# Patient Record
Sex: Male | Born: 1935
Health system: Southern US, Community
[De-identification: ages and names within clinical notes are randomized; demographics above are authoritative.]

## PROBLEM LIST (undated history)

## (undated) DIAGNOSIS — E079 Disorder of thyroid, unspecified: Secondary | ICD-10-CM

## (undated) DIAGNOSIS — N183 Chronic kidney disease, stage 3 (moderate): Secondary | ICD-10-CM

## (undated) DIAGNOSIS — E871 Hypo-osmolality and hyponatremia: Secondary | ICD-10-CM

## (undated) DIAGNOSIS — G20C Parkinsonism, unspecified: Secondary | ICD-10-CM

## (undated) DIAGNOSIS — I639 Cerebral infarction, unspecified: Secondary | ICD-10-CM

## (undated) DIAGNOSIS — M5430 Sciatica, unspecified side: Secondary | ICD-10-CM

## (undated) DIAGNOSIS — K219 Gastro-esophageal reflux disease without esophagitis: Secondary | ICD-10-CM

## (undated) DIAGNOSIS — F0391 Unspecified dementia with behavioral disturbance: Secondary | ICD-10-CM

## (undated) DIAGNOSIS — N189 Chronic kidney disease, unspecified: Secondary | ICD-10-CM

## (undated) DIAGNOSIS — L570 Actinic keratosis: Secondary | ICD-10-CM

## (undated) DIAGNOSIS — G20 Parkinson's disease: Secondary | ICD-10-CM

## (undated) DIAGNOSIS — R609 Edema, unspecified: Secondary | ICD-10-CM

## (undated) DIAGNOSIS — Z8719 Personal history of other diseases of the digestive system: Secondary | ICD-10-CM

## (undated) DIAGNOSIS — F329 Major depressive disorder, single episode, unspecified: Secondary | ICD-10-CM

## (undated) DIAGNOSIS — R269 Unspecified abnormalities of gait and mobility: Secondary | ICD-10-CM

## (undated) DIAGNOSIS — D649 Anemia, unspecified: Secondary | ICD-10-CM

## (undated) DIAGNOSIS — K5790 Diverticulosis of intestine, part unspecified, without perforation or abscess without bleeding: Secondary | ICD-10-CM

## (undated) DIAGNOSIS — E559 Vitamin D deficiency, unspecified: Secondary | ICD-10-CM

## (undated) DIAGNOSIS — F419 Anxiety disorder, unspecified: Secondary | ICD-10-CM

## (undated) DIAGNOSIS — M549 Dorsalgia, unspecified: Secondary | ICD-10-CM

## (undated) DIAGNOSIS — I10 Essential (primary) hypertension: Secondary | ICD-10-CM

## (undated) DIAGNOSIS — R202 Paresthesia of skin: Secondary | ICD-10-CM

## (undated) DIAGNOSIS — Q446 Cystic disease of liver: Secondary | ICD-10-CM

## (undated) DIAGNOSIS — I493 Ventricular premature depolarization: Secondary | ICD-10-CM

## (undated) HISTORY — DX: Parkinson's disease: G20

## (undated) HISTORY — DX: Parkinsonism, unspecified: G20.C

## (undated) HISTORY — PX: PROSTATE SURGERY: SHX751

## (undated) HISTORY — DX: Essential (primary) hypertension: I10

## (undated) HISTORY — DX: Ventricular premature depolarization: I49.3

## (undated) HISTORY — DX: Edema, unspecified: R60.9

## (undated) HISTORY — DX: Unspecified dementia with behavioral disturbance: F03.91

## (undated) HISTORY — DX: Cystic disease of liver: Q44.6

## (undated) HISTORY — DX: Anxiety disorder, unspecified: F41.9

## (undated) HISTORY — DX: Unspecified abnormalities of gait and mobility: R26.9

## (undated) HISTORY — PX: APPENDECTOMY: SHX54

## (undated) HISTORY — DX: Major depressive disorder, single episode, unspecified: F32.9

## (undated) HISTORY — DX: Actinic keratosis: L57.0

## (undated) HISTORY — DX: Hypo-osmolality and hyponatremia: E87.1

## (undated) HISTORY — DX: Diverticulosis of intestine, part unspecified, without perforation or abscess without bleeding: K57.90

## (undated) HISTORY — DX: Cerebral infarction, unspecified: I63.9

## (undated) HISTORY — DX: Anemia, unspecified: D64.9

## (undated) HISTORY — DX: Sciatica, unspecified side: M54.30

## (undated) HISTORY — DX: Disorder of thyroid, unspecified: E07.9

## (undated) HISTORY — DX: Gastro-esophageal reflux disease without esophagitis: K21.9

## (undated) HISTORY — PX: HERNIA REPAIR: SHX51

## (undated) HISTORY — DX: Chronic kidney disease, stage 3 (moderate): N18.3

## (undated) HISTORY — DX: Paresthesia of skin: R20.2

## (undated) HISTORY — DX: Vitamin D deficiency, unspecified: E55.9

## (undated) HISTORY — DX: Dorsalgia, unspecified: M54.9

---

## 2002-12-03 ENCOUNTER — Emergency Department (HOSPITAL_COMMUNITY): Admission: EM | Admit: 2002-12-03 | Discharge: 2002-12-03 | Payer: Self-pay | Admitting: Emergency Medicine

## 2002-12-03 ENCOUNTER — Encounter: Payer: Self-pay | Admitting: Emergency Medicine

## 2006-03-01 DIAGNOSIS — Z8719 Personal history of other diseases of the digestive system: Secondary | ICD-10-CM

## 2006-03-01 HISTORY — DX: Personal history of other diseases of the digestive system: Z87.19

## 2007-09-07 ENCOUNTER — Encounter: Admission: RE | Admit: 2007-09-07 | Discharge: 2007-09-07 | Payer: Self-pay | Admitting: General Surgery

## 2007-09-08 ENCOUNTER — Encounter: Admission: RE | Admit: 2007-09-08 | Discharge: 2007-09-08 | Payer: Self-pay | Admitting: General Surgery

## 2007-09-11 ENCOUNTER — Ambulatory Visit (HOSPITAL_BASED_OUTPATIENT_CLINIC_OR_DEPARTMENT_OTHER): Admission: RE | Admit: 2007-09-11 | Discharge: 2007-09-11 | Payer: Self-pay | Admitting: General Surgery

## 2008-03-01 DIAGNOSIS — I639 Cerebral infarction, unspecified: Secondary | ICD-10-CM

## 2008-03-01 HISTORY — DX: Cerebral infarction, unspecified: I63.9

## 2008-03-27 ENCOUNTER — Encounter: Admission: RE | Admit: 2008-03-27 | Discharge: 2008-03-27 | Payer: Self-pay | Admitting: Internal Medicine

## 2008-07-09 ENCOUNTER — Encounter: Admission: RE | Admit: 2008-07-09 | Discharge: 2008-07-09 | Payer: Self-pay | Admitting: Internal Medicine

## 2008-08-16 ENCOUNTER — Encounter: Admission: RE | Admit: 2008-08-16 | Discharge: 2008-08-16 | Payer: Self-pay | Admitting: Internal Medicine

## 2009-02-21 ENCOUNTER — Emergency Department (HOSPITAL_COMMUNITY): Admission: EM | Admit: 2009-02-21 | Discharge: 2009-02-21 | Payer: Self-pay | Admitting: Emergency Medicine

## 2010-03-23 ENCOUNTER — Encounter: Payer: Self-pay | Admitting: Internal Medicine

## 2010-07-14 NOTE — Op Note (Signed)
NAME:  STILLMAN, BUENGER NO.:  [id number]   MEDICAL RECORD NO.:  [id number]          PATIENT TYPE:  AMB   LOCATION:  DSC                          FACILITY:  [facility]   PHYSICIAN:  Angelia Mould. Derrell Lolling, M.D.DATE OF BIRTH:  06-11-1924   DATE OF PROCEDURE:  09/11/2007  DATE OF DISCHARGE:                               OPERATIVE REPORT   PREOPERATIVE DIAGNOSIS:  Left inguinal hernia.   POSTOPERATIVE DIAGNOSIS:  Left inguinal hernia.   OPERATION PERFORMED:  Repair of left inguinal hernia with mesh  Armanda Heritage repair).   SURGEON:  Dr. Claud Kelp.   OPERATIVE INDICATIONS:  This is an 77 year old white male who is  independent and lives at friend's home at [place].  He has a 35-month  history of a left groin pain and bulge.  This has been progressive.  On  exam, he has a moderate-size left inguinal hernia that is completely  reducible.  There is no evidence of hernia on the right.  There is a  scar in the right groin from previous hernia repair.  He was brought to  the operating room electively.   OPERATIVE TECHNIQUE:  Following induction of general LMA anesthesia the  patient's left groin was prepped and draped in sterile fashion.  The  patient was identified as to correct patient, correct procedure, and  correct site.  Intravenous antibiotics were given.  Marcaine 0.5% with  epinephrine local was used as a local infiltration anesthetic.   A slightly oblique transverse incision was made in the left inguinal  area overlying the left inguinal canal.  Dissection was carried down  through subcutaneous tissue exposing the aponeurosis of the external  oblique.  The external oblique was incised in the direction of its  fibers, opening up the external inguinal ring.  The external oblique was  dissected away from the underlying structures and self-retaining  retractors were placed.  A single sensory nerve was found to be  associated with the cord structures.  This nerve was  traced back to its  emergence from the muscle lateral to the internal ring, clamped,  divided, and ligated with 2-0 silk ties.  The redundant nerve medially  was excised.  The cord structures were mobilized and encircled with a  Penrose drain.  Cremasteric muscle fibers were skeletonized  circumferentially.   I found that he had a large indirect hernia.  There was a little bit of  omentum in the sac but it easily reduced.  He also had somewhat of a  bulge in the medial to the inferior epigastric vessels suggesting that  the also had a small component of a direct hernia.   The indirect sac was dissected back to the level of the internal  inguinal ring.  The sac was then twisted and it suture ligated at the  level of the internal ring with a suture ligature of 2-0 Vicryl.  The  redundant sac was excised.   The floor of the inguinal canal was repaired, reinforced with an [place]  graft of Ultrapro mesh.  A 3 inches x 6 inches piece of Ultrapro  mesh  was used, trimmed at the corners, and sutured in place with running  sutures and interrupted sutures of 2-0 Prolene.  The mesh was sutured so  as to generously overlap the fascia at the pubic tubercle, along the  inguinal ligament inferiorly.  Medially and superomedially about 6  interrupted sutures of 2-0 Prolene were placed to secure the mesh.  Superolaterally, a running suture of 2-0 Prolene was used.  The mesh was  incised laterally so as to wrap around the cord structures of the  internal ring.  Laterally, the tails of the mesh were overlapped and the  suture line was completed.  One of the mattress suture of 2-0 Prolene  was placed just lateral to the cord structures to tighten up the repair  around the internal ring.  This provided a very secure repair both  medial and lateral to the cord structures, but allowed a fingertip  opening for the cord structures to emerge.  The wound was irrigated with  saline.  The external oblique was closed  with running suture of 2-0  Vicryl placing the cord structures deep to the external oblique.  Scarpa  fascia was closed with 3-0 Vicryl suture and the skin was closed with  running subcuticular suture of 4-0 Monocryl and Dermabond.  Clean  bandages were placed and the patient was taken recovery room in stable  condition.  Estimated blood loss was about 10 mL.  Complications none.  Sponge, needle, and instrument counts were correct.      Angelia Mould. Derrell Lolling, M.D.  Electronically Signed     HMI/MEDQ  D:  09/11/2007  T:  09/12/2007  Job:  [id number]   cc:   Lenon Curt Chilton Si, M.D.

## 2010-11-10 ENCOUNTER — Encounter (INDEPENDENT_AMBULATORY_CARE_PROVIDER_SITE_OTHER): Payer: Self-pay | Admitting: General Surgery

## 2010-11-12 ENCOUNTER — Ambulatory Visit (INDEPENDENT_AMBULATORY_CARE_PROVIDER_SITE_OTHER): Payer: Medicare Other | Admitting: General Surgery

## 2010-11-12 ENCOUNTER — Encounter (INDEPENDENT_AMBULATORY_CARE_PROVIDER_SITE_OTHER): Payer: Self-pay | Admitting: General Surgery

## 2010-11-12 VITALS — BP 132/72 | HR 88 | Temp 97.7°F | Ht 63.0 in | Wt 126.4 lb

## 2010-11-12 DIAGNOSIS — R1031 Right lower quadrant pain: Secondary | ICD-10-CM

## 2010-11-12 NOTE — Progress Notes (Signed)
Chief Complaint  Patient presents with  . Other    new pt -eval of right inguinal hernia    HPI Vincent Potts is a 77 y.o. male HPI The patient is referred by Dr. Chilton Si for evaluation of right-sided groin pain. He has a history of bilateral inguinal hernia repair as well as an open appendectomy. He had a right-sided hernia repair many years ago and is unsure if any mesh was included in the repair. The last 2-3 weeks he has noted some occasional discomfort in the right groin which he describes as "sharp, and burning". He states it is worse when going to the bathroom.He has not noticed any bulge in the region and states that his bowels are okay. He did have a colonoscopy 4-5 years ago which he states was normal. Past Medical History  Diagnosis Date  . Thyroid disease   . Anemia   . Vitamin D deficiency disease   . Hyponatremia   . Anxiety   . Parkinsonism   . Hearing loss   . Hypertension   . PVC (premature ventricular contraction)   . CVA (cerebral infarction)   . GERD (gastroesophageal reflux disease)   . Diverticulosis   . Sciatica   . Back pain   . Gait disorder   . Cataract   . Edema   . Keratosis   . Sciatica   . Cystic disease of liver   . Paresthesia   . Inguinal hernia   . Hearing loss     Past Surgical History  Procedure Date  . Prostate surgery     cancer  . Hernia repair   . Appendectomy     History reviewed. No pertinent family history.  Social History History  Substance Use Topics  . Smoking status: Never Smoker   . Smokeless tobacco: Not on file  . Alcohol Use: No    Allergies  Allergen Reactions  . Rivastigmine Nausea Only  . Zyprexa     Current Outpatient Prescriptions  Medication Sig Dispense Refill  . amLODipine (NORVASC) 5 MG tablet Take 5 mg by mouth daily.        Marland Kitchen aspirin 325 MG tablet Take 325 mg by mouth daily.        . cholecalciferol (VITAMIN D) 1000 UNITS tablet Take 1,000 Units by mouth daily.        . cycloSPORINE  (RESTASIS) 0.05 % ophthalmic emulsion 1 drop 2 (two) times daily.        . ferrous sulfate 325 (65 FE) MG tablet Take 325 mg by mouth daily with breakfast.        . lisinopril (PRINIVIL,ZESTRIL) 10 MG tablet Take 10 mg by mouth daily.        Marland Kitchen omeprazole (PRILOSEC) 20 MG capsule Take 20 mg by mouth daily.        Marland Kitchen senna (SENOKOT) 8.6 MG tablet Take 1 tablet by mouth daily.        . Tamsulosin HCl (FLOMAX) 0.4 MG CAPS Take 0.4 mg by mouth.        . temazepam (RESTORIL) 30 MG capsule Take 30 mg by mouth at bedtime as needed.          Review of Systems Review of Systems  HENT: Positive for hearing loss.   All other systems reviewed and are negative.    Blood pressure 132/72, pulse 88, temperature 97.7 F (36.5 C), height 5\' 3"  (1.6 m), weight 126 lb 6 oz (57.323 kg).  Physical Exam Physical Exam  Constitutional: He is oriented to person, place, and time. He appears well-developed and well-nourished. No distress.  HENT:  Head: Normocephalic and atraumatic.  Eyes: Conjunctivae and EOM are normal. Pupils are equal, round, and reactive to light. Right eye exhibits no discharge. Left eye exhibits no discharge. No scleral icterus.  Neck: Normal range of motion. Neck supple. No tracheal deviation present.  Cardiovascular: Normal rate, regular rhythm and normal heart sounds.   Pulmonary/Chest: Effort normal and breath sounds normal. No stridor. No respiratory distress. He has no wheezes.  Abdominal: Soft. Bowel sounds are normal. He exhibits no distension and no mass. There is no tenderness. There is no rebound and no guarding.       bilat inguinal hernia incisions, lower midline incision without evidence of hernia   Genitourinary: Penis normal.       He has well-healed surgical scars in both groins but I did not feel any evidence of hernia with Valsalva bilaterally. He does have some tenderness in the epididymis region and his cord but again no evidence of hernia or testicular masses.    Musculoskeletal: Normal range of motion.  Neurological: He is alert and oriented to person, place, and time.  Skin: Skin is warm and dry. No rash noted. He is not diaphoretic. No erythema. No pallor.  Psychiatric: He has a normal mood and affect. His behavior is normal. Judgment and thought content normal.    Data Reviewed   Assessment    Right groin pain  I do not appreciate on exam any evidence of recurrent hernia bilaterally. He does have pain in the area which could be consistent with a recurrent inguinal hernia, however, I cannot feel any bulge or defect with Valsalva. He does have some tenderness in the epididymal region and this could be testicular or epididymitis as well. I have recommended ultrasound of the groin to evaluate for possible hernia but I would be reluctant to offer any surgical treatment without more definitive evidence of a hernia.    Plan    He will complete this ultrasound of the right groin to evaluate for possible hernia and he will follow up after this. If he has a hernia on ultrasound, and then we will discuss possible surgical intervention but given his age and functional status, I would be reluctant to offer any surgery in this there is better evidence indicating a hernia.       Lodema Pilot DAVID 11/12/2010, 12:06 PM

## 2010-11-16 ENCOUNTER — Other Ambulatory Visit (INDEPENDENT_AMBULATORY_CARE_PROVIDER_SITE_OTHER): Payer: Self-pay | Admitting: General Surgery

## 2010-11-16 ENCOUNTER — Ambulatory Visit
Admission: RE | Admit: 2010-11-16 | Discharge: 2010-11-16 | Disposition: A | Discharge status: Home / Self Care | Payer: Medicare Other | Source: Ambulatory Visit | Attending: General Surgery | Admitting: General Surgery

## 2010-11-16 DIAGNOSIS — R1031 Right lower quadrant pain: Secondary | ICD-10-CM

## 2010-11-19 ENCOUNTER — Ambulatory Visit (INDEPENDENT_AMBULATORY_CARE_PROVIDER_SITE_OTHER): Payer: Medicare Other | Admitting: General Surgery

## 2010-11-19 ENCOUNTER — Encounter (INDEPENDENT_AMBULATORY_CARE_PROVIDER_SITE_OTHER): Payer: Self-pay | Admitting: General Surgery

## 2010-11-19 VITALS — BP 144/84 | HR 88 | Temp 98.6°F | Resp 16 | Ht 62.5 in | Wt 129.4 lb

## 2010-11-19 DIAGNOSIS — R1031 Right lower quadrant pain: Secondary | ICD-10-CM

## 2010-11-19 NOTE — Progress Notes (Signed)
Subjective:     Patient ID: Vincent Potts, male   DOB: 15-Jun-1924, 77 y.o.   MRN: [id number]  HPI Patient follows up with ultrasound of the right groin for evaluation of possible right inguinal hernia. He states that he still has occasional discomfort in the groin but this is relieved with Tylenol twice a day. Ultrasound was negative for any abdominal defects.  Review of Systems     Objective:   Physical Exam No acute distress and nontoxic-appearing  His abdomen is soft and nontender on exam. I reexamined him today and I again did not feel any evidence of hernia in the groin or at the bottom of his lower midline incision. There is no bulge with Valsalva. It is any apparent tenderness on exam today.at    Assessment:     Right groin pain  I do not see any evidence of hernia.  I would not recommend any surgical intervention at this time. This is not limiting his lifestyle and without any objective evidence confirming the hernia I would not recommend surgical intervention.    Plan:     If he notices a bulge in the area, I recommended that he return for evaluation so we can confirm the presence of a hernia. If he returns with physical exam consistent with a hernia then we will be happy to reconsider surgical intervention.

## 2010-11-26 LAB — DIFFERENTIAL
Basophils Absolute: 0
Basophils Relative: 0
Eosinophils Relative: 2
Lymphocytes Relative: 25
Monocytes Absolute: 0.6
Neutro Abs: 7.4

## 2010-11-26 LAB — CBC
HCT: 37.7 — ABNORMAL LOW
MCV: 91.5
RBC: 4.12 — ABNORMAL LOW
WBC: 10.9 — ABNORMAL HIGH

## 2010-11-26 LAB — COMPREHENSIVE METABOLIC PANEL
AST: 23
Albumin: 3.8
Alkaline Phosphatase: 63
BUN: 18
CO2: 28
Chloride: 94 — ABNORMAL LOW
Creatinine, Ser: 1.23
GFR calc non Af Amer: 56 — ABNORMAL LOW
Potassium: 4.3
Total Bilirubin: 0.8

## 2010-11-26 LAB — POCT HEMOGLOBIN-HEMACUE: Hemoglobin: 13.3

## 2010-11-26 LAB — URINALYSIS, ROUTINE W REFLEX MICROSCOPIC
Glucose, UA: NEGATIVE
Hgb urine dipstick: NEGATIVE
Specific Gravity, Urine: 1.015
pH: 7

## 2011-03-02 DIAGNOSIS — N189 Chronic kidney disease, unspecified: Secondary | ICD-10-CM

## 2011-03-02 HISTORY — DX: Chronic kidney disease, unspecified: N18.9

## 2011-03-29 DIAGNOSIS — D649 Anemia, unspecified: Secondary | ICD-10-CM | POA: Diagnosis not present

## 2011-03-29 DIAGNOSIS — I10 Essential (primary) hypertension: Secondary | ICD-10-CM | POA: Diagnosis not present

## 2011-04-06 DIAGNOSIS — K4090 Unilateral inguinal hernia, without obstruction or gangrene, not specified as recurrent: Secondary | ICD-10-CM | POA: Diagnosis not present

## 2011-04-06 DIAGNOSIS — N401 Enlarged prostate with lower urinary tract symptoms: Secondary | ICD-10-CM | POA: Diagnosis not present

## 2011-04-06 DIAGNOSIS — R21 Rash and other nonspecific skin eruption: Secondary | ICD-10-CM | POA: Diagnosis not present

## 2011-04-06 DIAGNOSIS — R351 Nocturia: Secondary | ICD-10-CM | POA: Diagnosis not present

## 2011-04-08 DIAGNOSIS — R351 Nocturia: Secondary | ICD-10-CM | POA: Diagnosis not present

## 2011-04-08 DIAGNOSIS — N401 Enlarged prostate with lower urinary tract symptoms: Secondary | ICD-10-CM | POA: Diagnosis not present

## 2011-04-08 DIAGNOSIS — R350 Frequency of micturition: Secondary | ICD-10-CM | POA: Diagnosis not present

## 2011-04-18 ENCOUNTER — Encounter (HOSPITAL_COMMUNITY): Payer: Self-pay

## 2011-04-18 ENCOUNTER — Other Ambulatory Visit: Payer: Self-pay

## 2011-04-18 ENCOUNTER — Emergency Department (HOSPITAL_COMMUNITY): Payer: Medicare Other

## 2011-04-18 ENCOUNTER — Inpatient Hospital Stay (HOSPITAL_COMMUNITY)
Admission: EM | Admit: 2011-04-18 | Discharge: 2011-04-23 | DRG: 557 | Disposition: A | Discharge status: Skilled Nursing Facility | Payer: Medicare Other | Attending: Internal Medicine | Admitting: Internal Medicine

## 2011-04-18 DIAGNOSIS — M6282 Rhabdomyolysis: Principal | ICD-10-CM | POA: Diagnosis present

## 2011-04-18 DIAGNOSIS — R748 Abnormal levels of other serum enzymes: Secondary | ICD-10-CM | POA: Diagnosis present

## 2011-04-18 DIAGNOSIS — E46 Unspecified protein-calorie malnutrition: Secondary | ICD-10-CM | POA: Diagnosis present

## 2011-04-18 DIAGNOSIS — W010XXA Fall on same level from slipping, tripping and stumbling without subsequent striking against object, initial encounter: Secondary | ICD-10-CM | POA: Diagnosis present

## 2011-04-18 DIAGNOSIS — N179 Acute kidney failure, unspecified: Secondary | ICD-10-CM | POA: Diagnosis present

## 2011-04-18 DIAGNOSIS — D649 Anemia, unspecified: Secondary | ICD-10-CM

## 2011-04-18 DIAGNOSIS — G20A1 Parkinson's disease without dyskinesia, without mention of fluctuations: Secondary | ICD-10-CM | POA: Diagnosis present

## 2011-04-18 DIAGNOSIS — H9190 Unspecified hearing loss, unspecified ear: Secondary | ICD-10-CM | POA: Diagnosis present

## 2011-04-18 DIAGNOSIS — R4182 Altered mental status, unspecified: Secondary | ICD-10-CM | POA: Diagnosis not present

## 2011-04-18 DIAGNOSIS — M549 Dorsalgia, unspecified: Secondary | ICD-10-CM

## 2011-04-18 DIAGNOSIS — I6350 Cerebral infarction due to unspecified occlusion or stenosis of unspecified cerebral artery: Secondary | ICD-10-CM | POA: Diagnosis not present

## 2011-04-18 DIAGNOSIS — Z7982 Long term (current) use of aspirin: Secondary | ICD-10-CM

## 2011-04-18 DIAGNOSIS — E876 Hypokalemia: Secondary | ICD-10-CM | POA: Diagnosis present

## 2011-04-18 DIAGNOSIS — Z8719 Personal history of other diseases of the digestive system: Secondary | ICD-10-CM

## 2011-04-18 DIAGNOSIS — Z888 Allergy status to other drugs, medicaments and biological substances status: Secondary | ICD-10-CM

## 2011-04-18 DIAGNOSIS — K219 Gastro-esophageal reflux disease without esophagitis: Secondary | ICD-10-CM

## 2011-04-18 DIAGNOSIS — G20 Parkinson's disease: Secondary | ICD-10-CM | POA: Diagnosis present

## 2011-04-18 DIAGNOSIS — Z8673 Personal history of transient ischemic attack (TIA), and cerebral infarction without residual deficits: Secondary | ICD-10-CM

## 2011-04-18 DIAGNOSIS — I10 Essential (primary) hypertension: Secondary | ICD-10-CM | POA: Diagnosis present

## 2011-04-18 DIAGNOSIS — Z79899 Other long term (current) drug therapy: Secondary | ICD-10-CM

## 2011-04-18 DIAGNOSIS — R918 Other nonspecific abnormal finding of lung field: Secondary | ICD-10-CM | POA: Diagnosis not present

## 2011-04-18 DIAGNOSIS — T148XXA Other injury of unspecified body region, initial encounter: Secondary | ICD-10-CM | POA: Diagnosis not present

## 2011-04-18 DIAGNOSIS — R945 Abnormal results of liver function studies: Secondary | ICD-10-CM | POA: Diagnosis present

## 2011-04-18 DIAGNOSIS — D72829 Elevated white blood cell count, unspecified: Secondary | ICD-10-CM | POA: Diagnosis present

## 2011-04-18 DIAGNOSIS — W19XXXA Unspecified fall, initial encounter: Secondary | ICD-10-CM | POA: Diagnosis present

## 2011-04-18 DIAGNOSIS — I639 Cerebral infarction, unspecified: Secondary | ICD-10-CM | POA: Diagnosis present

## 2011-04-18 DIAGNOSIS — D638 Anemia in other chronic diseases classified elsewhere: Secondary | ICD-10-CM | POA: Diagnosis present

## 2011-04-18 DIAGNOSIS — J189 Pneumonia, unspecified organism: Secondary | ICD-10-CM | POA: Diagnosis present

## 2011-04-18 DIAGNOSIS — S298XXA Other specified injuries of thorax, initial encounter: Secondary | ICD-10-CM | POA: Diagnosis not present

## 2011-04-18 DIAGNOSIS — K5790 Diverticulosis of intestine, part unspecified, without perforation or abscess without bleeding: Secondary | ICD-10-CM

## 2011-04-18 DIAGNOSIS — R269 Unspecified abnormalities of gait and mobility: Secondary | ICD-10-CM | POA: Diagnosis present

## 2011-04-18 LAB — PROTIME-INR
INR: 1.1 (ref 0.00–1.49)
Prothrombin Time: 14.4 seconds (ref 11.6–15.2)

## 2011-04-18 LAB — URINE MICROSCOPIC-ADD ON

## 2011-04-18 LAB — CK TOTAL AND CKMB (NOT AT ARMC)
CK, MB: 121.5 ng/mL (ref 0.3–4.0)
Relative Index: 1.1 (ref 0.0–2.5)
Total CK: 11187 U/L — ABNORMAL HIGH (ref 7–232)

## 2011-04-18 LAB — URINALYSIS, ROUTINE W REFLEX MICROSCOPIC
Bilirubin Urine: NEGATIVE
Glucose, UA: NEGATIVE mg/dL
Specific Gravity, Urine: 1.015 (ref 1.005–1.030)
Urobilinogen, UA: 0.2 mg/dL (ref 0.0–1.0)

## 2011-04-18 LAB — COMPREHENSIVE METABOLIC PANEL
ALT: 73 U/L — ABNORMAL HIGH (ref 0–53)
AST: 226 U/L — ABNORMAL HIGH (ref 0–37)
Albumin: 3.2 g/dL — ABNORMAL LOW (ref 3.5–5.2)
Alkaline Phosphatase: 67 U/L (ref 39–117)
Calcium: 9.6 mg/dL (ref 8.4–10.5)
GFR calc Af Amer: 17 mL/min — ABNORMAL LOW (ref >90)
Potassium: 3.4 mEq/L — ABNORMAL LOW (ref 3.5–5.1)
Sodium: 143 mEq/L (ref 135–145)
Total Protein: 6.3 g/dL (ref 6.0–8.3)

## 2011-04-18 LAB — CBC
Hemoglobin: 9.2 g/dL — ABNORMAL LOW (ref 13.0–17.0)
MCH: 30.5 pg (ref 26.0–34.0)
MCHC: 35.8 g/dL (ref 30.0–36.0)
Platelets: 267 10*3/uL (ref 150–400)

## 2011-04-18 LAB — DIFFERENTIAL
Basophils Absolute: 0 10*3/uL (ref 0.0–0.1)
Basophils Relative: 0 % (ref 0–1)
Eosinophils Absolute: 0 10*3/uL (ref 0.0–0.7)
Lymphocytes Relative: 6 % — ABNORMAL LOW (ref 12–46)
Monocytes Absolute: 0.5 10*3/uL (ref 0.1–1.0)
Neutrophils Relative %: 90 % — ABNORMAL HIGH (ref 43–77)

## 2011-04-18 LAB — PROCALCITONIN: Procalcitonin: 0.79 ng/mL

## 2011-04-18 MED ORDER — PIPERACILLIN-TAZOBACTAM 3.375 G IVPB
3.3750 g | Freq: Once | INTRAVENOUS | Status: AC
  Administered 2011-04-18: 3.375 g via INTRAVENOUS
  Filled 2011-04-18: qty 50

## 2011-04-18 MED ORDER — SODIUM CHLORIDE 0.9 % IV SOLN: INTRAVENOUS | Status: AC

## 2011-04-18 MED ORDER — HYDROMORPHONE HCL PF 1 MG/ML IJ SOLN: 0.5000 mg | INTRAMUSCULAR | Status: DC | PRN

## 2011-04-18 MED ORDER — ALUM & MAG HYDROXIDE-SIMETH 200-200-20 MG/5ML PO SUSP
30.0000 mL | Freq: Four times a day (QID) | ORAL | Status: DC | PRN
  Filled 2011-04-18: qty 30

## 2011-04-18 MED ORDER — SODIUM CHLORIDE 0.9 % IV SOLN: INTRAVENOUS | Status: DC

## 2011-04-18 MED ORDER — VANCOMYCIN HCL IN DEXTROSE 1-5 GM/200ML-% IV SOLN
1000.0000 mg | Freq: Once | INTRAVENOUS | Status: AC
  Administered 2011-04-19: 1000 mg via INTRAVENOUS
  Filled 2011-04-18: qty 200

## 2011-04-18 MED ORDER — ACETAMINOPHEN 650 MG RE SUPP: 650.0000 mg | Freq: Four times a day (QID) | RECTAL | Status: DC | PRN

## 2011-04-18 MED ORDER — ACETAMINOPHEN 325 MG PO TABS
650.0000 mg | ORAL_TABLET | Freq: Four times a day (QID) | ORAL | Status: DC | PRN
Start: 2011-04-18 — End: 2011-04-23

## 2011-04-18 MED ORDER — ONDANSETRON HCL 4 MG PO TABS: 4.0000 mg | ORAL_TABLET | Freq: Four times a day (QID) | ORAL | Status: DC | PRN

## 2011-04-18 MED ORDER — SODIUM CHLORIDE 0.9 % IV BOLUS (SEPSIS)
1000.0000 mL | Freq: Once | INTRAVENOUS | Status: AC
  Administered 2011-04-18: 1000 mL via INTRAVENOUS

## 2011-04-18 MED ORDER — ONDANSETRON HCL 4 MG/2ML IJ SOLN: 4.0000 mg | Freq: Four times a day (QID) | INTRAMUSCULAR | Status: DC | PRN

## 2011-04-18 MED ORDER — SODIUM CHLORIDE 0.9 % IJ SOLN
3.0000 mL | Freq: Two times a day (BID) | INTRAMUSCULAR | Status: DC
  Administered 2011-04-19 – 2011-04-23 (×3): 3 mL via INTRAVENOUS

## 2011-04-18 MED ORDER — ENOXAPARIN SODIUM 40 MG/0.4ML ~~LOC~~ SOLN
40.0000 mg | SUBCUTANEOUS | Status: DC
  Filled 2011-04-18: qty 0.4

## 2011-04-18 MED ORDER — OXYCODONE HCL 5 MG PO TABS
5.0000 mg | ORAL_TABLET | ORAL | Status: DC | PRN
  Administered 2011-04-20: 5 mg via ORAL
  Filled 2011-04-18: qty 1

## 2011-04-18 MED ORDER — ZOLPIDEM TARTRATE 5 MG PO TABS
5.0000 mg | ORAL_TABLET | Freq: Every evening | ORAL | Status: DC | PRN
  Administered 2011-04-23: 5 mg via ORAL
  Filled 2011-04-18 (×2): qty 1

## 2011-04-18 MED ORDER — ALBUTEROL SULFATE (5 MG/ML) 0.5% IN NEBU
2.5000 mg | INHALATION_SOLUTION | Freq: Four times a day (QID) | RESPIRATORY_TRACT | Status: DC
  Administered 2011-04-19: 2.5 mg via RESPIRATORY_TRACT
  Filled 2011-04-18: qty 0.5

## 2011-04-18 NOTE — ED Notes (Signed)
Received bedside report from [place], [place].  Patient currently resting quietly in bed; no respiratory or acute distress noted.  Patient responsive to verbal stimuli; lab tech at bedside to collect blood cultures -- will wait till lab tech is finished before administering antibiotics.  Will continue to monitor.

## 2011-04-18 NOTE — ED Notes (Signed)
Pt lives at [facility] - independent living; had not been seen since yesterday and was found on the floor today. Pt is alert

## 2011-04-18 NOTE — ED Notes (Signed)
Lab at bedside to collect cardiac panel labs.

## 2011-04-18 NOTE — ED Notes (Signed)
Pt extremely emaciated

## 2011-04-18 NOTE — ED Notes (Signed)
Notifed Rancour, MD re: critical levels CKMB (121.5) & troponin (0.51)

## 2011-04-19 DIAGNOSIS — M6282 Rhabdomyolysis: Principal | ICD-10-CM | POA: Diagnosis present

## 2011-04-19 DIAGNOSIS — J189 Pneumonia, unspecified organism: Secondary | ICD-10-CM | POA: Diagnosis present

## 2011-04-19 DIAGNOSIS — E46 Unspecified protein-calorie malnutrition: Secondary | ICD-10-CM | POA: Diagnosis present

## 2011-04-19 DIAGNOSIS — E876 Hypokalemia: Secondary | ICD-10-CM | POA: Diagnosis present

## 2011-04-19 DIAGNOSIS — Z888 Allergy status to other drugs, medicaments and biological substances status: Secondary | ICD-10-CM | POA: Diagnosis not present

## 2011-04-19 DIAGNOSIS — Z79899 Other long term (current) drug therapy: Secondary | ICD-10-CM | POA: Diagnosis not present

## 2011-04-19 DIAGNOSIS — R269 Unspecified abnormalities of gait and mobility: Secondary | ICD-10-CM | POA: Insufficient documentation

## 2011-04-19 DIAGNOSIS — Z7982 Long term (current) use of aspirin: Secondary | ICD-10-CM | POA: Diagnosis not present

## 2011-04-19 DIAGNOSIS — H9190 Unspecified hearing loss, unspecified ear: Secondary | ICD-10-CM | POA: Insufficient documentation

## 2011-04-19 DIAGNOSIS — K2100 Gastro-esophageal reflux disease with esophagitis, without bleeding: Secondary | ICD-10-CM | POA: Diagnosis not present

## 2011-04-19 DIAGNOSIS — R4182 Altered mental status, unspecified: Secondary | ICD-10-CM | POA: Diagnosis not present

## 2011-04-19 DIAGNOSIS — I679 Cerebrovascular disease, unspecified: Secondary | ICD-10-CM | POA: Diagnosis not present

## 2011-04-19 DIAGNOSIS — I10 Essential (primary) hypertension: Secondary | ICD-10-CM | POA: Insufficient documentation

## 2011-04-19 DIAGNOSIS — D696 Thrombocytopenia, unspecified: Secondary | ICD-10-CM | POA: Diagnosis not present

## 2011-04-19 DIAGNOSIS — M549 Dorsalgia, unspecified: Secondary | ICD-10-CM | POA: Insufficient documentation

## 2011-04-19 DIAGNOSIS — G20 Parkinson's disease: Secondary | ICD-10-CM | POA: Diagnosis present

## 2011-04-19 DIAGNOSIS — R471 Dysarthria and anarthria: Secondary | ICD-10-CM | POA: Diagnosis not present

## 2011-04-19 DIAGNOSIS — M545 Low back pain, unspecified: Secondary | ICD-10-CM | POA: Diagnosis not present

## 2011-04-19 DIAGNOSIS — K219 Gastro-esophageal reflux disease without esophagitis: Secondary | ICD-10-CM | POA: Insufficient documentation

## 2011-04-19 DIAGNOSIS — Z8719 Personal history of other diseases of the digestive system: Secondary | ICD-10-CM | POA: Diagnosis not present

## 2011-04-19 DIAGNOSIS — Z8673 Personal history of transient ischemic attack (TIA), and cerebral infarction without residual deficits: Secondary | ICD-10-CM | POA: Diagnosis not present

## 2011-04-19 DIAGNOSIS — N179 Acute kidney failure, unspecified: Secondary | ICD-10-CM | POA: Diagnosis present

## 2011-04-19 DIAGNOSIS — D72829 Elevated white blood cell count, unspecified: Secondary | ICD-10-CM | POA: Diagnosis present

## 2011-04-19 DIAGNOSIS — T795XXA Traumatic anuria, initial encounter: Secondary | ICD-10-CM | POA: Diagnosis not present

## 2011-04-19 DIAGNOSIS — D638 Anemia in other chronic diseases classified elsewhere: Secondary | ICD-10-CM | POA: Diagnosis present

## 2011-04-19 DIAGNOSIS — W010XXA Fall on same level from slipping, tripping and stumbling without subsequent striking against object, initial encounter: Secondary | ICD-10-CM | POA: Diagnosis not present

## 2011-04-19 DIAGNOSIS — I639 Cerebral infarction, unspecified: Secondary | ICD-10-CM | POA: Diagnosis present

## 2011-04-19 DIAGNOSIS — K5790 Diverticulosis of intestine, part unspecified, without perforation or abscess without bleeding: Secondary | ICD-10-CM | POA: Insufficient documentation

## 2011-04-19 DIAGNOSIS — R748 Abnormal levels of other serum enzymes: Secondary | ICD-10-CM | POA: Diagnosis present

## 2011-04-19 DIAGNOSIS — M6281 Muscle weakness (generalized): Secondary | ICD-10-CM | POA: Diagnosis not present

## 2011-04-19 DIAGNOSIS — D649 Anemia, unspecified: Secondary | ICD-10-CM | POA: Diagnosis present

## 2011-04-19 DIAGNOSIS — W19XXXA Unspecified fall, initial encounter: Secondary | ICD-10-CM | POA: Diagnosis present

## 2011-04-19 DIAGNOSIS — R945 Abnormal results of liver function studies: Secondary | ICD-10-CM | POA: Diagnosis present

## 2011-04-19 LAB — CARDIAC PANEL(CRET KIN+CKTOT+MB+TROPI)
CK, MB: 118.6 ng/mL (ref 0.3–4.0)
CK, MB: 94.7 ng/mL (ref 0.3–4.0)
CK, MB: 62.2 ng/mL (ref 0.3–4.0)
CK, MB: 47.7 ng/mL (ref 0.3–4.0)
Relative Index: 1.1 (ref 0.0–2.5)
Total CK: 10076 U/L — ABNORMAL HIGH (ref 7–232)
Troponin I: 0.33 ng/mL (ref <0.30)
Troponin I: 0.36 ng/mL (ref <0.30)
Troponin I: 0.37 ng/mL (ref <0.30)
Troponin I: 0.41 ng/mL (ref <0.30)

## 2011-04-19 LAB — CBC
HCT: 25.5 % — ABNORMAL LOW (ref 39.0–52.0)
MCHC: 34.5 g/dL (ref 30.0–36.0)
MCV: 85.6 fL (ref 78.0–100.0)
RDW: 15.7 % — ABNORMAL HIGH (ref 11.5–15.5)

## 2011-04-19 LAB — BASIC METABOLIC PANEL
BUN: 68 mg/dL — ABNORMAL HIGH (ref 6–23)
CO2: 16 mEq/L — ABNORMAL LOW (ref 19–32)
Chloride: 115 mEq/L — ABNORMAL HIGH (ref 96–112)
Creatinine, Ser: 3.13 mg/dL — ABNORMAL HIGH (ref 0.50–1.35)

## 2011-04-19 LAB — FOLATE: Folate: 17.2 ng/mL

## 2011-04-19 LAB — FERRITIN: Ferritin: 173 ng/mL (ref 22–322)

## 2011-04-19 MED ORDER — ALUM & MAG HYDROXIDE-SIMETH 200-200-20 MG/5ML PO SUSP: 30.0000 mL | Freq: Four times a day (QID) | ORAL | Status: DC | PRN

## 2011-04-19 MED ORDER — TAMSULOSIN HCL 0.4 MG PO CAPS
0.4000 mg | ORAL_CAPSULE | Freq: Every morning | ORAL | Status: DC
  Administered 2011-04-19 – 2011-04-23 (×5): 0.4 mg via ORAL
  Filled 2011-04-19 (×5): qty 1

## 2011-04-19 MED ORDER — ENSURE CLINICAL ST REVIGOR PO LIQD
237.0000 mL | Freq: Two times a day (BID) | ORAL | Status: DC
  Administered 2011-04-19 – 2011-04-21 (×4): via ORAL
  Administered 2011-04-21 – 2011-04-23 (×4): 237 mL via ORAL

## 2011-04-19 MED ORDER — HYDROMORPHONE HCL PF 1 MG/ML IJ SOLN: 0.5000 mg | INTRAMUSCULAR | Status: DC | PRN

## 2011-04-19 MED ORDER — PIPERACILLIN-TAZOBACTAM IN DEX 2-0.25 GM/50ML IV SOLN
2.2500 g | Freq: Four times a day (QID) | INTRAVENOUS | Status: DC
  Administered 2011-04-19 – 2011-04-20 (×6): 2.25 g via INTRAVENOUS
  Filled 2011-04-19 (×9): qty 50

## 2011-04-19 MED ORDER — SENNA 8.6 MG PO TABS
1.0000 | ORAL_TABLET | Freq: Two times a day (BID) | ORAL | Status: DC
  Administered 2011-04-19 – 2011-04-23 (×8): 8.6 mg via ORAL
  Filled 2011-04-19 (×12): qty 1

## 2011-04-19 MED ORDER — ACETAMINOPHEN 325 MG PO TABS: 650.0000 mg | ORAL_TABLET | Freq: Four times a day (QID) | ORAL | Status: DC | PRN

## 2011-04-19 MED ORDER — SODIUM CHLORIDE 0.9 % IV SOLN
750.0000 mg | INTRAVENOUS | Status: DC
  Filled 2011-04-19: qty 750

## 2011-04-19 MED ORDER — AMLODIPINE BESYLATE 5 MG PO TABS
5.0000 mg | ORAL_TABLET | Freq: Every morning | ORAL | Status: DC
  Administered 2011-04-19 – 2011-04-23 (×5): 5 mg via ORAL
  Filled 2011-04-19 (×5): qty 1

## 2011-04-19 MED ORDER — ALBUTEROL SULFATE (5 MG/ML) 0.5% IN NEBU
2.5000 mg | INHALATION_SOLUTION | Freq: Four times a day (QID) | RESPIRATORY_TRACT | Status: DC
  Administered 2011-04-19 – 2011-04-21 (×8): 2.5 mg via RESPIRATORY_TRACT
  Filled 2011-04-19 (×9): qty 0.5

## 2011-04-19 MED ORDER — OXYCODONE HCL 5 MG PO TABS: 5.0000 mg | ORAL_TABLET | ORAL | Status: DC | PRN

## 2011-04-19 MED ORDER — ACETAMINOPHEN 650 MG RE SUPP: 650.0000 mg | Freq: Four times a day (QID) | RECTAL | Status: DC | PRN

## 2011-04-19 MED ORDER — FERROUS SULFATE 325 (65 FE) MG PO TABS
325.0000 mg | ORAL_TABLET | Freq: Every day | ORAL | Status: DC
  Administered 2011-04-19 – 2011-04-23 (×5): 325 mg via ORAL
  Filled 2011-04-19 (×7): qty 1

## 2011-04-19 MED ORDER — BIOTENE DRY MOUTH MT LIQD
15.0000 mL | Freq: Two times a day (BID) | OROMUCOSAL | Status: DC
  Administered 2011-04-19 – 2011-04-23 (×9): 15 mL via OROMUCOSAL

## 2011-04-19 MED ORDER — SODIUM CHLORIDE 0.9 % IJ SOLN: 3.0000 mL | Freq: Two times a day (BID) | INTRAMUSCULAR | Status: DC

## 2011-04-19 MED ORDER — ALBUTEROL SULFATE (5 MG/ML) 0.5% IN NEBU: 2.5000 mg | INHALATION_SOLUTION | RESPIRATORY_TRACT | Status: DC | PRN

## 2011-04-19 MED ORDER — CYCLOSPORINE 0.05 % OP EMUL
1.0000 [drp] | Freq: Two times a day (BID) | OPHTHALMIC | Status: DC
  Administered 2011-04-19 – 2011-04-23 (×8): 1 [drp] via OPHTHALMIC
  Filled 2011-04-19 (×10): qty 1

## 2011-04-19 MED ORDER — ENOXAPARIN SODIUM 30 MG/0.3ML ~~LOC~~ SOLN
30.0000 mg | SUBCUTANEOUS | Status: DC
  Administered 2011-04-19 – 2011-04-20 (×2): 30 mg via SUBCUTANEOUS
  Filled 2011-04-19 (×2): qty 0.3

## 2011-04-19 MED ORDER — ONDANSETRON HCL 4 MG PO TABS: 4.0000 mg | ORAL_TABLET | Freq: Four times a day (QID) | ORAL | Status: DC | PRN

## 2011-04-19 MED ORDER — ENOXAPARIN SODIUM 40 MG/0.4ML ~~LOC~~ SOLN: 40.0000 mg | SUBCUTANEOUS | Status: DC

## 2011-04-19 MED ORDER — SODIUM CHLORIDE 0.9 % IV SOLN
INTRAVENOUS | Status: DC
  Administered 2011-04-19 – 2011-04-23 (×8): via INTRAVENOUS

## 2011-04-19 MED ORDER — VITAMIN D3 25 MCG (1000 UNIT) PO TABS
1000.0000 [IU] | ORAL_TABLET | Freq: Every morning | ORAL | Status: DC
  Administered 2011-04-19 – 2011-04-23 (×5): 1000 [IU] via ORAL
  Filled 2011-04-19 (×5): qty 1

## 2011-04-19 MED ORDER — PANTOPRAZOLE SODIUM 40 MG PO TBEC
40.0000 mg | DELAYED_RELEASE_TABLET | Freq: Every day | ORAL | Status: DC
  Administered 2011-04-19 – 2011-04-23 (×5): 40 mg via ORAL
  Filled 2011-04-19 (×5): qty 1

## 2011-04-19 MED ORDER — ONDANSETRON HCL 4 MG/2ML IJ SOLN: 4.0000 mg | Freq: Four times a day (QID) | INTRAMUSCULAR | Status: DC | PRN

## 2011-04-19 MED ORDER — ZOLPIDEM TARTRATE 5 MG PO TABS: 5.0000 mg | ORAL_TABLET | Freq: Every evening | ORAL | Status: DC | PRN

## 2011-04-19 NOTE — ED Notes (Signed)
Calling report now. 

## 2011-04-19 NOTE — ED Provider Notes (Signed)
History     CSN: [id number]  Arrival date & time 04/18/11  2117   First MD Initiated Contact with Patient 04/18/11 2123      Chief Complaint  Patient presents with  . Fall    (Consider location/radiation/quality/duration/timing/severity/associated sxs/prior treatment) HPI Comments: 78 year old male with history of hypertension, dementia, and stroke found down at his ALF for an unknown time. Was last seen by family roughly 24 hours ago. Patient states he lost his balance while trying to get into bed tonight. He denies any headache, neck pain, chest pain, shortness of breath, abdominal pain, nausea, vomiting, diarrhea, fever or any recent infectious symptoms.  Patient is a 78 y.o. male presenting with fall. The history is provided by the EMS personnel.  Fall Incident onset: Unknown, family last saw him 24 hours ago. Incident: From standing. He fell from a height of 1 to 2 ft. He landed on a hard floor. There was no blood loss. Point of impact: Unknown, patient denies pain. Pain location: Patient denies pain. The patient is experiencing no pain. He was not ambulatory at the scene. Pertinent negatives include no fever, no abdominal pain, no nausea, no vomiting and no headaches. Loss of consciousness: Unknown. Exacerbated by: Nothing.    Past Medical History  Diagnosis Date  . Thyroid disease   . Anemia   . Vitamin D deficiency disease   . Hyponatremia   . Anxiety   . Parkinsonism   . Hearing loss   . Hypertension   . PVC (premature ventricular contraction)   . CVA (cerebral infarction)   . GERD (gastroesophageal reflux disease)   . Diverticulosis   . Sciatica   . Back pain   . Gait disorder   . Cataract   . Edema   . Keratosis   . Sciatica   . Cystic disease of liver   . Paresthesia   . Inguinal hernia   . Hearing loss     Past Surgical History  Procedure Date  . Prostate surgery     cancer  . Hernia repair   . Appendectomy     History reviewed. No pertinent  family history.  History  Substance Use Topics  . Smoking status: Never Smoker   . Smokeless tobacco: Not on file  . Alcohol Use: No      Review of Systems  Constitutional: Negative for fever and chills.  HENT: Negative for congestion, sore throat, rhinorrhea and neck pain.   Eyes: Negative for pain, redness and visual disturbance.  Respiratory: Negative for chest tightness and shortness of breath.   Cardiovascular: Negative for chest pain and leg swelling.  Gastrointestinal: Negative for nausea, vomiting, abdominal pain, diarrhea and constipation.  Genitourinary: Negative for dysuria and difficulty urinating.  Musculoskeletal: Negative for back pain and arthralgias.  Skin: Negative for rash and wound.  Neurological: Negative for dizziness, weakness and headaches. Loss of consciousness: Unknown.  Psychiatric/Behavioral: Negative for confusion and dysphoric mood.  All other systems reviewed and are negative.    Allergies  Rivastigmine and Zyprexa  Home Medications   Current Outpatient Rx  Name Route Sig Dispense Refill  . AMLODIPINE BESYLATE 5 MG PO TABS Oral Take 5 mg by mouth every morning.     . ASPIRIN 325 MG PO TABS Oral Take 325 mg by mouth every morning.     Marland Kitchen VITAMIN D 1000 UNITS PO TABS Oral Take 1,000 Units by mouth every morning.     . CYCLOSPORINE 0.05 % OP EMUL Both Eyes Place  1 drop into both eyes 2 (two) times daily.     Marland Kitchen FERROUS SULFATE 325 (65 FE) MG PO TABS Oral Take 325 mg by mouth daily with breakfast.      . LISINOPRIL 20 MG PO TABS Oral Take 20 mg by mouth every morning.    Marland Kitchen OMEPRAZOLE 20 MG PO CPDR Oral Take 20 mg by mouth every morning.     . SENNOSIDES 8.6 MG PO TABS Oral Take 1 tablet by mouth 2 (two) times daily.     Marland Kitchen TAMSULOSIN HCL 0.4 MG PO CAPS Oral Take 0.4 mg by mouth every morning.     Marland Kitchen TEMAZEPAM 30 MG PO CAPS Oral Take 30 mg by mouth at bedtime as needed. For insomnia      BP 133/76  Pulse 76  Temp(Src) 97 F (36.1 C) (Rectal)   Resp 14  SpO2 100%  Physical Exam  Constitutional: He appears well-developed. No distress.       Elderly, thin, malnourished  HENT:  Head: Normocephalic and atraumatic.  Right Ear: External ear normal.  Left Ear: External ear normal.  Mouth/Throat: Oropharynx is clear and moist.  Eyes: Pupils are equal, round, and reactive to light.  Neck: Normal range of motion. Neck supple.       No tenderness to midline C-spine.  Cardiovascular: Normal rate, regular rhythm, normal heart sounds and intact distal pulses.  Exam reveals no gallop and no friction rub.   No murmur heard. Pulmonary/Chest: Effort normal and breath sounds normal. No respiratory distress. He has no wheezes. He has no rales.       No external sign of trauma.  Abdominal: Soft. There is no tenderness. There is no rebound and no guarding.  Musculoskeletal: Normal range of motion. He exhibits no edema and no tenderness.  Lymphadenopathy:    He has no cervical adenopathy.  Neurological: He is alert.       Oriented to person and time.  Skin: Skin is warm and dry. No rash noted. No erythema.  Psychiatric: He has a normal mood and affect. His behavior is normal.    ED Course  Procedures (including critical care time)  Results for orders placed during the hospital encounter of 04/18/11  CBC      Component Value Range   WBC 12.8 (*) 4.0 - 10.5 (K/uL)   RBC 3.02 (*) 4.22 - 5.81 (MIL/uL)   Hemoglobin 9.2 (*) 13.0 - 17.0 (g/dL)   HCT 16.1 (*) 09.6 - 52.0 (%)   MCV 85.1  78.0 - 100.0 (fL)   MCH 30.5  26.0 - 34.0 (pg)   MCHC 35.8  30.0 - 36.0 (g/dL)   RDW 04.5  40.9 - 81.1 (%)   Platelets 267  150 - 400 (K/uL)  DIFFERENTIAL      Component Value Range   Neutrophils Relative 90 (*) 43 - 77 (%)   Lymphocytes Relative 6 (*) 12 - 46 (%)   Monocytes Relative 4  3 - 12 (%)   Eosinophils Relative 0  0 - 5 (%)   Basophils Relative 0  0 - 1 (%)   Neutro Abs 11.5 (*) 1.7 - 7.7 (K/uL)   Lymphs Abs 0.8  0.7 - 4.0 (K/uL)   Monocytes  Absolute 0.5  0.1 - 1.0 (K/uL)   Eosinophils Absolute 0.0  0.0 - 0.7 (K/uL)   Basophils Absolute 0.0  0.0 - 0.1 (K/uL)   RBC Morphology ACANTHOCYTES    COMPREHENSIVE METABOLIC PANEL  Component Value Range   Sodium 143  135 - 145 (mEq/L)   Potassium 3.4 (*) 3.5 - 5.1 (mEq/L)   Chloride 111  96 - 112 (mEq/L)   CO2 17 (*) 19 - 32 (mEq/L)   Glucose, Bld 100 (*) 70 - 99 (mg/dL)   BUN 73 (*) 6 - 23 (mg/dL)   Creatinine, Ser 1.61 (*) 0.50 - 1.35 (mg/dL)   Calcium 9.6  8.4 - 09.6 (mg/dL)   Total Protein 6.3  6.0 - 8.3 (g/dL)   Albumin 3.2 (*) 3.5 - 5.2 (g/dL)   AST 045 (*) 0 - 37 (U/L)   ALT 73 (*) 0 - 53 (U/L)   Alkaline Phosphatase 67  39 - 117 (U/L)   Total Bilirubin 0.3  0.3 - 1.2 (mg/dL)   GFR calc non Af Amer 15 (*) >90 (mL/min)   GFR calc Af Amer 17 (*) >90 (mL/min)  TROPONIN I      Component Value Range   Troponin I 0.51 (*) <0.30 (ng/mL)  LACTIC ACID, PLASMA      Component Value Range   Lactic Acid, Venous 0.7  0.5 - 2.2 (mmol/L)  PROTIME-INR      Component Value Range   Prothrombin Time 14.4  11.6 - 15.2 (seconds)   INR 1.10  0.00 - 1.49   URINALYSIS, ROUTINE W REFLEX MICROSCOPIC      Component Value Range   Color, Urine YELLOW  YELLOW    APPearance CLOUDY (*) CLEAR    Specific Gravity, Urine 1.015  1.005 - 1.030    pH 5.5  5.0 - 8.0    Glucose, UA NEGATIVE  NEGATIVE (mg/dL)   Hgb urine dipstick LARGE (*) NEGATIVE    Bilirubin Urine NEGATIVE  NEGATIVE    Ketones, ur 15 (*) NEGATIVE (mg/dL)   Protein, ur 30 (*) NEGATIVE (mg/dL)   Urobilinogen, UA 0.2  0.0 - 1.0 (mg/dL)   Nitrite NEGATIVE  NEGATIVE    Leukocytes, UA NEGATIVE  NEGATIVE   CK TOTAL AND CKMB      Component Value Range   Total CK 11187 (*) 7 - 232 (U/L)   CK, MB 121.5 (*) 0.3 - 4.0 (ng/mL)   Relative Index 1.1  0.0 - 2.5   URINE MICROSCOPIC-ADD ON      Component Value Range   WBC, UA 0-2  <3 (WBC/hpf)   RBC / HPF 7-10  <3 (RBC/hpf)   Bacteria, UA RARE  RARE    Casts HYALINE CASTS (*) NEGATIVE      Urine-Other AMORPHOUS URATES/PHOSPHATES    PROCALCITONIN      Component Value Range   Procalcitonin 0.79      Ct Head Wo Contrast  04/18/2011  *RADIOLOGY REPORT*  Clinical Data:  Larey Seat.  CT HEAD WITHOUT CONTRAST CT CERVICAL SPINE WITHOUT CONTRAST  Technique:  Multidetector CT imaging of the head and cervical spine was performed following the standard protocol without intravenous contrast.  Multiplanar CT image reconstructions of the cervical spine were also generated.  Comparison:  Head CT dated 08/16/2008 and brain MR dated 03/27/2008.  CT HEAD  Findings: Stable enlarged ventricles and subarachnoid spaces.  No skull fracture, intracranial hemorrhage or paranasal sinus air- fluid levels.  The left vertebral artery remains mildly diffusely enlarged and calcified.  Left internal carotid artery calcification.  IMPRESSION:  1.  No skull fracture or intracranial hemorrhage. 2.  Stable atrophy.  CT CERVICAL SPINE  Findings: Levoconvex scoliosis.  Multilevel degenerative changes. No prevertebral soft tissue swelling, fractures or  subluxations. Mild bilateral carotid artery atheromatous calcification.  Mild biapical pleural and parenchymal scarring.  IMPRESSION:  1.  No fracture or subluxation. 2.  Multilevel degenerative changes. 3.  Mild bilateral carotid artery atheromatous calcification.  Original Report Authenticated By: Darrol Angel, M.D.   Ct Cervical Spine Wo Contrast  04/18/2011  *RADIOLOGY REPORT*  Clinical Data:  Larey Seat.  CT HEAD WITHOUT CONTRAST CT CERVICAL SPINE WITHOUT CONTRAST  Technique:  Multidetector CT imaging of the head and cervical spine was performed following the standard protocol without intravenous contrast.  Multiplanar CT image reconstructions of the cervical spine were also generated.  Comparison:  Head CT dated 08/16/2008 and brain MR dated 03/27/2008.  CT HEAD  Findings: Stable enlarged ventricles and subarachnoid spaces.  No skull fracture, intracranial hemorrhage or paranasal  sinus air- fluid levels.  The left vertebral artery remains mildly diffusely enlarged and calcified.  Left internal carotid artery calcification.  IMPRESSION:  1.  No skull fracture or intracranial hemorrhage. 2.  Stable atrophy.  CT CERVICAL SPINE  Findings: Levoconvex scoliosis.  Multilevel degenerative changes. No prevertebral soft tissue swelling, fractures or subluxations. Mild bilateral carotid artery atheromatous calcification.  Mild biapical pleural and parenchymal scarring.  IMPRESSION:  1.  No fracture or subluxation. 2.  Multilevel degenerative changes. 3.  Mild bilateral carotid artery atheromatous calcification.  Original Report Authenticated By: Darrol Angel, M.D.   Dg Chest Portable 1 View  04/18/2011  *RADIOLOGY REPORT*  Clinical Data: Status post fall; concern for chest injury.  PORTABLE CHEST - 1 VIEW  Comparison: Chest radiograph performed 07/09/2008  Findings: The lungs are well-aerated.  Mild left perihilar opacity may reflect atelectasis or possibly pneumonia.  There is no evidence of pleural effusion or pneumothorax.  The cardiomediastinal silhouette is within normal limits.  No acute osseous abnormalities are seen.  Healed right-sided rib fractures are seen.  IMPRESSION: No acute displaced rib fractures seen; mild left perihilar airspace opacity may reflect atelectasis or possibly pneumonia.  Original Report Authenticated By: Tonia Ghent, M.D.    Date: 04/19/2011  Rate: 83  Rhythm: paced rhythm  QRS Axis: normal  Intervals: normal  ST/T Wave abnormalities: normal  Conduction Disutrbances:borderline AV delay  Narrative Interpretation:   Old EKG Reviewed: NSR, normal axis, normal intervals, normal ST/T waves    1. Altered mental status   2. Fall   3. Acute renal failure   4. Anemia   5. Abnormal LFTs   6. Elevated CK       MDM  3375: 78 year-old male with history of dementia, CVA, hypertension brought in via EMS from his ALF after being found down by his family.  They last saw him in 24 hours ago. The patient states that he lost his balance when getting into bed tonight, however he is and not a reliable historian. On initial evaluation his vitals are stable. He is oriented to self place and time. He has no signs of external trauma. Unsure of exact cause. Query mechanical fall versus syncope. CT head and CT cervical spine are unremarkable. Chest x-ray shows possible area of infection. Total CK is elevated. Patient also found to be in acute renal failure with creatinine of 3.4, also with elevated LFTs. Troponin mildly elevated at 0.5, however doubt this is do to ACS. Given that he has been down for unknown time, along with a possible pneumonia end-stage renal failure and elevated LFTs we'll treat for associated pneumonia with extensive. Internal medicine has been consulted for admission. Patient is  being hydrated with IV fluids.        Sheran Luz, MD 04/19/11 [id number]

## 2011-04-19 NOTE — ED Provider Notes (Signed)
I saw and evaluated the patient, reviewed the resident's note and I agree with the findings and plan.  Found down at independent living facility.  Last seen >24h prior.  Patent states fell tonight.  No obvious injuries.  ABCs intact. Very dry MM.  Distended bladder.  Fall v syncope.  ARF, rhabdomyolysis, HCAP  CRITICAL CARE Performed by: Glynn Octave   Total critical care time: 30  Critical care time was exclusive of separately billable procedures and treating other patients.  Critical care was necessary to treat or prevent imminent or life-threatening deterioration.  Critical care was time spent personally by me on the following activities: development of treatment plan with patient and/or surrogate as well as nursing, discussions with consultants, evaluation of patient's response to treatment, examination of patient, obtaining history from patient or surrogate, ordering and performing treatments and interventions, ordering and review of laboratory studies, ordering and review of radiographic studies, pulse oximetry and re-evaluation of patient's condition.   Glynn Octave, MD 04/19/11 1115

## 2011-04-19 NOTE — H&P (Addendum)
DATE OF ADMISSION:  04/19/2011  PCP:    Kimber Relic, MD, MD   Chief Complaint:    HPI: Vincent Potts is an 78 y.o. male resident of an area Assisted Living Facility who has a history of Parkinson's Disease and Gait disorder who was found on the floor after a prolonged period of time.  He was last seen at his baseline the day before.  The patient reports tripping and falling and not being able to get up.  He denied having chest pain or dizziness associated with the fall.  He does not know how long he was on the floor.  In the ED he was found to have a CK level of 11K, as well as an elevated bun and creatinine.  And the chest X-Ray that was done revealed pneumonia.    Past Medical History  Diagnosis Date  . Thyroid disease   . Anemia   . Vitamin D deficiency disease   . Hyponatremia   . Anxiety   . Parkinsonism   . Hearing loss   . Hypertension   . PVC (premature ventricular contraction)   . CVA (cerebral infarction)   . GERD (gastroesophageal reflux disease)   . Diverticulosis   . Sciatica   . Back pain   . Gait disorder   . Cataract   . Edema   . Keratosis   . Sciatica   . Cystic disease of liver   . Paresthesia   . Inguinal hernia   . Hearing loss     Past Surgical History  Procedure Date  . Prostate surgery     cancer  . Hernia repair   . Appendectomy     Medications:  HOME MEDS: Prior to Admission medications   Medication Sig Start Date End Date Taking? Authorizing Provider  amLODipine (NORVASC) 5 MG tablet Take 5 mg by mouth every morning.    Yes Historical Provider, MD  aspirin 325 MG tablet Take 325 mg by mouth every morning.    Yes Historical Provider, MD  cholecalciferol (VITAMIN D) 1000 UNITS tablet Take 1,000 Units by mouth every morning.    Yes Historical Provider, MD  cycloSPORINE (RESTASIS) 0.05 % ophthalmic emulsion Place 1 drop into both eyes 2 (two) times daily.    Yes Historical Provider, MD  ferrous sulfate 325 (65 FE) MG tablet Take 325  mg by mouth daily with breakfast.     Yes Historical Provider, MD  lisinopril (PRINIVIL,ZESTRIL) 20 MG tablet Take 20 mg by mouth every morning.   Yes Historical Provider, MD  omeprazole (PRILOSEC) 20 MG capsule Take 20 mg by mouth every morning.    Yes Historical Provider, MD  senna (SENOKOT) 8.6 MG tablet Take 1 tablet by mouth 2 (two) times daily.    Yes Historical Provider, MD  Tamsulosin HCl (FLOMAX) 0.4 MG CAPS Take 0.4 mg by mouth every morning.    Yes Historical Provider, MD  temazepam (RESTORIL) 30 MG capsule Take 30 mg by mouth at bedtime as needed. For insomnia   Yes Historical Provider, MD    Allergies:  Allergies  Allergen Reactions  . Rivastigmine Nausea Only  . Zyprexa Other (See Comments)    unknown    Social History:   reports that he has never smoked. He does not have any smokeless tobacco history on file. He reports that he does not drink alcohol or use illicit drugs.  Family History: History reviewed. No pertinent family history.  Review of Systems:  The patient  denies anorexia, fever, weight loss, vision loss, hoarseness, chest pain, syncope, dyspnea on exertion, peripheral edema, hemoptysis, abdominal pain, melena, hematochezia, severe indigestion/heartburn, hematuria, incontinence, genital sores, suspicious skin lesions, transient blindness, depression, unusual weight change, abnormal bleeding, enlarged lymph nodes, angioedema, and breast masses.   Physical Exam:  GEN:  Pleasant examined  and in no acute distress; cooperative with exam Filed Vitals:   04/19/11 0045 04/19/11 0100 04/19/11 0140 04/19/11 0223  BP: 117/67 119/66 117/70   Pulse: 81 80 74   Temp:   97.5 F (36.4 C)   TempSrc:   Oral   Resp: 14 13 15    Height:   5\' 8"  (1.727 m)   Weight:   52.5 kg (115 lb 11.9 oz)   SpO2: 100% 100% 97% 97%   Blood pressure 117/70, pulse 74, temperature 97.5 F (36.4 C), temperature source Oral, resp. rate 15, height 5\' 8"  (1.727 m), weight 52.5 kg (115 lb 11.9  oz), SpO2 97.00%. PSYCH: He is alert and oriented x4; does not appear anxious does not appear depressed; affect is normal HEENT: Normocephalic and Atraumatic, Mucous membranes pink; PERRLA; EOM intact; Fundi:  Benign;  No scleral icterus, Nares: Patent, Oropharynx: Clear, Poor Dentition, Neck:  FROM, no cervical lymphadenopathy nor thyromegaly or carotid bruit; no JVD; Breasts:: Not examined CHEST WALL: No tenderness CHEST: Normal respiration, clear to auscultation bilaterally HEART: Regular rate and rhythm; no murmurs rubs or gallops BACK: No kyphosis or scoliosis; no CVA tenderness ABDOMEN: Positive Bowel Sounds, Scaphoid, Soft Non-tender; no masses, no organomegaly. Rectal Exam: Not done EXTREMITIES: No cyanosis, clubbing or edema; no ulcerations. Genitalia: not examined PULSES: 2+ and symmetric SKIN: Normal hydration no rash or ulceration CNS: parkinson's decreased Hearing   Labs & Imaging Results for orders placed during the hospital encounter of 04/18/11 (from the past 48 hour(s))  URINALYSIS, ROUTINE W REFLEX MICROSCOPIC     Status: Abnormal   Collection Time   04/18/11  9:30 PM      Component Value Range Comment   Color, Urine YELLOW  YELLOW     APPearance CLOUDY (*) CLEAR     Specific Gravity, Urine 1.015  1.005 - 1.030     pH 5.5  5.0 - 8.0     Glucose, UA NEGATIVE  NEGATIVE (mg/dL)    Hgb urine dipstick LARGE (*) NEGATIVE     Bilirubin Urine NEGATIVE  NEGATIVE     Ketones, ur 15 (*) NEGATIVE (mg/dL)    Protein, ur 30 (*) NEGATIVE (mg/dL)    Urobilinogen, UA 0.2  0.0 - 1.0 (mg/dL)    Nitrite NEGATIVE  NEGATIVE     Leukocytes, UA NEGATIVE  NEGATIVE    URINE MICROSCOPIC-ADD ON     Status: Abnormal   Collection Time   04/18/11  9:30 PM      Component Value Range Comment   WBC, UA 0-2  <3 (WBC/hpf)    RBC / HPF 7-10  <3 (RBC/hpf)    Bacteria, UA RARE  RARE     Casts HYALINE CASTS (*) NEGATIVE     Urine-Other AMORPHOUS URATES/PHOSPHATES     CBC     Status: Abnormal    Collection Time   04/18/11  9:31 PM      Component Value Range Comment   WBC 12.8 (*) 4.0 - 10.5 (K/uL)    RBC 3.02 (*) 4.22 - 5.81 (MIL/uL)    Hemoglobin 9.2 (*) 13.0 - 17.0 (g/dL)    HCT 16.1 (*) 09.6 - 52.0 (%)  MCV 85.1  78.0 - 100.0 (fL)    MCH 30.5  26.0 - 34.0 (pg)    MCHC 35.8  30.0 - 36.0 (g/dL)    RDW 40.9  81.1 - 91.4 (%)    Platelets 267  150 - 400 (K/uL)   DIFFERENTIAL     Status: Abnormal   Collection Time   04/18/11  9:31 PM      Component Value Range Comment   Neutrophils Relative 90 (*) 43 - 77 (%)    Lymphocytes Relative 6 (*) 12 - 46 (%)    Monocytes Relative 4  3 - 12 (%)    Eosinophils Relative 0  0 - 5 (%)    Basophils Relative 0  0 - 1 (%)    Neutro Abs 11.5 (*) 1.7 - 7.7 (K/uL)    Lymphs Abs 0.8  0.7 - 4.0 (K/uL)    Monocytes Absolute 0.5  0.1 - 1.0 (K/uL)    Eosinophils Absolute 0.0  0.0 - 0.7 (K/uL)    Basophils Absolute 0.0  0.0 - 0.1 (K/uL)    RBC Morphology ACANTHOCYTES     COMPREHENSIVE METABOLIC PANEL     Status: Abnormal   Collection Time   04/18/11  9:31 PM      Component Value Range Comment   Sodium 143  135 - 145 (mEq/L)    Potassium 3.4 (*) 3.5 - 5.1 (mEq/L)    Chloride 111  96 - 112 (mEq/L)    CO2 17 (*) 19 - 32 (mEq/L)    Glucose, Bld 100 (*) 70 - 99 (mg/dL)    BUN 73 (*) 6 - 23 (mg/dL)    Creatinine, Ser 7.82 (*) 0.50 - 1.35 (mg/dL)    Calcium 9.6  8.4 - 10.5 (mg/dL)    Total Protein 6.3  6.0 - 8.3 (g/dL)    Albumin 3.2 (*) 3.5 - 5.2 (g/dL)    AST 956 (*) 0 - 37 (U/L)    ALT 73 (*) 0 - 53 (U/L)    Alkaline Phosphatase 67  39 - 117 (U/L)    Total Bilirubin 0.3  0.3 - 1.2 (mg/dL)    GFR calc non Af Amer 15 (*) >90 (mL/min)    GFR calc Af Amer 17 (*) >90 (mL/min)   LACTIC ACID, PLASMA     Status: Normal   Collection Time   04/18/11  9:31 PM      Component Value Range Comment   Lactic Acid, Venous 0.7  0.5 - 2.2 (mmol/L)   PROTIME-INR     Status: Normal   Collection Time   04/18/11  9:31 PM      Component Value Range Comment    Prothrombin Time 14.4  11.6 - 15.2 (seconds)    INR 1.10  0.00 - 1.49    TROPONIN I     Status: Abnormal   Collection Time   04/18/11  9:39 PM      Component Value Range Comment   Troponin I 0.51 (*) <0.30 (ng/mL)   CK TOTAL AND CKMB     Status: Abnormal   Collection Time   04/18/11  9:39 PM      Component Value Range Comment   [place] CK 11187 (*) 7 - 232 (U/L)    CK, MB 121.5 (*) 0.3 - 4.0 (ng/mL)    Relative Index 1.1  0.0 - 2.5    PROCALCITONIN     Status: Normal   Collection Time   04/18/11 11:07 PM  Component Value Range Comment   Procalcitonin 0.79     CARDIAC PANEL(CRET KIN+CKTOT+MB+TROPI)     Status: Abnormal   Collection Time   04/18/11 11:55 PM      Component Value Range Comment   Total CK 9620 (*) 7 - 232 (U/L)    CK, MB 102.8 (*) 0.3 - 4.0 (ng/mL) CRITICAL VALUE NOTED.  VALUE IS CONSISTENT WITH PREVIOUSLY REPORTED AND CALLED VALUE.   Troponin I 0.37 (*) <0.30 (ng/mL)    Relative Index 1.1  0.0 - 2.5     Ct Head Wo Contrast  04/18/2011  *RADIOLOGY REPORT*  Clinical Data:  Larey Seat.  CT HEAD WITHOUT CONTRAST CT CERVICAL SPINE WITHOUT CONTRAST  Technique:  Multidetector CT imaging of the head and cervical spine was performed following the standard protocol without intravenous contrast.  Multiplanar CT image reconstructions of the cervical spine were also generated.  Comparison:  Head CT dated 08/16/2008 and brain MR dated 03/27/2008.  CT HEAD  Findings: Stable enlarged ventricles and subarachnoid spaces.  No skull fracture, intracranial hemorrhage or paranasal sinus air- fluid levels.  The left vertebral artery remains mildly diffusely enlarged and calcified.  Left internal carotid artery calcification.  IMPRESSION:  1.  No skull fracture or intracranial hemorrhage. 2.  Stable atrophy.  CT CERVICAL SPINE  Findings: Levoconvex scoliosis.  Multilevel degenerative changes. No prevertebral soft tissue swelling, fractures or subluxations. Mild bilateral carotid artery atheromatous  calcification.  Mild biapical pleural and parenchymal scarring.  IMPRESSION:  1.  No fracture or subluxation. 2.  Multilevel degenerative changes. 3.  Mild bilateral carotid artery atheromatous calcification.  Original Report Authenticated By: Darrol Angel, M.D.   Ct Cervical Spine Wo Contrast  04/18/2011  *RADIOLOGY REPORT*  Clinical Data:  Larey Seat.  CT HEAD WITHOUT CONTRAST CT CERVICAL SPINE WITHOUT CONTRAST  Technique:  Multidetector CT imaging of the head and cervical spine was performed following the standard protocol without intravenous contrast.  Multiplanar CT image reconstructions of the cervical spine were also generated.  Comparison:  Head CT dated 08/16/2008 and brain MR dated 03/27/2008.  CT HEAD  Findings: Stable enlarged ventricles and subarachnoid spaces.  No skull fracture, intracranial hemorrhage or paranasal sinus air- fluid levels.  The left vertebral artery remains mildly diffusely enlarged and calcified.  Left internal carotid artery calcification.  IMPRESSION:  1.  No skull fracture or intracranial hemorrhage. 2.  Stable atrophy.  CT CERVICAL SPINE  Findings: Levoconvex scoliosis.  Multilevel degenerative changes. No prevertebral soft tissue swelling, fractures or subluxations. Mild bilateral carotid artery atheromatous calcification.  Mild biapical pleural and parenchymal scarring.  IMPRESSION:  1.  No fracture or subluxation. 2.  Multilevel degenerative changes. 3.  Mild bilateral carotid artery atheromatous calcification.  Original Report Authenticated By: Darrol Angel, M.D.   Dg Chest Portable 1 View  04/18/2011  *RADIOLOGY REPORT*  Clinical Data: Status post fall; concern for chest injury.  PORTABLE CHEST - 1 VIEW  Comparison: Chest radiograph performed 07/09/2008  Findings: The lungs are well-aerated.  Mild left perihilar opacity may reflect atelectasis or possibly pneumonia.  There is no evidence of pleural effusion or pneumothorax.  The cardiomediastinal silhouette is within  normal limits.  No acute osseous abnormalities are seen.  Healed right-sided rib fractures are seen.  IMPRESSION: No acute displaced rib fractures seen; mild left perihilar airspace opacity may reflect atelectasis or possibly pneumonia.  Original Report Authenticated By: Tonia Ghent, M.D.      Assessment/Plan: Present on Admission:  .Rhabdomyolysis .Acute renal failure .  Altered mental status .Anemia .Fall .Elevated CK .Abnormal LFTs .CVA (cerebral infarction) .Parkinsonism Pneumonia  Plan:    Telemetry monitoring Gentle IVFs for Rehydration, Monitor Bun/Cr and Lytes. Cardiac Enzymes Hold Lisinopril therapy.   IV Vanc and Zosyn to cover HCAP Pneumonia Nebs, O2. Reconcile Home Medications Fall Precautions Other plans as per orders.    CODE STATUS:      FULL CODE         Talan Gildner C 04/19/2011, 3:24 AM

## 2011-04-19 NOTE — ED Notes (Signed)
Patient being transported upstairs on portable cardiac monitor with RN. 

## 2011-04-19 NOTE — Progress Notes (Signed)
Subjective: Complaining of itching on his back. No CP, no SOB. Patient was AAOX2.  Objective: Vital signs in last 24 hours: Temp:  [97 F (36.1 C)-97.8 F (36.6 C)] 97.3 F (36.3 C) (02/18 1035) Pulse Rate:  [68-92] 68  (02/18 1415) Resp:  [13-24] 20  (02/18 1415) BP: (106-153)/(51-85) 106/51 mmHg (02/18 1035) SpO2:  [97 %-100 %] 98 % (02/18 1415) Weight:  [52.5 kg (115 lb 11.9 oz)] 52.5 kg (115 lb 11.9 oz) (02/18 0140) Weight change:  Last BM Date: 04/17/11  Intake/Output from previous day: 02/17 0701 - 02/18 0700 In: 1432.5 [I.V.:1432.5] Out: 475 [Urine:475] Total I/O In: 360 [P.O.:360] Out: 500 [Urine:500]   Physical Exam: General: Alert, awake, oriented x2, in no acute distress. HEENT: No bruits, no goiter. Heart: Regular rate and rhythm, without murmurs, rubs, gallops. Lungs: Clear to auscultation bilaterally. Abdomen: Soft, nontender, nondistended, positive bowel sounds. Extremities: No clubbing cyanosis or edema with positive pedal pulses. Neuro: Grossly intact, nonfocal.  Lab Results: Basic Metabolic Panel:  Basename 04/19/11 0322 04/18/11 2131  NA 145 143  K 3.5 3.4*  CL 115* 111  CO2 16* 17*  GLUCOSE 93 100*  BUN 68* 73*  CREATININE 3.13* 3.44*  CALCIUM 9.4 9.6  MG -- --  PHOS -- --   Liver Function Tests:  [facility] 04/18/11 2131  AST 226*  ALT 73*  ALKPHOS 67  BILITOT 0.3  PROT 6.3  ALBUMIN 3.2*   CBC:  Basename 04/19/11 0322 04/18/11 2131  WBC 12.8* 12.8*  NEUTROABS -- 11.5*  HGB 8.8* 9.2*  HCT 25.5* 25.7*  MCV 85.6 85.1  PLT 273 267   Cardiac Enzymes:  Basename 04/19/11 0900 04/19/11 0322 04/18/11 2355  CKTOTAL 13086* 10935* 9620*  CKMB 94.7* 118.6* 102.8*  CKMBINDEX -- -- --  TROPONINI 0.41* 0.36* 0.37*   Coagulation:  Basename 04/18/11 2131  LABPROT 14.4  INR 1.10   Urinalysis:  Basename 04/18/11 2130  COLORURINE YELLOW  LABSPEC 1.015  PHURINE 5.5  GLUCOSEU NEGATIVE  HGBUR LARGE*  BILIRUBINUR NEGATIVE    KETONESUR 15*  PROTEINUR 30*  UROBILINOGEN 0.2  NITRITE NEGATIVE  LEUKOCYTESUR NEGATIVE    Studies/Results: Ct Head Wo Contrast  04/18/2011  *RADIOLOGY REPORT*  Clinical Data:  Larey Seat.  CT HEAD WITHOUT CONTRAST CT CERVICAL SPINE WITHOUT CONTRAST  Technique:  Multidetector CT imaging of the head and cervical spine was performed following the standard protocol without intravenous contrast.  Multiplanar CT image reconstructions of the cervical spine were also generated.  Comparison:  Head CT dated 08/16/2008 and brain MR dated 03/27/2008.  CT HEAD  Findings: Stable enlarged ventricles and subarachnoid spaces.  No skull fracture, intracranial hemorrhage or paranasal sinus air- fluid levels.  The left vertebral artery remains mildly diffusely enlarged and calcified.  Left internal carotid artery calcification.  IMPRESSION:  1.  No skull fracture or intracranial hemorrhage. 2.  Stable atrophy.  CT CERVICAL SPINE  Findings: Levoconvex scoliosis.  Multilevel degenerative changes. No prevertebral soft tissue swelling, fractures or subluxations. Mild bilateral carotid artery atheromatous calcification.  Mild biapical pleural and parenchymal scarring.  IMPRESSION:  1.  No fracture or subluxation. 2.  Multilevel degenerative changes. 3.  Mild bilateral carotid artery atheromatous calcification.  Original Report Authenticated By: Darrol Angel, M.D.   Ct Cervical Spine Wo Contrast  04/18/2011  *RADIOLOGY REPORT*  Clinical Data:  Larey Seat.  CT HEAD WITHOUT CONTRAST CT CERVICAL SPINE WITHOUT CONTRAST  Technique:  Multidetector CT imaging of the head and cervical spine was performed following  the standard protocol without intravenous contrast.  Multiplanar CT image reconstructions of the cervical spine were also generated.  Comparison:  Head CT dated 08/16/2008 and brain MR dated 03/27/2008.  CT HEAD  Findings: Stable enlarged ventricles and subarachnoid spaces.  No skull fracture, intracranial hemorrhage or paranasal  sinus air- fluid levels.  The left vertebral artery remains mildly diffusely enlarged and calcified.  Left internal carotid artery calcification.  IMPRESSION:  1.  No skull fracture or intracranial hemorrhage. 2.  Stable atrophy.  CT CERVICAL SPINE  Findings: Levoconvex scoliosis.  Multilevel degenerative changes. No prevertebral soft tissue swelling, fractures or subluxations. Mild bilateral carotid artery atheromatous calcification.  Mild biapical pleural and parenchymal scarring.  IMPRESSION:  1.  No fracture or subluxation. 2.  Multilevel degenerative changes. 3.  Mild bilateral carotid artery atheromatous calcification.  Original Report Authenticated By: Darrol Angel, M.D.   Dg Chest Portable 1 View  04/18/2011  *RADIOLOGY REPORT*  Clinical Data: Status post fall; concern for chest injury.  PORTABLE CHEST - 1 VIEW  Comparison: Chest radiograph performed 07/09/2008  Findings: The lungs are well-aerated.  Mild left perihilar opacity may reflect atelectasis or possibly pneumonia.  There is no evidence of pleural effusion or pneumothorax.  The cardiomediastinal silhouette is within normal limits.  No acute osseous abnormalities are seen.  Healed right-sided rib fractures are seen.  IMPRESSION: No acute displaced rib fractures seen; mild left perihilar airspace opacity may reflect atelectasis or possibly pneumonia.  Original Report Authenticated By: Tonia Ghent, M.D.    Medications: Scheduled Meds:   . sodium chloride   Intravenous STAT  . albuterol  2.5 mg Nebulization Q6H  . amLODipine  5 mg Oral q morning - 10a  . antiseptic oral rinse  15 mL Mouth Rinse BID  . cholecalciferol  1,000 Units Oral q morning - 10a  . cycloSPORINE  1 drop Both Eyes BID  . enoxaparin  30 mg Subcutaneous Q24H  . feeding supplement  237 mL Oral BID WC  . ferrous sulfate  325 mg Oral Q breakfast  . pantoprazole  40 mg Oral Q1200  . piperacillin-tazobactam (ZOSYN)  IV  2.25 g Intravenous Q6H  .  piperacillin-tazobactam (ZOSYN)  IV  3.375 g Intravenous Once  . senna  1 tablet Oral BID  . sodium chloride  1,000 mL Intravenous Once  . sodium chloride  3 mL Intravenous Q12H  . Tamsulosin HCl  0.4 mg Oral q morning - 10a  . vancomycin  750 mg Intravenous Q48H  . vancomycin  1,000 mg Intravenous Once  . DISCONTD: albuterol  2.5 mg Nebulization Q6H  . DISCONTD: enoxaparin  40 mg Subcutaneous Q24H  . DISCONTD: enoxaparin  40 mg Subcutaneous Q24H  . DISCONTD: sodium chloride  3 mL Intravenous Q12H   Continuous Infusions:   . sodium chloride 75 mL/hr at 04/19/11 0923  . DISCONTD: sodium chloride 75 mL/hr at 04/19/11 0020   PRN Meds:.acetaminophen, acetaminophen, albuterol, alum & mag hydroxide-simeth, HYDROmorphone, ondansetron (ZOFRAN) IV, ondansetron, oxyCODONE, zolpidem, DISCONTD: acetaminophen, DISCONTD: acetaminophen, DISCONTD: alum & mag hydroxide-simeth, DISCONTD: HYDROmorphone, DISCONTD: ondansetron (ZOFRAN) IV, DISCONTD: ondansetron, DISCONTD: oxyCODONE, DISCONTD: zolpidem  Assessment/Plan: 1-Rhabdomyolysis:will continue IVF's and follow CK levels.  2-Acute renal failure:2/2 #1 and also continuation of lisinopril; continue IVF, hold ACE inhibitors and follow Cr trend.  3- Elevated CK: 2/2 Rhabdomyolysis, continue IVF;s. NO CP, no SOB or any other explanation for abnormal muscle enzymes.  4-?PNA:Will repeat CXR in am. Will continue antibiotics for now.  5-Parkinsonism: stable. Continue  supportive care.  6-CVA (cerebral infarction):no new focal deficit. Continue risk fx's modifications.  7-Altered mental status: probably 2/2 mild uremia with ARF vs TME due to pneumonia;   8-Anemia: most likely AOCD; will follow Hgb trend and check anemia panel.   9-Fall: mechanical in nature; will ask PT to evaluate and tx.  10-protein calorie malnutrition: continue ensure  11-GERD:continue PPI.  12-Hypokalemia: repleted.       LOS: 1 day   Mikaylah Libbey [facility] [phone]  04/19/2011, 4:17 PM

## 2011-04-19 NOTE — ED Notes (Signed)
Patient currently resting quietly in bed; no respiratory or acute distress noted.  Patient updated on plan of care; informed patient that a bed request has been put in and that we are currently waiting on a bed to become available.  Patient has no other questions or concerns at this time; will continue to monitor. 

## 2011-04-19 NOTE — Progress Notes (Signed)
INITIAL ADULT NUTRITION ASSESSMENT Date: 04/19/2011   Time: 10:11 AM  Reason for Assessment: Nutrition Risk Report  ASSESSMENT: Male 78 y.o.  Dx: Acute renal failure  Hx:  Past Medical History  Diagnosis Date  . Thyroid disease   . Anemia   . Vitamin D deficiency disease   . Hyponatremia   . Anxiety   . Parkinsonism   . Hearing loss   . Hypertension   . PVC (premature ventricular contraction)   . CVA (cerebral infarction)   . GERD (gastroesophageal reflux disease)   . Diverticulosis   . Sciatica   . Back pain   . Gait disorder   . Cataract   . Edema   . Keratosis   . Sciatica   . Cystic disease of liver   . Paresthesia   . Inguinal hernia   . Hearing loss    Related Meds:     . sodium chloride   Intravenous STAT  . albuterol  2.5 mg Nebulization Q6H  . amLODipine  5 mg Oral q morning - 10a  . antiseptic oral rinse  15 mL Mouth Rinse BID  . cholecalciferol  1,000 Units Oral q morning - 10a  . cycloSPORINE  1 drop Both Eyes BID  . enoxaparin  40 mg Subcutaneous Q24H  . ferrous sulfate  325 mg Oral Q breakfast  . pantoprazole  40 mg Oral Q1200  . piperacillin-tazobactam (ZOSYN)  IV  2.25 g Intravenous Q6H  . piperacillin-tazobactam (ZOSYN)  IV  3.375 g Intravenous Once  . senna  1 tablet Oral BID  . sodium chloride  1,000 mL Intravenous Once  . sodium chloride  3 mL Intravenous Q12H  . Tamsulosin HCl  0.4 mg Oral q morning - 10a  . vancomycin  750 mg Intravenous Q48H  . vancomycin  1,000 mg Intravenous Once  . DISCONTD: albuterol  2.5 mg Nebulization Q6H  . DISCONTD: enoxaparin  40 mg Subcutaneous Q24H  . DISCONTD: sodium chloride  3 mL Intravenous Q12H   Ht: 5\' 8"  (172.7 cm)  Wt: 115 lb 11.9 oz (52.5 kg)  Ideal Wt: 70 kg % Ideal Wt: 75%  Usual wt:  Wt Readings from Last 3 Encounters:  04/19/11 115 lb 11.9 oz (52.5 kg)  11/19/10 129 lb 6.4 oz (58.695 kg)  11/12/10 126 lb 6 oz (57.323 kg)  % Usual Wt: 58 kg, 90.5%  Body mass index is 17.60  kg/(m^2). Pt is underweight.  Food/Nutrition Related Hx: from ALF  Labs:  CMP     Component Value Date/Time   NA 145 04/19/2011 0322   K 3.5 04/19/2011 0322   CL 115* 04/19/2011 0322   CO2 16* 04/19/2011 0322   GLUCOSE 93 04/19/2011 0322   BUN 68* 04/19/2011 0322   CREATININE 3.13* 04/19/2011 0322   CALCIUM 9.4 04/19/2011 0322   PROT 6.3 04/18/2011 2131   ALBUMIN 3.2* 04/18/2011 2131   AST 226* 04/18/2011 2131   ALT 73* 04/18/2011 2131   ALKPHOS 67 04/18/2011 2131   BILITOT 0.3 04/18/2011 2131   GFRNONAA 17* 04/19/2011 0322   GFRAA 19* 04/19/2011 0322    I/O last 3 completed shifts: In: 1432.5 [I.V.:1432.5] Out: 475 [Urine:475]    Diet Order: Cardiac  Supplements/Tube Feeding: none  IVF:    sodium chloride Last Rate: 75 mL/hr at 04/19/11 0020  sodium chloride Last Rate: 75 mL/hr at 04/19/11 0923   Estimated Nutritional Needs:   Kcal: 1100 - 1300 kcal Protein:  58 - 70 grams Fluid:  1.5 - 1.7 L/d  Found down on floor at ALF. Work up reveals ARF and elevated CK, as well as PNA. States he ate "some" of his breakfast. Stated that is aware of recent weight loss but unable to explain why. Noted current BMI is underweight, at 17. Per weight hx, wt has decreased 9.5% x 6 months. Pt at risk for malnutrition given declining weight, advanced age and chronic medical conditions. Agreeable to Ensure supplements. Denies difficulty chewing or swallowing foods.  NUTRITION DIAGNOSIS: -Inadequate oral intake (NI-2.1).  Status: Ongoing  RELATED TO: variable appetite  AS EVIDENCE BY: pt report and declining weight per weight hx records.  MONITORING/EVALUATION(Goals): Goal: Pt to consume >/= 50% of meals and supplements. Monitor: PO intake, weights, labs, I/O's  EDUCATION NEEDS: -Education needs addressed  INTERVENTION: 1. Discussed importance of increasing kcal and protein to prevent further weight loss. Encouraged intake of meals and supplements. 2. Ensure Clinical Strength PO BID 3. RD  to follow nutrition care plan  Dietitian #: [phone]  DOCUMENTATION CODES Per approved criteria  -Underweight    Adair Laundry 04/19/2011, 10:11 AM

## 2011-04-19 NOTE — ED Notes (Signed)
Dr. Jenkins at bedside. 

## 2011-04-19 NOTE — Progress Notes (Signed)
Arrived from ED. Alert and talking. Very HOH, and unable to watch safety video. Assessment completed and charted. Pt has bruising upper/lower extremities , with abrasions on knees, elbows, and LE's. Swelling with bruising left shoulder.  Redness sacral area, but blanchable. Heel protectors, fall precaution, all in place. Pt educated on fall precautions, and oriented to room and equipment.

## 2011-04-19 NOTE — ED Notes (Signed)
Report given to Zella Ball, RN on 6700; informed Zella Ball that she can call back with any questions or concerns once the patient arrives to the floor.  Preparing patient for transport.

## 2011-04-19 NOTE — Progress Notes (Signed)
ANTIBIOTIC CONSULT NOTE - INITIAL  Pharmacy Consult for Vancocin/Zosyn Indication: rule out pneumonia  Allergies  Allergen Reactions  . Rivastigmine Nausea Only  . Zyprexa Other (See Comments)    unknown   Vital Signs: Temp: 97 F (36.1 C) (02/17 2313) Temp src: Rectal (02/17 2313) BP: 133/76 mmHg (02/17 2345) Pulse Rate: 76  (02/17 2345)  Labs:  Basename 04/18/11 2131  WBC 12.8*  HGB 9.2*  PLT 267  LABCREA --  CREATININE 3.44*   Microbiology: No results found for this or any previous visit (from the past 720 hour(s)).  Medical History: Past Medical History  Diagnosis Date  . Thyroid disease   . Anemia   . Vitamin D deficiency disease   . Hyponatremia   . Anxiety   . Parkinsonism   . Hearing loss   . Hypertension   . PVC (premature ventricular contraction)   . CVA (cerebral infarction)   . GERD (gastroesophageal reflux disease)   . Diverticulosis   . Sciatica   . Back pain   . Gait disorder   . Cataract   . Edema   . Keratosis   . Sciatica   . Cystic disease of liver   . Paresthesia   . Inguinal hernia   . Hearing loss    Assessment: 78yo male found down at independent living facility, atelectasis vs PNA, to begin IV ABX.  Goal of Therapy:  Vancomycin trough level 15-20 mcg/ml  Plan:  Rec'd Zosyn 3.375g and vanc 1g IV in ED.  Will begin vancomycin 750mg  IV Q48H and Zosyn 2.25g IV Q6H but will need to verify current wt since pt is emaciated.  Monitor CBC, Cx, levels prn.  Colleen Can PharmD BCPS 04/19/2011,12:13 AM

## 2011-04-19 NOTE — ED Notes (Signed)
C-Collar has been cleared and removed by ED resident.

## 2011-04-20 DIAGNOSIS — T795XXA Traumatic anuria, initial encounter: Secondary | ICD-10-CM | POA: Diagnosis not present

## 2011-04-20 DIAGNOSIS — D696 Thrombocytopenia, unspecified: Secondary | ICD-10-CM | POA: Diagnosis not present

## 2011-04-20 DIAGNOSIS — W010XXA Fall on same level from slipping, tripping and stumbling without subsequent striking against object, initial encounter: Secondary | ICD-10-CM | POA: Diagnosis not present

## 2011-04-20 DIAGNOSIS — M6282 Rhabdomyolysis: Secondary | ICD-10-CM | POA: Diagnosis not present

## 2011-04-20 LAB — BASIC METABOLIC PANEL
BUN: 48 mg/dL — ABNORMAL HIGH (ref 6–23)
CO2: 18 mEq/L — ABNORMAL LOW (ref 19–32)
Chloride: 115 mEq/L — ABNORMAL HIGH (ref 96–112)
GFR calc non Af Amer: 22 mL/min — ABNORMAL LOW (ref >90)
Glucose, Bld: 123 mg/dL — ABNORMAL HIGH (ref 70–99)
Potassium: 3.4 mEq/L — ABNORMAL LOW (ref 3.5–5.1)
Sodium: 142 mEq/L (ref 135–145)

## 2011-04-20 LAB — CBC
HCT: 21 % — ABNORMAL LOW (ref 39.0–52.0)
Hemoglobin: 7.4 g/dL — ABNORMAL LOW (ref 13.0–17.0)
MCH: 30 pg (ref 26.0–34.0)
MCHC: 35.2 g/dL (ref 30.0–36.0)
RBC: 2.47 MIL/uL — ABNORMAL LOW (ref 4.22–5.81)

## 2011-04-20 MED ORDER — POTASSIUM CHLORIDE CRYS ER 20 MEQ PO TBCR
20.0000 meq | EXTENDED_RELEASE_TABLET | Freq: Once | ORAL | Status: AC
  Administered 2011-04-20: 20 meq via ORAL
  Filled 2011-04-20: qty 1

## 2011-04-20 MED ORDER — METHOCARBAMOL 500 MG PO TABS
500.0000 mg | ORAL_TABLET | Freq: Three times a day (TID) | ORAL | Status: DC | PRN
  Filled 2011-04-20: qty 1

## 2011-04-20 MED ORDER — MOXIFLOXACIN HCL IN NACL 400 MG/250ML IV SOLN
400.0000 mg | INTRAVENOUS | Status: DC
  Administered 2011-04-20 – 2011-04-22 (×3): 400 mg via INTRAVENOUS
  Filled 2011-04-20 (×4): qty 250

## 2011-04-20 MED ORDER — LOPERAMIDE HCL 2 MG PO CAPS
2.0000 mg | ORAL_CAPSULE | ORAL | Status: DC | PRN
  Filled 2011-04-20: qty 1

## 2011-04-20 NOTE — Progress Notes (Signed)
Clinical social worker received referral that patient is from [facility], per conversation with [facility] pt is a resident in independent living. CSW awaiting pt/ot evaluation to determine disposition needs and will complete pt psychosocial assessment. .Clinical social worker continuing to follow pt to assist with pt dc plans and further csw needs.   Catha Gosselin, Theresia Majors  [phone] .04/20/2011 16:52pm

## 2011-04-20 NOTE — Progress Notes (Signed)
Subjective: Still with some back pain. No CP, no SOB. Feeling slightly better.  Objective: Vital signs in last 24 hours: Temp:  [97.3 F (36.3 C)-97.8 F (36.6 C)] 97.6 F (36.4 C) (02/19 1400) Pulse Rate:  [79-89] 86  (02/19 1400) Resp:  [18-22] 20  (02/19 1400) BP: (109-125)/(56-73) 119/73 mmHg (02/19 1400) SpO2:  [90 %-100 %] 92 % (02/19 1422) Weight change:  Last BM Date: 04/17/11  Intake/Output from previous day: 02/18 0701 - 02/19 0700 In: 2070 [P.O.:1070; I.V.:900; IV Piggyback:100] Out: 2650 [Urine:2650] Total I/O In: 360 [P.O.:360] Out: 325 [Urine:325]   Physical Exam: General: Alert, awake, oriented x2, in no acute distress. HEENT: No bruits, no goiter. Heart: Regular rate and rhythm, without murmurs, rubs, gallops. Lungs: Clear to auscultation bilaterally. Abdomen: Soft, nontender, nondistended, positive bowel sounds. Extremities: No clubbing cyanosis or edema with positive pedal pulses. Neuro: Grossly intact, nonfocal.  Lab Results: Basic Metabolic Panel:  Basename 04/20/11 0535 04/19/11 0322  NA 142 145  K 3.4* 3.5  CL 115* 115*  CO2 18* 16*  GLUCOSE 123* 93  BUN 48* 68*  CREATININE 2.53* 3.13*  CALCIUM 8.7 9.4  MG -- --  PHOS -- --   Liver Function Tests:  [facility] 04/18/11 2131  AST 226*  ALT 73*  ALKPHOS 67  BILITOT 0.3  PROT 6.3  ALBUMIN 3.2*   CBC:  Basename 04/20/11 0535 04/19/11 0322 04/18/11 2131  WBC 13.1* 12.8* --  NEUTROABS -- -- 11.5*  HGB 7.4* 8.8* --  HCT 21.0* 25.5* --  MCV 85.0 85.6 --  PLT 259 273 --   Cardiac Enzymes:  Basename 04/19/11 1911 04/19/11 1543 04/19/11 0900  CKTOTAL 6833* 7899* 10076*  CKMB 47.7* 62.2* 94.7*  CKMBINDEX -- -- --  TROPONINI 0.33* 0.35* 0.41*   Coagulation:  Basename 04/18/11 2131  LABPROT 14.4  INR 1.10   Urinalysis:  Basename 04/18/11 2130  COLORURINE YELLOW  LABSPEC 1.015  PHURINE 5.5  GLUCOSEU NEGATIVE  HGBUR LARGE*  BILIRUBINUR NEGATIVE  KETONESUR 15*  PROTEINUR  30*  UROBILINOGEN 0.2  NITRITE NEGATIVE  LEUKOCYTESUR NEGATIVE    Studies/Results: Ct Head Wo Contrast  04/18/2011  *RADIOLOGY REPORT*  Clinical Data:  Larey Seat.  CT HEAD WITHOUT CONTRAST CT CERVICAL SPINE WITHOUT CONTRAST  Technique:  Multidetector CT imaging of the head and cervical spine was performed following the standard protocol without intravenous contrast.  Multiplanar CT image reconstructions of the cervical spine were also generated.  Comparison:  Head CT dated 08/16/2008 and brain MR dated 03/27/2008.  CT HEAD  Findings: Stable enlarged ventricles and subarachnoid spaces.  No skull fracture, intracranial hemorrhage or paranasal sinus air- fluid levels.  The left vertebral artery remains mildly diffusely enlarged and calcified.  Left internal carotid artery calcification.  IMPRESSION:  1.  No skull fracture or intracranial hemorrhage. 2.  Stable atrophy.  CT CERVICAL SPINE  Findings: Levoconvex scoliosis.  Multilevel degenerative changes. No prevertebral soft tissue swelling, fractures or subluxations. Mild bilateral carotid artery atheromatous calcification.  Mild biapical pleural and parenchymal scarring.  IMPRESSION:  1.  No fracture or subluxation. 2.  Multilevel degenerative changes. 3.  Mild bilateral carotid artery atheromatous calcification.  Original Report Authenticated By: Darrol Angel, M.D.   Ct Cervical Spine Wo Contrast  04/18/2011  *RADIOLOGY REPORT*  Clinical Data:  Larey Seat.  CT HEAD WITHOUT CONTRAST CT CERVICAL SPINE WITHOUT CONTRAST  Technique:  Multidetector CT imaging of the head and cervical spine was performed following the standard protocol without intravenous contrast.  Multiplanar CT image reconstructions of the cervical spine were also generated.  Comparison:  Head CT dated 08/16/2008 and brain MR dated 03/27/2008.  CT HEAD  Findings: Stable enlarged ventricles and subarachnoid spaces.  No skull fracture, intracranial hemorrhage or paranasal sinus air- fluid levels.  The  left vertebral artery remains mildly diffusely enlarged and calcified.  Left internal carotid artery calcification.  IMPRESSION:  1.  No skull fracture or intracranial hemorrhage. 2.  Stable atrophy.  CT CERVICAL SPINE  Findings: Levoconvex scoliosis.  Multilevel degenerative changes. No prevertebral soft tissue swelling, fractures or subluxations. Mild bilateral carotid artery atheromatous calcification.  Mild biapical pleural and parenchymal scarring.  IMPRESSION:  1.  No fracture or subluxation. 2.  Multilevel degenerative changes. 3.  Mild bilateral carotid artery atheromatous calcification.  Original Report Authenticated By: Darrol Angel, M.D.   Dg Chest Portable 1 View  04/18/2011  *RADIOLOGY REPORT*  Clinical Data: Status post fall; concern for chest injury.  PORTABLE CHEST - 1 VIEW  Comparison: Chest radiograph performed 07/09/2008  Findings: The lungs are well-aerated.  Mild left perihilar opacity may reflect atelectasis or possibly pneumonia.  There is no evidence of pleural effusion or pneumothorax.  The cardiomediastinal silhouette is within normal limits.  No acute osseous abnormalities are seen.  Healed right-sided rib fractures are seen.  IMPRESSION: No acute displaced rib fractures seen; mild left perihilar airspace opacity may reflect atelectasis or possibly pneumonia.  Original Report Authenticated By: Tonia Ghent, M.D.    Medications: Scheduled Meds:    . sodium chloride   Intravenous STAT  . albuterol  2.5 mg Nebulization Q6H  . amLODipine  5 mg Oral q morning - 10a  . antiseptic oral rinse  15 mL Mouth Rinse BID  . cholecalciferol  1,000 Units Oral q morning - 10a  . cycloSPORINE  1 drop Both Eyes BID  . enoxaparin  30 mg Subcutaneous Q24H  . feeding supplement  237 mL Oral BID WC  . ferrous sulfate  325 mg Oral Q breakfast  . moxifloxacin  400 mg Intravenous Q24H  . pantoprazole  40 mg Oral Q1200  . senna  1 tablet Oral BID  . sodium chloride  3 mL Intravenous Q12H  .  Tamsulosin HCl  0.4 mg Oral q morning - 10a  . DISCONTD: piperacillin-tazobactam (ZOSYN)  IV  2.25 g Intravenous Q6H  . DISCONTD: vancomycin  750 mg Intravenous Q48H   Continuous Infusions:    . sodium chloride 75 mL/hr at 04/20/11 0700  . DISCONTD: sodium chloride 75 mL/hr at 04/19/11 0020   PRN Meds:.acetaminophen, acetaminophen, albuterol, alum & mag hydroxide-simeth, HYDROmorphone, methocarbamol, ondansetron (ZOFRAN) IV, ondansetron, oxyCODONE, zolpidem  Assessment/Plan: 1-Rhabdomyolysis: improved with IVF; will continue IVF's and follow CK levels.  2-Acute renal failure: 2/2 #1 and also continuation of lisinopril; continue IVF, will continue holding ACE inhibitors and follow Cr trend.  3- Elevated CK/troponins: 2/2 Rhabdomyolysis, continue IVF's. NO CP, no SOB or any other explanation for abnormal muscle/cardiac enzymes. Telemetry and EKG unremarkable  4-?PNA: CXR findings more consistent with atelectasis. Due to elevated WBC's and concerns if his fall is associate with early PNA will tx for 5 more days to complete 7 days of antibiotics. But will narrow to avelox now. Will start Incentive spirometry.  5-Parkinsonism: stable. Continue supportive care.  6-CVA (cerebral infarction): no new focal deficit. Continue risk fx's modifications.  7-Altered mental status: probably 2/2 mild uremia with ARF vs TME due to pneumonia; mental status improving. Will continue IVF's and  also antibiotics as specified above.   8-Anemia: most likely AOCD; will follow Hgb trend. Hgb 7.4; will transfuse if less than 7. Start SCD's and discontinue lovenox.   9-Fall: mechanical in nature; follow PT/OT evaluation and rec's.  10-protein calorie malnutrition: continue ensure BID; encourage patient to eat.  11-GERD:continue PPI.  12-Hypokalemia: potassium 3.4 today; will replete.  13-DVT: SCD's      LOS: 2 days   Siniya Lichty [facility] [phone]  04/20/2011, 3:27 PM

## 2011-04-21 DIAGNOSIS — M6282 Rhabdomyolysis: Secondary | ICD-10-CM | POA: Diagnosis not present

## 2011-04-21 DIAGNOSIS — T795XXA Traumatic anuria, initial encounter: Secondary | ICD-10-CM | POA: Diagnosis not present

## 2011-04-21 DIAGNOSIS — W010XXA Fall on same level from slipping, tripping and stumbling without subsequent striking against object, initial encounter: Secondary | ICD-10-CM | POA: Diagnosis not present

## 2011-04-21 DIAGNOSIS — D696 Thrombocytopenia, unspecified: Secondary | ICD-10-CM | POA: Diagnosis not present

## 2011-04-21 LAB — BASIC METABOLIC PANEL
BUN: 29 mg/dL — ABNORMAL HIGH (ref 6–23)
CO2: 20 mEq/L (ref 19–32)
Calcium: 8.8 mg/dL (ref 8.4–10.5)
Chloride: 111 mEq/L (ref 96–112)
Creatinine, Ser: 1.9 mg/dL — ABNORMAL HIGH (ref 0.50–1.35)
GFR calc Af Amer: 35 mL/min — ABNORMAL LOW (ref >90)

## 2011-04-21 LAB — CBC
HCT: 23 % — ABNORMAL LOW (ref 39.0–52.0)
MCH: 29.4 pg (ref 26.0–34.0)
MCV: 85.5 fL (ref 78.0–100.0)
Platelets: 293 10*3/uL (ref 150–400)
RDW: 16.3 % — ABNORMAL HIGH (ref 11.5–15.5)
WBC: 13.6 10*3/uL — ABNORMAL HIGH (ref 4.0–10.5)

## 2011-04-21 MED ORDER — POTASSIUM CHLORIDE CRYS ER 20 MEQ PO TBCR
40.0000 meq | EXTENDED_RELEASE_TABLET | Freq: Once | ORAL | Status: AC
  Administered 2011-04-21: 40 meq via ORAL
  Filled 2011-04-21: qty 2

## 2011-04-21 NOTE — Evaluation (Signed)
Occupational Therapy Evaluation Patient Details Name: Vincent Potts MRN: [id number] DOB: 05/17/1924 Today's Date: 04/21/2011  Problem List:  Patient Active Problem List  Diagnoses  . Parkinsonism  . CVA (cerebral infarction)  . Hypertension  . Back pain  . Hearing loss  . Gait disorder  . Diverticulosis  . GERD (gastroesophageal reflux disease)  . Rhabdomyolysis  . Acute renal failure  . Altered mental status  . Anemia  . Fall  . Elevated CK  . Abnormal LFTs    Past Medical History:  Past Medical History  Diagnosis Date  . Thyroid disease   . Anemia   . Vitamin D deficiency disease   . Hyponatremia   . Anxiety   . Parkinsonism   . Hearing loss   . Hypertension   . PVC (premature ventricular contraction)   . CVA (cerebral infarction)   . GERD (gastroesophageal reflux disease)   . Diverticulosis   . Sciatica   . Back pain   . Gait disorder   . Cataract   . Edema   . Keratosis   . Sciatica   . Cystic disease of liver   . Paresthesia   . Inguinal hernia   . Hearing loss    Past Surgical History:  Past Surgical History  Procedure Date  . Prostate surgery     cancer  . Hernia repair   . Appendectomy     OT Assessment/Plan/Recommendation OT Assessment Clinical Impression Statement: Pt s/p admit for fall in his apartment after a bout with diarrhea with deficits in areas of balance and functional mobility.  Pt would benefiti from cont. OT to increase I with ADLS back to baseline so pt can return to [facility]. OT Recommendation/Assessment: Patient will need skilled OT in the acute care venue OT Problem List: Decreased strength;Impaired balance (sitting and/or standing);Decreased knowledge of use of DME or AE Barriers to Discharge: Decreased caregiver support Barriers to Discharge Comments: Pt lives in I living and gets no  assist with adls.  Pt has had falls in the past.  With Parkinson's history, pt is a fall risk.  pt might benefit  from SNF rehab before returning home.  Pt may want to consider assisted living at some point.  If refuses SNF rehab, rec HH. OT Therapy Diagnosis : Generalized weakness OT Plan OT Frequency: Min 2X/week OT Treatment/Interventions: Self-care/ADL training;DME and/or AE instruction;Therapeutic activities OT Recommendation Recommendations for Other Services: PT consult Follow Up Recommendations: Skilled nursing facility;Home health OT;Other (comment) (depends on mobiltiy with PT.) Equipment Recommended: None recommended by OT Individuals Consulted Consulted and Agree with Results and Recommendations: Patient OT Goals Acute Rehab OT Goals OT Goal Formulation: With patient Time For Goal Achievement: 2 weeks ADL Goals Pt Will Perform Grooming: Independently;Standing at sink ADL Goal: Grooming - Progress: Goal set today Pt Will Perform Upper Body Bathing: with set-up;Sit to stand in shower ADL Goal: Upper Body Bathing - Progress: Goal set today Pt Will Perform Lower Body Bathing: with set-up;Sit to stand in shower ADL Goal: Lower Body Bathing - Progress: Goal set today Pt Will Perform Upper Body Dressing: with set-up;Sit to stand from chair ADL Goal: Upper Body Dressing - Progress: Goal set today Pt Will Perform Lower Body Dressing: with modified independence;Sit to stand from chair ADL Goal: Lower Body Dressing - Progress: Goal set today Pt Will Perform Tub/Shower Transfer: Tub transfer;with modified independence;Grab bars ADL Goal: Tub/Shower Transfer - Progress: Goal set today Additional ADL Goal #1: Pt will complete  all aspects of toileting with mod I ADL Goal: Additional Goal #1 - Progress: Goal set today  OT Evaluation Precautions/Restrictions  Precautions Precautions: Fall Precaution Comments: Pt reports 2 falls in last 6 months. Required Braces or Orthoses: No Restrictions Weight Bearing Restrictions: No Prior Functioning Home Living Lives With: Alone Receives Help From:  Other (Comment) (none.  Pt in Independent Living.) Type of Home: Apartment Home Layout: One level Home Access: Level entry Bathroom Shower/Tub: Tub/shower unit;Curtain Bathroom Toilet: Standard Bathroom Accessibility: Yes How Accessible: Accessible via walker Home Adaptive Equipment: Grab bars around toilet;Grab bars in shower;Walker - four wheeled Additional Comments: Pt uses rollator walker in building but not in his apartment.  Pt walks to dining room once a day on same level as his apartment. Prior Function Level of Independence: Needs assistance with homemaking;Independent with basic ADLs;Independent with gait;Independent with transfers Homemaking Assistance Comments: staff cleans apartment and changes sheets. 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 ADL ADL Eating/Feeding: Performed;Independent Where Assessed - Eating/Feeding: Chair Grooming: Performed;Teeth care;Wash/dry face;Wash/dry hands;Brushing hair;Supervision/safety Grooming Details (indicate cue type and reason): S only for standing balance. Where Assessed - Grooming: Standing at sink Upper Body Bathing: Simulated;Set up Where Assessed - Upper Body Bathing: Sitting, chair Lower Body Bathing: Simulated;Set up Lower Body Bathing Details (indicate cue type and reason): S to stand.  Takes pt 2-3 times to stand but is able to do without physical assist. Where Assessed - Lower Body Bathing: Sit to stand from chair Upper Body Dressing: Performed;Set up Where Assessed - Upper Body Dressing: Sitting, chair Lower Body Dressing: Performed;Supervision/safety Lower Body Dressing Details (indicate cue type and reason): S only for sit to stand. Where Assessed - Lower Body Dressing: Sit to stand from chair Toilet Transfer: Performed;Supervision/safety Toilet Transfer Details (indicate cue type and reason): again...pt needed 2 attempts to stand but did w/o physical  assist. Toilet Transfer Method: Ambulating Toilet Transfer Equipment: Comfort height toilet;Grab bars Toileting - Clothing Manipulation: Simulated;Supervision/safety Where Assessed - Toileting Clothing Manipulation: Standing Toileting - Hygiene: Performed;Supervision/safety;Set up Where Assessed - Toileting Hygiene: Sit on 3-in-1 or toilet Tub/Shower Transfer: Not assessed Tub/Shower Transfer Method: Not assessed Equipment Used: Rolling walker Ambulation Related to ADLs: Pt walked with walker part of time and w/o walker part of time.  Felt pt was safer with walker.  See PT note for further details. ADL Comments: Pt overall S with adls.  pt will not have S at home to perform these adls.  There is a fall concern for this patient.  Pt feels he was weak after having a stomach viurs for a few days when he fell and that this is very unusual for him. Vision/Perception  Vision - History Baseline Vision: Wears glasses all the time Visual History: Glaucoma Patient Visual Report: No change from baseline Vision - Assessment Eye Alignment: Within Functional Limits Vision Assessment: Vision tested Tracking/Visual Pursuits: Able to track stimulus in all quads without difficulty Visual Fields: No apparent deficits Cognition Cognition Arousal/Alertness: Awake/alert Overall Cognitive Status: Appears within functional limits for tasks assessed Cognition - Other Comments: Pt very HOH.  Seems overall cognitively intact. Takes care of all own finances and drives. Sensation/Coordination Sensation Light Touch: Appears Intact Coordination Gross Motor Movements are Fluid and Coordinated: Yes Fine Motor Movements are Fluid and Coordinated: Yes Extremity Assessment RUE Assessment RUE Assessment: Within Functional Limits LUE Assessment LUE Assessment: Within Functional Limits Mobility  Transfers Transfers: Yes Sit to Stand: 5: Supervision;Without upper extremity  assist;With armrests;From chair/3-in-1  (usually takes pt 2x to get up.) Stand to Sit: 5: Supervision;With armrests;To chair/3-in-1;Without upper extremity assist Exercises   End of Session OT - End of Session Activity Tolerance: Patient tolerated treatment well Patient left: in chair;with call bell in reach Nurse Communication: Mobility status for transfers;Mobility status for ambulation General Behavior During Session: [facility] for tasks performed Cognition: [facility] for tasks performed   Hope Budds [phone] 04/21/2011, 11:05 AM

## 2011-04-21 NOTE — Progress Notes (Signed)
Patient ID: Vincent Potts, male   DOB: March 05, 1924, 78 y.o.   MRN: [id number]  Subjective: No events overnight. Patient denies chest pain, shortness of breath, abdominal pain.   Objective:  Vital signs in last 24 hours:  Filed Vitals:   04/21/11 0510 04/21/11 1000 04/21/11 1046 04/21/11 1112  BP: 132/76 113/60  114/67  Pulse: 93 100    Temp: 97.3 F (36.3 C) 98 F (36.7 C)    TempSrc: Oral Oral    Resp: 20 20    Height:      Weight:      SpO2: 96% 100% 98%     Intake/Output from previous day:  Intake/Output Summary (Last 24 hours) at 04/21/11 1431 Last data filed at 04/21/11 0900  Gross per 24 hour  Intake 3006.25 ml  Output   2228 ml  Net 778.25 ml    Physical Exam: General: Alert, awake, oriented x3, in no acute distress. HEENT: No bruits, no goiter. Moist mucous membranes, no scleral icterus, no conjunctival pallor. Heart: Regular rate and rhythm, S1/S2 +, no murmurs, rubs, gallops. Lungs: Clear to auscultation bilaterally. No wheezing, no rhonchi, no rales.  Abdomen: Soft, nontender, nondistended, positive bowel sounds. Extremities: No clubbing or cyanosis, no pitting edema,  positive pedal pulses. Neuro: Grossly nonfocal.  Lab Results:  Basic Metabolic Panel:    Component Value Date/Time   NA 140 04/21/2011 0505   K 3.4* 04/21/2011 0505   CL 111 04/21/2011 0505   CO2 20 04/21/2011 0505   BUN 29* 04/21/2011 0505   CREATININE 1.90* 04/21/2011 0505   GLUCOSE 102* 04/21/2011 0505   CALCIUM 8.8 04/21/2011 0505   CBC:    Component Value Date/Time   WBC 13.6* 04/21/2011 0505   HGB 7.9* 04/21/2011 0505   HCT 23.0* 04/21/2011 0505   PLT 293 04/21/2011 0505   MCV 85.5 04/21/2011 0505   NEUTROABS 11.5* 04/18/2011 2131   LYMPHSABS 0.8 04/18/2011 2131   MONOABS 0.5 04/18/2011 2131   EOSABS 0.0 04/18/2011 2131   BASOSABS 0.0 04/18/2011 2131    Lab 04/21/11 0505 04/20/11 0535 04/19/11 0322 04/18/11 2131  WBC 13.6* 13.1* 12.8* 12.8*  HGB 7.9* 7.4* 8.8* 9.2*  HCT 23.0* 21.0*  25.5* 25.7*  PLT 293 259 273 267  MCV 85.5 85.0 85.6 85.1  MCH 29.4 30.0 29.5 30.5  MCHC 34.3 35.2 34.5 35.8  RDW 16.3* 15.9* 15.7* 15.4  LYMPHSABS -- -- -- 0.8  MONOABS -- -- -- 0.5  EOSABS -- -- -- 0.0  BASOSABS -- -- -- 0.0  BANDABS -- -- -- --    Lab 04/21/11 0505 04/20/11 0535 04/19/11 0322 04/18/11 2131  NA 140 142 145 143  K 3.4* 3.4* 3.5 3.4*  CL 111 115* 115* 111  CO2 20 18* 16* 17*  GLUCOSE 102* 123* 93 100*  BUN 29* 48* 68* 73*  CREATININE 1.90* 2.53* 3.13* 3.44*  CALCIUM 8.8 8.7 9.4 9.6  MG -- -- -- --    Lab 04/18/11 2131  INR 1.10  PROTIME --   Cardiac markers:  Lab 04/19/11 1911 04/19/11 1543 04/19/11 0900  CKMB 47.7* 62.2* 94.7*  TROPONINI 0.33* 0.35* 0.41*  MYOGLOBIN -- -- --   No components found with this basename: POCBNP:3 Recent Results (from the past 240 hour(s))  CULTURE, BLOOD (ROUTINE X 2)     Status: Normal (Preliminary result)   Collection Time   04/18/11 11:35 PM      Component Value Range Status Comment   Specimen Description  BLOOD LEFT ARM   Final    Special Requests BOTTLES DRAWN AEROBIC ONLY 2CC   Final    Culture  Setup Time [id number]   Final    Culture     Final    Value:        BLOOD CULTURE RECEIVED NO GROWTH TO DATE CULTURE WILL BE HELD FOR 5 DAYS BEFORE ISSUING A FINAL NEGATIVE REPORT   Report Status PENDING   Incomplete   CULTURE, BLOOD (ROUTINE X 2)     Status: Normal (Preliminary result)   Collection Time   04/18/11 11:40 PM      Component Value Range Status Comment   Specimen Description BLOOD RIGHT HAND   Final    Special Requests BOTTLES DRAWN AEROBIC ONLY 1CC   Final    Culture  Setup Time [id number]   Final    Culture     Final    Value:        BLOOD CULTURE RECEIVED NO GROWTH TO DATE CULTURE WILL BE HELD FOR 5 DAYS BEFORE ISSUING A FINAL NEGATIVE REPORT   Report Status PENDING   Incomplete    Studies/Results: No results found.  Medications: Scheduled Meds:   . amLODipine  5 mg Oral q morning - 10a  .  antiseptic oral rinse  15 mL Mouth Rinse BID  . cholecalciferol  1,000 Units Oral q morning - 10a  . cycloSPORINE  1 drop Both Eyes BID  . feeding supplement  237 mL Oral BID WC  . ferrous sulfate  325 mg Oral Q breakfast  . moxifloxacin  400 mg Intravenous Q24H  . pantoprazole  40 mg Oral Q1200  . potassium chloride  20 mEq Oral Once  . senna  1 tablet Oral BID  . sodium chloride  3 mL Intravenous Q12H  . Tamsulosin HCl  0.4 mg Oral q morning - 10a  . DISCONTD: albuterol  2.5 mg Nebulization Q6H  . DISCONTD: enoxaparin  30 mg Subcutaneous Q24H  . DISCONTD: piperacillin-tazobactam (ZOSYN)  IV  2.25 g Intravenous Q6H  . DISCONTD: vancomycin  750 mg Intravenous Q48H   Continuous Infusions:   . sodium chloride 75 mL/hr at 04/21/11 0700   PRN Meds:.acetaminophen, acetaminophen, albuterol, alum & mag hydroxide-simeth, HYDROmorphone, loperamide, methocarbamol, ondansetron (ZOFRAN) IV, ondansetron, oxyCODONE, zolpidem  Assessment/Plan:  Principal Problem:  *Acute renal failure - secondary to rhabdomyolysis - now creatinine trending down - will continue to monitor BMP in AM - continue IVF and trend CK  Active Problems:  Parkinsonism - PT evaluation pending   CVA (cerebral infarction) - PT/OT pending at this time   Rhabdomyolysis - continue IVF and trend CK   Altered mental status - resolved at this time   Anemia - unclear etiology at this time - Hg continues to drop - will obtain CBC in AM - no transfusion indicated at this time   Fall - PT/OT pending   EDUCATION - test results and diagnostic studies were discussed with patient  - patient verbalized the understanding - questions were answered at the bedside and contact information was provided for additional questions or concerns   LOS: 3 days   MAGICK-Johnnie Moten 04/21/2011, 2:31 PM  [facility] Pager: [phone]

## 2011-04-21 NOTE — Progress Notes (Addendum)
CSW spoke with pt nephew regarding pt dc plans. Pt nephew stated pt has gone to assisted living for short term stays due to past surgical procedures for rehab. CSW and Pt nephew discussed the process of placement and the pending physical therapy evaluation. Pt nephew stated he has had more confusion since this hospitalization. Pt nephew and CSW discussed using words focused on assisted living for the rehab. .Clinical social worker continuing to follow pt to assist with pt dc plans and further csw needs.   Pt nephew also has concerns regarding pt driver licenses being reinstated 3 months ago.   Catha Gosselin, Theresia Majors  [phone] .04/21/2011 16:41pm

## 2011-04-21 NOTE — Progress Notes (Signed)
.  Clinical social worker completed patient psychosocial assessment, please see assessment in patient shadow chart.   Pt is from Independent living at [facility]. Per chart review and discussion with facility, pt may have potential disposition needs.  Facility has bed availability in snf at [facility] if needed before pt returns to independent apartment.   Pt was quite confused when we discussed doing rehab at the health care center, pt stated, " I wouldn't be very good at getting in the pool". CSW further explained physical therapy, and pt stated, " I dont know if I'll be able to do any of that, I may need to wait till i'm better to go home and then exercise."   CSW will continue to talk to patient and will contact patient family as csw was given permission to discuss further with family. Pt stated he will talk to pt nephew this evening.   CSW awaiting pt/ot evaluation. .Clinical social worker continuing to follow pt to assist with pt dc plans and further csw needs.   Catha Gosselin, Theresia Majors  [phone] .04/21/2011 15:51pm

## 2011-04-21 NOTE — Progress Notes (Signed)
Nursing Note  Patient was bladder scanned at 2358 and noted to have greater than 717 ml of urine in his bladder.  Per order, if scanned x3 with no residual or volume greater than 300 to reinsert foley.    MD notified and foley reinserted at 0010. 16 French, leg strap applied and 775 ml return into bag. Patient resting comfortably.

## 2011-04-21 NOTE — Evaluation (Signed)
Physical Therapy Evaluation and Discharge.  Patient Details Name: Vincent Potts MRN: [id number] DOB: Mar 04, 1924 Today's Date: 04/21/2011  Problem List:  Patient Active Problem List  Diagnoses  . Parkinsonism  . CVA (cerebral infarction)  . Hypertension  . Back pain  . Hearing loss  . Gait disorder  . Diverticulosis  . GERD (gastroesophageal reflux disease)  . Rhabdomyolysis  . Acute renal failure  . Altered mental status  . Anemia  . Fall  . Elevated CK  . Abnormal LFTs    Past Medical History:  Past Medical History  Diagnosis Date  . Thyroid disease   . Anemia   . Vitamin D deficiency disease   . Hyponatremia   . Anxiety   . Parkinsonism   . Hearing loss   . Hypertension   . PVC (premature ventricular contraction)   . CVA (cerebral infarction)   . GERD (gastroesophageal reflux disease)   . Diverticulosis   . Sciatica   . Back pain   . Gait disorder   . Cataract   . Edema   . Keratosis   . Sciatica   . Cystic disease of liver   . Paresthesia   . Inguinal hernia   . Hearing loss    Past Surgical History:  Past Surgical History  Procedure Date  . Prostate surgery     cancer  . Hernia repair   . Appendectomy     PT Assessment/Plan/Recommendation PT Assessment Clinical Impression Statement: Pt is an 78 y/o male admitted with acute renal failure with history of falling.  Pt reports most recent fall was result of passing out from dehydration. Pt does not present as a significant falls risk at this time.  Suggesting return to home with HHPT for home safety eval. No DME needs.   PT Recommendation/Assessment: All further PT needs can be met in the next venue of care PT Problem List: Decreased safety awareness;Decreased knowledge of use of DME;Other (comment) (Fall history) PT Therapy Diagnosis : Abnormality of gait PT Recommendation Follow Up Recommendations: Home health PT Equipment Recommended: None recommended by OT;None recommended by PT PT Goals      PT Evaluation Precautions/Restrictions  Precautions Precautions: Fall Precaution Comments: Pt reports 2 falls in last 6 months. Required Braces or Orthoses: No Restrictions Weight Bearing Restrictions: No Prior Functioning  Home Living Lives With: Alone Receives Help From: Other (Comment) (none.  Pt in Independent Living.) Type of Home: Apartment Home Layout: One level Home Access: Level entry Bathroom Shower/Tub: Tub/shower unit;Curtain Bathroom Toilet: Standard Bathroom Accessibility: Yes How Accessible: Accessible via walker Home Adaptive Equipment: Grab bars around toilet;Grab bars in shower;Walker - four wheeled Additional Comments: Pt uses rollator walker in building but not in his apartment.  Pt walks to dining room once a day on same level as his apartment. Prior Function Level of Independence: Needs assistance with homemaking;Independent with basic ADLs;Independent with gait;Independent with transfers Homemaking Assistance Comments: staff cleans apartment and changes sheets. 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 Cognition Cognition Arousal/Alertness: Awake/alert Overall Cognitive Status: Appears within functional limits for tasks assessed Cognition - Other Comments: Pt very HOH.  Seems overall cognitively intact. Takes care of all own finances and drives. Sensation/Coordination Sensation Light Touch: Appears Intact Coordination Gross Motor Movements are Fluid and Coordinated: Yes Fine Motor Movements are Fluid and Coordinated: Yes Extremity Assessment RUE Assessment RUE Assessment: Within Functional Limits LUE Assessment LUE Assessment: Within Functional Limits RLE Assessment RLE  Assessment: Within Functional Limits LLE Assessment LLE Assessment: Within Functional Limits Mobility (including Balance) Bed Mobility Bed Mobility: No Transfers Sit to Stand: 5: Supervision;Without upper  extremity assist;With armrests;From chair/3-in-1 (usually takes pt 2x to get up.) Sit to Stand Details (indicate cue type and reason): supervision for safety no assistance needed.  Stand to Sit: 5: Supervision;With armrests;To chair/3-in-1;Without upper extremity assist Stand to Sit Details: same as sit to stand Ambulation/Gait Ambulation/Gait: Yes Ambulation/Gait Assistance: 5: Supervision Ambulation/Gait Assistance Details (indicate cue type and reason): Pt required verbal cues to stay within the walker.  Repeated cues for cervical posture.   Ambulation Distance (Feet): 200 Feet Assistive device: Rolling walker Gait Pattern: Within Functional Limits Gait velocity: very fast cadance.   Stairs: No Wheelchair Mobility Wheelchair Mobility: No  Posture/Postural Control Posture/Postural Control: No significant limitations Balance Balance Assessed: No Exercise    End of Session PT - End of Session Equipment Utilized During Treatment: Gait belt Activity Tolerance: Patient tolerated treatment well Patient left: in chair;with call bell in reach Nurse Communication: Mobility status for transfers;Mobility status for ambulation General Behavior During Session: [facility] for tasks performed Cognition: [facility] for tasks performed  Maddi Collar 04/21/2011, 5:08 PM Azra Abrell L. Francely Craw DPT [phone]

## 2011-04-22 DIAGNOSIS — D696 Thrombocytopenia, unspecified: Secondary | ICD-10-CM | POA: Diagnosis not present

## 2011-04-22 DIAGNOSIS — T795XXA Traumatic anuria, initial encounter: Secondary | ICD-10-CM | POA: Diagnosis not present

## 2011-04-22 DIAGNOSIS — M6282 Rhabdomyolysis: Secondary | ICD-10-CM | POA: Diagnosis not present

## 2011-04-22 DIAGNOSIS — W010XXA Fall on same level from slipping, tripping and stumbling without subsequent striking against object, initial encounter: Secondary | ICD-10-CM | POA: Diagnosis not present

## 2011-04-22 LAB — BASIC METABOLIC PANEL
BUN: 21 mg/dL (ref 6–23)
CO2: 20 mEq/L (ref 19–32)
Calcium: 8.9 mg/dL (ref 8.4–10.5)
Chloride: 107 mEq/L (ref 96–112)
Creatinine, Ser: 1.66 mg/dL — ABNORMAL HIGH (ref 0.50–1.35)
Glucose, Bld: 87 mg/dL (ref 70–99)

## 2011-04-22 LAB — CBC
HCT: 23.5 % — ABNORMAL LOW (ref 39.0–52.0)
MCH: 29.5 pg (ref 26.0–34.0)
MCV: 85.5 fL (ref 78.0–100.0)
RBC: 2.75 MIL/uL — ABNORMAL LOW (ref 4.22–5.81)
WBC: 7.4 10*3/uL (ref 4.0–10.5)

## 2011-04-22 LAB — CARDIAC PANEL(CRET KIN+CKTOT+MB+TROPI): Relative Index: 0.5 (ref 0.0–2.5)

## 2011-04-22 NOTE — Progress Notes (Signed)
SPOKE WITH THE PT THAT STATES THAT HE IS OK WITH A HH PT SAFETY EVAL AS HE LIVES ALONE BUT HIS NEPHEW CHECKS ON HIM DAILY AND DOESN'T STAY TOO FAR AWAY.  [facility] NOTIFIED OF NEEDS. Willa Rough 04/22/2011 [phone] OR [phone]

## 2011-04-22 NOTE — Progress Notes (Signed)
Patient ID: Vincent Potts, male   DOB: 1924/07/08, 78 y.o.   MRN: [id number]  Subjective: No events overnight. Patient denies chest pain, shortness of breath, abdominal pain. Had bowel movement and reports ambulating.  Objective:  Vital signs in last 24 hours:  Filed Vitals:   04/21/11 1400 04/21/11 2055 04/22/11 0501 04/22/11 0945  BP: 127/73 127/68 150/77 165/80  Pulse: 86 86 76 88  Temp: 98 F (36.7 C) 97.7 F (36.5 C) 97.3 F (36.3 C) 97.4 F (36.3 C)  TempSrc: Oral Oral Oral Oral  Resp: 20 20 20 18   Height:      Weight:      SpO2: 100% 96% 97% 96%    Intake/Output from previous day:   Intake/Output Summary (Last 24 hours) at 04/22/11 1206 Last data filed at 04/22/11 1100  Gross per 24 hour  Intake 1482.5 ml  Output   3327 ml  Net -1844.5 ml    Physical Exam: General: Alert, awake, oriented x3, in no acute distress. HEENT: No bruits, no goiter. Moist mucous membranes, no scleral icterus, no conjunctival pallor. Heart: Regular rate and rhythm, S1/S2 +, no murmurs, rubs, gallops. Lungs: Clear to auscultation bilaterally. No wheezing, no rhonchi, no rales.  Abdomen: Soft, nontender, nondistended, positive bowel sounds. Extremities: No clubbing or cyanosis, no pitting edema,  positive pedal pulses. Neuro: Grossly nonfocal.  Lab Results:  Basic Metabolic Panel:    Component Value Date/Time   NA 135 04/22/2011 0520   K 4.2 04/22/2011 0520   CL 107 04/22/2011 0520   CO2 20 04/22/2011 0520   BUN 21 04/22/2011 0520   CREATININE 1.66* 04/22/2011 0520   GLUCOSE 87 04/22/2011 0520   CALCIUM 8.9 04/22/2011 0520   CBC:    Component Value Date/Time   WBC 7.4 04/22/2011 0520   HGB 8.1* 04/22/2011 0520   HCT 23.5* 04/22/2011 0520   PLT 284 04/22/2011 0520   MCV 85.5 04/22/2011 0520   NEUTROABS 11.5* 04/18/2011 2131   LYMPHSABS 0.8 04/18/2011 2131   MONOABS 0.5 04/18/2011 2131   EOSABS 0.0 04/18/2011 2131   BASOSABS 0.0 04/18/2011 2131      Lab 04/22/11 0520 04/21/11 0505  04/20/11 0535 04/19/11 0322 04/18/11 2131  WBC 7.4 13.6* 13.1* 12.8* 12.8*  HGB 8.1* 7.9* 7.4* 8.8* 9.2*  HCT 23.5* 23.0* 21.0* 25.5* 25.7*  PLT 284 293 259 273 267  MCV 85.5 85.5 85.0 85.6 85.1  MCH 29.5 29.4 30.0 29.5 30.5  MCHC 34.5 34.3 35.2 34.5 35.8  RDW 16.3* 16.3* 15.9* 15.7* 15.4  LYMPHSABS -- -- -- -- 0.8  MONOABS -- -- -- -- 0.5  EOSABS -- -- -- -- 0.0  BASOSABS -- -- -- -- 0.0  BANDABS -- -- -- -- --    Lab 04/22/11 0520 04/21/11 0505 04/20/11 0535 04/19/11 0322 04/18/11 2131  NA 135 140 142 145 143  K 4.2 3.4* 3.4* 3.5 3.4*  CL 107 111 115* 115* 111  CO2 20 20 18* 16* 17*  GLUCOSE 87 102* 123* 93 100*  BUN 21 29* 48* 68* 73*  CREATININE 1.66* 1.90* 2.53* 3.13* 3.44*  CALCIUM 8.9 8.8 8.7 9.4 9.6  MG -- -- -- -- --    Lab 04/18/11 2131  INR 1.10  PROTIME --   Cardiac markers:  Lab 04/22/11 0520 04/19/11 1911 04/19/11 1543 04/19/11 0900  CKMB 8.2* 47.7* 62.2* --  TROPONINI -- 0.33* 0.35* 0.41*  MYOGLOBIN -- -- -- --   No components found with this basename:  POCBNP:3 Recent Results (from the past 240 hour(s))  CULTURE, BLOOD (ROUTINE X 2)     Status: Normal (Preliminary result)   Collection Time   04/18/11 11:35 PM      Component Value Range Status Comment   Specimen Description BLOOD LEFT ARM   Final    Special Requests BOTTLES DRAWN AEROBIC ONLY 2CC   Final    Culture  Setup Time [id number]   Final    Culture     Final    Value:        BLOOD CULTURE RECEIVED NO GROWTH TO DATE CULTURE WILL BE HELD FOR 5 DAYS BEFORE ISSUING A FINAL NEGATIVE REPORT   Report Status PENDING   Incomplete   CULTURE, BLOOD (ROUTINE X 2)     Status: Normal (Preliminary result)   Collection Time   04/18/11 11:40 PM      Component Value Range Status Comment   Specimen Description BLOOD RIGHT HAND   Final    Special Requests BOTTLES DRAWN AEROBIC ONLY 1CC   Final    Culture  Setup Time [id number]   Final    Culture     Final    Value:        BLOOD CULTURE RECEIVED NO GROWTH  TO DATE CULTURE WILL BE HELD FOR 5 DAYS BEFORE ISSUING A FINAL NEGATIVE REPORT   Report Status PENDING   Incomplete     Studies/Results: No results found.  Medications: Scheduled Meds:   . amLODipine  5 mg Oral q morning - 10a  . antiseptic oral rinse  15 mL Mouth Rinse BID  . cholecalciferol  1,000 Units Oral q morning - 10a  . cycloSPORINE  1 drop Both Eyes BID  . feeding supplement  237 mL Oral BID WC  . ferrous sulfate  325 mg Oral Q breakfast  . moxifloxacin  400 mg Intravenous Q24H  . pantoprazole  40 mg Oral Q1200  . potassium chloride  40 mEq Oral Once  . senna  1 tablet Oral BID  . sodium chloride  3 mL Intravenous Q12H  . Tamsulosin HCl  0.4 mg Oral q morning - 10a   Continuous Infusions:   . sodium chloride 75 mL/hr at 04/22/11 0945   PRN Meds:.acetaminophen, acetaminophen, albuterol, alum & mag hydroxide-simeth, HYDROmorphone, loperamide, methocarbamol, ondansetron (ZOFRAN) IV, ondansetron, oxyCODONE, zolpidem  Assessment/Plan:  Principal Problem:  *Acute renal failure  - secondary to rhabdomyolysis  - now creatinine trending down  - will continue to monitor BMP in AM  - continue IVF and trend CK   Active Problems:  Parkinsonism  - PT HH CVA (cerebral infarction)  - PT HH  Rhabdomyolysis  - continue IVF and trend CK  - clinically improving  Altered mental status  - resolved at this time   Anemia  - unclear etiology at this time  - Hg remains stable and at pt's baseline - will obtain CBC in AM  - no transfusion indicated at this time   Fall  - PT at home to continue   EDUCATION - test results and diagnostic studies were discussed with patient and pt's family who was present at the bedside (son-in-law who is POA) - patient and family have verbalized the understanding - questions were answered at the bedside and contact information was provided for additional questions or concerns   LOS: 4 days   MAGICK-Siera Beyersdorf 04/22/2011, 12:06  PM  [facility] Pager: [phone]

## 2011-04-23 DIAGNOSIS — R269 Unspecified abnormalities of gait and mobility: Secondary | ICD-10-CM | POA: Diagnosis not present

## 2011-04-23 DIAGNOSIS — L8992 Pressure ulcer of unspecified site, stage 2: Secondary | ICD-10-CM | POA: Diagnosis not present

## 2011-04-23 DIAGNOSIS — M6282 Rhabdomyolysis: Secondary | ICD-10-CM | POA: Diagnosis not present

## 2011-04-23 DIAGNOSIS — M545 Low back pain, unspecified: Secondary | ICD-10-CM | POA: Diagnosis not present

## 2011-04-23 DIAGNOSIS — J189 Pneumonia, unspecified organism: Secondary | ICD-10-CM | POA: Diagnosis not present

## 2011-04-23 DIAGNOSIS — W010XXA Fall on same level from slipping, tripping and stumbling without subsequent striking against object, initial encounter: Secondary | ICD-10-CM | POA: Diagnosis not present

## 2011-04-23 DIAGNOSIS — M6281 Muscle weakness (generalized): Secondary | ICD-10-CM | POA: Diagnosis not present

## 2011-04-23 DIAGNOSIS — K2100 Gastro-esophageal reflux disease with esophagitis, without bleeding: Secondary | ICD-10-CM | POA: Diagnosis not present

## 2011-04-23 DIAGNOSIS — I10 Essential (primary) hypertension: Secondary | ICD-10-CM | POA: Diagnosis not present

## 2011-04-23 DIAGNOSIS — IMO0002 Reserved for concepts with insufficient information to code with codable children: Secondary | ICD-10-CM | POA: Diagnosis not present

## 2011-04-23 DIAGNOSIS — R471 Dysarthria and anarthria: Secondary | ICD-10-CM | POA: Diagnosis not present

## 2011-04-23 DIAGNOSIS — I679 Cerebrovascular disease, unspecified: Secondary | ICD-10-CM | POA: Diagnosis not present

## 2011-04-23 DIAGNOSIS — N183 Chronic kidney disease, stage 3 unspecified: Secondary | ICD-10-CM | POA: Diagnosis not present

## 2011-04-23 DIAGNOSIS — D696 Thrombocytopenia, unspecified: Secondary | ICD-10-CM | POA: Diagnosis not present

## 2011-04-23 DIAGNOSIS — D649 Anemia, unspecified: Secondary | ICD-10-CM | POA: Diagnosis not present

## 2011-04-23 DIAGNOSIS — T795XXA Traumatic anuria, initial encounter: Secondary | ICD-10-CM | POA: Diagnosis not present

## 2011-04-23 DIAGNOSIS — N179 Acute kidney failure, unspecified: Secondary | ICD-10-CM | POA: Diagnosis not present

## 2011-04-23 LAB — CARDIAC PANEL(CRET KIN+CKTOT+MB+TROPI)
CK, MB: 7.1 ng/mL (ref 0.3–4.0)
Relative Index: 0.7 (ref 0.0–2.5)
Total CK: 1006 U/L — ABNORMAL HIGH (ref 7–232)
Troponin I: <0.3 ng/mL (ref <0.30)

## 2011-04-23 LAB — CBC
HCT: 28.2 % — ABNORMAL LOW (ref 39.0–52.0)
MCHC: 34 g/dL (ref 30.0–36.0)
Platelets: 366 10*3/uL (ref 150–400)
RDW: 15.7 % — ABNORMAL HIGH (ref 11.5–15.5)

## 2011-04-23 LAB — BASIC METABOLIC PANEL
BUN: 20 mg/dL (ref 6–23)
GFR calc Af Amer: 42 mL/min — ABNORMAL LOW (ref >90)
GFR calc non Af Amer: 36 mL/min — ABNORMAL LOW (ref >90)
Potassium: 3.9 mEq/L (ref 3.5–5.1)
Sodium: 136 mEq/L (ref 135–145)

## 2011-04-23 MED ORDER — OXYCODONE HCL 5 MG PO TABS: 5.0000 mg | ORAL_TABLET | ORAL | Status: AC | PRN

## 2011-04-23 MED ORDER — MOXIFLOXACIN HCL 400 MG PO TABS: 400.0000 mg | ORAL_TABLET | Freq: Every day | ORAL | Status: DC

## 2011-04-23 MED ORDER — MOXIFLOXACIN HCL 400 MG PO TABS: 400.0000 mg | ORAL_TABLET | Freq: Every day | ORAL | Status: AC

## 2011-04-23 MED ORDER — ENSURE CLINICAL ST REVIGOR PO LIQD: 237.0000 mL | Freq: Two times a day (BID) | ORAL | Status: DC

## 2011-04-23 MED ORDER — OXYCODONE HCL 5 MG PO TABS: 5.0000 mg | ORAL_TABLET | ORAL | Status: DC | PRN

## 2011-04-23 NOTE — Discharge Instructions (Signed)
Rhabdomyolysis Rhabdomyolysis is the breakdown of muscle fibers due to injury. The injury may come from physical damage to the muscle like an injury but other causes are:  High fever (hyperthermia).   Seizures (convulsions).   Low phosphate levels.   Diseases of metabolism.   Heatstroke.   Drug toxicity.   Over exertion.   Alcoholism.   Muscle is cut off from oxygen (anoxia).   The squeezing of nerves and blood vessels (compartment syndrome).  Some drugs which may cause the breakdown of muscle are:  Antibiotics.   Statins.   Alcohol.   Animal toxins.  Myoglobin is a substance which helps muscle use oxygen. When the muscle is damaged, the myoglobin is released into the bloodstream. It is filtered out of the bloodstream by the kidneys. Myoglobin may block up the kidneys. This may cause damage, such as kidney failure. It also breaks down into other damaging toxic parts, which also cause kidney failure.  SYMPTOMS   Dark, red, or tea colored urine.   Weakness of affected muscles.   Weight gain from water retention.   Joint aches and pains.   Irregular heart from high potassium in the blood.   Muscle tenderness or aching.   Generalized weakness.   Seizures.   Feeling tired (fatigue).  DIAGNOSIS  Your caregiver may find muscle tenderness on exam and suspect the problem. Urine tests and blood work can confirm the problem. TREATMENT   Early and aggressive treatment with large amounts of fluids may help prevent kidney failure.   Water producing medicine (diuretic) may be used to help flush the kidneys.   High potassium and calcium problems (electrolyte) in your blood may need treatment.    HOME CARE INSTRUCTIONS  This problem is usually cared for in a hospital. If you are allowed to go home and require dialysis, make sure you keep all appointments for lab work and dialysis. Not doing so could result in death. Document Released: 01/29/2004 Document Revised:  10/28/2010 Document Reviewed: 08/12/2008 [employer] Patient Information 2012 [employer]. 

## 2011-04-23 NOTE — Discharge Summary (Addendum)
Patient ID: Vincent Potts MRN: [id number] DOB/AGE: 10/24/1924 78 y.o.  Admit date: 04/18/2011 Discharge date: 04/23/2011  Primary Care Physician:  Vincent Relic, MD, MD  Discharge Diagnoses:    Present on Admission:  .Rhabdomyolysis .Acute renal failure .Altered mental status .Anemia .Fall .Elevated CK .Abnormal LFTs .CVA (cerebral infarction) .Parkinsonism  Principal Problem:  *Acute renal failure Active Problems:  Parkinsonism  CVA (cerebral infarction)  Rhabdomyolysis  Altered mental status  Anemia  Fall  Elevated CK  Abnormal LFTs   Medication List  As of 04/23/2011  2:24 PM   STOP taking these medications         lisinopril 10 MG tablet         TAKE these medications         amLODipine 5 MG tablet   Commonly known as: NORVASC   Take 5 mg by mouth every morning.      aspirin 325 MG tablet   Take 325 mg by mouth every morning.      cholecalciferol 1000 UNITS tablet   Commonly known as: VITAMIN D   Take 1,000 Units by mouth every morning.      cycloSPORINE 0.05 % ophthalmic emulsion   Commonly known as: RESTASIS   Place 1 drop into both eyes 2 (two) times daily.      feeding supplement Liqd   Take 237 mLs by mouth 2 (two) times daily with a meal.      ferrous sulfate 325 (65 FE) MG tablet   Take 325 mg by mouth daily with breakfast.      lisinopril 20 MG tablet   Commonly known as: PRINIVIL,ZESTRIL   Take 20 mg by mouth every morning.      moxifloxacin 400 MG tablet   Commonly known as: AVELOX   Take 1 tablet (400 mg total) by mouth daily.      omeprazole 20 MG capsule   Commonly known as: PRILOSEC   Take 20 mg by mouth every morning.      oxyCODONE 5 MG immediate release tablet   Commonly known as: Oxy IR/ROXICODONE   Take 1 tablet (5 mg total) by mouth every 4 (four) hours as needed.      senna 8.6 MG tablet   Commonly known as: SENOKOT   Take 1 tablet by mouth 2 (two) times daily.      Tamsulosin HCl 0.4 MG Caps   Commonly  known as: FLOMAX   Take 0.4 mg by mouth every morning.      temazepam 30 MG capsule   Commonly known as: RESTORIL   Take 30 mg by mouth at bedtime as needed. For insomnia            Disposition and Follow-up: Pt will need to be seen within 2 weeks of discharge. He will also need to have BMP checked to ensure that Creatinine is remaining stable and at his baseline 1.2-1.7. May also need to check CK to ensure that is has normalized. Please note the nice trending down was noted during the hospitalization.  Consults: none  Significant Diagnostic Studies:  Ct Head Wo Contrast 04/18/2011  IMPRESSION:  1.  No skull fracture or intracranial hemorrhage. 2.  Stable atrophy.  CT CERVICAL SPINE  Findings: Levoconvex scoliosis.  Multilevel degenerative changes. No prevertebral soft tissue swelling, fractures or subluxations. Mild bilateral carotid artery atheromatous calcification.  Mild biapical pleural and parenchymal scarring.  IMPRESSION:  1.  No fracture or subluxation. 2.  Multilevel degenerative changes. 3.  Mild bilateral carotid artery atheromatous calcification.    Ct Cervical Spine Wo Contrast 04/18/2011   IMPRESSION:  1.  No skull fracture or intracranial hemorrhage. 2.  Stable atrophy.  CT CERVICAL SPINE  Findings: Levoconvex scoliosis.  Multilevel degenerative changes. No prevertebral soft tissue swelling, fractures or subluxations. Mild bilateral carotid artery atheromatous calcification.  Mild biapical pleural and parenchymal scarring.  IMPRESSION:  1.  No fracture or subluxation. 2.  Multilevel degenerative changes. 3.  Mild bilateral carotid artery atheromatous calcification.    Dg Chest Portable 1 View 04/18/2011    IMPRESSION: No acute displaced rib fractures seen; mild left perihilar airspace opacity may reflect atelectasis or possibly pneumonia.     Brief H and P: Vincent Potts is an 78 y.o. male resident of an Assisted Living Facility who has a history of Parkinson's Disease and  gait disorder and who was found on the floor after a prolonged period of time. He was last seen at his baseline the day before. The patient reports tripping and falling and not being able to get up. He denied having chest pain or dizziness associated with the fall. He does not know how long he was on the floor. In the ED he was found to have a CK level of 11K, as well as an elevated BUN and creatinine. The chest X-Ray that was done revealed pneumonia.   Physical Exam on Discharge:  Filed Vitals:   04/23/11 0300 04/23/11 0537 04/23/11 0935 04/23/11 1411  BP: 155/94 153/93 130/76 117/75  Pulse: 106 121 102 60  Temp:  97.8 F (36.6 C) 97.9 F (36.6 C) 98.3 F (36.8 C)  TempSrc:  Oral Oral Oral  Resp: 18 20 21 20   Height:      Weight:      SpO2: 97% 96% 98% 98%     Intake/Output Summary (Last 24 hours) at 04/23/11 1424 Last data filed at 04/23/11 1400  Gross per 24 hour  Intake 2724.17 ml  Output   1225 ml  Net 1499.17 ml    General: Alert, awake, oriented to name and place, year but not exact date, in no acute distress. HEENT: No bruits, no goiter. Heart: Regular rate and rhythm, without murmurs, rubs, gallops. Lungs: Clear to auscultation bilaterally. Abdomen: Soft, nontender, nondistended, positive bowel sounds. Extremities: No clubbing cyanosis or edema with positive pedal pulses. Neuro: Grossly intact, nonfocal.  CBC:    Component Value Date/Time   WBC 13.4* 04/23/2011 0528   HGB 9.6* 04/23/2011 0528   HCT 28.2* 04/23/2011 0528   PLT 366 04/23/2011 0528   MCV 85.7 04/23/2011 0528   NEUTROABS 11.5* 04/18/2011 2131   LYMPHSABS 0.8 04/18/2011 2131   MONOABS 0.5 04/18/2011 2131   EOSABS 0.0 04/18/2011 2131   BASOSABS 0.0 04/18/2011 2131    Basic Metabolic Panel:    Component Value Date/Time   NA 136 04/23/2011 0528   K 3.9 04/23/2011 0528   CL 104 04/23/2011 0528   CO2 20 04/23/2011 0528   BUN 20 04/23/2011 0528   CREATININE 1.65* 04/23/2011 0528   GLUCOSE 82 04/23/2011 0528     CALCIUM 9.0 04/23/2011 0528     Lab 04/23/11 0528 04/22/11 0520 04/21/11 0505 04/20/11 0535 04/19/11 0322 04/18/11 2131  WBC 13.4* 7.4 13.6* 13.1* 12.8* --  HGB 9.6* 8.1* 7.9* 7.4* 8.8* --  HCT 28.2* 23.5* 23.0* 21.0* 25.5* --  PLT 366 284 293 259 273 --  MCV 85.7 85.5 85.5 85.0 85.6 --  MCH 29.2 29.5  29.4 30.0 29.5 --  MCHC 34.0 34.5 34.3 35.2 34.5 --  RDW 15.7* 16.3* 16.3* 15.9* 15.7* --  LYMPHSABS -- -- -- -- -- 0.8  MONOABS -- -- -- -- -- 0.5  EOSABS -- -- -- -- -- 0.0  BASOSABS -- -- -- -- -- 0.0  BANDABS -- -- -- -- -- --    Lab 04/23/11 0528 04/22/11 0520 04/21/11 0505 04/20/11 0535 04/19/11 0322  NA 136 135 140 142 145  K 3.9 4.2 3.4* 3.4* 3.5  CL 104 107 111 115* 115*  CO2 20 20 20  18* 16*  GLUCOSE 82 87 102* 123* 93  BUN 20 21 29* 48* 68*  CREATININE 1.65* 1.66* 1.90* 2.53* 3.13*  CALCIUM 9.0 8.9 8.8 8.7 9.4  MG -- -- -- -- --    Lab 04/18/11 2131  INR 1.10  PROTIME --   Cardiac markers:  Lab 04/23/11 0500 04/22/11 1323 04/22/11 0520 04/19/11 1911  CKMB 7.1* 7.6* 8.2* --  TROPONINI <0.30 <0.30 -- 0.33*  MYOGLOBIN -- -- -- --    Hospital Course:  Principal Problem:  *Acute renal failure - this was thought to be secondary to rhabdomyolysis - creatinine was trending down as have provided adequate IVF - creatinine today and over 48 hour period has remained stable and at pt's baseline - this will have to be rechecked in 2-3 weeks  Active Problems:  Parkinsonism - stable during the hospitalization   Rhabdomyolysis - secondary to fall and prolonged immobilization - pt will continue with PT once discharge - CK trended from 6000 --> 1000 on discharge - this will need to be rechecked to ensure that it remains stable   Altered mental status - secondary to rhabdomyolysis - resolved   Leukocytosis - new lab finding today and unclear if lab error, this will need to be repeated 1-2 weeks upon discharge - patient has been afebrile in last 48 hours with  no active complaints of dysuria, diarrhea or cough or shortness of breath. Doubt that this is an infection as there is no obvious source - will route note to PCP to make sure to check CBC in 1-2 weeks  Time spent on Discharge: Over 30 minutes  Signed: Debbora Presto 04/23/2011, 2:24 PM  [facility] [phone]

## 2011-04-23 NOTE — Progress Notes (Signed)
Pt to d/c to [facility] today and will be transported by his nephew (craig).

## 2011-04-23 NOTE — Progress Notes (Signed)
Patient confused and getting out of bed. Patient not sleeping; gave Ambien at 01:49 per MD prn order for insomnia. Tried to reorient patient, dim lights and stay in room with patient, he still continues to climb out of bed. Patient is a high fall risk. At 3:50 Maren Reamer, NP notified. Will place restraints per order. Explained to patient about restraints but patient confused. Family not available at this time. Will continue to monitor. Steele Berg  RN

## 2011-04-23 NOTE — Progress Notes (Signed)
Utilization Review Completed.Vincent Potts T2/22/2013   

## 2011-04-25 LAB — CULTURE, BLOOD (ROUTINE X 2)
Culture  Setup Time: 201302180749
Culture  Setup Time: 201302180749
Culture: NO GROWTH

## 2011-04-26 DIAGNOSIS — M6282 Rhabdomyolysis: Secondary | ICD-10-CM | POA: Diagnosis not present

## 2011-04-26 DIAGNOSIS — I10 Essential (primary) hypertension: Secondary | ICD-10-CM | POA: Diagnosis not present

## 2011-04-26 DIAGNOSIS — N179 Acute kidney failure, unspecified: Secondary | ICD-10-CM | POA: Diagnosis not present

## 2011-04-26 DIAGNOSIS — J189 Pneumonia, unspecified organism: Secondary | ICD-10-CM | POA: Diagnosis not present

## 2011-04-26 DIAGNOSIS — D649 Anemia, unspecified: Secondary | ICD-10-CM | POA: Diagnosis not present

## 2011-04-30 DIAGNOSIS — I10 Essential (primary) hypertension: Secondary | ICD-10-CM | POA: Diagnosis not present

## 2011-04-30 DIAGNOSIS — D649 Anemia, unspecified: Secondary | ICD-10-CM | POA: Diagnosis not present

## 2011-04-30 DIAGNOSIS — R269 Unspecified abnormalities of gait and mobility: Secondary | ICD-10-CM | POA: Diagnosis not present

## 2011-04-30 DIAGNOSIS — N179 Acute kidney failure, unspecified: Secondary | ICD-10-CM | POA: Diagnosis not present

## 2011-05-01 DIAGNOSIS — D649 Anemia, unspecified: Secondary | ICD-10-CM | POA: Diagnosis not present

## 2011-05-04 DIAGNOSIS — R269 Unspecified abnormalities of gait and mobility: Secondary | ICD-10-CM | POA: Diagnosis not present

## 2011-05-04 DIAGNOSIS — I10 Essential (primary) hypertension: Secondary | ICD-10-CM | POA: Diagnosis not present

## 2011-05-04 DIAGNOSIS — D649 Anemia, unspecified: Secondary | ICD-10-CM | POA: Diagnosis not present

## 2011-05-25 DIAGNOSIS — D649 Anemia, unspecified: Secondary | ICD-10-CM | POA: Diagnosis not present

## 2011-05-25 DIAGNOSIS — L8992 Pressure ulcer of unspecified site, stage 2: Secondary | ICD-10-CM | POA: Diagnosis not present

## 2011-05-25 DIAGNOSIS — I10 Essential (primary) hypertension: Secondary | ICD-10-CM | POA: Diagnosis not present

## 2011-05-25 DIAGNOSIS — IMO0002 Reserved for concepts with insufficient information to code with codable children: Secondary | ICD-10-CM | POA: Diagnosis not present

## 2011-05-25 DIAGNOSIS — N179 Acute kidney failure, unspecified: Secondary | ICD-10-CM | POA: Diagnosis not present

## 2011-05-31 DIAGNOSIS — M064 Inflammatory polyarthropathy: Secondary | ICD-10-CM | POA: Diagnosis not present

## 2011-05-31 DIAGNOSIS — R279 Unspecified lack of coordination: Secondary | ICD-10-CM | POA: Diagnosis not present

## 2011-05-31 DIAGNOSIS — M6281 Muscle weakness (generalized): Secondary | ICD-10-CM | POA: Diagnosis not present

## 2011-06-01 DIAGNOSIS — M6281 Muscle weakness (generalized): Secondary | ICD-10-CM | POA: Diagnosis not present

## 2011-06-01 DIAGNOSIS — R279 Unspecified lack of coordination: Secondary | ICD-10-CM | POA: Diagnosis not present

## 2011-06-01 DIAGNOSIS — M064 Inflammatory polyarthropathy: Secondary | ICD-10-CM | POA: Diagnosis not present

## 2011-06-04 DIAGNOSIS — R279 Unspecified lack of coordination: Secondary | ICD-10-CM | POA: Diagnosis not present

## 2011-06-04 DIAGNOSIS — M6281 Muscle weakness (generalized): Secondary | ICD-10-CM | POA: Diagnosis not present

## 2011-06-04 DIAGNOSIS — M064 Inflammatory polyarthropathy: Secondary | ICD-10-CM | POA: Diagnosis not present

## 2011-06-05 DIAGNOSIS — M064 Inflammatory polyarthropathy: Secondary | ICD-10-CM | POA: Diagnosis not present

## 2011-06-05 DIAGNOSIS — M6281 Muscle weakness (generalized): Secondary | ICD-10-CM | POA: Diagnosis not present

## 2011-06-05 DIAGNOSIS — R279 Unspecified lack of coordination: Secondary | ICD-10-CM | POA: Diagnosis not present

## 2011-06-07 DIAGNOSIS — M6281 Muscle weakness (generalized): Secondary | ICD-10-CM | POA: Diagnosis not present

## 2011-06-07 DIAGNOSIS — M064 Inflammatory polyarthropathy: Secondary | ICD-10-CM | POA: Diagnosis not present

## 2011-06-07 DIAGNOSIS — R279 Unspecified lack of coordination: Secondary | ICD-10-CM | POA: Diagnosis not present

## 2011-06-08 DIAGNOSIS — D649 Anemia, unspecified: Secondary | ICD-10-CM | POA: Diagnosis not present

## 2011-06-08 DIAGNOSIS — I10 Essential (primary) hypertension: Secondary | ICD-10-CM | POA: Diagnosis not present

## 2011-06-08 DIAGNOSIS — M064 Inflammatory polyarthropathy: Secondary | ICD-10-CM | POA: Diagnosis not present

## 2011-06-08 DIAGNOSIS — R279 Unspecified lack of coordination: Secondary | ICD-10-CM | POA: Diagnosis not present

## 2011-06-08 DIAGNOSIS — M6281 Muscle weakness (generalized): Secondary | ICD-10-CM | POA: Diagnosis not present

## 2011-06-09 DIAGNOSIS — R279 Unspecified lack of coordination: Secondary | ICD-10-CM | POA: Diagnosis not present

## 2011-06-09 DIAGNOSIS — M064 Inflammatory polyarthropathy: Secondary | ICD-10-CM | POA: Diagnosis not present

## 2011-06-09 DIAGNOSIS — M6281 Muscle weakness (generalized): Secondary | ICD-10-CM | POA: Diagnosis not present

## 2011-06-10 DIAGNOSIS — M064 Inflammatory polyarthropathy: Secondary | ICD-10-CM | POA: Diagnosis not present

## 2011-06-10 DIAGNOSIS — R279 Unspecified lack of coordination: Secondary | ICD-10-CM | POA: Diagnosis not present

## 2011-06-10 DIAGNOSIS — M6281 Muscle weakness (generalized): Secondary | ICD-10-CM | POA: Diagnosis not present

## 2011-06-11 DIAGNOSIS — R279 Unspecified lack of coordination: Secondary | ICD-10-CM | POA: Diagnosis not present

## 2011-06-11 DIAGNOSIS — M064 Inflammatory polyarthropathy: Secondary | ICD-10-CM | POA: Diagnosis not present

## 2011-06-11 DIAGNOSIS — M6281 Muscle weakness (generalized): Secondary | ICD-10-CM | POA: Diagnosis not present

## 2011-06-14 DIAGNOSIS — M6281 Muscle weakness (generalized): Secondary | ICD-10-CM | POA: Diagnosis not present

## 2011-06-14 DIAGNOSIS — M064 Inflammatory polyarthropathy: Secondary | ICD-10-CM | POA: Diagnosis not present

## 2011-06-14 DIAGNOSIS — R279 Unspecified lack of coordination: Secondary | ICD-10-CM | POA: Diagnosis not present

## 2011-06-15 DIAGNOSIS — R279 Unspecified lack of coordination: Secondary | ICD-10-CM | POA: Diagnosis not present

## 2011-06-15 DIAGNOSIS — M064 Inflammatory polyarthropathy: Secondary | ICD-10-CM | POA: Diagnosis not present

## 2011-06-15 DIAGNOSIS — M6281 Muscle weakness (generalized): Secondary | ICD-10-CM | POA: Diagnosis not present

## 2011-06-16 DIAGNOSIS — R279 Unspecified lack of coordination: Secondary | ICD-10-CM | POA: Diagnosis not present

## 2011-06-16 DIAGNOSIS — M6281 Muscle weakness (generalized): Secondary | ICD-10-CM | POA: Diagnosis not present

## 2011-06-16 DIAGNOSIS — M064 Inflammatory polyarthropathy: Secondary | ICD-10-CM | POA: Diagnosis not present

## 2011-06-18 DIAGNOSIS — M6281 Muscle weakness (generalized): Secondary | ICD-10-CM | POA: Diagnosis not present

## 2011-06-18 DIAGNOSIS — M064 Inflammatory polyarthropathy: Secondary | ICD-10-CM | POA: Diagnosis not present

## 2011-06-18 DIAGNOSIS — R279 Unspecified lack of coordination: Secondary | ICD-10-CM | POA: Diagnosis not present

## 2011-06-21 DIAGNOSIS — R279 Unspecified lack of coordination: Secondary | ICD-10-CM | POA: Diagnosis not present

## 2011-06-21 DIAGNOSIS — M064 Inflammatory polyarthropathy: Secondary | ICD-10-CM | POA: Diagnosis not present

## 2011-06-21 DIAGNOSIS — M6281 Muscle weakness (generalized): Secondary | ICD-10-CM | POA: Diagnosis not present

## 2011-08-30 DIAGNOSIS — I10 Essential (primary) hypertension: Secondary | ICD-10-CM | POA: Diagnosis not present

## 2011-08-30 DIAGNOSIS — D649 Anemia, unspecified: Secondary | ICD-10-CM | POA: Diagnosis not present

## 2011-09-07 DIAGNOSIS — D649 Anemia, unspecified: Secondary | ICD-10-CM | POA: Diagnosis not present

## 2011-09-07 DIAGNOSIS — N401 Enlarged prostate with lower urinary tract symptoms: Secondary | ICD-10-CM | POA: Diagnosis not present

## 2011-09-07 DIAGNOSIS — G4700 Insomnia, unspecified: Secondary | ICD-10-CM | POA: Diagnosis not present

## 2011-09-07 DIAGNOSIS — I10 Essential (primary) hypertension: Secondary | ICD-10-CM | POA: Diagnosis not present

## 2011-09-07 DIAGNOSIS — G20 Parkinson's disease: Secondary | ICD-10-CM | POA: Diagnosis not present

## 2011-10-12 DIAGNOSIS — Z961 Presence of intraocular lens: Secondary | ICD-10-CM | POA: Diagnosis not present

## 2011-10-12 DIAGNOSIS — H16149 Punctate keratitis, unspecified eye: Secondary | ICD-10-CM | POA: Diagnosis not present

## 2011-10-23 DIAGNOSIS — T1490XA Injury, unspecified, initial encounter: Secondary | ICD-10-CM | POA: Diagnosis not present

## 2011-10-23 DIAGNOSIS — S41009A Unspecified open wound of unspecified shoulder, initial encounter: Secondary | ICD-10-CM | POA: Diagnosis not present

## 2011-10-24 ENCOUNTER — Emergency Department (HOSPITAL_COMMUNITY): Payer: Medicare Other

## 2011-10-24 ENCOUNTER — Inpatient Hospital Stay (HOSPITAL_COMMUNITY)
Admission: EM | Admit: 2011-10-24 | Discharge: 2011-10-28 | DRG: 906 | Disposition: A | Discharge status: Skilled Nursing Facility | Payer: Medicare Other | Attending: Family Medicine | Admitting: Family Medicine

## 2011-10-24 ENCOUNTER — Inpatient Hospital Stay (HOSPITAL_COMMUNITY): Payer: Medicare Other

## 2011-10-24 ENCOUNTER — Encounter (HOSPITAL_COMMUNITY): Payer: Self-pay | Admitting: Emergency Medicine

## 2011-10-24 DIAGNOSIS — IMO0002 Reserved for concepts with insufficient information to code with codable children: Secondary | ICD-10-CM | POA: Diagnosis not present

## 2011-10-24 DIAGNOSIS — R29818 Other symptoms and signs involving the nervous system: Secondary | ICD-10-CM | POA: Diagnosis not present

## 2011-10-24 DIAGNOSIS — K5730 Diverticulosis of large intestine without perforation or abscess without bleeding: Secondary | ICD-10-CM | POA: Diagnosis present

## 2011-10-24 DIAGNOSIS — R945 Abnormal results of liver function studies: Secondary | ICD-10-CM

## 2011-10-24 DIAGNOSIS — K219 Gastro-esophageal reflux disease without esophagitis: Secondary | ICD-10-CM | POA: Diagnosis present

## 2011-10-24 DIAGNOSIS — N200 Calculus of kidney: Secondary | ICD-10-CM | POA: Diagnosis present

## 2011-10-24 DIAGNOSIS — G459 Transient cerebral ischemic attack, unspecified: Secondary | ICD-10-CM | POA: Diagnosis not present

## 2011-10-24 DIAGNOSIS — K2100 Gastro-esophageal reflux disease with esophagitis, without bleeding: Secondary | ICD-10-CM | POA: Diagnosis not present

## 2011-10-24 DIAGNOSIS — K922 Gastrointestinal hemorrhage, unspecified: Secondary | ICD-10-CM

## 2011-10-24 DIAGNOSIS — M6281 Muscle weakness (generalized): Secondary | ICD-10-CM | POA: Diagnosis not present

## 2011-10-24 DIAGNOSIS — D509 Iron deficiency anemia, unspecified: Secondary | ICD-10-CM | POA: Diagnosis not present

## 2011-10-24 DIAGNOSIS — S6990XA Unspecified injury of unspecified wrist, hand and finger(s), initial encounter: Secondary | ICD-10-CM | POA: Diagnosis not present

## 2011-10-24 DIAGNOSIS — M549 Dorsalgia, unspecified: Secondary | ICD-10-CM

## 2011-10-24 DIAGNOSIS — R748 Abnormal levels of other serum enzymes: Secondary | ICD-10-CM

## 2011-10-24 DIAGNOSIS — E871 Hypo-osmolality and hyponatremia: Secondary | ICD-10-CM | POA: Diagnosis present

## 2011-10-24 DIAGNOSIS — S0990XA Unspecified injury of head, initial encounter: Secondary | ICD-10-CM | POA: Diagnosis not present

## 2011-10-24 DIAGNOSIS — I10 Essential (primary) hypertension: Secondary | ICD-10-CM | POA: Diagnosis present

## 2011-10-24 DIAGNOSIS — M6282 Rhabdomyolysis: Secondary | ICD-10-CM

## 2011-10-24 DIAGNOSIS — S61409A Unspecified open wound of unspecified hand, initial encounter: Secondary | ICD-10-CM | POA: Diagnosis not present

## 2011-10-24 DIAGNOSIS — I639 Cerebral infarction, unspecified: Secondary | ICD-10-CM

## 2011-10-24 DIAGNOSIS — N189 Chronic kidney disease, unspecified: Secondary | ICD-10-CM | POA: Diagnosis not present

## 2011-10-24 DIAGNOSIS — F411 Generalized anxiety disorder: Secondary | ICD-10-CM | POA: Diagnosis present

## 2011-10-24 DIAGNOSIS — R7989 Other specified abnormal findings of blood chemistry: Secondary | ICD-10-CM | POA: Diagnosis not present

## 2011-10-24 DIAGNOSIS — R1312 Dysphagia, oropharyngeal phase: Secondary | ICD-10-CM | POA: Diagnosis not present

## 2011-10-24 DIAGNOSIS — I4949 Other premature depolarization: Secondary | ICD-10-CM | POA: Diagnosis present

## 2011-10-24 DIAGNOSIS — S1093XA Contusion of unspecified part of neck, initial encounter: Secondary | ICD-10-CM | POA: Diagnosis not present

## 2011-10-24 DIAGNOSIS — R918 Other nonspecific abnormal finding of lung field: Secondary | ICD-10-CM | POA: Diagnosis not present

## 2011-10-24 DIAGNOSIS — K921 Melena: Secondary | ICD-10-CM | POA: Diagnosis not present

## 2011-10-24 DIAGNOSIS — Z9181 History of falling: Secondary | ICD-10-CM | POA: Diagnosis not present

## 2011-10-24 DIAGNOSIS — R4182 Altered mental status, unspecified: Secondary | ICD-10-CM | POA: Diagnosis not present

## 2011-10-24 DIAGNOSIS — S0003XA Contusion of scalp, initial encounter: Secondary | ICD-10-CM | POA: Diagnosis not present

## 2011-10-24 DIAGNOSIS — R269 Unspecified abnormalities of gait and mobility: Secondary | ICD-10-CM | POA: Diagnosis present

## 2011-10-24 DIAGNOSIS — N289 Disorder of kidney and ureter, unspecified: Secondary | ICD-10-CM

## 2011-10-24 DIAGNOSIS — W19XXXA Unspecified fall, initial encounter: Secondary | ICD-10-CM | POA: Diagnosis present

## 2011-10-24 DIAGNOSIS — Z8673 Personal history of transient ischemic attack (TIA), and cerebral infarction without residual deficits: Secondary | ICD-10-CM

## 2011-10-24 DIAGNOSIS — D649 Anemia, unspecified: Secondary | ICD-10-CM | POA: Diagnosis not present

## 2011-10-24 DIAGNOSIS — N179 Acute kidney failure, unspecified: Secondary | ICD-10-CM | POA: Diagnosis present

## 2011-10-24 DIAGNOSIS — G20A1 Parkinson's disease without dyskinesia, without mention of fluctuations: Secondary | ICD-10-CM | POA: Diagnosis present

## 2011-10-24 DIAGNOSIS — K5790 Diverticulosis of intestine, part unspecified, without perforation or abscess without bleeding: Secondary | ICD-10-CM

## 2011-10-24 DIAGNOSIS — E559 Vitamin D deficiency, unspecified: Secondary | ICD-10-CM | POA: Diagnosis present

## 2011-10-24 DIAGNOSIS — R471 Dysarthria and anarthria: Secondary | ICD-10-CM | POA: Diagnosis not present

## 2011-10-24 DIAGNOSIS — I679 Cerebrovascular disease, unspecified: Secondary | ICD-10-CM | POA: Diagnosis not present

## 2011-10-24 DIAGNOSIS — G20 Parkinson's disease: Secondary | ICD-10-CM | POA: Diagnosis not present

## 2011-10-24 DIAGNOSIS — R195 Other fecal abnormalities: Secondary | ICD-10-CM | POA: Diagnosis not present

## 2011-10-24 DIAGNOSIS — M545 Low back pain, unspecified: Secondary | ICD-10-CM | POA: Diagnosis not present

## 2011-10-24 DIAGNOSIS — I6350 Cerebral infarction due to unspecified occlusion or stenosis of unspecified cerebral artery: Secondary | ICD-10-CM | POA: Diagnosis not present

## 2011-10-24 HISTORY — DX: Cerebral infarction, unspecified: I63.9

## 2011-10-24 HISTORY — DX: Personal history of other diseases of the digestive system: Z87.19

## 2011-10-24 HISTORY — DX: Chronic kidney disease, unspecified: N18.9

## 2011-10-24 LAB — CBC WITH DIFFERENTIAL/PLATELET
Eosinophils Absolute: 0.2 10*3/uL (ref 0.0–0.7)
Eosinophils Relative: 3 % (ref 0–5)
Lymphs Abs: 2 10*3/uL (ref 0.7–4.0)
MCH: 29.7 pg (ref 26.0–34.0)
MCV: 86.7 fL (ref 78.0–100.0)
Platelets: 242 10*3/uL (ref 150–400)
RDW: 16.3 % — ABNORMAL HIGH (ref 11.5–15.5)

## 2011-10-24 LAB — CBC
HCT: 28.6 % — ABNORMAL LOW (ref 39.0–52.0)
Hemoglobin: 9 g/dL — ABNORMAL LOW (ref 13.0–17.0)
Hemoglobin: 9.7 g/dL — ABNORMAL LOW (ref 13.0–17.0)
MCHC: 35.1 g/dL (ref 30.0–36.0)
MCV: 86.9 fL (ref 78.0–100.0)
RBC: 3.03 MIL/uL — ABNORMAL LOW (ref 4.22–5.81)
RBC: 3.29 MIL/uL — ABNORMAL LOW (ref 4.22–5.81)
RBC: 3.22 MIL/uL — ABNORMAL LOW (ref 4.22–5.81)
WBC: 7 10*3/uL (ref 4.0–10.5)
WBC: 5.8 10*3/uL (ref 4.0–10.5)
WBC: 6.9 10*3/uL (ref 4.0–10.5)

## 2011-10-24 LAB — PROTIME-INR
INR: 1.25 (ref 0.00–1.49)
Prothrombin Time: 16 seconds — ABNORMAL HIGH (ref 11.6–15.2)

## 2011-10-24 LAB — COMPREHENSIVE METABOLIC PANEL
ALT: 6 U/L (ref 0–53)
AST: 15 U/L (ref 0–37)
CO2: 17 mEq/L — ABNORMAL LOW (ref 19–32)
Calcium: 8.8 mg/dL (ref 8.4–10.5)
Calcium: 8.9 mg/dL (ref 8.4–10.5)
Creatinine, Ser: 2.26 mg/dL — ABNORMAL HIGH (ref 0.50–1.35)
Creatinine, Ser: 1.95 mg/dL — ABNORMAL HIGH (ref 0.50–1.35)
GFR calc Af Amer: 29 mL/min — ABNORMAL LOW (ref >90)
GFR calc Af Amer: 34 mL/min — ABNORMAL LOW (ref >90)
GFR calc non Af Amer: 30 mL/min — ABNORMAL LOW (ref >90)
Glucose, Bld: 96 mg/dL (ref 70–99)
Glucose, Bld: 117 mg/dL — ABNORMAL HIGH (ref 70–99)
Sodium: 126 mEq/L — ABNORMAL LOW (ref 135–145)
Total Protein: 5.8 g/dL — ABNORMAL LOW (ref 6.0–8.3)

## 2011-10-24 LAB — URINALYSIS, ROUTINE W REFLEX MICROSCOPIC
Bilirubin Urine: NEGATIVE
Leukocytes, UA: NEGATIVE
Nitrite: NEGATIVE
Specific Gravity, Urine: 1.013 (ref 1.005–1.030)
pH: 7 (ref 5.0–8.0)

## 2011-10-24 LAB — CREATININE, URINE, RANDOM: Creatinine, Urine: 35.93 mg/dL

## 2011-10-24 LAB — APTT: aPTT: 34 seconds (ref 24–37)

## 2011-10-24 LAB — OSMOLALITY, URINE: Osmolality, Ur: 265 mOsm/kg — ABNORMAL LOW (ref 390–1090)

## 2011-10-24 LAB — MRSA PCR SCREENING: MRSA by PCR: POSITIVE — AB

## 2011-10-24 LAB — PREPARE RBC (CROSSMATCH)

## 2011-10-24 LAB — OCCULT BLOOD, POC DEVICE: Fecal Occult Bld: POSITIVE

## 2011-10-24 LAB — SODIUM, URINE, RANDOM: Sodium, Ur: 52 mEq/L

## 2011-10-24 LAB — SODIUM: Sodium: 130 mEq/L — ABNORMAL LOW (ref 135–145)

## 2011-10-24 MED ORDER — DIPHENHYDRAMINE HCL 25 MG PO CAPS
25.0000 mg | ORAL_CAPSULE | Freq: Once | ORAL | Status: AC
  Administered 2011-10-24: 25 mg via ORAL
  Filled 2011-10-24: qty 1

## 2011-10-24 MED ORDER — MORPHINE SULFATE 2 MG/ML IJ SOLN: 2.0000 mg | INTRAMUSCULAR | Status: DC | PRN

## 2011-10-24 MED ORDER — SODIUM CHLORIDE 0.9 % IJ SOLN
3.0000 mL | Freq: Two times a day (BID) | INTRAMUSCULAR | Status: DC
  Administered 2011-10-25 – 2011-10-28 (×2): 3 mL via INTRAVENOUS

## 2011-10-24 MED ORDER — SODIUM CHLORIDE 0.9 % IV SOLN: 500.0000 mL | Freq: Once | INTRAVENOUS | Status: DC

## 2011-10-24 MED ORDER — SENNOSIDES 8.6 MG PO TABS: 1.0000 | ORAL_TABLET | Freq: Two times a day (BID) | ORAL | Status: DC

## 2011-10-24 MED ORDER — FINASTERIDE 5 MG PO TABS
5.0000 mg | ORAL_TABLET | Freq: Every day | ORAL | Status: DC
  Administered 2011-10-24 – 2011-10-28 (×5): 5 mg via ORAL
  Filled 2011-10-24 (×5): qty 1

## 2011-10-24 MED ORDER — PANTOPRAZOLE SODIUM 40 MG IV SOLR
8.0000 mg/h | INTRAVENOUS | Status: DC
  Administered 2011-10-24: 8 mg/h via INTRAVENOUS
  Filled 2011-10-24 (×2): qty 80

## 2011-10-24 MED ORDER — CYCLOSPORINE 0.05 % OP EMUL
1.0000 [drp] | Freq: Two times a day (BID) | OPHTHALMIC | Status: DC
  Administered 2011-10-24 – 2011-10-28 (×9): 1 [drp] via OPHTHALMIC
  Filled 2011-10-24 (×10): qty 1

## 2011-10-24 MED ORDER — ACETAMINOPHEN 325 MG PO TABS
650.0000 mg | ORAL_TABLET | Freq: Once | ORAL | Status: AC
  Administered 2011-10-24: 650 mg via ORAL
  Filled 2011-10-24: qty 2

## 2011-10-24 MED ORDER — CHLORHEXIDINE GLUCONATE CLOTH 2 % EX PADS
6.0000 | MEDICATED_PAD | Freq: Every day | CUTANEOUS | Status: DC
  Administered 2011-10-25 – 2011-10-28 (×4): 6 via TOPICAL

## 2011-10-24 MED ORDER — DOCUSATE SODIUM 100 MG PO CAPS
100.0000 mg | ORAL_CAPSULE | Freq: Two times a day (BID) | ORAL | Status: DC
  Administered 2011-10-24 – 2011-10-28 (×8): 100 mg via ORAL
  Filled 2011-10-24 (×9): qty 1

## 2011-10-24 MED ORDER — ACETAMINOPHEN 500 MG PO TABS
500.0000 mg | ORAL_TABLET | Freq: Every day | ORAL | Status: DC
  Administered 2011-10-24 – 2011-10-27 (×4): 500 mg via ORAL
  Filled 2011-10-24 (×5): qty 1

## 2011-10-24 MED ORDER — CEFAZOLIN SODIUM 1-5 GM-% IV SOLN
1.0000 g | Freq: Three times a day (TID) | INTRAVENOUS | Status: DC
  Administered 2011-10-24 – 2011-10-25 (×3): 1 g via INTRAVENOUS
  Filled 2011-10-24 (×5): qty 50

## 2011-10-24 MED ORDER — MUPIROCIN 2 % EX OINT
1.0000 "application " | TOPICAL_OINTMENT | Freq: Two times a day (BID) | CUTANEOUS | Status: DC
  Administered 2011-10-24 – 2011-10-28 (×8): 1 via NASAL
  Filled 2011-10-24: qty 22

## 2011-10-24 MED ORDER — OXYCODONE HCL 5 MG PO TABS: 5.0000 mg | ORAL_TABLET | ORAL | Status: DC | PRN

## 2011-10-24 MED ORDER — SODIUM CHLORIDE 0.9 % IV BOLUS (SEPSIS)
500.0000 mL | Freq: Once | INTRAVENOUS | Status: AC
  Administered 2011-10-24: 500 mL via INTRAVENOUS

## 2011-10-24 MED ORDER — OXYCODONE HCL 5 MG PO CAPS: 5.0000 mg | ORAL_CAPSULE | ORAL | Status: DC | PRN

## 2011-10-24 MED ORDER — GATIFLOXACIN 0.5 % OP SOLN
1.0000 [drp] | Freq: Three times a day (TID) | OPHTHALMIC | Status: DC
  Administered 2011-10-24 – 2011-10-28 (×14): 1 [drp] via OPHTHALMIC
  Filled 2011-10-24: qty 2.5

## 2011-10-24 MED ORDER — SODIUM CHLORIDE 0.9 % IV SOLN
INTRAVENOUS | Status: DC
  Administered 2011-10-24: 16:00:00 via INTRAVENOUS

## 2011-10-24 MED ORDER — BESIFLOXACIN HCL 0.6 % OP SUSP: 1.0000 [drp] | Freq: Three times a day (TID) | OPHTHALMIC | Status: DC

## 2011-10-24 MED ORDER — GUAIFENESIN-DM 100-10 MG/5ML PO SYRP: 5.0000 mL | ORAL_SOLUTION | ORAL | Status: DC | PRN

## 2011-10-24 MED ORDER — ACETAMINOPHEN 650 MG RE SUPP: 650.0000 mg | Freq: Four times a day (QID) | RECTAL | Status: DC | PRN

## 2011-10-24 MED ORDER — ZOLPIDEM TARTRATE 5 MG PO TABS
5.0000 mg | ORAL_TABLET | Freq: Every evening | ORAL | Status: DC | PRN
  Administered 2011-10-26: 5 mg via ORAL
  Filled 2011-10-24: qty 1

## 2011-10-24 MED ORDER — TEMAZEPAM 15 MG PO CAPS: 30.0000 mg | ORAL_CAPSULE | Freq: Every day | ORAL | Status: DC

## 2011-10-24 MED ORDER — ALBUTEROL SULFATE (5 MG/ML) 0.5% IN NEBU
2.5000 mg | INHALATION_SOLUTION | RESPIRATORY_TRACT | Status: DC | PRN
  Administered 2011-10-27: 2.5 mg via RESPIRATORY_TRACT
  Filled 2011-10-24: qty 0.5

## 2011-10-24 MED ORDER — SODIUM CHLORIDE 0.9 % IV SOLN
80.0000 mg | Freq: Once | INTRAVENOUS | Status: AC
  Administered 2011-10-24: 80 mg via INTRAVENOUS
  Filled 2011-10-24: qty 80

## 2011-10-24 MED ORDER — ACETAMINOPHEN 325 MG PO TABS: 650.0000 mg | ORAL_TABLET | Freq: Four times a day (QID) | ORAL | Status: DC | PRN

## 2011-10-24 MED ORDER — POLYVINYL ALCOHOL 1.4 % OP SOLN
1.0000 [drp] | Freq: Four times a day (QID) | OPHTHALMIC | Status: DC
  Administered 2011-10-24 – 2011-10-28 (×17): 1 [drp] via OPHTHALMIC
  Filled 2011-10-24: qty 15

## 2011-10-24 MED ORDER — PANTOPRAZOLE SODIUM 40 MG IV SOLR
40.0000 mg | Freq: Two times a day (BID) | INTRAVENOUS | Status: DC
  Administered 2011-10-24 – 2011-10-28 (×8): 40 mg via INTRAVENOUS
  Filled 2011-10-24 (×11): qty 40

## 2011-10-24 MED ORDER — CARBOXYMETHYLCELLULOSE SODIUM 1 % OP SOLN: 1.0000 [drp] | Freq: Four times a day (QID) | OPHTHALMIC | Status: DC

## 2011-10-24 MED ORDER — ONDANSETRON HCL 4 MG/2ML IJ SOLN: 4.0000 mg | Freq: Four times a day (QID) | INTRAMUSCULAR | Status: DC | PRN

## 2011-10-24 MED ORDER — SODIUM CHLORIDE 0.9 % IV SOLN: INTRAVENOUS | Status: AC

## 2011-10-24 MED ORDER — CEFAZOLIN SODIUM 1-5 GM-% IV SOLN
1.0000 g | Freq: Once | INTRAVENOUS | Status: AC
  Administered 2011-10-24: 1 g via INTRAVENOUS
  Filled 2011-10-24 (×2): qty 50

## 2011-10-24 MED ORDER — SENNA 8.6 MG PO TABS
1.0000 | ORAL_TABLET | Freq: Two times a day (BID) | ORAL | Status: DC
  Administered 2011-10-24 – 2011-10-28 (×8): 8.6 mg via ORAL
  Filled 2011-10-24 (×10): qty 1

## 2011-10-24 MED ORDER — TETANUS-DIPHTH-ACELL PERTUSSIS 5-2.5-18.5 LF-MCG/0.5 IM SUSP
0.5000 mL | Freq: Once | INTRAMUSCULAR | Status: AC
  Administered 2011-10-24: 0.5 mL via INTRAMUSCULAR
  Filled 2011-10-24: qty 0.5

## 2011-10-24 MED ORDER — TAMSULOSIN HCL 0.4 MG PO CAPS
0.4000 mg | ORAL_CAPSULE | Freq: Every morning | ORAL | Status: DC
  Administered 2011-10-24 – 2011-10-28 (×4): 0.4 mg via ORAL
  Filled 2011-10-24 (×5): qty 1

## 2011-10-24 MED ORDER — LISINOPRIL 20 MG PO TABS
20.0000 mg | ORAL_TABLET | Freq: Every morning | ORAL | Status: DC
  Administered 2011-10-26 – 2011-10-28 (×3): 20 mg via ORAL
  Filled 2011-10-24 (×5): qty 1

## 2011-10-24 MED ORDER — ONDANSETRON HCL 4 MG PO TABS: 4.0000 mg | ORAL_TABLET | Freq: Four times a day (QID) | ORAL | Status: DC | PRN

## 2011-10-24 NOTE — Progress Notes (Signed)
Patient seen and evaluated earlier this am by my associate.  Agree with evaluation and plan as outlined in Dr. Celene Kras note.  I have contacted [facility] for further evaluation and they have indicated that they will evaluate patient further.  Tabatha Razzano, [employer]

## 2011-10-24 NOTE — Consult Note (Signed)
NAMEMarland Kitchen  Vincent Potts, Vincent Potts NO.:  [id number]  MEDICAL RECORD NO.:  [id number]  LOCATION:  6704                         FACILITY:  [facility]  PHYSICIAN:  Dionne Ano. Teah Votaw, M.D.DATE OF BIRTH:  [birth year]  DATE OF CONSULTATION: DATE OF DISCHARGE:                                CONSULTATION   HISTORY OF PRESENT ILLNESS:  I had the pleasure to see Vincent Potts for consultation today. This patient was seen for consult in regard to his upper extremity predicament.  He has been seen by the Internal Medicine Service.  I have been asked to see him in regards to his left small finger amputation.  He has a bloody/actively bleeding small finger, left hand.  He does not know how this happened.  It appears he lost his balance and fell on the bathroom, he sustained a trauma to the left 5th digit, which I have been asked to take care.  He has a history of anemia, heme-positive stools, constipation, and notes no abdominal pain, chest pain, or shortness of breath today.  I have reviewed his findings at length.  REVIEW OF SYSTEMS:  Includes fall and constipation.  PAST MEDICAL HISTORY:  Reviewed in his chart including thyroid disease, anemia, vitamin D deficiency, and multiple other problems including hyponatremia, anxiety, parkinsonism, hearing loss, hypertension, PVC, cerebral infarction, gastroesophageal reflux disease, diverticulosis, sciatica, back pain, gait disorder, cataract, edema, keratosis, cystic disease to the liver, paresthesias, and inguinal hernia.  PAST SURGICAL HISTORY:  Hernia repair, appendectomy, and prostate surgery.  MEDICATIONS:  Medicines were reviewed in his chart of course and identified.  ALLERGIES:  OLANZAPINE AND RIVASTIGMINE.  SOCIAL HISTORY:  He ambulates with a walker.  [facility] [place].  He does not smoke or use illicit drugs.  FAMILY HISTORY:  Significant for hypertension.  PHYSICAL EXAMINATION:   GENERAL AND VITAL SIGNS:  He is a pleasant 78- year-old male.  O2 sats are normal, respirations 16, pulse was 64.  He is in no acute distress.  He is alert and oriented. HEENT:  Intact dentition. NECK AND BACK:  Nontender. SHOULDER, ELBOW, AND WRIST:  Without abnormality. ABDOMEN:  Nontender nondistended. LUNGS:  He has equal breath sounds and lungs are clear to auscultation. EXTREMITIES:  He moves all 4 extremities.  He does occasionally have a cramp in his right leg he states.  A small finger has an amputation over the distal phalanx region with exposed bone and disarray of the soft tissues.  I have reviewed this at length.  LABORATORY ANALYSIS:  Significant for low potassium at 126, creatinine is 2.26.  Albumin is 2.7.  His white blood cell count is 7.6.  The patient's UA is stable.  X-rays are reviewed including CT of the head and chest.  His left hand shows traumatic amputation of the 5th distal phalanx.  I have reviewed this at length and his findings.  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  PLAN:  I  have discussed with him his findings.  We have consented him for revision amputation of the small finger amputation, left hand.     Dionne Ano. Amanda Pea, M.D.     [facility]  D:  10/24/2011  T:  10/24/2011  Job:  [id number]

## 2011-10-24 NOTE — ED Provider Notes (Signed)
History     CSN: [id number]  Arrival date & time 10/24/11  0016   First MD Initiated Contact with Patient 10/24/11 [phone]      Chief Complaint  Patient presents with  . Fall    (Consider location/radiation/quality/duration/timing/severity/associated sxs/prior treatment) HPI Pt states that he got up to go to the bathroom tonight and fell. He does not know the mechanism. No def syncope. Pt complains L 5 th digit pain and partial amputation. Pt also has hematoma to scalp. Denies neck pain, chest pain, sob, abd pain, melena, dizziness.  Past Medical History  Diagnosis Date  . Thyroid disease   . Anemia   . Vitamin D deficiency disease   . Hyponatremia   . Anxiety   . Parkinsonism   . Hearing loss   . Hypertension   . PVC (premature ventricular contraction)   . CVA (cerebral infarction)   . GERD (gastroesophageal reflux disease)   . Diverticulosis   . Sciatica   . Back pain   . Gait disorder   . Cataract   . Edema   . Keratosis   . Sciatica   . Cystic disease of liver   . Paresthesia   . Inguinal hernia   . Hearing loss   . Stroke 2010    no deficits  . Chronic kidney disease 2013    rhabdo  . H/O hiatal hernia 2008    repaired    Past Surgical History  Procedure Date  . Prostate surgery     cancer  . Hernia repair   . Appendectomy   . Esophagogastroduodenoscopy 10/25/2011    Procedure: ESOPHAGOGASTRODUODENOSCOPY (EGD);  Surgeon: Vertell Novak., MD;  [facility] END[facility]COPY;  Service: Endoscopy;  Laterality: N/A;    Family History  Problem Relation Age of Onset  . Hypertension Mother     History  Substance Use Topics  . Smoking status: Never Smoker   . Smokeless tobacco: Not on file  . Alcohol Use: Yes     occasionaly      Review of Systems  Constitutional: Negative for fever, chills and fatigue.  HENT: Positive for neck stiffness. Negative for neck pain.   Eyes: Negative for visual disturbance.  Respiratory: Negative for shortness of breath.    Cardiovascular: Negative for chest pain, palpitations and leg swelling.  Gastrointestinal: Negative for nausea, vomiting, abdominal pain, diarrhea and blood in stool.  Genitourinary: Negative for dysuria and flank pain.  Musculoskeletal: Negative for back pain.  Skin: Negative for rash and wound.  Neurological: Negative for dizziness, weakness, light-headedness, numbness and headaches.    Allergies  Olanzapine and Rivastigmine  Home Medications   No current outpatient prescriptions on file.  BP 179/95  Pulse 70  Temp 98.1 F (36.7 C) (Oral)  Resp 18  Ht 5\' 7"  (1.702 m)  Wt 133 lb 3.2 oz (60.419 kg)  BMI 20.86 kg/m2  SpO2 95%  Physical Exam  ED Course  Procedures (including critical care time)  Labs Reviewed  CBC WITH DIFFERENTIAL - Abnormal; Notable for the following:    RBC 2.56 (*)     Hemoglobin 7.6 (*)     HCT 22.2 (*)     RDW 16.3 (*)     All other components within normal limits  COMPREHENSIVE METABOLIC PANEL - Abnormal; Notable for the following:    Sodium 126 (*)     Chloride 94 (*)     BUN 33 (*)     Creatinine, Ser 2.26 (*)  Total Protein 5.8 (*)     Albumin 2.7 (*)     Total Bilirubin 0.1 (*)     GFR calc non Af Amer 25 (*)     GFR calc Af Amer 29 (*)     All other components within normal limits  PROTIME-INR - Abnormal; Notable for the following:    Prothrombin Time 16.0 (*)     All other components within normal limits  COMPREHENSIVE METABOLIC PANEL - Abnormal; Notable for the following:    Sodium 129 (*)     CO2 17 (*)     Glucose, Bld 117 (*)     BUN 30 (*)     Creatinine, Ser 1.95 (*)     Albumin 2.8 (*)     Total Bilirubin 0.1 (*)     GFR calc non Af Amer 30 (*)     GFR calc Af Amer 34 (*)     All other components within normal limits  CBC - Abnormal; Notable for the following:    RBC 3.03 (*)     Hemoglobin 9.0 (*)     HCT 26.6 (*)     RDW 16.8 (*)     All other components within normal limits  CBC - Abnormal; Notable for the  following:    RBC 3.29 (*)     Hemoglobin 9.8 (*)     HCT 28.6 (*)     RDW 16.0 (*)     All other components within normal limits  CBC - Abnormal; Notable for the following:    RBC 3.22 (*)     Hemoglobin 9.7 (*)     HCT 27.6 (*)     RDW 15.9 (*)     All other components within normal limits  MRSA PCR SCREENING - Abnormal; Notable for the following:    MRSA by PCR POSITIVE (*)     All other components within normal limits  OSMOLALITY, URINE - Abnormal; Notable for the following:    Osmolality, Ur 265 (*)     All other components within normal limits  SODIUM - Abnormal; Notable for the following:    Sodium 130 (*)     All other components within normal limits  SODIUM - Abnormal; Notable for the following:    Sodium 128 (*)     All other components within normal limits  CBC - Abnormal; Notable for the following:    RBC 3.22 (*)     Hemoglobin 9.6 (*)     HCT 27.6 (*)     RDW 15.9 (*)     All other components within normal limits  SODIUM - Abnormal; Notable for the following:    Sodium 131 (*)     All other components within normal limits  BASIC METABOLIC PANEL - Abnormal; Notable for the following:    Sodium 128 (*)     BUN 28 (*)     Creatinine, Ser 2.07 (*)     GFR calc non Af Amer 28 (*)     GFR calc Af Amer 32 (*)     All other components within normal limits  SODIUM - Abnormal; Notable for the following:    Sodium 133 (*)     All other components within normal limits  SODIUM - Abnormal; Notable for the following:    Sodium 131 (*)     All other components within normal limits  SODIUM - Abnormal; Notable for the following:    Sodium 131 (*)  All other components within normal limits  BASIC METABOLIC PANEL - Abnormal; Notable for the following:    Sodium 132 (*)     CO2 18 (*)     BUN 26 (*)     Creatinine, Ser 2.04 (*)     GFR calc non Af Amer 28 (*)     GFR calc Af Amer 33 (*)     All other components within normal limits  BASIC METABOLIC PANEL -  Abnormal; Notable for the following:    Sodium 134 (*)     Creatinine, Ser 1.88 (*)     GFR calc non Af Amer 31 (*)     GFR calc Af Amer 36 (*)     All other components within normal limits  CBC - Abnormal; Notable for the following:    RBC 3.36 (*)     Hemoglobin 10.2 (*)     HCT 29.5 (*)     RDW 16.3 (*)     All other components within normal limits  SODIUM - Abnormal; Notable for the following:    Sodium 131 (*)     All other components within normal limits  SODIUM - Abnormal; Notable for the following:    Sodium 128 (*)     All other components within normal limits  SODIUM - Abnormal; Notable for the following:    Sodium 126 (*)     All other components within normal limits  URINALYSIS, ROUTINE W REFLEX MICROSCOPIC  APTT  POCT I-STAT TROPONIN I  PREPARE RBC (CROSSMATCH)  OCCULT BLOOD, POC DEVICE  TYPE AND SCREEN  ABO/RH  TSH  CREATININE, URINE, RANDOM  SODIUM, URINE, RANDOM  MAGNESIUM  PHOSPHORUS  OCCULT BLOOD X 1 CARD TO LAB, STOOL  SODIUM, URINE, RANDOM  CREATININE, URINE, RANDOM  OSMOLALITY, URINE  BASIC METABOLIC PANEL  SODIUM  SODIUM  SODIUM   No results found.   1. Fall   2. Closed head injury   3. Finger injury   4. Anemia   5. GI bleed   6. Hyponatremia   7. Renal insufficiency   8. Acute renal failure   9. Altered mental status   10. Hypertension   11. GERD (gastroesophageal reflux disease)   12. Nephrolithiasis       MDM  Discussed with Dr Amanda Pea. Will see in the ED.  [facility] will see and admit pt.       Loren Racer, MD 10/27/11 [phone]

## 2011-10-24 NOTE — Progress Notes (Signed)
Received patient from ED,admitted due to fall.Patient is from [facility].Patient is alert and oriented but very hard of hearing,hearing aid on left ear.Placed on telemetry.Patient has multiple bruising on both upper extremities and both lower extremities.Both lower extremities has a +3 edema,left leg is weeping around bruised area.Patient also has an abrasion and bruising on top of head.Left 5th finger has a dressing,per report from ED RN,patient had a partial amputation of said finger while in the ED.Patient oriented to room.Callbell within reach,bed alarm turned on.Explained to patient that he will get two units of blood.Consent for blood transfusion signed. Ayala Ribble Joselita,RN

## 2011-10-24 NOTE — Op Note (Signed)
NAMEMarland Potts  JARRETTE, DEHNER NO.:  [id number]  MEDICAL RECORD NO.:  [id number]  LOCATION:  6704                         FACILITY:  [facility]  PHYSICIAN:  Dionne Ano. Jaece Ducharme, M.D.DATE OF BIRTH:  Oct 29, 1924  DATE OF PROCEDURE: DATE OF DISCHARGE:                              OPERATIVE REPORT   PREOPERATIVE DIAGNOSIS:  Amputation, left small finger.  POSTOPERATIVE DIAGNOSIS:  Amputation, left small finger.  PROCEDURE:  Revision amputation, small finger, left hand with bilateral neurectomies.  SURGEON:  Dionne Ano. Amanda Pea, M.D.  ASSISTANT:  None.  COMPLICATIONS:  None.  DESCRIPTION OF PROCEDURE:  The patient was brought to the procedure suite. He underwent intermetacarpal block.  Following this, he underwent 2 separate Betadine scrub and paints performed by myself.  Following this, sterile field was secured.  Once this was performed, I placed a Penrose drain around the finger and performed a revision amputation. The patient underwent revision amputation to the small finger.  There were no complications.  I carefully incised the medial and lateral edges.  Skin flaps were elevated and following this, I then dissected down perform bilateral neurectomies and removal of the FDP and allowed to retract proximally. Following this, the distal phalanx was removed at the DIP joint, there was a large amount of arthrosis.  I irrigated copiously, greater than 2 L of saline and following this, deflated the tourniquet and secured hemostasis with cautery.  Following this, the skin edges were sculpted and the patient was repaired with combination 5-0 chromic and 5-0 Prolene suture.  He tolerated this quite well. He had excellent refill. After the Penrose drain was removed, and he was dressed with Adaptic, Xeroform gauze, finger splint, and a Coban wrap.  I will be following him during his admission and making sure the things go smoothly for him. Should any problems arise, he will of  course notify me and otherwise, we will look forward to seeing him during his hospitalization that have been discussed.  All questions have been encouraged and answered.     Dionne Ano. Amanda Pea, M.D.     [facility]  D:  10/24/2011  T:  10/24/2011  Job:  [id number]

## 2011-10-24 NOTE — H&P (Signed)
PCP:   Kimber Relic, MD    Chief Complaint:   fall  HPI: Vincent Potts is a 78 y.o. male   has a past medical history of Thyroid disease; Anemia; Vitamin D deficiency disease; Hyponatremia; Anxiety; Parkinsonism; Hearing loss; Hypertension; PVC (premature ventricular contraction); CVA (cerebral infarction); GERD (gastroesophageal reflux disease); Diverticulosis; Sciatica; Back pain; Gait disorder; Cataract; Edema; Keratosis; Sciatica; Cystic disease of liver; Paresthesia; Inguinal hernia; and Hearing loss.   Presented with  Fall in the bathroom after he lost his balance. 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  In ER he was found to be anemic worse then usual. He was also noted to have hemoccult positive stools. He states he had some constipation and today had a large bowel movement. There may have been some small amount of blood in his stool but he's not sure. Denies any abdominal pain no lightheadedness no chest pain or shortness of breath.  Review of Systems:    Pertinent positives include: fall, constipation.   Constitutional:  No weight loss, night sweats, Fevers, chills, fatigue, weight loss  HEENT:  No headaches, Difficulty swallowing,Tooth/dental problems,Sore throat,  No sneezing, itching, ear ache, nasal congestion, post nasal drip,  Cardio-vascular:  No chest pain, Orthopnea, PND, anasarca, dizziness, palpitations.no Bilateral lower extremity swelling  GI:  No heartburn, indigestion, abdominal pain, nausea, vomiting, diarrhea, change in bowel habits, loss of appetite, melena, blood in stool, hematemesis Resp:  no shortness of breath at rest. No dyspnea on exertion, No excess mucus, no productive cough, No non-productive cough, No coughing up of blood.No change in color of mucus.No wheezing. Skin:  no rash or lesions. No jaundice GU:  no dysuria, change in color of urine, no urgency or frequency. No straining to urinate.  No flank pain.    Musculoskeletal:  No joint pain or no joint swelling. No decreased range of motion. No back pain.  Psych:  No change in mood or affect. No depression or anxiety. No memory loss.  Neuro: no localizing neurological complaints, no tingling, no weakness, no double vision, no gait abnormality, no slurred speech, no confusion  Otherwise ROS are negative except for above, 10 systems were reviewed  Past Medical History: Past Medical History  Diagnosis Date  . Thyroid disease   . Anemia   . Vitamin D deficiency disease   . Hyponatremia   . Anxiety   . Parkinsonism   . Hearing loss   . Hypertension   . PVC (premature ventricular contraction)   . CVA (cerebral infarction)   . GERD (gastroesophageal reflux disease)   . Diverticulosis   . Sciatica   . Back pain   . Gait disorder   . Cataract   . Edema   . Keratosis   . Sciatica   . Cystic disease of liver   . Paresthesia   . Inguinal hernia   . Hearing loss    Past Surgical History  Procedure Date  . Prostate surgery     cancer  . Hernia repair   . Appendectomy      Medications: Prior to Admission medications   Medication Sig Start Date End Date Taking? Authorizing Provider  acetaminophen (TYLENOL) 500 MG tablet Take 500 mg by mouth at bedtime. To be given with Restoril   Yes Historical Provider, MD  Besifloxacin HCl (BESIVANCE OP) Place 1 drop into the left eye 3 (three) times daily. Beginning 8/13-13 Until bottle is depleted   Yes Historical Provider, MD  carboxymethylcellulose 1 % ophthalmic solution Apply 1 drop to eye 4 (four) times daily.   Yes Historical Provider, MD  cholecalciferol (VITAMIN D) 1000 UNITS tablet Take 1,000 Units by mouth every morning.    Yes Historical Provider, MD  cycloSPORINE (RESTASIS) 0.05 % ophthalmic emulsion Place 1 drop into both eyes 2 (two) times daily.    Yes Historical Provider, MD  ferrous sulfate 325 (65 FE) MG tablet Take 325 mg by mouth 2 (two) times daily.    Yes Historical  Provider, MD  finasteride (PROSCAR) 5 MG tablet Take 5 mg by mouth daily.   Yes Historical Provider, MD  Hypromellose (GENTEAL OP) Apply 1 drop to eye at bedtime.   Yes Historical Provider, MD  lisinopril (PRINIVIL,ZESTRIL) 20 MG tablet Take 20 mg by mouth every morning.   Yes Historical Provider, MD  loperamide (IMODIUM) 2 MG capsule Take 2-4 mg by mouth 4 (four) times daily as needed. For constipation 1st dose 4mg , then 2mg  after each loose stool for 48 hours Maximum of 16mg  in 24 hours   Yes Historical Provider, MD  magnesium hydroxide (MILK OF MAGNESIA) 400 MG/5ML suspension Take 30 mLs by mouth daily as needed. constipation   Yes Historical Provider, MD  Ophthalmic Irrigation Solution (OCUSOFT EYE WASH OP) Apply 1 application to eye 2 (two) times daily. For 2 weeks beginning 10/12/11   Yes Historical Provider, MD  oxycodone (OXY-IR) 5 MG capsule Take 5 mg by mouth every 4 (four) hours as needed. pain   Yes Historical Provider, MD  ranitidine (ZANTAC) 150 MG tablet Take 150 mg by mouth at bedtime.   Yes Historical Provider, MD  senna (SENOKOT) 8.6 MG tablet Take 1 tablet by mouth 2 (two) times daily.    Yes Historical Provider, MD  simethicone (MYLICON) 80 MG chewable tablet Chew 80 mg by mouth 4 (four) times daily -  before meals and at bedtime.   Yes Historical Provider, MD  Tamsulosin HCl (FLOMAX) 0.4 MG CAPS Take 0.4 mg by mouth every morning.    Yes Historical Provider, MD  temazepam (RESTORIL) 30 MG capsule Take 30 mg by mouth at bedtime. For insomnia   Yes Historical Provider, MD  feeding supplement (ENSURE CLINICAL STRENGTH) LIQD Take 237 mLs by mouth 2 (two) times daily with a meal. 04/23/11   Dorothea Ogle, MD    Allergies:   Allergies  Allergen Reactions  . Olanzapine Other (See Comments)    unknown  . Rivastigmine Nausea Only    Social History:  Ambulatory with a walker  Lives at [facility]   reports that he has never smoked. He does not have any  smokeless tobacco history on file. He reports that he drinks alcohol. He reports that he does not use illicit drugs.   Family History: family history includes Hypertension in his mother.    Physical Exam: Patient Vitals for the past 24 hrs:  BP Temp Temp src Pulse Resp SpO2  10/24/11 0311 137/67 mmHg - - 60  16  100 %  10/24/11 0039 125/75 mmHg 97.7 F (36.5 C) Oral 64  16  100 %    1. General:  in No Acute distress 2. Psychological: Alert and Oriented but hard of hearing 3. Head/ENT:   Dry Mucous Membranes                          Head contusion to the top of the scalp, neck supple  Normal  Dentition 4. SKIN:  decreased Skin turgor,  Skin clean Dry and intact no rash 5. Heart: Regular rate and rhythm no Murmur, Rub or gallop 6. Lungs: Clear to auscultation bilaterally, no wheezes or crackles   7. Abdomen: Soft, non-tender, Non distended 8. Lower extremities: no clubbing, cyanosis, or edema 9. Neurologically Grossly intact, moving all 4 extremities equally 10. MSK: Normal range of motion left hand left wrapped in bandage  body mass index is unknown because there is no height or weight on file.   Labs on Admission:   [facility] 10/24/11 0121  NA 126*  K 3.8  CL 94*  CO2 22  GLUCOSE 96  BUN 33*  CREATININE 2.26*  CALCIUM 8.8  MG --  PHOS --    Basename 10/24/11 0121  AST 9  ALT 6  ALKPHOS 53  BILITOT 0.1*  PROT 5.8*  ALBUMIN 2.7*   No results found for this basename: LIPASE:2,AMYLASE:2 in the last 72 hours  Basename 10/24/11 0121  WBC 7.6  NEUTROABS 4.6  HGB 7.6*  HCT 22.2*  MCV 86.7  PLT 242   No results found for this basename: CKTOTAL:3,CKMB:3,CKMBINDEX:3,TROPONINI:3 in the last 72 hours No results found for this basename: TSH,T4TOTAL,FREET3,T3FREE,THYROIDAB in the last 72 hours No results found for this basename: VITAMINB12:2,FOLATE:2,FERRITIN:2,TIBC:2,IRON:2,RETICCTPCT:2 in the last 72 hours No results found for this basename:  HGBA1C    The CrCl is unknown because both a height and weight (above a minimum accepted value) are required for this calculation. ABG No results found for this basename: phart, pco2, po2, hco3, tco2, acidbasedef, o2sat     No results found for this basename: DDIMER    UA no evidence of infection   Cultures:    Component Value Date/Time   SDES BLOOD RIGHT HAND 04/18/2011 2340   SPECREQUEST BOTTLES DRAWN AEROBIC ONLY 1CC 04/18/2011 2340   CULT NO GROWTH 5 DAYS 04/18/2011 2340   REPTSTATUS 04/25/2011 FINAL 04/18/2011 2340       Radiological Exams on Admission: Ct Head Wo Contrast  10/24/2011  *RADIOLOGY REPORT*  Clinical Data: Fall, scalp hematoma.  CT HEAD WITHOUT CONTRAST  Technique:  Contiguous axial images were obtained from the base of the skull through the vertex without contrast.  Comparison: 04/18/2011  Findings: Prominence of the sulci, cisterns, and ventricles, in keeping with volume loss. There are subcortical and periventricular white matter hypodensities, a nonspecific finding most often seen with chronic microangiopathic changes.  There is no evidence for acute hemorrhage, overt hydrocephalus, mass lesion, or abnormal extra-axial fluid collection.  No definite CT evidence for acute cortical based (large artery) infarction. Prior left caudate head lacunar infarction, unchanged underpneumatized left mastoid air cells.  Paranasal sinuses and mastoid air cells are otherwise predominately clear. Atherosclerotic vascular calcifications.  IMPRESSION: Volume loss, white matter changes, and remote left caudate lacunar infarction.  No acute intracranial abnormality identified.   Original Report Authenticated By: Waneta Martins, M.D.    Dg Chest Port 1 View  10/24/2011  *RADIOLOGY REPORT*  Clinical Data: Fall  PORTABLE CHEST - 1 VIEW  Comparison: 04/18/2011  Findings: Prominent cardiomediastinal contours.  Bibasilar opacities.  Senescent changes interstitial prominence.  Osteopenia.  Prior posterior and lateral right side rib fractures.  No definite interval osseous change.  IMPRESSION: Prominent cardiomediastinal contours, suggests aortic tortuosity and or dilatation. Consider follow-up PA and lateral radiographs when the patient can tolerate.  Bibasilar opacities; atelectasis versus infiltrate.   Original Report Authenticated By: Waneta Martins, M.D.  Dg Hand Complete Left  10/24/2011  *RADIOLOGY REPORT*  Clinical Data: 78 year old male status post fall, laceration to the left hand.  LEFT HAND - COMPLETE 3+ VIEW  Comparison: None.  Findings: Traumatic amputation of the tuft of the left fifth distal phalanx.  Associated soft tissue injury.  The middle and proximal fifth phalanges are intact.  Osteopenia. Diffuse changes of osteoarthritis in the interphalangeal joint. Distal radius and ulna appear intact.  Carpal bone alignment within normal limits.  IMPRESSION: 1.  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 2.  No other acute fracture or dislocation.  Diffuse osteoarthritis.   Original Report Authenticated By: Harley Hallmark, M.D.     Chart has been reviewed  Assessment/Plan  31-year-old male who had had a mechanical fall resulting in head injury and trauma to his left hip was found to be anemic with hemoglobin drop since February and hyponatremic  Present on Admission:  .Anemia - source unclear it is possible that patient has been having slow GI blood loss. He didn't benefit from endoscopy/colonoscopy. Likely this been going all over some time he's currently asymptomatic. We'll transfuse 2 units watching telemetry.  .Acute renal failure - patient appears to be clinically dehydrated and fluids obtain renal ultrasound   .Fall - appears to be mechanical in nature no evidence of syncope  .Hypertension - continue home medication  .Parkinsonism - stable  .Hyponatremia - - likely secondary to dehydration, will give IVF, check Urine Na, Cr, Osmolarity.  Monitor Na levels to avoid over aggressive correction. Check TSH. Stop offending medications. If no improvement with IVF will initiate further work up for SIADH if appropriate.     Prophylaxis: SCD Protonix  CODE STATUS: DNR/DNI  Other plan as per orders.  I have spent a total of 55 min on this admission  Vincent Potts 10/24/2011, 4:24 AM

## 2011-10-24 NOTE — Consult Note (Signed)
NAMEMarland Kitchen  DREVIN, ORTNER NO.:  [id number]  MEDICAL RECORD NO.:  [id number]  LOCATION:  6704                         FACILITY:  [facility]  PHYSICIAN:  Dionne Ano. [name], M.D.DATE OF BIRTH:  Sep 17, 1924  DATE OF CONSULTATION: DATE OF DISCHARGE:                                CONSULTATION   HISTORY OF PRESENT ILLNESS:  I had the pleasure to see Vincent Potts for consultation today.  This patient was seen for consult in regard     Dionne Ano. Amanda Pea, M.D.     [facility]  D:  10/24/2011  T:  10/24/2011  Job:  [id number]

## 2011-10-24 NOTE — Consult Note (Signed)
  Please see full dictated consult [phone]  Diagnosis: Left small finger amputation treated with operative revision amputation at the distal interphalangeal joint left small finger.  Patient tolerated the procedure well he underwent revision amputation he was given Ancef preoperatively and I will recommend Ancef postoperatively x24 hours. I will see him back in my office once discharged for care of his revision amputation.  Dominica Severin M.D.

## 2011-10-24 NOTE — ED Notes (Signed)
Report called to RN.  Rn notified that pt. Blood not started due to Dr. Amanda Pea fixing left pinky finger amputation.

## 2011-10-24 NOTE — Progress Notes (Signed)
Orthopedic Tech Progress Note Patient Details:  Vincent Potts 28-Sep-1924 [id number]  Ortho Devices Type of Ortho Device: Finger splint   [name] 10/24/2011, 1:49 PM

## 2011-10-24 NOTE — ED Notes (Signed)
Lives at assisted living facility came to the front desk said he fell, has laceration to pinky finger left hand.  Hematoma to top of head.  Chronically confused, gait instability.  Did not want to come to the hospital, however, pinky finger has tip missing.  Patient cannot advise what happened to him.

## 2011-10-24 NOTE — Consult Note (Signed)
[facility] Gastroenterology Consultation Note  Referring Provider: Dr. Penny Pia [facility])  Reason for Consultation:  Anemia, hemoccult-positive stool  HPI: Vincent Potts is a 78 y.o. male admitted after fall with injury to left side and head (CT uncontrasted negative).  Was found to have anemia with hemoccult-positive stool.  Has history Parkinson's with chronic constipation, and has noticed some intermittent hematochezia occasionally.  No other blood in stool.  No abdominal pain.  Had colonoscopy by his report 3-4 years ago, doesn't recall results.  Doesn't recall having ever had endoscopy.  Appears to have been variably anemic dating back to at least 2009.   Past Medical History  Diagnosis Date  . Thyroid disease   . Anemia   . Vitamin D deficiency disease   . Hyponatremia   . Anxiety   . Parkinsonism   . Hearing loss   . Hypertension   . PVC (premature ventricular contraction)   . CVA (cerebral infarction)   . GERD (gastroesophageal reflux disease)   . Diverticulosis   . Sciatica   . Back pain   . Gait disorder   . Cataract   . Edema   . Keratosis   . Sciatica   . Cystic disease of liver   . Paresthesia   . Inguinal hernia   . Hearing loss   . Stroke 2010    no deficits  . Chronic kidney disease 2013    rhabdo  . H/O hiatal hernia 2008    repaired    Past Surgical History  Procedure Date  . Prostate surgery     cancer  . Hernia repair   . Appendectomy     Prior to Admission medications   Medication Sig Start Date End Date Taking? Authorizing Provider  acetaminophen (TYLENOL) 500 MG tablet Take 500 mg by mouth at bedtime. To be given with Restoril   Yes Historical Provider, MD  Besifloxacin HCl (BESIVANCE OP) Place 1 drop into the left eye 3 (three) times daily. Beginning 8/13-13 Until bottle is depleted   Yes Historical Provider, MD  carboxymethylcellulose 1 % ophthalmic solution Apply 1 drop to eye 4 (four) times daily.   Yes Historical Provider, MD    cholecalciferol (VITAMIN D) 1000 UNITS tablet Take 1,000 Units by mouth every morning.    Yes Historical Provider, MD  cycloSPORINE (RESTASIS) 0.05 % ophthalmic emulsion Place 1 drop into both eyes 2 (two) times daily.    Yes Historical Provider, MD  ferrous sulfate 325 (65 FE) MG tablet Take 325 mg by mouth 2 (two) times daily.    Yes Historical Provider, MD  finasteride (PROSCAR) 5 MG tablet Take 5 mg by mouth daily.   Yes Historical Provider, MD  Hypromellose (GENTEAL OP) Apply 1 drop to eye at bedtime.   Yes Historical Provider, MD  lisinopril (PRINIVIL,ZESTRIL) 20 MG tablet Take 20 mg by mouth every morning.   Yes Historical Provider, MD  loperamide (IMODIUM) 2 MG capsule Take 2-4 mg by mouth 4 (four) times daily as needed. For constipation 1st dose 4mg , then 2mg  after each loose stool for 48 hours Maximum of 16mg  in 24 hours   Yes Historical Provider, MD  magnesium hydroxide (MILK OF MAGNESIA) 400 MG/5ML suspension Take 30 mLs by mouth daily as needed. constipation   Yes Historical Provider, MD  Ophthalmic Irrigation Solution (OCUSOFT EYE WASH OP) Apply 1 application to eye 2 (two) times daily. For 2 weeks beginning 10/12/11   Yes Historical Provider, MD  oxycodone (OXY-IR) 5 MG capsule  Take 5 mg by mouth every 4 (four) hours as needed. pain   Yes Historical Provider, MD  ranitidine (ZANTAC) 150 MG tablet Take 150 mg by mouth at bedtime.   Yes Historical Provider, MD  senna (SENOKOT) 8.6 MG tablet Take 1 tablet by mouth 2 (two) times daily.    Yes Historical Provider, MD  simethicone (MYLICON) 80 MG chewable tablet Chew 80 mg by mouth 4 (four) times daily -  before meals and at bedtime.   Yes Historical Provider, MD  Tamsulosin HCl (FLOMAX) 0.4 MG CAPS Take 0.4 mg by mouth every morning.    Yes Historical Provider, MD  temazepam (RESTORIL) 30 MG capsule Take 30 mg by mouth at bedtime. For insomnia   Yes Historical Provider, MD  feeding supplement (ENSURE CLINICAL STRENGTH) LIQD Take 237 mLs  by mouth 2 (two) times daily with a meal. 04/23/11   Dorothea Ogle, MD    Current Facility-Administered Medications  Medication Dose Route Frequency Provider Last Rate Last Dose  . 0.9 %  sodium chloride infusion   Intravenous Continuous Anastassia Doutova, MD      . 0.9 %  sodium chloride infusion  500 mL Intravenous Once Therisa Doyne, MD      . acetaminophen (TYLENOL) tablet 650 mg  650 mg Oral Q6H PRN Therisa Doyne, MD       Or  . acetaminophen (TYLENOL) suppository 650 mg  650 mg Rectal Q6H PRN Therisa Doyne, MD      . acetaminophen (TYLENOL) tablet 500 mg  500 mg Oral QHS Therisa Doyne, MD      . acetaminophen (TYLENOL) tablet 650 mg  650 mg Oral Once Therisa Doyne, MD   650 mg at 10/24/11 0846  . albuterol (PROVENTIL) (5 MG/ML) 0.5% nebulizer solution 2.5 mg  2.5 mg Nebulization Q2H PRN Therisa Doyne, MD      . ceFAZolin (ANCEF) IVPB 1 g/50 mL premix  1 g Intravenous Once Loren Racer, MD   1 g at 10/24/11 0356  . ceFAZolin (ANCEF) IVPB 1 g/50 mL premix  1 g Intravenous Q8H Dominica Severin, MD      . cycloSPORINE (RESTASIS) 0.05 % ophthalmic emulsion 1 drop  1 drop Both Eyes BID Therisa Doyne, MD   1 drop at 10/24/11 1211  . diphenhydrAMINE (BENADRYL) capsule 25 mg  25 mg Oral Once Therisa Doyne, MD   25 mg at 10/24/11 0846  . docusate sodium (COLACE) capsule 100 mg  100 mg Oral BID Therisa Doyne, MD   100 mg at 10/24/11 1215  . finasteride (PROSCAR) tablet 5 mg  5 mg Oral Daily Therisa Doyne, MD   5 mg at 10/24/11 1209  . gatifloxacin (ZYMAXID) 0.5 % ophthalmic drops 1 drop  1 drop Left Eye TID Therisa Doyne, MD   1 drop at 10/24/11 1208  . guaiFENesin-dextromethorphan (ROBITUSSIN DM) 100-10 MG/5ML syrup 5 mL  5 mL Oral Q4H PRN Therisa Doyne, MD      . lisinopril (PRINIVIL,ZESTRIL) tablet 20 mg  20 mg Oral q morning - 10a Penny Pia, MD      . morphine 2 MG/ML injection 2 mg  2 mg Intravenous Q4H PRN Therisa Doyne, MD       . ondansetron (ZOFRAN) tablet 4 mg  4 mg Oral Q6H PRN Therisa Doyne, MD       Or  . ondansetron (ZOFRAN) injection 4 mg  4 mg Intravenous Q6H PRN Therisa Doyne, MD      . oxyCODONE (Oxy IR/ROXICODONE) immediate  release tablet 5 mg  5 mg Oral Q4H PRN Therisa Doyne, MD      . pantoprazole (PROTONIX) 80 mg in sodium chloride 0.9 % 100 mL IVPB  80 mg Intravenous Once Loren Racer, MD   80 mg at 10/24/11 0452  . pantoprazole (PROTONIX) injection 40 mg  40 mg Intravenous Q12H Therisa Doyne, MD      . polyvinyl alcohol (LIQUIFILM TEARS) 1.4 % ophthalmic solution 1 drop  1 drop Both Eyes QID Therisa Doyne, MD   1 drop at 10/24/11 1214  . senna (SENOKOT) tablet 8.6 mg  1 tablet Oral BID Therisa Doyne, MD   8.6 mg at 10/24/11 1209  . sodium chloride 0.9 % bolus 500 mL  500 mL Intravenous Once Loren Racer, MD   500 mL at 10/24/11 0356  . sodium chloride 0.9 % injection 3 mL  3 mL Intravenous Q12H Therisa Doyne, MD      . Tamsulosin HCl (FLOMAX) capsule 0.4 mg  0.4 mg Oral q morning - 10a Therisa Doyne, MD   0.4 mg at 10/24/11 1209  . TDaP (BOOSTRIX) injection 0.5 mL  0.5 mL Intramuscular Once Loren Racer, MD   0.5 mL at 10/24/11 0231  . zolpidem (AMBIEN) tablet 5 mg  5 mg Oral QHS PRN Therisa Doyne, MD      . DISCONTD: Besifloxacin HCl 0.6 % SUSP 1 drop  1 drop Left Eye TID Therisa Doyne, MD      . DISCONTD: carboxymethylcellulose 1 % ophthalmic solution 1 drop  1 drop Ophthalmic QID Therisa Doyne, MD      . DISCONTD: oxycodone (OXY-IR) immediate release capsule 5 mg  5 mg Oral Q4H PRN Therisa Doyne, MD      . DISCONTD: pantoprazole (PROTONIX) 80 mg in sodium chloride 0.9 % 250 mL infusion  8 mg/hr Intravenous Continuous Loren Racer, MD 25 mL/hr at 10/24/11 0452 8 mg/hr at 10/24/11 0452  . DISCONTD: senna (SENOKOT) tablet 8.6 mg  1 tablet Oral BID Therisa Doyne, MD      . DISCONTD: temazepam (RESTORIL) capsule 30 mg  30 mg  Oral QHS Therisa Doyne, MD        Allergies as of 10/24/2011 - Review Complete 10/24/2011  Allergen Reaction Noted  . Olanzapine Other (See Comments) 11/10/2010  . Rivastigmine Nausea Only 11/10/2010    Family History  Problem Relation Age of Onset  . Hypertension Mother     History   Social History  . Marital Status: Single    Spouse Name: N/A    Number of Children: N/A  . Years of Education: N/A   Occupational History  . Not on file.   Social History Main Topics  . Smoking status: Never Smoker   . Smokeless tobacco: Not on file  . Alcohol Use: Yes     occasionaly  . Drug Use: No  . Sexually Active: No   Other Topics Concern  . Not on file   Social History Narrative  . No narrative on file    Review of Systems: Reviewed 10/24/11 Dr. Adela Glimpse, and I agree  Physical Exam: Vital signs in last 24 hours: Temp:  [97.3 F (36.3 C)-97.8 F (36.6 C)] 97.8 F (36.6 C) (08/25 1300) Pulse Rate:  [42-71] 43  (08/25 1300) Resp:  [16-20] 18  (08/25 1300) BP: (118-144)/(65-75) 124/68 mmHg (08/25 1300) SpO2:  [93 %-100 %] 100 % (08/25 1110) Weight:  [59.3 kg (130 lb 11.7 oz)] 59.3 kg (130 lb 11.7 oz) (08/25 0906)  General:   Alert,  Bradykinetic from Parkinson's with flat facies, NAD Head:  Normocephalic and atraumatic. Eyes:  Sclera clear, no icterus.   Conjunctiva somewhat pale. Ears:  Normal auditory acuity. Nose:  No deformity, discharge,  or lesions. Mouth:  No deformity or lesions.  Oropharynx pink & moist. Neck:  Supple; no masses or thyromegaly. Lungs:  Clear throughout to auscultation.   No wheezes, crackles, or rhonchi. No acute distress. Heart:  Regular rate and rhythm; no murmurs, clicks, rubs,  or gallops. Abdomen:  Soft, nontender and nondistended. No masses, hepatosplenomegaly or hernias noted. Normal bowel sounds, without guarding, and without rebound.     Msk:  Wrapping on left arm; left-sided trauma Pulses:  Normal pulses noted. Extremities:   Without clubbing or edema. Neurologic:  Flat facies, bradykinetic Skin:  Intact without significant lesions or rashes. Psych:  Alert and cooperative. Normal mood and affect.  Lab Results:  [facility] 10/24/11 0835 10/24/11 0121  WBC 7.0 7.6  HGB 9.0* 7.6*  HCT 26.6* 22.2*  PLT 281 242   BMET  Basename 10/24/11 0835 10/24/11 0121  NA 129* 126*  K 3.9 3.8  CL 97 94*  CO2 17* 22  GLUCOSE 117* 96  BUN 30* 33*  CREATININE 1.95* 2.26*  CALCIUM 8.9 8.8   LFT  Basename 10/24/11 0835  PROT 6.3  ALBUMIN 2.8*  AST 15  ALT 7  ALKPHOS 60  BILITOT 0.1*  BILIDIR --  IBILI --   PT/INR  Basename 10/24/11 0121  LABPROT 16.0*  INR 1.25    Studies/Results: Ct Head Wo Contrast  10/24/2011  *RADIOLOGY REPORT*  Clinical Data: Fall, scalp hematoma.  CT HEAD WITHOUT CONTRAST  Technique:  Contiguous axial images were obtained from the base of the skull through the vertex without contrast.  Comparison: 04/18/2011  Findings: Prominence of the sulci, cisterns, and ventricles, in keeping with volume loss. There are subcortical and periventricular white matter hypodensities, a nonspecific finding most often seen with chronic microangiopathic changes.  There is no evidence for acute hemorrhage, overt hydrocephalus, mass lesion, or abnormal extra-axial fluid collection.  No definite CT evidence for acute cortical based (large artery) infarction. Prior left caudate head lacunar infarction, unchanged underpneumatized left mastoid air cells.  Paranasal sinuses and mastoid air cells are otherwise predominately clear. Atherosclerotic vascular calcifications.  IMPRESSION: Volume loss, white matter changes, and remote left caudate lacunar infarction.  No acute intracranial abnormality identified.   Original Report Authenticated By: Waneta Martins, M.D.    US Renal  10/24/2011  *RADIOLOGY REPORT*  Clinical Data: Chronic kidney disease  RENAL/URINARY TRACT ULTRASOUND COMPLETE  Comparison:  None.  Findings:   Right Kidney:  Measures 9.0 cm. Echogenic renal parenchyma, likely reflecting medical renal disease.  1.5 x 1.3 x 1.7 cm lower pole cyst.  Additional 9 x 7 by 8 mm hypoechoic lower pole lesion, likely reflecting a cyst, although not simple by ultrasound.  9 mm calculus in the interpolar region.  No hydronephrosis.  Left Kidney:  Measures 9.4 cm.  Echogenic renal parenchyma, likely reflecting medical renal disease.  12 mm calculus in the interpolar region.  Bladder:  Poorly visualized/decompressed.  Additional comments:  Suspected small right pleural effusion.  IMPRESSION: Small bilateral kidneys with echogenic renal parenchyma, likely reflecting medical renal disease.  12 mm nonobstructing left renal calculus.  9 mm nonobstructing right renal calculus.  No hydronephrosis.  9 mm probable left lower pole cyst, although not simple by ultrasound.  Given small size, consider follow-up ultrasound  in 6 months.  MR abdomen could also be considered, although patient would only be eligible for (half-dose) contrast if GFR remains > 30.   Original Report Authenticated By: Charline Bills, M.D.    Dg Chest Port 1 View  10/24/2011  *RADIOLOGY REPORT*  Clinical Data: Fall  PORTABLE CHEST - 1 VIEW  Comparison: 04/18/2011  Findings: Prominent cardiomediastinal contours.  Bibasilar opacities.  Senescent changes interstitial prominence.  Osteopenia. Prior posterior and lateral right side rib fractures.  No definite interval osseous change.  IMPRESSION: Prominent cardiomediastinal contours, suggests aortic tortuosity and or dilatation. Consider follow-up PA and lateral radiographs when the patient can tolerate.  Bibasilar opacities; atelectasis versus infiltrate.   Original Report Authenticated By: Waneta Martins, M.D.    Dg Hand Complete Left  10/24/2011  *RADIOLOGY REPORT*  Clinical Data: 78 year old male status post fall, laceration to the left hand.  LEFT HAND - COMPLETE 3+ VIEW  Comparison: None.  Findings: Traumatic  amputation of the tuft of the left fifth distal phalanx.  Associated soft tissue injury.  The middle and proximal fifth phalanges are intact.  Osteopenia. Diffuse changes of osteoarthritis in the interphalangeal joint. Distal radius and ulna appear intact.  Carpal bone alignment within normal limits.  IMPRESSION: 1.  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 2.  No other acute fracture or dislocation.  Diffuse osteoarthritis.   Original Report Authenticated By: Harley Hallmark, M.D.    Impression:  1.  Anemia with hemoccult-positive stool. 2.  Recent fall.  Unclear if anemia played role, as he has been intermittently anemic for years. 3.  Parkinson's disease.  Plan:  1.  Continue supportive care. 2.  Diet today = OK. 3.  Will tentatively plan for endoscopy tomorrow.   LOS: 0 days   Tanica Gaige M  10/24/2011, 1:47 PM

## 2011-10-25 ENCOUNTER — Encounter (HOSPITAL_COMMUNITY)
Admission: EM | Disposition: A | Discharge status: Skilled Nursing Facility | Payer: Self-pay | Source: Home / Self Care | Attending: Internal Medicine

## 2011-10-25 DIAGNOSIS — S6990XA Unspecified injury of unspecified wrist, hand and finger(s), initial encounter: Secondary | ICD-10-CM | POA: Diagnosis not present

## 2011-10-25 DIAGNOSIS — N179 Acute kidney failure, unspecified: Secondary | ICD-10-CM | POA: Diagnosis not present

## 2011-10-25 DIAGNOSIS — D509 Iron deficiency anemia, unspecified: Secondary | ICD-10-CM | POA: Diagnosis not present

## 2011-10-25 DIAGNOSIS — K921 Melena: Secondary | ICD-10-CM | POA: Diagnosis not present

## 2011-10-25 DIAGNOSIS — R4182 Altered mental status, unspecified: Secondary | ICD-10-CM | POA: Diagnosis not present

## 2011-10-25 DIAGNOSIS — S6980XA Other specified injuries of unspecified wrist, hand and finger(s), initial encounter: Secondary | ICD-10-CM

## 2011-10-25 DIAGNOSIS — N289 Disorder of kidney and ureter, unspecified: Secondary | ICD-10-CM

## 2011-10-25 DIAGNOSIS — N200 Calculus of kidney: Secondary | ICD-10-CM | POA: Diagnosis present

## 2011-10-25 DIAGNOSIS — K922 Gastrointestinal hemorrhage, unspecified: Secondary | ICD-10-CM | POA: Diagnosis not present

## 2011-10-25 DIAGNOSIS — I10 Essential (primary) hypertension: Secondary | ICD-10-CM

## 2011-10-25 HISTORY — PX: ESOPHAGOGASTRODUODENOSCOPY: SHX5428

## 2011-10-25 LAB — CBC
Hemoglobin: 9.6 g/dL — ABNORMAL LOW (ref 13.0–17.0)
MCH: 29.8 pg (ref 26.0–34.0)
MCHC: 34.8 g/dL (ref 30.0–36.0)

## 2011-10-25 LAB — TYPE AND SCREEN
ABO/RH(D): A POS
Antibody Screen: NEGATIVE
Unit division: 0

## 2011-10-25 LAB — BASIC METABOLIC PANEL
BUN: 28 mg/dL — ABNORMAL HIGH (ref 6–23)
CO2: 19 mEq/L (ref 19–32)
Calcium: 8.6 mg/dL (ref 8.4–10.5)
GFR calc non Af Amer: 28 mL/min — ABNORMAL LOW (ref >90)
GFR calc non Af Amer: 28 mL/min — ABNORMAL LOW (ref >90)
Glucose, Bld: 87 mg/dL (ref 70–99)
Glucose, Bld: 87 mg/dL (ref 70–99)
Potassium: 4.3 mEq/L (ref 3.5–5.1)
Potassium: 4.1 mEq/L (ref 3.5–5.1)
Sodium: 132 mEq/L — ABNORMAL LOW (ref 135–145)

## 2011-10-25 LAB — TSH: TSH: 0.932 u[IU]/mL (ref 0.350–4.500)

## 2011-10-25 LAB — SODIUM: Sodium: 133 mEq/L — ABNORMAL LOW (ref 135–145)

## 2011-10-25 SURGERY — EGD (ESOPHAGOGASTRODUODENOSCOPY)
Anesthesia: Moderate Sedation

## 2011-10-25 MED ORDER — FENTANYL CITRATE 0.05 MG/ML IJ SOLN
INTRAMUSCULAR | Status: DC | PRN
  Administered 2011-10-25 (×2): 12.5 ug via INTRAVENOUS

## 2011-10-25 MED ORDER — FENTANYL CITRATE 0.05 MG/ML IJ SOLN
INTRAMUSCULAR | Status: AC
  Filled 2011-10-25: qty 4

## 2011-10-25 MED ORDER — MIDAZOLAM HCL 5 MG/ML IJ SOLN
INTRAMUSCULAR | Status: AC
  Filled 2011-10-25: qty 2

## 2011-10-25 MED ORDER — SODIUM CHLORIDE 0.9 % IV SOLN
INTRAVENOUS | Status: DC
  Administered 2011-10-25 – 2011-10-28 (×4): via INTRAVENOUS

## 2011-10-25 MED ORDER — SODIUM CHLORIDE 0.9 % IV SOLN: INTRAVENOUS | Status: DC

## 2011-10-25 MED ORDER — MIDAZOLAM HCL 10 MG/2ML IJ SOLN
INTRAMUSCULAR | Status: DC | PRN
  Administered 2011-10-25 (×3): .5 mg via INTRAVENOUS

## 2011-10-25 MED ORDER — BUTAMBEN-TETRACAINE-BENZOCAINE 2-2-14 % EX AERO
INHALATION_SPRAY | CUTANEOUS | Status: DC | PRN
  Administered 2011-10-25: 1 via TOPICAL

## 2011-10-25 MED ORDER — DIPHENHYDRAMINE HCL 50 MG/ML IJ SOLN
INTRAMUSCULAR | Status: AC
  Filled 2011-10-25: qty 1

## 2011-10-25 MED ORDER — CEFAZOLIN SODIUM 1-5 GM-% IV SOLN
1.0000 g | Freq: Two times a day (BID) | INTRAVENOUS | Status: DC
  Administered 2011-10-25 – 2011-10-28 (×6): 1 g via INTRAVENOUS
  Filled 2011-10-25 (×7): qty 50

## 2011-10-25 NOTE — Progress Notes (Signed)
Utilization review completed.  

## 2011-10-25 NOTE — Op Note (Signed)
[facility] [address] [place], [address]   ENDOSCOPY PROCEDURE REPORT  PATIENT: Vincent Potts, Vincent Potts  MR#: [id number] BIRTHDATE: 1924-07-12 , 87  yrs. old GENDER: Male ENDOSCOPIST:Vihan Randa Evens, MD REFERRED BY: Murray Hodgkins PROCEDURE DATE:  10/25/2011 PROCEDURE:   EGD, diagnostic ASA CLASS:    Class III INDICATIONS: occult blood positive, anemia, and acute post hemorrhagic anemia. MEDICATION: Fentanyl 25 mg IV, Versed 1.5 mg IV, and Cetacaine spray x 2 1 spray TOPICAL ANESTHETIC:  DESCRIPTION OF PROCEDURE:   After the risks and benefits of the procedure were explained, informed consent was obtained.  The Pentax Gastroscope [id number]  endoscope was introduced through the mouth  and advanced to the second portion of the duodenum .  The instrument was slowly withdrawn as the mucosa was fully examined. Unableto swallow the adult scope either passing it blindly or directly. We switched to the pediatric scope and the patient was able to swallow the pediatric scope.      ESOPHAGUS: The mucosa of the esophagus appeared normal.  STOMACH: The mucosa of the stomach appeared normal.  DUODENUM: The duodenal mucosa showed no abnormalities. Retroflexed views revealed no abnormalities.    The scope was then withdrawn from the patient and the procedure completed.  COMPLICATIONS: There were no complications.   ENDOSCOPIC IMPRESSION: 1.   The mucosa of the esophagus appeared normal 2.   The mucosa of the stomach appeared normal 3.   The duodenal mucosa showed no abnormalities 4.  No source of heme positive stool found on EGD RECOMMENDATIONS: follow patient clinically.  We'll try to determine where he had a colonoscopy performed with results    _______________________________ eSigned:  Carman Ching, MD 10/25/2011 3:22 PM      PATIENT NAME:  [name], [name] MR#: [id number]

## 2011-10-25 NOTE — Progress Notes (Signed)
Subjective:     Patient reports pain as mild. Patient states his hand is doing quite well status post left small finger revision amputation secondary to traumatic injury yesterday. He is alert he is urinating at bedside.    Objective: Vital signs in last 24 hours: Temp:  [97.3 F (36.3 C)-98.2 F (36.8 C)] 97.9 F (36.6 C) (08/26 0432) Pulse Rate:  [42-71] 46  (08/26 0432) Resp:  [16-20] 18  (08/26 0432) BP: (107-150)/(59-77) 144/76 mmHg (08/26 0432) SpO2:  [92 %-100 %] 92 % (08/26 0432) Weight:  [59.3 kg (130 lb 11.7 oz)-59.9 kg (132 lb 0.9 oz)] 59.9 kg (132 lb 0.9 oz) (08/25 2300)  Intake/Output from previous day: 08/25 0701 - 08/26 0700 In: 2740 [P.O.:600; I.V.:640; Blood:1450; IV Piggyback:50] Out: 1053 [Urine:1053] Intake/Output this shift:     Basename 10/25/11 0142 10/24/11 2230 10/24/11 1640 10/24/11 0835 10/24/11 0121  HGB 9.6* 9.7* 9.8* 9.0* 7.6*    Basename 10/25/11 0142 10/24/11 2230  WBC 6.5 6.9  RBC 3.22* 3.22*  HCT 27.6* 27.6*  PLT 228 247    Basename 10/25/11 0142 10/24/11 2230 10/24/11 0835  NA 128* 128* --  K 4.3 -- 3.9  CL 99 -- 97  CO2 19 -- 17*  BUN 28* -- 30*  CREATININE 2.07* -- 1.95*  GLUCOSE 87 -- 117*  CALCIUM 8.6 -- 8.9    Basename 10/24/11 0121  LABPT --  INR 1.25   His left hand bandages clean dry and intact. He has no complications to date regarding the left upper extremity which we provided care for in the form of revision amputation. He and I discussed this at length. He is voiding well at bedside he has a procedure scheduled for today (EGD). Neurovascular intact No cellulitis present Compartment soft  Assessment/Plan:     Patient is stable from my standpoint we'll monitor his condition while he is here. I will plan for dressing change a week. If his discharge back to his facility that I would like to see him back in my office in 7 days. He looks quite stable from my standpoint this juncture status post revision amputation to  the small finger left hand. He understands this. With a long discussion at bedside today. Although he is very hard of hearing he understands the prognosis plans and aftercare.  Vincent Potts,Lourdez Mcgahan M 10/25/2011, 7:59 AM

## 2011-10-25 NOTE — Interval H&P Note (Signed)
History and Physical Interval Note:  10/25/2011 2:36 PM  Marlowe Sax  has presented today for surgery, with the diagnosis of gi bleed  The various methods of treatment have been discussed with the patient and family. After consideration of risks, benefits and other options for treatment, the patient has consented to  Procedure(s) (LRB): ESOPHAGOGASTRODUODENOSCOPY (EGD) (N/A) as a surgical intervention .  The patient's history has been reviewed, patient examined, no change in status, stable for surgery.  I have reviewed the patient's chart and labs.  Questions were answered to the patient's satisfaction.     Loyce Klasen JR,Macgregor L

## 2011-10-25 NOTE — Progress Notes (Signed)
[facility] PROGRESS NOTE  Vincent Potts BJY:[id number] DOB: Jul 13, 1924 DOA: 10/24/2011 PCP: Kimber Relic, MD  Assessment/Plan: Active Problems:  Parkinsonism  Hypertension  Acute renal failure  Anemia  Fall  Hyponatremia  1. Anemia: - GI on board.  Patient for endoscopy today.  Will follow up with their results and recommendations.  2. Nephrolithiasis - repeat U/S in 6 months  3. ARF - Creatinine currently trending down.  U/S shows non obstructive nephrolithiasis.  Will continue with gentle fluid rehydration and reassess.  Suspecting mainly prerenal causes given history.  4. Hyponatremia - Improving on IVF's.  Likely secondary to poor oral solute intake.  As such will continue gentle fluid hydration overnight and reassess next am.  5. Fall - May have been secondary to symptomatic anemia, parkinsons, and hyponatremia.  At this point would continue fall precautions. - Ortho following and we will f/u with their recommendations.  Pt is s/p left pinky amputation.   Code Status: DNR Family Communication: None at bedside Disposition Plan: Pending current work up and GI recommendations.   Brief narrative: Please see HPI  Consultants:  GI  Orthopaedics  Procedures:  Left pinky partial amputation  Antibiotics:  cefazolin  HPI/Subjective: No acute issues overnight.  No new complaints.  Objective: Filed Vitals:   10/25/11 1500 10/25/11 1510 10/25/11 1520 10/25/11 1600  BP: 136/74 128/66 127/70 125/57  Pulse:    48  Temp:    98 F (36.7 C)  TempSrc:    Oral  Resp: 12 18 21 16   Height:      Weight:      SpO2: 96% 95% 97% 98%    Intake/Output Summary (Last 24 hours) at 10/25/11 2034 Last data filed at 10/25/11 1700  Gross per 24 hour  Intake    240 ml  Output   1155 ml  Net   -915 ml   Filed Weights   10/24/11 0638 10/24/11 0906 10/24/11 2300  Weight: 59.3 kg (130 lb 11.7 oz) 59.3 kg (130 lb 11.7 oz) 59.9 kg (132 lb 0.9 oz)     Exam:   General:  Pt in NAD, Alert and Awake  Cardiovascular: RRR, No MRG  Respiratory: CTA Bl, no wheezes  Abdomen: Soft, NT, ND  Data Reviewed: Basic Metabolic Panel:  Lab 10/25/11 0865 10/25/11 1210 10/25/11 0814 10/25/11 0142 10/24/11 2230 10/24/11 0835 10/24/11 0121  NA 131* 784*696* 131* 128* 128* -- --  K -- 4.1 -- 4.3 -- 3.9 3.8  CL -- 102 -- 99 -- 97 94*  CO2 -- 18* -- 19 -- 17* 22  GLUCOSE -- 87 -- 87 -- 117* 96  BUN -- 26* -- 28* -- 30* 33*  CREATININE -- 2.04* -- 2.07* -- 1.95* 2.26*  CALCIUM -- 8.7 -- 8.6 -- 8.9 8.8  MG -- -- -- 2.0 -- -- --  PHOS -- -- -- 3.1 -- -- --   Liver Function Tests:  Lab 10/24/11 0835 10/24/11 0121  AST 15 9  ALT 7 6  ALKPHOS 60 53  BILITOT 0.1* 0.1*  PROT 6.3 5.8*  ALBUMIN 2.8* 2.7*   No results found for this basename: LIPASE:5,AMYLASE:5 in the last 168 hours No results found for this basename: AMMONIA:5 in the last 168 hours CBC:  Lab 10/25/11 0142 10/24/11 2230 10/24/11 1640 10/24/11 0835 10/24/11 0121  WBC 6.5 6.9 5.8 7.0 7.6  NEUTROABS -- -- -- -- 4.6  HGB 9.6* 9.7* 9.8* 9.0* 7.6*  HCT 27.6* 27.6* 28.6* 26.6* 22.2*  MCV 85.7 85.7 86.9 87.8 86.7  PLT 228 247 260 281 242   Cardiac Enzymes: No results found for this basename: CKTOTAL:5,CKMB:5,CKMBINDEX:5,TROPONINI:5 in the last 168 hours BNP (last 3 results) No results found for this basename: PROBNP:3 in the last 8760 hours CBG: No results found for this basename: GLUCAP:5 in the last 168 hours  Recent Results (from the past 240 hour(s))  MRSA PCR SCREENING     Status: Abnormal   Collection Time   10/24/11  6:43 AM      Component Value Range Status Comment   MRSA by PCR POSITIVE (*) NEGATIVE Final      Studies: Ct Head Wo Contrast  10/24/2011  *RADIOLOGY REPORT*  Clinical Data: Fall, scalp hematoma.  CT HEAD WITHOUT CONTRAST  Technique:  Contiguous axial images were obtained from the base of the skull through the vertex without contrast.  Comparison:  04/18/2011  Findings: Prominence of the sulci, cisterns, and ventricles, in keeping with volume loss. There are subcortical and periventricular white matter hypodensities, a nonspecific finding most often seen with chronic microangiopathic changes.  There is no evidence for acute hemorrhage, overt hydrocephalus, mass lesion, or abnormal extra-axial fluid collection.  No definite CT evidence for acute cortical based (large artery) infarction. Prior left caudate head lacunar infarction, unchanged underpneumatized left mastoid air cells.  Paranasal sinuses and mastoid air cells are otherwise predominately clear. Atherosclerotic vascular calcifications.  IMPRESSION: Volume loss, white matter changes, and remote left caudate lacunar infarction.  No acute intracranial abnormality identified.   Original Report Authenticated By: Waneta Martins, M.D.    US Renal  10/24/2011  *RADIOLOGY REPORT*  Clinical Data: Chronic kidney disease  RENAL/URINARY TRACT ULTRASOUND COMPLETE  Comparison:  None.  Findings:  Right Kidney:  Measures 9.0 cm. Echogenic renal parenchyma, likely reflecting medical renal disease.  1.5 x 1.3 x 1.7 cm lower pole cyst.  Additional 9 x 7 by 8 mm hypoechoic lower pole lesion, likely reflecting a cyst, although not simple by ultrasound.  9 mm calculus in the interpolar region.  No hydronephrosis.  Left Kidney:  Measures 9.4 cm.  Echogenic renal parenchyma, likely reflecting medical renal disease.  12 mm calculus in the interpolar region.  Bladder:  Poorly visualized/decompressed.  Additional comments:  Suspected small right pleural effusion.  IMPRESSION: Small bilateral kidneys with echogenic renal parenchyma, likely reflecting medical renal disease.  12 mm nonobstructing left renal calculus.  9 mm nonobstructing right renal calculus.  No hydronephrosis.  9 mm probable left lower pole cyst, although not simple by ultrasound.  Given small size, consider follow-up ultrasound in 6 months.  MR abdomen  could also be considered, although patient would only be eligible for (half-dose) contrast if GFR remains > 30.   Original Report Authenticated By: Charline Bills, M.D.    Dg Chest Port 1 View  10/24/2011  *RADIOLOGY REPORT*  Clinical Data: Fall  PORTABLE CHEST - 1 VIEW  Comparison: 04/18/2011  Findings: Prominent cardiomediastinal contours.  Bibasilar opacities.  Senescent changes interstitial prominence.  Osteopenia. Prior posterior and lateral right side rib fractures.  No definite interval osseous change.  IMPRESSION: Prominent cardiomediastinal contours, suggests aortic tortuosity and or dilatation. Consider follow-up PA and lateral radiographs when the patient can tolerate.  Bibasilar opacities; atelectasis versus infiltrate.   Original Report Authenticated By: Waneta Martins, M.D.    Dg Hand Complete Left  10/24/2011  *RADIOLOGY REPORT*  Clinical Data: 78 year old male status post fall, laceration to the left hand.  LEFT HAND - COMPLETE  3+ VIEW  Comparison: None.  Findings: Traumatic amputation of the tuft of the left fifth distal phalanx.  Associated soft tissue injury.  The middle and proximal fifth phalanges are intact.  Osteopenia. Diffuse changes of osteoarthritis in the interphalangeal joint. Distal radius and ulna appear intact.  Carpal bone alignment within normal limits.  IMPRESSION: 1.  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 2.  No other acute fracture or dislocation.  Diffuse osteoarthritis.   Original Report Authenticated By: Ulla Potash III, M.D.     Scheduled Meds:   . sodium chloride  500 mL Intravenous Once  . acetaminophen  500 mg Oral QHS  .  ceFAZolin (ANCEF) IV  1 g Intravenous Q12H  . Chlorhexidine Gluconate Cloth  6 each Topical Q0600  . cycloSPORINE  1 drop Both Eyes BID  . docusate sodium  100 mg Oral BID  . finasteride  5 mg Oral Daily  . gatifloxacin  1 drop Left Eye TID  . lisinopril  20 mg Oral q morning - 10a  . mupirocin  ointment  1 application Nasal BID  . pantoprazole (PROTONIX) IV  40 mg Intravenous Q12H  . polyvinyl alcohol  1 drop Both Eyes QID  . senna  1 tablet Oral BID  . sodium chloride  3 mL Intravenous Q12H  . Tamsulosin HCl  0.4 mg Oral q morning - 10a  . DISCONTD:  ceFAZolin (ANCEF) IV  1 g Intravenous Q8H   Continuous Infusions:   . sodium chloride Stopped (10/25/11 0558)  . sodium chloride    . sodium chloride      Active Problems:  Parkinsonism  Hypertension  Acute renal failure  Anemia  Fall  Hyponatremia    Time spent: > 30 minutes    Penny Pia  [facility] Pager [phone]. If 8PM-8AM, please contact night-coverage at [email or web address], password [facility] 10/25/2011, 8:34 PM  LOS: 1 day

## 2011-10-26 ENCOUNTER — Encounter (HOSPITAL_COMMUNITY): Payer: Self-pay | Admitting: Gastroenterology

## 2011-10-26 DIAGNOSIS — K922 Gastrointestinal hemorrhage, unspecified: Secondary | ICD-10-CM | POA: Diagnosis not present

## 2011-10-26 DIAGNOSIS — K219 Gastro-esophageal reflux disease without esophagitis: Secondary | ICD-10-CM

## 2011-10-26 DIAGNOSIS — N200 Calculus of kidney: Secondary | ICD-10-CM

## 2011-10-26 DIAGNOSIS — S6980XA Other specified injuries of unspecified wrist, hand and finger(s), initial encounter: Secondary | ICD-10-CM | POA: Diagnosis not present

## 2011-10-26 DIAGNOSIS — K921 Melena: Secondary | ICD-10-CM | POA: Diagnosis not present

## 2011-10-26 DIAGNOSIS — D509 Iron deficiency anemia, unspecified: Secondary | ICD-10-CM | POA: Diagnosis not present

## 2011-10-26 DIAGNOSIS — D649 Anemia, unspecified: Secondary | ICD-10-CM | POA: Diagnosis not present

## 2011-10-26 LAB — CBC
Hemoglobin: 10.2 g/dL — ABNORMAL LOW (ref 13.0–17.0)
MCH: 30.4 pg (ref 26.0–34.0)
MCHC: 34.6 g/dL (ref 30.0–36.0)
MCV: 87.8 fL (ref 78.0–100.0)
RBC: 3.36 MIL/uL — ABNORMAL LOW (ref 4.22–5.81)

## 2011-10-26 LAB — BASIC METABOLIC PANEL
BUN: 23 mg/dL (ref 6–23)
CO2: 20 mEq/L (ref 19–32)
GFR calc non Af Amer: 31 mL/min — ABNORMAL LOW (ref >90)
Glucose, Bld: 86 mg/dL (ref 70–99)
Potassium: 3.9 mEq/L (ref 3.5–5.1)
Sodium: 134 mEq/L — ABNORMAL LOW (ref 135–145)

## 2011-10-26 LAB — SODIUM
Sodium: 131 mEq/L — ABNORMAL LOW (ref 135–145)
Sodium: 131 mEq/L — ABNORMAL LOW (ref 135–145)

## 2011-10-26 MED ORDER — POLYETHYLENE GLYCOL 3350 17 G PO PACK
17.0000 g | PACK | Freq: Three times a day (TID) | ORAL | Status: AC
  Administered 2011-10-26: 17 g via ORAL
  Filled 2011-10-26: qty 1

## 2011-10-26 NOTE — Progress Notes (Signed)
[facility] PROGRESS NOTE  Vincent Potts WUJ:[id number] DOB: 1925/01/06 DOA: 10/24/2011 PCP: Kimber Relic, MD  Assessment/Plan: Active Problems:  Parkinsonism  Hypertension  Acute renal failure  Anemia  Fall  Hyponatremia  Nephrolithiasis  1. Anemia: - GI on board.  Patient for endoscopy 8/26.  Will follow up with their results and recommendations.  Reportedly family wants the least invasive evaluation.  Patient to be started 8/27 on miralax and will have barium enema to rule out a gross colonic lesion.  2. Nephrolithiasis - repeat U/S in 6 months - Found to be nonobstructing.  Will have patient follow up with Urology as outpatient.  3. ARF - Creatinine currently trending down.  U/S shows non obstructive nephrolithiasis.  Will continue with gentle fluid rehydration and reassess.  Suspecting mainly prerenal causes given history.  4. Hyponatremia - Improving on IVF's.  Likely secondary to poor oral solute intake.  As such will continue gentle fluid hydration overnight and reassess next am.  5. Fall - May have been secondary to symptomatic anemia, parkinsons, and hyponatremia.  At this point would continue fall precautions. - Ortho following and we will f/u with their recommendations.  Pt is s/p left pinky amputation.   Code Status: DNR Family Communication: None at bedside Disposition Plan: Pending current work up and GI recommendations.   Brief narrative: Please see HPI  Consultants:  GI  Orthopaedics  Procedures:  Left pinky partial amputation  Antibiotics:  cefazolin  HPI/Subjective: No acute issues overnight.  No new complaints.  Objective: Filed Vitals:   10/26/11 0930 10/26/11 1059 10/26/11 1300 10/26/11 1400  BP: 135/82 135/82  132/78  Pulse: 105   50  Temp:    98 F (36.7 C)  TempSrc:    Oral  Resp:   16 18  Height:      Weight:      SpO2: 95%   96%    Intake/Output Summary (Last 24 hours) at 10/26/11 1647 Last data filed at  10/26/11 1300  Gross per 24 hour  Intake    600 ml  Output    550 ml  Net     50 ml   Filed Weights   10/24/11 0906 10/24/11 2300 10/25/11 2054  Weight: 59.3 kg (130 lb 11.7 oz) 59.9 kg (132 lb 0.9 oz) 59 kg (130 lb 1.1 oz)    Exam:   General:  Pt in NAD, Alert and Awake  Cardiovascular: RRR, No MRG  Respiratory: CTA Bl, no wheezes  Abdomen: Soft, NT, ND  Data Reviewed: Basic Metabolic Panel:  Lab 10/26/11 9562 10/26/11 0823 10/26/11 0109 10/25/11 1830 10/25/11 1210 10/25/11 0142 10/24/11 0835 10/24/11 0121  NA 131* 134* 131* 131* 130*865* -- -- --  K -- 3.9 -- -- 4.1 4.3 3.9 3.8  CL -- 104 -- -- 102 99 97 94*  CO2 -- 20 -- -- 18* 19 17* 22  GLUCOSE -- 86 -- -- 87 87 117* 96  BUN -- 23 -- -- 26* 28* 30* 33*  CREATININE -- 1.88* -- -- 2.04* 2.07* 1.95* 2.26*  CALCIUM -- 8.5 -- -- 8.7 8.6 8.9 8.8  MG -- -- -- -- -- 2.0 -- --  PHOS -- -- -- -- -- 3.1 -- --   Liver Function Tests:  Lab 10/24/11 0835 10/24/11 0121  AST 15 9  ALT 7 6  ALKPHOS 60 53  BILITOT 0.1* 0.1*  PROT 6.3 5.8*  ALBUMIN 2.8* 2.7*   No results found for this basename:  LIPASE:5,AMYLASE:5 in the last 168 hours No results found for this basename: AMMONIA:5 in the last 168 hours CBC:  Lab 10/26/11 0823 10/25/11 0142 10/24/11 2230 10/24/11 1640 10/24/11 0835 10/24/11 0121  WBC 7.5 6.5 6.9 5.8 7.0 --  NEUTROABS -- -- -- -- -- 4.6  HGB 10.2* 9.6* 9.7* 9.8* 9.0* --  HCT 29.5* 27.6* 27.6* 28.6* 26.6* --  MCV 87.8 85.7 85.7 86.9 87.8 --  PLT 244 228 247 260 281 --   Cardiac Enzymes: No results found for this basename: CKTOTAL:5,CKMB:5,CKMBINDEX:5,TROPONINI:5 in the last 168 hours BNP (last 3 results) No results found for this basename: PROBNP:3 in the last 8760 hours CBG: No results found for this basename: GLUCAP:5 in the last 168 hours  Recent Results (from the past 240 hour(s))  MRSA PCR SCREENING     Status: Abnormal   Collection Time   10/24/11  6:43 AM      Component Value Range Status  Comment   MRSA by PCR POSITIVE (*) NEGATIVE Final      Studies: Ct Head Wo Contrast  10/24/2011  *RADIOLOGY REPORT*  Clinical Data: Fall, scalp hematoma.  CT HEAD WITHOUT CONTRAST  Technique:  Contiguous axial images were obtained from the base of the skull through the vertex without contrast.  Comparison: 04/18/2011  Findings: Prominence of the sulci, cisterns, and ventricles, in keeping with volume loss. There are subcortical and periventricular white matter hypodensities, a nonspecific finding most often seen with chronic microangiopathic changes.  There is no evidence for acute hemorrhage, overt hydrocephalus, mass lesion, or abnormal extra-axial fluid collection.  No definite CT evidence for acute cortical based (large artery) infarction. Prior left caudate head lacunar infarction, unchanged underpneumatized left mastoid air cells.  Paranasal sinuses and mastoid air cells are otherwise predominately clear. Atherosclerotic vascular calcifications.  IMPRESSION: Volume loss, white matter changes, and remote left caudate lacunar infarction.  No acute intracranial abnormality identified.   Original Report Authenticated By: Waneta Martins, M.D.    US Renal  10/24/2011  *RADIOLOGY REPORT*  Clinical Data: Chronic kidney disease  RENAL/URINARY TRACT ULTRASOUND COMPLETE  Comparison:  None.  Findings:  Right Kidney:  Measures 9.0 cm. Echogenic renal parenchyma, likely reflecting medical renal disease.  1.5 x 1.3 x 1.7 cm lower pole cyst.  Additional 9 x 7 by 8 mm hypoechoic lower pole lesion, likely reflecting a cyst, although not simple by ultrasound.  9 mm calculus in the interpolar region.  No hydronephrosis.  Left Kidney:  Measures 9.4 cm.  Echogenic renal parenchyma, likely reflecting medical renal disease.  12 mm calculus in the interpolar region.  Bladder:  Poorly visualized/decompressed.  Additional comments:  Suspected small right pleural effusion.  IMPRESSION: Small bilateral kidneys with echogenic  renal parenchyma, likely reflecting medical renal disease.  12 mm nonobstructing left renal calculus.  9 mm nonobstructing right renal calculus.  No hydronephrosis.  9 mm probable left lower pole cyst, although not simple by ultrasound.  Given small size, consider follow-up ultrasound in 6 months.  MR abdomen could also be considered, although patient would only be eligible for (half-dose) contrast if GFR remains > 30.   Original Report Authenticated By: Charline Bills, M.D.    Dg Chest Port 1 View  10/24/2011  *RADIOLOGY REPORT*  Clinical Data: Fall  PORTABLE CHEST - 1 VIEW  Comparison: 04/18/2011  Findings: Prominent cardiomediastinal contours.  Bibasilar opacities.  Senescent changes interstitial prominence.  Osteopenia. Prior posterior and lateral right side rib fractures.  No definite interval osseous change.  IMPRESSION: Prominent cardiomediastinal contours, suggests aortic tortuosity and or dilatation. Consider follow-up PA and lateral radiographs when the patient can tolerate.  Bibasilar opacities; atelectasis versus infiltrate.   Original Report Authenticated By: Waneta Martins, M.D.    Dg Hand Complete Left  10/24/2011  *RADIOLOGY REPORT*  Clinical Data: 78 year old male status post fall, laceration to the left hand.  LEFT HAND - COMPLETE 3+ VIEW  Comparison: None.  Findings: Traumatic amputation of the tuft of the left fifth distal phalanx.  Associated soft tissue injury.  The middle and proximal fifth phalanges are intact.  Osteopenia. Diffuse changes of osteoarthritis in the interphalangeal joint. Distal radius and ulna appear intact.  Carpal bone alignment within normal limits.  IMPRESSION: 1.  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 2.  No other acute fracture or dislocation.  Diffuse osteoarthritis.   Original Report Authenticated By: Harley Hallmark, M.D.     Scheduled Meds:    . acetaminophen  500 mg Oral QHS  .  ceFAZolin (ANCEF) IV  1 g Intravenous  Q12H  . Chlorhexidine Gluconate Cloth  6 each Topical Q0600  . cycloSPORINE  1 drop Both Eyes BID  . docusate sodium  100 mg Oral BID  . finasteride  5 mg Oral Daily  . gatifloxacin  1 drop Left Eye TID  . lisinopril  20 mg Oral q morning - 10a  . mupirocin ointment  1 application Nasal BID  . pantoprazole (PROTONIX) IV  40 mg Intravenous Q12H  . polyethylene glycol  17 g Oral TID  . polyvinyl alcohol  1 drop Both Eyes QID  . senna  1 tablet Oral BID  . sodium chloride  3 mL Intravenous Q12H  . Tamsulosin HCl  0.4 mg Oral q morning - 10a  . DISCONTD: sodium chloride  500 mL Intravenous Once   Continuous Infusions:    . sodium chloride 50 mL/hr at 10/26/11 1549  . DISCONTD: sodium chloride Stopped (10/25/11 0558)  . DISCONTD: sodium chloride    . DISCONTD: sodium chloride      Active Problems:  Parkinsonism  Hypertension  Acute renal failure  Anemia  Fall  Hyponatremia  Nephrolithiasis    Time spent: > 30 minutes    Penny Pia  [facility] Pager [phone]. If 8PM-8AM, please contact night-coverage at [email or web address], password [facility] 10/26/2011, 4:47 PM  LOS: 2 days

## 2011-10-26 NOTE — Progress Notes (Signed)
Subjective: Patient doing well in regards to hand, no complaints. POA, Vincent Potts is visiting.   Objective: Vital signs in last 24 hours: Temp:  [97.9 F (36.6 C)-98 F (36.7 C)] 98 F (36.7 C) (08/27 1400) Pulse Rate:  [49-105] 50  (08/27 1400) Resp:  [16-18] 18  (08/27 1400) BP: (132-155)/(77-82) 132/78 mmHg (08/27 1400) SpO2:  [95 %-99 %] 96 % (08/27 1400)  Intake/Output from previous day: 08/26 0701 - 08/27 0700 In: 360 [P.O.:120; I.V.:240] Out: 655 [Urine:655] Intake/Output this shift: Total I/O In: -  Out: 650 [Urine:650]   Basename 10/26/11 0823 10/25/11 0142 10/24/11 2230 10/24/11 1640 10/24/11 0835  HGB 10.2* 9.6* 9.7* 9.8* 9.0*    Basename 10/26/11 0823 10/25/11 0142  WBC 7.5 6.5  RBC 3.36* 3.22*  HCT 29.5* 27.6*  PLT 244 228    Basename 10/26/11 2000 10/26/11 1243 10/26/11 0823 10/25/11 1210  NA 128* 131* -- --  K -- -- 3.9 4.1  CL -- -- 104 102  CO2 -- -- 20 18*  BUN -- -- 23 26*  CREATININE -- -- 1.88* 2.04*  GLUCOSE -- -- 86 87  CALCIUM -- -- 8.5 8.7    Basename 10/24/11 0121  LABPT --  INR 1.25    Pleasant, NAD, cooperative LUE: dressings are clean dry and intact, no signs of infection or ascending cellulitis, exposed digits with excellent rom, neurovascularly intact  Assessment/Plan: S/P Left small finger revision amputation: stable  .Marland Kitchen 1. Fall   2. Closed head injury   3. Finger injury   4. Anemia   5. GI bleed   6. Hyponatremia   7. Renal insufficiency   8. Acute renal failure   9. Altered mental status   10. Hypertension   11. GERD (gastroesophageal reflux disease)   12. Nephrolithiasis     We will cont to follow for any hand issues that arise, currently patient doing well in regards to the upper extremity. We have discussed with his Vincent Potts at length, wound care and follow up in 10 to 12 days for dressing change and wound check, during the interim he is to keep the dressings clean and dry and not remove them. Vincent Potts  L 10/26/2011, 9:03 PM

## 2011-10-26 NOTE — Progress Notes (Signed)
[facility] PROGRESS NOTE Subjective Patient with no ill effects after the EGD. No source of his anemia or heme positive stools noted. Long discussion with his nephew Tasia Catchings who is his POA. Patient's colonoscopy was done somewhere at least 6 years ago or possibly more. The patient has a multitude of health problems and Tasia Catchings indicated that the family would like to do the least invasive evaluation of his GI tract possible. Long discussion about simply following his stool and doing no evaluation of the colon, full colonoscopy with all the risk and benefits associated with that, or barium enema to rule out colon cancer that could cause colonic obstruction and would need intervention. Tasia Catchings felt that barium enema at this time would be most consistent with the family and patient wishes.  Objective: Vital signs in last 24 hours: Temp:  [97.9 F (36.6 C)-98.6 F (37 C)] 97.9 F (36.6 C) (08/27 0527) Pulse Rate:  [48-57] 49  (08/27 0527) Resp:  [10-59] 18  (08/27 0527) BP: (118-164)/(57-101) 155/77 mmHg (08/27 0527) SpO2:  [93 %-100 %] 99 % (08/27 0527) Weight:  [59 kg (130 lb 1.1 oz)] 59 kg (130 lb 1.1 oz) (08/26 2054) Last BM Date:  (unknown)  Intake/Output from previous day: 08/26 0701 - 08/27 0700 In: 360 [P.O.:120; I.V.:240] Out: 655 [Urine:655] Intake/Output this shift:    PE: Gen.-patient in no distress. Abdomen-somewhat distended but soft and nontender with good bowel sounds.  Lab Results:  Basename 10/25/11 0142 10/24/11 2230 10/24/11 1640 10/24/11 0835 10/24/11 0121  WBC 6.5 6.9 5.8 7.0 7.6  HGB 9.6* 9.7* 9.8* 9.0* 7.6*  HCT 27.6* 27.6* 28.6* 26.6* 22.2*  PLT 228 247 260 281 242   BMET  Basename 10/26/11 0109 10/25/11 1830 10/25/11 1210 10/25/11 0814 10/25/11 0142 10/24/11 0835 10/24/11 0121  NA 131* 131* 133*132* 131* 128* -- --  K -- -- 4.1 -- 4.3 3.9 3.8  CL -- -- 102 -- 99 97 94*  CO2 -- -- 18* -- 19 17* 22  CREATININE -- -- 2.04* -- 2.07* 1.95* 2.26*    LFT  Basename 10/24/11 0835 10/24/11 0121  PROT 6.3 5.8*  AST 15 9  ALT 7 6  ALKPHOS 60 53  BILITOT 0.1* 0.1*  BILIDIR -- --  IBILI -- --   PT/INR  Basename 10/24/11 0121  LABPROT 16.0*  INR 1.25   PANCREAS No results found for this basename: LIPASE:3 in the last 72 hours       Studies/Results: US Renal  10/24/2011  *RADIOLOGY REPORT*  Clinical Data: Chronic kidney disease  RENAL/URINARY TRACT ULTRASOUND COMPLETE  Comparison:  None.  Findings:  Right Kidney:  Measures 9.0 cm. Echogenic renal parenchyma, likely reflecting medical renal disease.  1.5 x 1.3 x 1.7 cm lower pole cyst.  Additional 9 x 7 by 8 mm hypoechoic lower pole lesion, likely reflecting a cyst, although not simple by ultrasound.  9 mm calculus in the interpolar region.  No hydronephrosis.  Left Kidney:  Measures 9.4 cm.  Echogenic renal parenchyma, likely reflecting medical renal disease.  12 mm calculus in the interpolar region.  Bladder:  Poorly visualized/decompressed.  Additional comments:  Suspected small right pleural effusion.  IMPRESSION: Small bilateral kidneys with echogenic renal parenchyma, likely reflecting medical renal disease.  12 mm nonobstructing left renal calculus.  9 mm nonobstructing right renal calculus.  No hydronephrosis.  9 mm probable left lower pole cyst, although not simple by ultrasound.  Given small size, consider follow-up ultrasound in 6 months.  MR  abdomen could also be considered, although patient would only be eligible for (half-dose) contrast if GFR remains > 30.   Original Report Authenticated By: Charline Bills, M.D.     Medications: I have reviewed the patient's current medications.  Assessment/Plan: 1. Anemia and Hemoccult-positive stools. EGD negative. Patient should have some sort of colon evaluation to rule out a near obstructing colon tumor. GI AVMs likely possibility. Family desires least invasive evaluation. We will therefore go ahead and start him on MiraLAX and  obtain a barium enema to rule out a gross colonic lesion.   Lariza Cothron JR,Juanantonio L 10/26/2011, 7:17 AM

## 2011-10-26 NOTE — Progress Notes (Signed)
Physical Therapy Evaluation Patient Details Name: Vincent Potts MRN: [id number] DOB: 11-09-1924 Today's Date: 10/26/2011 Time: 4098-1191 PT Time Calculation (min): 35 min  PT Assessment / Plan / Recommendation Clinical Impression  78 yo male admitted with fall, anemia, heme + stool, s/p L hand little finger amputation rev presents with decr independence and sfety with mobility; will benefit from acute PT to maximize independence and safety with mobility/ amb , and to faciliatae dc planning;   It is likely that as pt stabilizes medically that he will progress well with functional mobility and be able to dc back to [facility] with HHPT follow-up;   Will need a clearer picture of how much assist is available to Vincent Potts at ALF level; if necessary, will consider SNF level    PT Assessment  Patient needs continued PT services    Follow Up Recommendations  Home health PT;Supervision/Assistance - 24 hour (at ALF)    Barriers to Discharge   Need mroe info re: how much assist is available at [facility]F level of care    Equipment Recommendations  None recommended by PT    Recommendations for Other Services OT consult   Frequency Min 3X/week    Precautions / Restrictions Precautions Precautions: Fall Restrictions Weight Bearing Restrictions: No Other Position/Activity Restrictions: Emphasize WBing through L palm and thenar eminence   Pertinent Vitals/Pain Denies pain See doc flowsheets for Orthostatic BPs; Of note, there was an approximat 20 mmHg drop from supine to sitting, pt was asymptomatic for dizziness      Mobility  Bed Mobility Bed Mobility: Supine to Sit;Sitting - Scoot to Edge of Bed Supine to Sit: 3: Mod assist;HOB elevated (slightly elevated) Sitting - Scoot to Edge of Bed: 4: Min assist;With rail Details for Bed Mobility Assistance: Cues for technique and to self-monitor for dizziness during activity; Used bed pad to assist hips to square off at  EOB Transfers Transfers: Sit to Stand;Stand to Sit Sit to Stand: 4: Min assist;With upper extremity assist;From bed Stand to Sit: 4: Min assist;With armrests;To chair/3-in-1 Details for Transfer Assistance: Pt with adequate LE strength for rise with sit to stand, but tends to lean slightly posteriorly, bracing backs of LEs against surface Ambulation/Gait Ambulation/Gait Assistance: 4: Min guard Ambulation Distance (Feet): 4 Feet (bed to [facility] to recliner) Assistive device: Rolling walker Ambulation/Gait Assistance Details: Short, shuffled steps to left side; trunk flexed; Cues for self-monitor for activity tol; Able to WB appropriately through palm and keep little finger unweighed Gait Pattern: Shuffle;Trunk flexed    Exercises     PT Diagnosis: Difficulty walking  PT Problem List: Decreased activity tolerance;Decreased balance;Decreased mobility;Decreased coordination;Decreased knowledge of use of DME;Decreased knowledge of precautions PT Treatment Interventions: DME instruction;Gait training;Functional mobility training;Therapeutic activities;Therapeutic exercise;Balance training;Patient/family education   PT Goals Acute Rehab PT Goals PT Goal Formulation: With patient Time For Goal Achievement: 11/09/11 Potential to Achieve Goals: Good Pt will go Supine/Side to Sit: with modified independence PT Goal: Supine/Side to Sit - Progress: Goal set today Pt will go Sit to Supine/Side: with modified independence PT Goal: Sit to Supine/Side - Progress: Goal set today Pt will go Sit to Stand: with modified independence PT Goal: Sit to Stand - Progress: Goal set today Pt will go Stand to Sit: with modified independence PT Goal: Stand to Sit - Progress: Goal set today Pt will Ambulate: >150 feet;with modified independence;with rolling walker (rollator) PT Goal: Ambulate - Progress: Goal set today  Visit Information  Last PT Received  On: 10/26/11 Assistance Needed: +1    Subjective Data   Subjective: I worried about dizziness Patient Stated Goal: get better   Prior Functioning  Home Living Lives With: Other (Comment) ([facility]) Available Help at Discharge: Personal care attendant;Available PRN/intermittently (ALF) Type of Home: Assisted living Home Access: Level entry Home Layout: One level Bathroom Shower/Tub: Heritage manager Accessibility: Yes How Accessible: Accessible via walker Home Adaptive Equipment: Walker - four wheeled;Shower chair with back (May have more equipment available to him; Will need to clarify) Prior Function Level of Independence: Needs assistance Needs Assistance: Bathing;Meal Prep;Light Housekeeping;Gait Bath: Moderate Meal Prep: Total Light Housekeeping: Total Gait Assistance: Supervision with rollator RW, walks to dining hall Driving: No Communication Communication: HOH Dominant Hand: Right    Cognition  Overall Cognitive Status: Appears within functional limits for tasks assessed/performed (for simple mobility tasks) Arousal/Alertness: Awake/alert Orientation Level: Appears intact for tasks assessed Behavior During Session: [facility] for tasks performed    Extremity/Trunk Assessment Right Upper Extremity Assessment RUE ROM/Strength/Tone: [facility] for tasks assessed Left Upper Extremity Assessment LUE ROM/Strength/Tone: Deficits LUE ROM/Strength/Tone Deficits: L small finger revision of amputation Right Lower Extremity Assessment RLE ROM/Strength/Tone: WFL for tasks assessed RLE Coordination: Deficits RLE Coordination Deficits: dependent on bracing backs of LEs against bed or chair for sit to stand  Left Lower Extremity Assessment LLE ROM/Strength/Tone: [facility] for tasks assessed LLE Coordination: Deficits LLE Coordination Deficits: dependent on bracing backs of LEs against bed or chair for sit to stand    Balance    End of Session PT - End of Session Activity Tolerance: Patient tolerated treatment well Patient left: in  chair;with nursing in room Nurse Communication: Mobility status  GP     Vincent Potts Vincent Potts, [place] [phone]  10/26/2011, 12:05 PM

## 2011-10-27 ENCOUNTER — Inpatient Hospital Stay (HOSPITAL_COMMUNITY): Payer: Medicare Other

## 2011-10-27 DIAGNOSIS — N200 Calculus of kidney: Secondary | ICD-10-CM | POA: Diagnosis not present

## 2011-10-27 DIAGNOSIS — R7989 Other specified abnormal findings of blood chemistry: Secondary | ICD-10-CM

## 2011-10-27 DIAGNOSIS — K921 Melena: Secondary | ICD-10-CM | POA: Diagnosis not present

## 2011-10-27 DIAGNOSIS — D509 Iron deficiency anemia, unspecified: Secondary | ICD-10-CM | POA: Diagnosis not present

## 2011-10-27 DIAGNOSIS — N179 Acute kidney failure, unspecified: Secondary | ICD-10-CM | POA: Diagnosis not present

## 2011-10-27 DIAGNOSIS — D649 Anemia, unspecified: Secondary | ICD-10-CM | POA: Diagnosis not present

## 2011-10-27 LAB — BASIC METABOLIC PANEL
BUN: 19 mg/dL (ref 6–23)
CO2: 19 mEq/L (ref 19–32)
CO2: 21 mEq/L (ref 19–32)
Calcium: 8.7 mg/dL (ref 8.4–10.5)
Chloride: 102 mEq/L (ref 96–112)
Chloride: 98 mEq/L (ref 96–112)
Glucose, Bld: 84 mg/dL (ref 70–99)
Glucose, Bld: 120 mg/dL — ABNORMAL HIGH (ref 70–99)
Potassium: 4.1 mEq/L (ref 3.5–5.1)
Potassium: 3.7 mEq/L (ref 3.5–5.1)
Sodium: 132 mEq/L — ABNORMAL LOW (ref 135–145)
Sodium: 128 mEq/L — ABNORMAL LOW (ref 135–145)

## 2011-10-27 LAB — SODIUM: Sodium: 129 mEq/L — ABNORMAL LOW (ref 135–145)

## 2011-10-27 MED ORDER — POLYETHYLENE GLYCOL 3350 17 GM/SCOOP PO POWD
1.0000 | Freq: Once | ORAL | Status: AC
  Administered 2011-10-27: 1 via ORAL
  Filled 2011-10-27: qty 255

## 2011-10-27 NOTE — Progress Notes (Signed)
[facility] PROGRESS NOTE  Vincent Potts AVW:[id number] DOB: 02/03/1925 DOA: 10/24/2011 PCP: Kimber Relic, MD  Assessment/Plan: Active Problems:  Parkinsonism  Hypertension  Acute renal failure  Anemia  Fall  Hyponatremia  Nephrolithiasis  1. Anemia: - GI on board.  Patient for endoscopy 8/26.  Will follow up with their results and recommendations.  Reportedly family wants the least invasive evaluation.  Patient to be started 8/27 on miralax and will have barium enema tomorrow 8/29 to rule out a gross colonic lesion.  Will touch base with GI tomorrow after procedure to see disposition from their standpoint.  2. Nephrolithiasis - repeat U/S in 6 months - Found to be nonobstructing.  Will have patient follow up with Urology as outpatient.  3. ARF - Creatinine currently trending down.  U/S shows non obstructive nephrolithiasis.  Will continue with gentle fluid rehydration and reassess.  Suspecting mainly prerenal causes given history.  4. Hyponatremia - Improving on IVF's.  Likely secondary to poor oral solute intake.  As such will continue gentle fluid hydration. Likely saline lock tomorrow. Sodium levels have been steady around 130.  With his last being 129  5. Fall - May have been secondary to symptomatic anemia, parkinsons, and hyponatremia.  At this point would continue fall precautions. - Ortho following and we will f/u with their recommendations.  Pt is s/p left pinky amputation.   Code Status: DNR Family Communication: None at bedside Disposition Plan: Pending current work up and GI recommendations.   Brief narrative: Please see HPI  Consultants:  GI  Orthopaedics  Procedures:  Left pinky partial amputation  Antibiotics:  cefazolin  HPI/Subjective: No acute issues overnight.  No new complaints.  Objective: Filed Vitals:   10/27/11 1000 10/27/11 1400 10/27/11 1800 10/27/11 2006  BP: 154/72 135/85 137/82 120/72  Pulse: 53 64 75 72  Temp: 98.7  F (37.1 C) 97.6 F (36.4 C) 98.3 F (36.8 C) 97.6 F (36.4 C)  TempSrc: Oral Oral Oral Oral  Resp: 20 16 16 18   Height:    5\' 7"  (1.702 m)  Weight:    60.737 kg (133 lb 14.4 oz)  SpO2: 98% 99% 96% 98%    Intake/Output Summary (Last 24 hours) at 10/27/11 2024 Last data filed at 10/27/11 2005  Gross per 24 hour  Intake 1720.83 ml  Output   3326 ml  Net -1605.17 ml   Filed Weights   10/25/11 2054 10/26/11 2123 10/27/11 2006  Weight: 59 kg (130 lb 1.1 oz) 60.419 kg (133 lb 3.2 oz) 60.737 kg (133 lb 14.4 oz)    Exam:   General:  Pt in NAD, Alert and Awake  Cardiovascular: RRR, No MRG  Respiratory: CTA Bl, no wheezes  Abdomen: Soft, NT, ND  Data Reviewed: Basic Metabolic Panel:  Lab 10/27/11 8295 10/27/11 1025 10/27/11 0530 10/27/11 0036 10/26/11 2000 10/26/11 0823 10/25/11 1210 10/25/11 0142 10/24/11 0835  NA 129* 130* 132* 126* 128* -- -- -- --  K -- -- 4.1 -- -- 3.9 4.1 4.3 3.9  CL -- -- 102 -- -- 104 102 99 97  CO2 -- -- 19 -- -- 20 18* 19 17*  GLUCOSE -- -- 84 -- -- 86 87 87 117*  BUN -- -- 19 -- -- 23 26* 28* 30*  CREATININE -- -- 1.72* -- -- 1.88* 2.04* 2.07* 1.95*  CALCIUM -- -- 8.6 -- -- 8.5 8.7 8.6 8.9  MG -- -- -- -- -- -- -- 2.0 --  PHOS -- -- -- -- -- -- --  3.1 --   Liver Function Tests:  Lab 10/24/11 0835 10/24/11 0121  AST 15 9  ALT 7 6  ALKPHOS 60 53  BILITOT 0.1* 0.1*  PROT 6.3 5.8*  ALBUMIN 2.8* 2.7*   No results found for this basename: LIPASE:5,AMYLASE:5 in the last 168 hours No results found for this basename: AMMONIA:5 in the last 168 hours CBC:  Lab 10/26/11 0823 10/25/11 0142 10/24/11 2230 10/24/11 1640 10/24/11 0835 10/24/11 0121  WBC 7.5 6.5 6.9 5.8 7.0 --  NEUTROABS -- -- -- -- -- 4.6  HGB 10.2* 9.6* 9.7* 9.8* 9.0* --  HCT 29.5* 27.6* 27.6* 28.6* 26.6* --  MCV 87.8 85.7 85.7 86.9 87.8 --  PLT 244 228 247 260 281 --   Cardiac Enzymes: No results found for this basename: CKTOTAL:5,CKMB:5,CKMBINDEX:5,TROPONINI:5 in the last  168 hours BNP (last 3 results) No results found for this basename: PROBNP:3 in the last 8760 hours CBG: No results found for this basename: GLUCAP:5 in the last 168 hours  Recent Results (from the past 240 hour(s))  MRSA PCR SCREENING     Status: Abnormal   Collection Time   10/24/11  6:43 AM      Component Value Range Status Comment   MRSA by PCR POSITIVE (*) NEGATIVE Final      Studies: Ct Head Wo Contrast  10/24/2011  *RADIOLOGY REPORT*  Clinical Data: Fall, scalp hematoma.  CT HEAD WITHOUT CONTRAST  Technique:  Contiguous axial images were obtained from the base of the skull through the vertex without contrast.  Comparison: 04/18/2011  Findings: Prominence of the sulci, cisterns, and ventricles, in keeping with volume loss. There are subcortical and periventricular white matter hypodensities, a nonspecific finding most often seen with chronic microangiopathic changes.  There is no evidence for acute hemorrhage, overt hydrocephalus, mass lesion, or abnormal extra-axial fluid collection.  No definite CT evidence for acute cortical based (large artery) infarction. Prior left caudate head lacunar infarction, unchanged underpneumatized left mastoid air cells.  Paranasal sinuses and mastoid air cells are otherwise predominately clear. Atherosclerotic vascular calcifications.  IMPRESSION: Volume loss, white matter changes, and remote left caudate lacunar infarction.  No acute intracranial abnormality identified.   Original Report Authenticated By: Waneta Martins, M.D.    US Renal  10/24/2011  *RADIOLOGY REPORT*  Clinical Data: Chronic kidney disease  RENAL/URINARY TRACT ULTRASOUND COMPLETE  Comparison:  None.  Findings:  Right Kidney:  Measures 9.0 cm. Echogenic renal parenchyma, likely reflecting medical renal disease.  1.5 x 1.3 x 1.7 cm lower pole cyst.  Additional 9 x 7 by 8 mm hypoechoic lower pole lesion, likely reflecting a cyst, although not simple by ultrasound.  9 mm calculus in the  interpolar region.  No hydronephrosis.  Left Kidney:  Measures 9.4 cm.  Echogenic renal parenchyma, likely reflecting medical renal disease.  12 mm calculus in the interpolar region.  Bladder:  Poorly visualized/decompressed.  Additional comments:  Suspected small right pleural effusion.  IMPRESSION: Small bilateral kidneys with echogenic renal parenchyma, likely reflecting medical renal disease.  12 mm nonobstructing left renal calculus.  9 mm nonobstructing right renal calculus.  No hydronephrosis.  9 mm probable left lower pole cyst, although not simple by ultrasound.  Given small size, consider follow-up ultrasound in 6 months.  MR abdomen could also be considered, although patient would only be eligible for (half-dose) contrast if GFR remains > 30.   Original Report Authenticated By: Charline Bills, M.D.    Dg Chest Port 1 View  10/24/2011  *  RADIOLOGY REPORT*  Clinical Data: Fall  PORTABLE CHEST - 1 VIEW  Comparison: 04/18/2011  Findings: Prominent cardiomediastinal contours.  Bibasilar opacities.  Senescent changes interstitial prominence.  Osteopenia. Prior posterior and lateral right side rib fractures.  No definite interval osseous change.  IMPRESSION: Prominent cardiomediastinal contours, suggests aortic tortuosity and or dilatation. Consider follow-up PA and lateral radiographs when the patient can tolerate.  Bibasilar opacities; atelectasis versus infiltrate.   Original Report Authenticated By: Waneta Martins, M.D.    Dg Hand Complete Left  10/24/2011  *RADIOLOGY REPORT*  Clinical Data: 78 year old male status post fall, laceration to the left hand.  LEFT HAND - COMPLETE 3+ VIEW  Comparison: None.  Findings: Traumatic amputation of the tuft of the left fifth distal phalanx.  Associated soft tissue injury.  The middle and proximal fifth phalanges are intact.  Osteopenia. Diffuse changes of osteoarthritis in the interphalangeal joint. Distal radius and ulna appear intact.  Carpal bone alignment  within normal limits.  IMPRESSION: 1.  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 2.  No other acute fracture or dislocation.  Diffuse osteoarthritis.   Original Report Authenticated By: Harley Hallmark, M.D.     Scheduled Meds:    . acetaminophen  500 mg Oral QHS  .  ceFAZolin (ANCEF) IV  1 g Intravenous Q12H  . Chlorhexidine Gluconate Cloth  6 each Topical Q0600  . cycloSPORINE  1 drop Both Eyes BID  . docusate sodium  100 mg Oral BID  . finasteride  5 mg Oral Daily  . gatifloxacin  1 drop Left Eye TID  . lisinopril  20 mg Oral q morning - 10a  . mupirocin ointment  1 application Nasal BID  . pantoprazole (PROTONIX) IV  40 mg Intravenous Q12H  . polyethylene glycol powder  1 Container Oral Once  . polyvinyl alcohol  1 drop Both Eyes QID  . senna  1 tablet Oral BID  . sodium chloride  3 mL Intravenous Q12H  . Tamsulosin HCl  0.4 mg Oral q morning - 10a   Continuous Infusions:    . sodium chloride 50 mL/hr at 10/27/11 0409    Active Problems:  Parkinsonism  Hypertension  Acute renal failure  Anemia  Fall  Hyponatremia  Nephrolithiasis    Time spent: > 30 minutes    Penny Pia  [facility] Pager [phone]. If 8PM-8AM, please contact night-coverage at [email or web address], password [facility] 10/27/2011, 8:24 PM  LOS: 3 days

## 2011-10-27 NOTE — Progress Notes (Signed)
[facility] PROGRESS NOTE Subjective No gross bleeding  BE not done today. Routine prep per radiology ordered but apparently wasn't communicated to staff reordered tomorrow.  Objective: Vital signs in last 24 hours: Temp:  [97.6 F (36.4 C)-98.7 F (37.1 C)] 97.6 F (36.4 C) (08/28 1400) Pulse Rate:  [53-70] 64  (08/28 1400) Resp:  [16-20] 16  (08/28 1400) BP: (135-179)/(72-95) 135/85 mmHg (08/28 1400) SpO2:  [94 %-99 %] 99 % (08/28 1400) Weight:  [60.419 kg (133 lb 3.2 oz)] 60.419 kg (133 lb 3.2 oz) (08/27 2123) Last BM Date: 10/26/11  Intake/Output from previous day: 08/27 0701 - 08/28 0700 In: 2046.3 [P.O.:720; I.V.:1226.3; IV Piggyback:100] Out: 2100 [Urine:2100] Intake/Output this shift: Total I/O In: 760 [P.O.:360; I.V.:400] Out: 475 [Urine:475]    Lab Results:  Basename 10/26/11 0823 10/25/11 0142 10/24/11 2230  WBC 7.5 6.5 6.9  HGB 10.2* 9.6* 9.7*  HCT 29.5* 27.6* 27.6*  PLT 244 228 247   BMET  Basename 10/27/11 1204 10/27/11 1025 10/27/11 0530 10/27/11 0036 10/26/11 2000 10/26/11 0823 10/25/11 1210 10/25/11 0142  NA 129* 130* 132* 126* 128* -- -- --  K -- -- 4.1 -- -- 3.9 4.1 4.3  CL -- -- 102 -- -- 104 102 99  CO2 -- -- 19 -- -- 20 18* 19  CREATININE -- -- 1.72* -- -- 1.88* 2.04* 2.07*   LFT No results found for this basename: PROT:3ALBUMIN:3,AST:3,ALT:3,ALKPHOS:3,BILITOT:3,BILIDIR:3,IBILI:3 in the last 72 hours PT/INR No results found for this basename: LABPROT:3,INR:3 in the last 72 hours PANCREAS No results found for this basename: LIPASE:3 in the last 72 hours       Studies/Results: No results found.  Medications: I have reviewed the patient's current medications.  Assessment/Plan: 1. Anemia/Pos stools. EGD negative, waiting for BE to r/o gross lesion of colon. Hg stable   Tish Begin JR,Fredick L 10/27/2011, 5:05 PM

## 2011-10-27 NOTE — Progress Notes (Signed)
Physical Therapy Note (Follow-up note post session yesterday)   Spoke with pt's nephew re: DC planning  Discussed current status, and got more info re: available level of assist at ALF level   Feel pt would benefit from short-term stay at SNF level of care at [facility] to maximize independence and safety with mobility and bridge back to ALF  [place], [place] [phone]

## 2011-10-28 ENCOUNTER — Inpatient Hospital Stay (HOSPITAL_COMMUNITY): Payer: Medicare Other

## 2011-10-28 DIAGNOSIS — M545 Low back pain: Secondary | ICD-10-CM | POA: Diagnosis not present

## 2011-10-28 DIAGNOSIS — G459 Transient cerebral ischemic attack, unspecified: Secondary | ICD-10-CM | POA: Diagnosis not present

## 2011-10-28 DIAGNOSIS — R269 Unspecified abnormalities of gait and mobility: Secondary | ICD-10-CM | POA: Diagnosis not present

## 2011-10-28 DIAGNOSIS — I679 Cerebrovascular disease, unspecified: Secondary | ICD-10-CM | POA: Diagnosis not present

## 2011-10-28 DIAGNOSIS — K5730 Diverticulosis of large intestine without perforation or abscess without bleeding: Secondary | ICD-10-CM

## 2011-10-28 DIAGNOSIS — R609 Edema, unspecified: Secondary | ICD-10-CM | POA: Diagnosis not present

## 2011-10-28 DIAGNOSIS — E871 Hypo-osmolality and hyponatremia: Secondary | ICD-10-CM | POA: Diagnosis not present

## 2011-10-28 DIAGNOSIS — IMO0002 Reserved for concepts with insufficient information to code with codable children: Secondary | ICD-10-CM | POA: Diagnosis not present

## 2011-10-28 DIAGNOSIS — R471 Dysarthria and anarthria: Secondary | ICD-10-CM | POA: Diagnosis not present

## 2011-10-28 DIAGNOSIS — R29818 Other symptoms and signs involving the nervous system: Secondary | ICD-10-CM | POA: Diagnosis not present

## 2011-10-28 DIAGNOSIS — Z9181 History of falling: Secondary | ICD-10-CM | POA: Diagnosis not present

## 2011-10-28 DIAGNOSIS — I10 Essential (primary) hypertension: Secondary | ICD-10-CM | POA: Diagnosis not present

## 2011-10-28 DIAGNOSIS — N400 Enlarged prostate without lower urinary tract symptoms: Secondary | ICD-10-CM | POA: Diagnosis not present

## 2011-10-28 DIAGNOSIS — S6990XA Unspecified injury of unspecified wrist, hand and finger(s), initial encounter: Secondary | ICD-10-CM | POA: Diagnosis not present

## 2011-10-28 DIAGNOSIS — R1312 Dysphagia, oropharyngeal phase: Secondary | ICD-10-CM | POA: Diagnosis not present

## 2011-10-28 DIAGNOSIS — R195 Other fecal abnormalities: Secondary | ICD-10-CM | POA: Diagnosis not present

## 2011-10-28 DIAGNOSIS — E559 Vitamin D deficiency, unspecified: Secondary | ICD-10-CM | POA: Diagnosis not present

## 2011-10-28 DIAGNOSIS — L8992 Pressure ulcer of unspecified site, stage 2: Secondary | ICD-10-CM | POA: Diagnosis not present

## 2011-10-28 DIAGNOSIS — R413 Other amnesia: Secondary | ICD-10-CM | POA: Diagnosis not present

## 2011-10-28 DIAGNOSIS — S6980XA Other specified injuries of unspecified wrist, hand and finger(s), initial encounter: Secondary | ICD-10-CM | POA: Diagnosis not present

## 2011-10-28 DIAGNOSIS — G20 Parkinson's disease: Secondary | ICD-10-CM | POA: Diagnosis not present

## 2011-10-28 DIAGNOSIS — H35379 Puckering of macula, unspecified eye: Secondary | ICD-10-CM | POA: Diagnosis not present

## 2011-10-28 DIAGNOSIS — K2100 Gastro-esophageal reflux disease with esophagitis, without bleeding: Secondary | ICD-10-CM | POA: Diagnosis not present

## 2011-10-28 DIAGNOSIS — N179 Acute kidney failure, unspecified: Secondary | ICD-10-CM | POA: Diagnosis not present

## 2011-10-28 DIAGNOSIS — D649 Anemia, unspecified: Secondary | ICD-10-CM | POA: Diagnosis not present

## 2011-10-28 DIAGNOSIS — M6281 Muscle weakness (generalized): Secondary | ICD-10-CM | POA: Diagnosis not present

## 2011-10-28 DIAGNOSIS — R4182 Altered mental status, unspecified: Secondary | ICD-10-CM | POA: Diagnosis not present

## 2011-10-28 LAB — BASIC METABOLIC PANEL
BUN: 15 mg/dL (ref 6–23)
CO2: 17 mEq/L — ABNORMAL LOW (ref 19–32)
Calcium: 8.5 mg/dL (ref 8.4–10.5)
Chloride: 95 mEq/L — ABNORMAL LOW (ref 96–112)
Creatinine, Ser: 1.5 mg/dL — ABNORMAL HIGH (ref 0.50–1.35)
GFR calc non Af Amer: 41 mL/min — ABNORMAL LOW (ref >90)
Glucose, Bld: 130 mg/dL — ABNORMAL HIGH (ref 70–99)
Glucose, Bld: 94 mg/dL (ref 70–99)
Potassium: 3.2 mEq/L — ABNORMAL LOW (ref 3.5–5.1)
Sodium: 125 mEq/L — ABNORMAL LOW (ref 135–145)

## 2011-10-28 LAB — HEMOGLOBIN AND HEMATOCRIT, BLOOD: Hemoglobin: 10.2 g/dL — ABNORMAL LOW (ref 13.0–17.0)

## 2011-10-28 MED ORDER — MAGNESIUM HYDROXIDE 400 MG/5ML PO SUSP
30.0000 mL | Freq: Every day | ORAL | Status: DC
  Administered 2011-10-28: 30 mL via ORAL
  Filled 2011-10-28: qty 30

## 2011-10-28 MED ORDER — PANTOPRAZOLE SODIUM 40 MG PO TBEC: 40.0000 mg | DELAYED_RELEASE_TABLET | Freq: Two times a day (BID) | ORAL | Status: DC

## 2011-10-28 MED ORDER — AMLODIPINE BESYLATE 5 MG PO TABS: 5.0000 mg | ORAL_TABLET | Freq: Every morning | ORAL | Status: DC

## 2011-10-28 MED ORDER — SODIUM CHLORIDE 1 G PO TABS
1.0000 g | ORAL_TABLET | Freq: Two times a day (BID) | ORAL | Status: DC
  Administered 2011-10-28: 1 g via ORAL
  Filled 2011-10-28 (×2): qty 1

## 2011-10-28 MED ORDER — MAGNESIUM HYDROXIDE 400 MG/5ML PO SUSP: 30.0000 mL | Freq: Every day | ORAL | Status: DC | PRN

## 2011-10-28 NOTE — Progress Notes (Signed)
Physical Therapy Treatment Patient Details Name: DEWON MENDIZABAL MRN: [id number] DOB: 1924-10-31 Today's Date: 10/28/2011 Time: 4132-4401 PT Time Calculation (min): 18 min  PT Assessment / Plan / Recommendation Comments on Treatment Session  Pt. just back from xray and did not want to press forward with mobility; was willing to sit EOB for LE general exs briefly.  Has tendancy for posterior lean in sitting, needing cueing for erect sitting.  Needs ongoing PT for eventual return to ALF level.    Follow Up Recommendations  Skilled nursing facility    Barriers to Discharge        Equipment Recommendations  Defer to next venue    Recommendations for Other Services    Frequency Min 3X/week   Plan Discharge plan remains appropriate    Precautions / Restrictions Precautions Precautions: Fall Restrictions Weight Bearing Restrictions: No   Pertinent Vitals/Pain No pain, no distress    Mobility  Bed Mobility Bed Mobility: Supine to Sit;Sitting - Scoot to Edge of Bed Supine to Sit: 3: Mod assist;HOB elevated Sitting - Scoot to Edge of Bed: 4: Min assist;With rail Details for Bed Mobility Assistance: cues for techncique and safety and to self monitor for dizziness Transfers Transfers: Not assessed (pt. declined to attempt) Ambulation/Gait Ambulation/Gait Assistance: Not tested (comment)    Exercises General Exercises - Lower Extremity Ankle Circles/Pumps: AROM;Both;15 reps;Seated Long Arc Quad: AROM;Both;10 reps;Seated   PT Diagnosis:    PT Problem List:   PT Treatment Interventions:     PT Goals Acute Rehab PT Goals PT Goal: Supine/Side to Sit - Progress: Progressing toward goal PT Goal: Sit to Supine/Side - Progress: Progressing toward goal  Visit Information  Last PT Received On: 10/28/11 Assistance Needed: +1    Subjective Data      Cognition  Overall Cognitive Status: Appears within functional limits for tasks assessed/performed Arousal/Alertness:  Awake/alert Orientation Level: Appears intact for tasks assessed Behavior During Session: [facility] for tasks performed    Balance     End of Session PT - End of Session Activity Tolerance: Patient tolerated treatment well Patient left: in bed;with call bell/phone within reach;with family/visitor present Nurse Communication: Mobility status   GP     Ferman Hamming 10/28/2011, 11:42 AM Weldon Picking PT Acute Rehab Services [phone] [place] [phone]

## 2011-10-28 NOTE — Progress Notes (Addendum)
Pt discharged to SNF. Pt remains stable. No signs and symptoms of distress. Educated to return to ER in the event of SOB, dizziness, chest pain, or fainting. Pt POA at the bedside and made aware of d/c Laverda Sorenson, RN

## 2011-10-28 NOTE — Clinical Social Work Psychosocial (Addendum)
    Clinical Social Work Department BRIEF PSYCHOSOCIAL ASSESSMENT 10/28/2011  Patient:  Vincent Potts, Vincent Potts     Account Number:  [id number]     Admit date:  10/24/2011  Clinical Social Worker:  Delmer Islam  Date/Time:  10/28/2011 05:39 AM  Referred by:  Physician  Date Referred:  10/26/2011 Referred for  Other - See comment   Other Referral:   Consult was Admitted from any Facility. Consult later changed to [facility] Placement when short-term rehab recommended.   Interview type:  Other - See comment Other interview type:   CSW talked with Wille Celeste, SW at [facility] regarding patient needed ST rehab.    PSYCHOSOCIAL DATA Living Status:  FACILITY Admitted from facility:  [facility] Level of care:  Assisted Living Primary support name:   Primary support relationship to patient:  FAMILY Degree of support available:   Patient's nephew is Tasia Catchings, who is POA    CURRENT CONCERNS Current Concerns  Post-Acute Placement   Other Concerns:    SOCIAL WORK ASSESSMENT / PLAN Patient from ALF at [facility]. PT evaluation determined that he needed rehab prior to returning home. CSW contacted [facility] [place] regarding need for rehab bed. She had been advised by family and had a bed for patient.   Assessment/plan status:  Psychosocial Support/Ongoing Assessment of Needs Other assessment/ plan:   Information/referral to community resources:    PATIENT'S/FAMILY'S RESPONSE TO PLAN OF CARE: Patient discharged 8/29 to [facility] healthcare for ST rehab. Patient transported to facility by ambulance. Patient's nephew at the bedside and advised that ambulance had been called by nursing.

## 2011-10-28 NOTE — Discharge Summary (Signed)
Physician Discharge Summary  [name]:[id number] DOB: [birth year] DOA: 10/24/2011  PCP: Kimber Relic, MD  Admit date: 10/24/2011 Discharge date: 10/28/2011  Recommendations for Outpatient Follow-up:  1. Please be sure to follow up with Orthopaedic surgeon as well as your PCP 2. You will need to check patient's H/H and sodium levels 3. Also please help set up follow up with urologist given patient's history of non obstructing nephrolithiasis  Discharge Diagnoses:  Active Problems:  Parkinsonism  Hypertension  Acute renal failure  Anemia  Fall  Hyponatremia  Nephrolithiasis   Discharge Condition: Stable  Diet recommendation: Cardiac diet (no sodium restriction for 1 week)  Filed Weights   10/25/11 2054 10/26/11 2123 10/27/11 2006  Weight: 59 kg (130 lb 1.1 oz) 60.419 kg (133 lb 3.2 oz) 60.737 kg (133 lb 14.4 oz)    History of present illness:  From original HPI: Vincent Potts is a 78 y.o. male  has a past medical history of Thyroid disease; Anemia; Vitamin D deficiency disease; Hyponatremia; Anxiety; Parkinsonism; Hearing loss; Hypertension; PVC (premature ventricular contraction); CVA (cerebral infarction); GERD (gastroesophageal reflux disease); Diverticulosis; Sciatica; Back pain; Gait disorder; Cataract; Edema; Keratosis; Sciatica; Cystic disease of liver; Paresthesia; Inguinal hernia; and Hearing loss.  Presented with  Fall in the bathroom after he lost his balance. 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  In ER he was found to be anemic worse then usual. He was also noted to have hemoccult positive stools. He states he had some constipation and today had a large bowel movement. There may have been some small amount of blood in his stool but he's not sure. Denies any abdominal pain no lightheadedness no chest pain or shortness of breath.   Hospital Course:  1. Anemia: - GI on board. Patient for endoscopy 8/26. Currently just noted  diverticulosis on Barium enema with KUB. Reportedly family wants the least invasive evaluation and as such patient had upper endoscopy and barium enema with kub. Per discussion with GI doctor patient is to have MoM for the next 3 days then prn.  Per d/c GI patient cleared to go home today. - Pt with no active bleeding last hemoglobin 10.2  2. Nephrolithiasis  - repeat U/S in 6 months  - Found to be nonobstructing. Will have patient follow up with Urology as outpatient.   3. ARF  - Creatinine currently trending down. U/S shows non obstructive nephrolithiasis. Suspecting mainly prerenal causes given history and improvement with gentle IVF rehydration  4. Hyponatremia  - Improved on IVF's initially.  This seems to be a chronic problem.  Will provide salt tablets today.  Will not restrict sodium intake for the next week [facility]   - Have his primary care physician follow up on results and further testing.  I will forward a copy of this d/c summary to PCP as listed above.  5. Fall  - May have been secondary to symptomatic anemia and parkinsons.   At this point would continue fall precautions [facility].  [facility]  - Ortho following and we will f/u with their recommendations. Pt is s/p left pinky amputation.   Procedures:  BE with contrast with KUB  Upper endoscopy 8/26  Renal u/s 8/25  Consultations:  GI: [facility] (Dr. Randa Evens)  Orthopaedic surgeon Karie Chimera  Discharge Exam: Filed Vitals:   10/28/11 1048  BP: 159/77  Pulse: 51  Temp: 98.4 F (36.9 C)  Resp: 16   Filed Vitals:   10/27/11 2006 10/27/11 2154 10/28/11 0358 10/28/11 1048  BP: 120/72  130/72 159/77  Pulse: 72  52 51  Temp: 97.6 F (36.4 C)  98.1 F (36.7 C) 98.4 F (36.9 C)  TempSrc: Oral  Oral Oral  Resp: 18  18 16   Height: 5\' 7"  (1.702 m)     Weight: 60.737 kg (133 lb 14.4 oz)     SpO2: 98% 95% 96% 98%    General: Pt in NAD,  Alert and Awake Cardiovascular: RRR, No MRG Respiratory: CTA BL, no wheezes Extremity: Gauze in place over left pinky.  No active bleeding Neurological: Patient answers questions appropriately.  Discharge Instructions  Discharge Orders    Future Orders Please Complete By Expires   Diet - low sodium heart healthy      Increase activity slowly      Discharge instructions      Comments:   wound care for left hand pinky and follow up at [facility] orthopaedic center in 10 to 12 days for dressing change and wound check with orthopaedic surgeon Dr. Karie Chimera, during the interim he is to keep the dressings clean and dry and not remove them.  Also you are to have [facility].  For the next three days I would like for you to take Mild of Magnesia (to stop 8/31) then you may take it as needed for constipation.   Call MD for:  temperature >100.4      Call MD for:  severe uncontrolled pain      Call MD for:  redness, tenderness, or signs of infection (pain, swelling, redness, odor or green/yellow discharge around incision site)        Medication List  As of 10/28/2011  1:28 PM   STOP taking these medications         aspirin 325 MG tablet      lisinopril 20 MG tablet      loperamide 2 MG capsule      omeprazole 20 MG capsule         TAKE these medications         acetaminophen 500 MG tablet   Commonly known as: TYLENOL   Take 500 mg by mouth at bedtime. To be given with Restoril      amLODipine 5 MG tablet   Commonly known as: NORVASC   Take 1 tablet (5 mg total) by mouth every morning.      BESIVANCE OP   Place 1 drop into the left eye 3 (three) times daily. Beginning 8/13-13  Until bottle is depleted      carboxymethylcellulose 1 % ophthalmic solution   Apply 1 drop to eye 4 (four) times daily.      cholecalciferol 1000 UNITS tablet   Commonly known as: VITAMIN D   Take 1,000 Units by mouth every morning.      cycloSPORINE  0.05 % ophthalmic emulsion   Commonly known as: RESTASIS   Place 1 drop into both eyes 2 (two) times daily.      feeding supplement Liqd   Take 237 mLs by mouth 2 (two) times daily with a meal.      ferrous sulfate 325 (65 FE) MG tablet   Take 325 mg by mouth 2 (two) times daily.      finasteride 5 MG tablet   Commonly known as: PROSCAR   Take 5 mg  by mouth daily.      GENTEAL OP   Apply 1 drop to eye at bedtime.      magnesium hydroxide 400 MG/5ML suspension   Commonly known as: MILK OF MAGNESIA   Take 30 mLs by mouth daily as needed. Please take 30 ml by mouth for the next three days (util 8/31) then you can take as needed for constipation.      OCUSOFT EYE WASH OP   Apply 1 application to eye 2 (two) times daily. For 2 weeks beginning 10/12/11      oxycodone 5 MG capsule   Commonly known as: OXY-IR   Take 5 mg by mouth every 4 (four) hours as needed. pain      pantoprazole 40 MG tablet   Commonly known as: PROTONIX   Take 1 tablet (40 mg total) by mouth 2 (two) times daily.      ranitidine 150 MG tablet   Commonly known as: ZANTAC   Take 150 mg by mouth at bedtime.      senna 8.6 MG tablet   Commonly known as: SENOKOT   Take 1 tablet by mouth 2 (two) times daily.      simethicone 80 MG chewable tablet   Commonly known as: MYLICON   Chew 80 mg by mouth 4 (four) times daily -  before meals and at bedtime.      Tamsulosin HCl 0.4 MG Caps   Commonly known as: FLOMAX   Take 0.4 mg by mouth every morning.      temazepam 30 MG capsule   Commonly known as: RESTORIL   Take 30 mg by mouth at bedtime. For insomnia              The results of significant diagnostics from this hospitalization (including imaging, microbiology, ancillary and laboratory) are listed below for reference.    Significant Diagnostic Studies: Ct Head Wo Contrast  10/24/2011  *RADIOLOGY REPORT*  Clinical Data: Fall, scalp hematoma.  CT HEAD WITHOUT CONTRAST  Technique:  Contiguous axial  images were obtained from the base of the skull through the vertex without contrast.  Comparison: 04/18/2011  Findings: Prominence of the sulci, cisterns, and ventricles, in keeping with volume loss. There are subcortical and periventricular white matter hypodensities, a nonspecific finding most often seen with chronic microangiopathic changes.  There is no evidence for acute hemorrhage, overt hydrocephalus, mass lesion, or abnormal extra-axial fluid collection.  No definite CT evidence for acute cortical based (large artery) infarction. Prior left caudate head lacunar infarction, unchanged underpneumatized left mastoid air cells.  Paranasal sinuses and mastoid air cells are otherwise predominately clear. Atherosclerotic vascular calcifications.  IMPRESSION: Volume loss, white matter changes, and remote left caudate lacunar infarction.  No acute intracranial abnormality identified.   Original Report Authenticated By: Waneta Martins, M.D.    US Renal  10/24/2011  *RADIOLOGY REPORT*  Clinical Data: Chronic kidney disease  RENAL/URINARY TRACT ULTRASOUND COMPLETE  Comparison:  None.  Findings:  Right Kidney:  Measures 9.0 cm. Echogenic renal parenchyma, likely reflecting medical renal disease.  1.5 x 1.3 x 1.7 cm lower pole cyst.  Additional 9 x 7 by 8 mm hypoechoic lower pole lesion, likely reflecting a cyst, although not simple by ultrasound.  9 mm calculus in the interpolar region.  No hydronephrosis.  Left Kidney:  Measures 9.4 cm.  Echogenic renal parenchyma, likely reflecting medical renal disease.  12 mm calculus in the interpolar region.  Bladder:  Poorly visualized/decompressed.  Additional comments:  Suspected small right pleural effusion.  IMPRESSION: Small bilateral kidneys with echogenic renal parenchyma, likely reflecting medical renal disease.  12 mm nonobstructing left renal calculus.  9 mm nonobstructing right renal calculus.  No hydronephrosis.  9 mm probable left lower pole cyst, although not  simple by ultrasound.  Given small size, consider follow-up ultrasound in 6 months.  MR abdomen could also be considered, although patient would only be eligible for (half-dose) contrast if GFR remains > 30.   Original Report Authenticated By: Charline Bills, M.D.    Dg Chest Port 1 View  10/24/2011  *RADIOLOGY REPORT*  Clinical Data: Fall  PORTABLE CHEST - 1 VIEW  Comparison: 04/18/2011  Findings: Prominent cardiomediastinal contours.  Bibasilar opacities.  Senescent changes interstitial prominence.  Osteopenia. Prior posterior and lateral right side rib fractures.  No definite interval osseous change.  IMPRESSION: Prominent cardiomediastinal contours, suggests aortic tortuosity and or dilatation. Consider follow-up PA and lateral radiographs when the patient can tolerate.  Bibasilar opacities; atelectasis versus infiltrate.   Original Report Authenticated By: Waneta Martins, M.D.    Dg Hand Complete Left  10/24/2011  *RADIOLOGY REPORT*  Clinical Data: 78 year old male status post fall, laceration to the left hand.  LEFT HAND - COMPLETE 3+ VIEW  Comparison: None.  Findings: Traumatic amputation of the tuft of the left fifth distal phalanx.  Associated soft tissue injury.  The middle and proximal fifth phalanges are intact.  Osteopenia. Diffuse changes of osteoarthritis in the interphalangeal joint. Distal radius and ulna appear intact.  Carpal bone alignment within normal limits.  IMPRESSION: 1.  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 2.  No other acute fracture or dislocation.  Diffuse osteoarthritis.   Original Report Authenticated By: Harley Hallmark, M.D.    Dg Colon W/cm - Wo/w Kub  10/28/2011  *RADIOLOGY REPORT*  Clinical Data: Heme positive stool.  BE WITH CONTRAST - WITH KUB (single contrast)  Technique: Single contrast barium enema was performed.  Comparison:  None.  Findings: Initial KUB demonstrates gas in large and small bowel without significant residual stool.   The patient was actively passing fluid by rectum upon arrival to the radiology department. After several cleanings, he was able to tolerate the rectal catheter.  Please note that the patient has significant difficulty turning and cooperating with the exam, which is very limiting for a barium enema exam in which time critical turning and cooperation is necessary for maximal on diagnostic sensitivity.  I obtained a series of spot images initially during filling of the sigmoid colon as the barium column advanced.  There is considerable irregularity and narrowing of the sigmoid colon associated with diffuse sigmoid diverticulosis.  I followed the barium column to the cecum after which I utilized paddle compression to characterize the distal sigmoid, descending, transverse, and ascending colon. When feasible, tilt of the head of the table up to 10 degrees in order to try and cause the various air bubbles in the colon to move and prove that they were not polyps was employed.  Scattered diverticula in the descending and transverse colon noted.  Ileocecal valve unremarkable.  We did not reflux the terminal ileum.  Diverticula of the ascending colon are also noted.  IMPRESSION:  1.  Extensive sigmoid diverticulosis, with scattered diverticula elsewhere in the colon. 2.  No definite mass observed.  There were numerous filling defects throughout the colon but most of these seem to resolve or change significantly on different images, favoring representing gas  bubbles rather than polyps.  Please note that the patient was not able to cooperate with turning and could only assume prone and shallow posterior oblique positioning, resulting in reduced diagnostic sensitivity and specificity as well as reduced negative predictive value compared to what would normally be expected from a barium enema exam.   Original Report Authenticated By: Dellia Cloud, M.D.     Microbiology: Recent Results (from the past 240 hour(s))    MRSA PCR SCREENING     Status: Abnormal   Collection Time   10/24/11  6:43 AM      Component Value Range Status Comment   MRSA by PCR POSITIVE (*) NEGATIVE Final      Labs: Basic Metabolic Panel:  Lab 10/28/11 4098 10/28/11 0058 10/27/11 2059 10/27/11 1204 10/27/11 1025 10/27/11 0530 10/26/11 0823 10/25/11 1210 10/25/11 0142  NA 126* 125* 128* 129* 130* -- -- -- --  K -- 3.2* 3.7 -- -- 4.1 3.9 4.1 --  CL -- 95* 98 -- -- 102 104 102 --  CO2 -- 18* 21 -- -- 19 20 18* --  GLUCOSE -- 130* 120* -- -- 84 86 87 --  BUN -- 15 16 -- -- 19 23 26* --  CREATININE -- 1.50* 1.59* -- -- 1.72* 1.88* 2.04* --  CALCIUM -- 8.3* 8.7 -- -- 8.6 8.5 8.7 --  MG -- -- -- -- -- -- -- -- 2.0  PHOS -- -- -- -- -- -- -- -- 3.1   Liver Function Tests:  Lab 10/24/11 0835 10/24/11 0121  AST 15 9  ALT 7 6  ALKPHOS 60 53  BILITOT 0.1* 0.1*  PROT 6.3 5.8*  ALBUMIN 2.8* 2.7*   No results found for this basename: LIPASE:5,AMYLASE:5 in the last 168 hours No results found for this basename: AMMONIA:5 in the last 168 hours CBC:  Lab 10/26/11 0823 10/25/11 0142 10/24/11 2230 10/24/11 1640 10/24/11 0835 10/24/11 0121  WBC 7.5 6.5 6.9 5.8 7.0 --  NEUTROABS -- -- -- -- -- 4.6  HGB 10.2* 9.6* 9.7* 9.8* 9.0* --  HCT 29.5* 27.6* 27.6* 28.6* 26.6* --  MCV 87.8 85.7 85.7 86.9 87.8 --  PLT 244 228 247 260 281 --   Cardiac Enzymes: No results found for this basename: CKTOTAL:5,CKMB:5,CKMBINDEX:5,TROPONINI:5 in the last 168 hours BNP: BNP (last 3 results) No results found for this basename: PROBNP:3 in the last 8760 hours CBG: No results found for this basename: GLUCAP:5 in the last 168 hours  Time coordinating discharge: >50 minutes  Signed:  Penny Pia  [facility] 10/28/2011, 1:28 PM

## 2011-10-29 DIAGNOSIS — L8992 Pressure ulcer of unspecified site, stage 2: Secondary | ICD-10-CM | POA: Diagnosis not present

## 2011-10-29 DIAGNOSIS — D649 Anemia, unspecified: Secondary | ICD-10-CM | POA: Diagnosis not present

## 2011-10-29 DIAGNOSIS — I10 Essential (primary) hypertension: Secondary | ICD-10-CM | POA: Diagnosis not present

## 2011-10-29 DIAGNOSIS — E871 Hypo-osmolality and hyponatremia: Secondary | ICD-10-CM | POA: Diagnosis not present

## 2011-10-29 DIAGNOSIS — R269 Unspecified abnormalities of gait and mobility: Secondary | ICD-10-CM | POA: Diagnosis not present

## 2011-11-02 DIAGNOSIS — I10 Essential (primary) hypertension: Secondary | ICD-10-CM | POA: Diagnosis not present

## 2011-11-02 DIAGNOSIS — R269 Unspecified abnormalities of gait and mobility: Secondary | ICD-10-CM | POA: Diagnosis not present

## 2011-11-02 DIAGNOSIS — G20 Parkinson's disease: Secondary | ICD-10-CM | POA: Diagnosis not present

## 2011-11-02 DIAGNOSIS — E871 Hypo-osmolality and hyponatremia: Secondary | ICD-10-CM | POA: Diagnosis not present

## 2011-11-02 DIAGNOSIS — S6990XA Unspecified injury of unspecified wrist, hand and finger(s), initial encounter: Secondary | ICD-10-CM | POA: Diagnosis not present

## 2011-11-02 DIAGNOSIS — L8992 Pressure ulcer of unspecified site, stage 2: Secondary | ICD-10-CM | POA: Diagnosis not present

## 2011-11-05 DIAGNOSIS — R609 Edema, unspecified: Secondary | ICD-10-CM | POA: Diagnosis not present

## 2011-11-05 DIAGNOSIS — I10 Essential (primary) hypertension: Secondary | ICD-10-CM | POA: Diagnosis not present

## 2011-11-05 DIAGNOSIS — E871 Hypo-osmolality and hyponatremia: Secondary | ICD-10-CM | POA: Diagnosis not present

## 2011-11-05 DIAGNOSIS — R413 Other amnesia: Secondary | ICD-10-CM | POA: Diagnosis not present

## 2011-11-05 DIAGNOSIS — D649 Anemia, unspecified: Secondary | ICD-10-CM | POA: Diagnosis not present

## 2011-11-05 DIAGNOSIS — L8992 Pressure ulcer of unspecified site, stage 2: Secondary | ICD-10-CM | POA: Diagnosis not present

## 2011-11-08 DIAGNOSIS — IMO0002 Reserved for concepts with insufficient information to code with codable children: Secondary | ICD-10-CM | POA: Diagnosis not present

## 2011-11-19 DIAGNOSIS — E871 Hypo-osmolality and hyponatremia: Secondary | ICD-10-CM | POA: Diagnosis not present

## 2011-11-19 DIAGNOSIS — R413 Other amnesia: Secondary | ICD-10-CM | POA: Diagnosis not present

## 2011-11-19 DIAGNOSIS — E559 Vitamin D deficiency, unspecified: Secondary | ICD-10-CM | POA: Diagnosis not present

## 2011-11-19 DIAGNOSIS — N400 Enlarged prostate without lower urinary tract symptoms: Secondary | ICD-10-CM | POA: Diagnosis not present

## 2011-11-19 DIAGNOSIS — D649 Anemia, unspecified: Secondary | ICD-10-CM | POA: Diagnosis not present

## 2011-11-19 DIAGNOSIS — I10 Essential (primary) hypertension: Secondary | ICD-10-CM | POA: Diagnosis not present

## 2011-11-22 DIAGNOSIS — IMO0002 Reserved for concepts with insufficient information to code with codable children: Secondary | ICD-10-CM | POA: Diagnosis not present

## 2011-12-01 DIAGNOSIS — R269 Unspecified abnormalities of gait and mobility: Secondary | ICD-10-CM | POA: Diagnosis not present

## 2011-12-01 DIAGNOSIS — Z9181 History of falling: Secondary | ICD-10-CM | POA: Diagnosis not present

## 2011-12-01 DIAGNOSIS — I6990 Unspecified sequelae of unspecified cerebrovascular disease: Secondary | ICD-10-CM | POA: Diagnosis not present

## 2011-12-01 DIAGNOSIS — G20 Parkinson's disease: Secondary | ICD-10-CM | POA: Diagnosis not present

## 2011-12-02 DIAGNOSIS — R269 Unspecified abnormalities of gait and mobility: Secondary | ICD-10-CM | POA: Diagnosis not present

## 2011-12-02 DIAGNOSIS — I6990 Unspecified sequelae of unspecified cerebrovascular disease: Secondary | ICD-10-CM | POA: Diagnosis not present

## 2011-12-02 DIAGNOSIS — G20 Parkinson's disease: Secondary | ICD-10-CM | POA: Diagnosis not present

## 2011-12-02 DIAGNOSIS — Z9181 History of falling: Secondary | ICD-10-CM | POA: Diagnosis not present

## 2011-12-03 DIAGNOSIS — G20 Parkinson's disease: Secondary | ICD-10-CM | POA: Diagnosis not present

## 2011-12-03 DIAGNOSIS — I6990 Unspecified sequelae of unspecified cerebrovascular disease: Secondary | ICD-10-CM | POA: Diagnosis not present

## 2011-12-03 DIAGNOSIS — R269 Unspecified abnormalities of gait and mobility: Secondary | ICD-10-CM | POA: Diagnosis not present

## 2011-12-03 DIAGNOSIS — Z9181 History of falling: Secondary | ICD-10-CM | POA: Diagnosis not present

## 2011-12-06 DIAGNOSIS — D649 Anemia, unspecified: Secondary | ICD-10-CM | POA: Diagnosis not present

## 2011-12-06 DIAGNOSIS — Z79899 Other long term (current) drug therapy: Secondary | ICD-10-CM | POA: Diagnosis not present

## 2011-12-07 DIAGNOSIS — Z9181 History of falling: Secondary | ICD-10-CM | POA: Diagnosis not present

## 2011-12-07 DIAGNOSIS — R269 Unspecified abnormalities of gait and mobility: Secondary | ICD-10-CM | POA: Diagnosis not present

## 2011-12-07 DIAGNOSIS — I6990 Unspecified sequelae of unspecified cerebrovascular disease: Secondary | ICD-10-CM | POA: Diagnosis not present

## 2011-12-07 DIAGNOSIS — G20 Parkinson's disease: Secondary | ICD-10-CM | POA: Diagnosis not present

## 2011-12-08 DIAGNOSIS — Z9181 History of falling: Secondary | ICD-10-CM | POA: Diagnosis not present

## 2011-12-08 DIAGNOSIS — R269 Unspecified abnormalities of gait and mobility: Secondary | ICD-10-CM | POA: Diagnosis not present

## 2011-12-08 DIAGNOSIS — G20 Parkinson's disease: Secondary | ICD-10-CM | POA: Diagnosis not present

## 2011-12-08 DIAGNOSIS — I6990 Unspecified sequelae of unspecified cerebrovascular disease: Secondary | ICD-10-CM | POA: Diagnosis not present

## 2011-12-09 DIAGNOSIS — R269 Unspecified abnormalities of gait and mobility: Secondary | ICD-10-CM | POA: Diagnosis not present

## 2011-12-09 DIAGNOSIS — G20 Parkinson's disease: Secondary | ICD-10-CM | POA: Diagnosis not present

## 2011-12-09 DIAGNOSIS — Z9181 History of falling: Secondary | ICD-10-CM | POA: Diagnosis not present

## 2011-12-09 DIAGNOSIS — I6990 Unspecified sequelae of unspecified cerebrovascular disease: Secondary | ICD-10-CM | POA: Diagnosis not present

## 2011-12-13 DIAGNOSIS — R269 Unspecified abnormalities of gait and mobility: Secondary | ICD-10-CM | POA: Diagnosis not present

## 2011-12-13 DIAGNOSIS — G20 Parkinson's disease: Secondary | ICD-10-CM | POA: Diagnosis not present

## 2011-12-13 DIAGNOSIS — Z9181 History of falling: Secondary | ICD-10-CM | POA: Diagnosis not present

## 2011-12-13 DIAGNOSIS — I6990 Unspecified sequelae of unspecified cerebrovascular disease: Secondary | ICD-10-CM | POA: Diagnosis not present

## 2011-12-15 DIAGNOSIS — G20 Parkinson's disease: Secondary | ICD-10-CM | POA: Diagnosis not present

## 2011-12-15 DIAGNOSIS — R269 Unspecified abnormalities of gait and mobility: Secondary | ICD-10-CM | POA: Diagnosis not present

## 2011-12-15 DIAGNOSIS — Z9181 History of falling: Secondary | ICD-10-CM | POA: Diagnosis not present

## 2011-12-15 DIAGNOSIS — I6990 Unspecified sequelae of unspecified cerebrovascular disease: Secondary | ICD-10-CM | POA: Diagnosis not present

## 2011-12-16 DIAGNOSIS — Z9181 History of falling: Secondary | ICD-10-CM | POA: Diagnosis not present

## 2011-12-16 DIAGNOSIS — G20 Parkinson's disease: Secondary | ICD-10-CM | POA: Diagnosis not present

## 2011-12-16 DIAGNOSIS — R269 Unspecified abnormalities of gait and mobility: Secondary | ICD-10-CM | POA: Diagnosis not present

## 2011-12-16 DIAGNOSIS — I6990 Unspecified sequelae of unspecified cerebrovascular disease: Secondary | ICD-10-CM | POA: Diagnosis not present

## 2011-12-20 DIAGNOSIS — G20 Parkinson's disease: Secondary | ICD-10-CM | POA: Diagnosis not present

## 2011-12-20 DIAGNOSIS — Z9181 History of falling: Secondary | ICD-10-CM | POA: Diagnosis not present

## 2011-12-20 DIAGNOSIS — I6990 Unspecified sequelae of unspecified cerebrovascular disease: Secondary | ICD-10-CM | POA: Diagnosis not present

## 2011-12-20 DIAGNOSIS — R269 Unspecified abnormalities of gait and mobility: Secondary | ICD-10-CM | POA: Diagnosis not present

## 2011-12-22 DIAGNOSIS — G20 Parkinson's disease: Secondary | ICD-10-CM | POA: Diagnosis not present

## 2011-12-22 DIAGNOSIS — Z9181 History of falling: Secondary | ICD-10-CM | POA: Diagnosis not present

## 2011-12-22 DIAGNOSIS — R269 Unspecified abnormalities of gait and mobility: Secondary | ICD-10-CM | POA: Diagnosis not present

## 2011-12-22 DIAGNOSIS — I6990 Unspecified sequelae of unspecified cerebrovascular disease: Secondary | ICD-10-CM | POA: Diagnosis not present

## 2011-12-28 DIAGNOSIS — I10 Essential (primary) hypertension: Secondary | ICD-10-CM | POA: Diagnosis not present

## 2011-12-28 DIAGNOSIS — E871 Hypo-osmolality and hyponatremia: Secondary | ICD-10-CM | POA: Diagnosis not present

## 2011-12-28 DIAGNOSIS — R4182 Altered mental status, unspecified: Secondary | ICD-10-CM | POA: Diagnosis not present

## 2011-12-28 DIAGNOSIS — K5900 Constipation, unspecified: Secondary | ICD-10-CM | POA: Diagnosis not present

## 2011-12-28 DIAGNOSIS — D649 Anemia, unspecified: Secondary | ICD-10-CM | POA: Diagnosis not present

## 2011-12-28 DIAGNOSIS — N390 Urinary tract infection, site not specified: Secondary | ICD-10-CM | POA: Diagnosis not present

## 2011-12-30 DIAGNOSIS — D649 Anemia, unspecified: Secondary | ICD-10-CM | POA: Diagnosis not present

## 2011-12-30 DIAGNOSIS — N179 Acute kidney failure, unspecified: Secondary | ICD-10-CM | POA: Diagnosis not present

## 2011-12-30 DIAGNOSIS — I10 Essential (primary) hypertension: Secondary | ICD-10-CM | POA: Diagnosis not present

## 2011-12-30 DIAGNOSIS — E871 Hypo-osmolality and hyponatremia: Secondary | ICD-10-CM | POA: Diagnosis not present

## 2011-12-30 DIAGNOSIS — D7289 Other specified disorders of white blood cells: Secondary | ICD-10-CM | POA: Diagnosis not present

## 2011-12-30 DIAGNOSIS — N400 Enlarged prostate without lower urinary tract symptoms: Secondary | ICD-10-CM | POA: Diagnosis not present

## 2011-12-31 DIAGNOSIS — N400 Enlarged prostate without lower urinary tract symptoms: Secondary | ICD-10-CM | POA: Diagnosis not present

## 2011-12-31 DIAGNOSIS — R4182 Altered mental status, unspecified: Secondary | ICD-10-CM | POA: Diagnosis not present

## 2011-12-31 DIAGNOSIS — D7289 Other specified disorders of white blood cells: Secondary | ICD-10-CM | POA: Diagnosis not present

## 2011-12-31 DIAGNOSIS — E871 Hypo-osmolality and hyponatremia: Secondary | ICD-10-CM | POA: Diagnosis not present

## 2011-12-31 DIAGNOSIS — E876 Hypokalemia: Secondary | ICD-10-CM | POA: Diagnosis not present

## 2011-12-31 DIAGNOSIS — N179 Acute kidney failure, unspecified: Secondary | ICD-10-CM | POA: Diagnosis not present

## 2012-01-03 DIAGNOSIS — I10 Essential (primary) hypertension: Secondary | ICD-10-CM | POA: Diagnosis not present

## 2012-01-11 DIAGNOSIS — D7289 Other specified disorders of white blood cells: Secondary | ICD-10-CM | POA: Diagnosis not present

## 2012-01-11 DIAGNOSIS — I10 Essential (primary) hypertension: Secondary | ICD-10-CM | POA: Diagnosis not present

## 2012-01-11 DIAGNOSIS — D649 Anemia, unspecified: Secondary | ICD-10-CM | POA: Diagnosis not present

## 2012-01-11 DIAGNOSIS — F0390 Unspecified dementia without behavioral disturbance: Secondary | ICD-10-CM | POA: Diagnosis not present

## 2012-01-11 DIAGNOSIS — N400 Enlarged prostate without lower urinary tract symptoms: Secondary | ICD-10-CM | POA: Diagnosis not present

## 2012-01-11 DIAGNOSIS — E871 Hypo-osmolality and hyponatremia: Secondary | ICD-10-CM | POA: Diagnosis not present

## 2012-01-13 DIAGNOSIS — E039 Hypothyroidism, unspecified: Secondary | ICD-10-CM | POA: Diagnosis not present

## 2012-01-13 DIAGNOSIS — R4182 Altered mental status, unspecified: Secondary | ICD-10-CM | POA: Diagnosis not present

## 2012-01-13 DIAGNOSIS — R10819 Abdominal tenderness, unspecified site: Secondary | ICD-10-CM | POA: Diagnosis not present

## 2012-01-13 DIAGNOSIS — N390 Urinary tract infection, site not specified: Secondary | ICD-10-CM | POA: Diagnosis not present

## 2012-01-13 DIAGNOSIS — D649 Anemia, unspecified: Secondary | ICD-10-CM | POA: Diagnosis not present

## 2012-01-13 DIAGNOSIS — I10 Essential (primary) hypertension: Secondary | ICD-10-CM | POA: Diagnosis not present

## 2012-01-14 DIAGNOSIS — E871 Hypo-osmolality and hyponatremia: Secondary | ICD-10-CM | POA: Diagnosis not present

## 2012-01-14 DIAGNOSIS — F0390 Unspecified dementia without behavioral disturbance: Secondary | ICD-10-CM | POA: Diagnosis not present

## 2012-01-14 DIAGNOSIS — N179 Acute kidney failure, unspecified: Secondary | ICD-10-CM | POA: Diagnosis not present

## 2012-01-14 DIAGNOSIS — G4700 Insomnia, unspecified: Secondary | ICD-10-CM | POA: Diagnosis not present

## 2012-01-14 DIAGNOSIS — R269 Unspecified abnormalities of gait and mobility: Secondary | ICD-10-CM | POA: Diagnosis not present

## 2012-01-17 DIAGNOSIS — R262 Difficulty in walking, not elsewhere classified: Secondary | ICD-10-CM | POA: Diagnosis not present

## 2012-01-17 DIAGNOSIS — M6281 Muscle weakness (generalized): Secondary | ICD-10-CM | POA: Diagnosis not present

## 2012-01-17 DIAGNOSIS — I6990 Unspecified sequelae of unspecified cerebrovascular disease: Secondary | ICD-10-CM | POA: Diagnosis not present

## 2012-01-17 DIAGNOSIS — R29818 Other symptoms and signs involving the nervous system: Secondary | ICD-10-CM | POA: Diagnosis not present

## 2012-01-17 DIAGNOSIS — R279 Unspecified lack of coordination: Secondary | ICD-10-CM | POA: Diagnosis not present

## 2012-01-17 DIAGNOSIS — N179 Acute kidney failure, unspecified: Secondary | ICD-10-CM | POA: Diagnosis not present

## 2012-01-17 DIAGNOSIS — I10 Essential (primary) hypertension: Secondary | ICD-10-CM | POA: Diagnosis not present

## 2012-01-18 DIAGNOSIS — R262 Difficulty in walking, not elsewhere classified: Secondary | ICD-10-CM | POA: Diagnosis not present

## 2012-01-18 DIAGNOSIS — R279 Unspecified lack of coordination: Secondary | ICD-10-CM | POA: Diagnosis not present

## 2012-01-18 DIAGNOSIS — M6281 Muscle weakness (generalized): Secondary | ICD-10-CM | POA: Diagnosis not present

## 2012-01-18 DIAGNOSIS — N179 Acute kidney failure, unspecified: Secondary | ICD-10-CM | POA: Diagnosis not present

## 2012-01-18 DIAGNOSIS — R29818 Other symptoms and signs involving the nervous system: Secondary | ICD-10-CM | POA: Diagnosis not present

## 2012-01-18 DIAGNOSIS — I6990 Unspecified sequelae of unspecified cerebrovascular disease: Secondary | ICD-10-CM | POA: Diagnosis not present

## 2012-01-19 DIAGNOSIS — H40019 Open angle with borderline findings, low risk, unspecified eye: Secondary | ICD-10-CM | POA: Diagnosis not present

## 2012-01-19 DIAGNOSIS — H04129 Dry eye syndrome of unspecified lacrimal gland: Secondary | ICD-10-CM | POA: Diagnosis not present

## 2012-01-19 DIAGNOSIS — H01009 Unspecified blepharitis unspecified eye, unspecified eyelid: Secondary | ICD-10-CM | POA: Diagnosis not present

## 2012-01-20 DIAGNOSIS — R29818 Other symptoms and signs involving the nervous system: Secondary | ICD-10-CM | POA: Diagnosis not present

## 2012-01-20 DIAGNOSIS — N179 Acute kidney failure, unspecified: Secondary | ICD-10-CM | POA: Diagnosis not present

## 2012-01-20 DIAGNOSIS — R279 Unspecified lack of coordination: Secondary | ICD-10-CM | POA: Diagnosis not present

## 2012-01-20 DIAGNOSIS — I6990 Unspecified sequelae of unspecified cerebrovascular disease: Secondary | ICD-10-CM | POA: Diagnosis not present

## 2012-01-20 DIAGNOSIS — R262 Difficulty in walking, not elsewhere classified: Secondary | ICD-10-CM | POA: Diagnosis not present

## 2012-01-20 DIAGNOSIS — M6281 Muscle weakness (generalized): Secondary | ICD-10-CM | POA: Diagnosis not present

## 2012-01-24 DIAGNOSIS — N179 Acute kidney failure, unspecified: Secondary | ICD-10-CM | POA: Diagnosis not present

## 2012-01-24 DIAGNOSIS — R262 Difficulty in walking, not elsewhere classified: Secondary | ICD-10-CM | POA: Diagnosis not present

## 2012-01-24 DIAGNOSIS — I6990 Unspecified sequelae of unspecified cerebrovascular disease: Secondary | ICD-10-CM | POA: Diagnosis not present

## 2012-01-24 DIAGNOSIS — R279 Unspecified lack of coordination: Secondary | ICD-10-CM | POA: Diagnosis not present

## 2012-01-24 DIAGNOSIS — R29818 Other symptoms and signs involving the nervous system: Secondary | ICD-10-CM | POA: Diagnosis not present

## 2012-01-24 DIAGNOSIS — M6281 Muscle weakness (generalized): Secondary | ICD-10-CM | POA: Diagnosis not present

## 2012-01-25 DIAGNOSIS — I6990 Unspecified sequelae of unspecified cerebrovascular disease: Secondary | ICD-10-CM | POA: Diagnosis not present

## 2012-01-25 DIAGNOSIS — R262 Difficulty in walking, not elsewhere classified: Secondary | ICD-10-CM | POA: Diagnosis not present

## 2012-01-25 DIAGNOSIS — N179 Acute kidney failure, unspecified: Secondary | ICD-10-CM | POA: Diagnosis not present

## 2012-01-25 DIAGNOSIS — M6281 Muscle weakness (generalized): Secondary | ICD-10-CM | POA: Diagnosis not present

## 2012-01-25 DIAGNOSIS — R29818 Other symptoms and signs involving the nervous system: Secondary | ICD-10-CM | POA: Diagnosis not present

## 2012-01-25 DIAGNOSIS — R279 Unspecified lack of coordination: Secondary | ICD-10-CM | POA: Diagnosis not present

## 2012-01-26 DIAGNOSIS — R279 Unspecified lack of coordination: Secondary | ICD-10-CM | POA: Diagnosis not present

## 2012-01-26 DIAGNOSIS — N179 Acute kidney failure, unspecified: Secondary | ICD-10-CM | POA: Diagnosis not present

## 2012-01-26 DIAGNOSIS — M6281 Muscle weakness (generalized): Secondary | ICD-10-CM | POA: Diagnosis not present

## 2012-01-26 DIAGNOSIS — I6990 Unspecified sequelae of unspecified cerebrovascular disease: Secondary | ICD-10-CM | POA: Diagnosis not present

## 2012-01-26 DIAGNOSIS — R29818 Other symptoms and signs involving the nervous system: Secondary | ICD-10-CM | POA: Diagnosis not present

## 2012-01-26 DIAGNOSIS — R262 Difficulty in walking, not elsewhere classified: Secondary | ICD-10-CM | POA: Diagnosis not present

## 2012-01-31 DIAGNOSIS — R262 Difficulty in walking, not elsewhere classified: Secondary | ICD-10-CM | POA: Diagnosis not present

## 2012-01-31 DIAGNOSIS — M6281 Muscle weakness (generalized): Secondary | ICD-10-CM | POA: Diagnosis not present

## 2012-01-31 DIAGNOSIS — R279 Unspecified lack of coordination: Secondary | ICD-10-CM | POA: Diagnosis not present

## 2012-01-31 DIAGNOSIS — R488 Other symbolic dysfunctions: Secondary | ICD-10-CM | POA: Diagnosis not present

## 2012-01-31 DIAGNOSIS — R471 Dysarthria and anarthria: Secondary | ICD-10-CM | POA: Diagnosis not present

## 2012-02-02 DIAGNOSIS — R471 Dysarthria and anarthria: Secondary | ICD-10-CM | POA: Diagnosis not present

## 2012-02-02 DIAGNOSIS — R279 Unspecified lack of coordination: Secondary | ICD-10-CM | POA: Diagnosis not present

## 2012-02-02 DIAGNOSIS — R488 Other symbolic dysfunctions: Secondary | ICD-10-CM | POA: Diagnosis not present

## 2012-02-02 DIAGNOSIS — M6281 Muscle weakness (generalized): Secondary | ICD-10-CM | POA: Diagnosis not present

## 2012-02-02 DIAGNOSIS — R262 Difficulty in walking, not elsewhere classified: Secondary | ICD-10-CM | POA: Diagnosis not present

## 2012-02-03 DIAGNOSIS — R279 Unspecified lack of coordination: Secondary | ICD-10-CM | POA: Diagnosis not present

## 2012-02-03 DIAGNOSIS — R262 Difficulty in walking, not elsewhere classified: Secondary | ICD-10-CM | POA: Diagnosis not present

## 2012-02-03 DIAGNOSIS — M6281 Muscle weakness (generalized): Secondary | ICD-10-CM | POA: Diagnosis not present

## 2012-02-03 DIAGNOSIS — R471 Dysarthria and anarthria: Secondary | ICD-10-CM | POA: Diagnosis not present

## 2012-02-03 DIAGNOSIS — R488 Other symbolic dysfunctions: Secondary | ICD-10-CM | POA: Diagnosis not present

## 2012-02-04 DIAGNOSIS — R471 Dysarthria and anarthria: Secondary | ICD-10-CM | POA: Diagnosis not present

## 2012-02-04 DIAGNOSIS — M6281 Muscle weakness (generalized): Secondary | ICD-10-CM | POA: Diagnosis not present

## 2012-02-04 DIAGNOSIS — R262 Difficulty in walking, not elsewhere classified: Secondary | ICD-10-CM | POA: Diagnosis not present

## 2012-02-04 DIAGNOSIS — R488 Other symbolic dysfunctions: Secondary | ICD-10-CM | POA: Diagnosis not present

## 2012-02-04 DIAGNOSIS — R279 Unspecified lack of coordination: Secondary | ICD-10-CM | POA: Diagnosis not present

## 2012-02-07 DIAGNOSIS — R488 Other symbolic dysfunctions: Secondary | ICD-10-CM | POA: Diagnosis not present

## 2012-02-07 DIAGNOSIS — R262 Difficulty in walking, not elsewhere classified: Secondary | ICD-10-CM | POA: Diagnosis not present

## 2012-02-07 DIAGNOSIS — M6281 Muscle weakness (generalized): Secondary | ICD-10-CM | POA: Diagnosis not present

## 2012-02-07 DIAGNOSIS — R471 Dysarthria and anarthria: Secondary | ICD-10-CM | POA: Diagnosis not present

## 2012-02-07 DIAGNOSIS — R279 Unspecified lack of coordination: Secondary | ICD-10-CM | POA: Diagnosis not present

## 2012-02-09 DIAGNOSIS — R488 Other symbolic dysfunctions: Secondary | ICD-10-CM | POA: Diagnosis not present

## 2012-02-09 DIAGNOSIS — R471 Dysarthria and anarthria: Secondary | ICD-10-CM | POA: Diagnosis not present

## 2012-02-09 DIAGNOSIS — M6281 Muscle weakness (generalized): Secondary | ICD-10-CM | POA: Diagnosis not present

## 2012-02-09 DIAGNOSIS — R279 Unspecified lack of coordination: Secondary | ICD-10-CM | POA: Diagnosis not present

## 2012-02-09 DIAGNOSIS — R262 Difficulty in walking, not elsewhere classified: Secondary | ICD-10-CM | POA: Diagnosis not present

## 2012-02-10 DIAGNOSIS — R471 Dysarthria and anarthria: Secondary | ICD-10-CM | POA: Diagnosis not present

## 2012-02-10 DIAGNOSIS — R262 Difficulty in walking, not elsewhere classified: Secondary | ICD-10-CM | POA: Diagnosis not present

## 2012-02-10 DIAGNOSIS — R279 Unspecified lack of coordination: Secondary | ICD-10-CM | POA: Diagnosis not present

## 2012-02-10 DIAGNOSIS — M6281 Muscle weakness (generalized): Secondary | ICD-10-CM | POA: Diagnosis not present

## 2012-02-10 DIAGNOSIS — R488 Other symbolic dysfunctions: Secondary | ICD-10-CM | POA: Diagnosis not present

## 2012-02-11 DIAGNOSIS — M6281 Muscle weakness (generalized): Secondary | ICD-10-CM | POA: Diagnosis not present

## 2012-02-11 DIAGNOSIS — R279 Unspecified lack of coordination: Secondary | ICD-10-CM | POA: Diagnosis not present

## 2012-02-11 DIAGNOSIS — R262 Difficulty in walking, not elsewhere classified: Secondary | ICD-10-CM | POA: Diagnosis not present

## 2012-02-11 DIAGNOSIS — R488 Other symbolic dysfunctions: Secondary | ICD-10-CM | POA: Diagnosis not present

## 2012-02-11 DIAGNOSIS — R471 Dysarthria and anarthria: Secondary | ICD-10-CM | POA: Diagnosis not present

## 2012-02-14 DIAGNOSIS — M6281 Muscle weakness (generalized): Secondary | ICD-10-CM | POA: Diagnosis not present

## 2012-02-14 DIAGNOSIS — R471 Dysarthria and anarthria: Secondary | ICD-10-CM | POA: Diagnosis not present

## 2012-02-14 DIAGNOSIS — R262 Difficulty in walking, not elsewhere classified: Secondary | ICD-10-CM | POA: Diagnosis not present

## 2012-02-14 DIAGNOSIS — R488 Other symbolic dysfunctions: Secondary | ICD-10-CM | POA: Diagnosis not present

## 2012-02-14 DIAGNOSIS — R279 Unspecified lack of coordination: Secondary | ICD-10-CM | POA: Diagnosis not present

## 2012-02-15 DIAGNOSIS — R262 Difficulty in walking, not elsewhere classified: Secondary | ICD-10-CM | POA: Diagnosis not present

## 2012-02-15 DIAGNOSIS — R488 Other symbolic dysfunctions: Secondary | ICD-10-CM | POA: Diagnosis not present

## 2012-02-15 DIAGNOSIS — R471 Dysarthria and anarthria: Secondary | ICD-10-CM | POA: Diagnosis not present

## 2012-02-15 DIAGNOSIS — M6281 Muscle weakness (generalized): Secondary | ICD-10-CM | POA: Diagnosis not present

## 2012-02-15 DIAGNOSIS — R279 Unspecified lack of coordination: Secondary | ICD-10-CM | POA: Diagnosis not present

## 2012-02-17 DIAGNOSIS — R262 Difficulty in walking, not elsewhere classified: Secondary | ICD-10-CM | POA: Diagnosis not present

## 2012-02-17 DIAGNOSIS — M6281 Muscle weakness (generalized): Secondary | ICD-10-CM | POA: Diagnosis not present

## 2012-02-17 DIAGNOSIS — R279 Unspecified lack of coordination: Secondary | ICD-10-CM | POA: Diagnosis not present

## 2012-02-17 DIAGNOSIS — R471 Dysarthria and anarthria: Secondary | ICD-10-CM | POA: Diagnosis not present

## 2012-02-17 DIAGNOSIS — R488 Other symbolic dysfunctions: Secondary | ICD-10-CM | POA: Diagnosis not present

## 2012-02-18 DIAGNOSIS — R488 Other symbolic dysfunctions: Secondary | ICD-10-CM | POA: Diagnosis not present

## 2012-02-18 DIAGNOSIS — M6281 Muscle weakness (generalized): Secondary | ICD-10-CM | POA: Diagnosis not present

## 2012-02-18 DIAGNOSIS — R471 Dysarthria and anarthria: Secondary | ICD-10-CM | POA: Diagnosis not present

## 2012-02-18 DIAGNOSIS — R262 Difficulty in walking, not elsewhere classified: Secondary | ICD-10-CM | POA: Diagnosis not present

## 2012-02-18 DIAGNOSIS — R279 Unspecified lack of coordination: Secondary | ICD-10-CM | POA: Diagnosis not present

## 2012-02-22 DIAGNOSIS — M6281 Muscle weakness (generalized): Secondary | ICD-10-CM | POA: Diagnosis not present

## 2012-02-22 DIAGNOSIS — R279 Unspecified lack of coordination: Secondary | ICD-10-CM | POA: Diagnosis not present

## 2012-02-22 DIAGNOSIS — R488 Other symbolic dysfunctions: Secondary | ICD-10-CM | POA: Diagnosis not present

## 2012-02-22 DIAGNOSIS — R471 Dysarthria and anarthria: Secondary | ICD-10-CM | POA: Diagnosis not present

## 2012-02-22 DIAGNOSIS — R262 Difficulty in walking, not elsewhere classified: Secondary | ICD-10-CM | POA: Diagnosis not present

## 2012-02-24 DIAGNOSIS — R279 Unspecified lack of coordination: Secondary | ICD-10-CM | POA: Diagnosis not present

## 2012-02-24 DIAGNOSIS — M6281 Muscle weakness (generalized): Secondary | ICD-10-CM | POA: Diagnosis not present

## 2012-02-24 DIAGNOSIS — R262 Difficulty in walking, not elsewhere classified: Secondary | ICD-10-CM | POA: Diagnosis not present

## 2012-02-24 DIAGNOSIS — R488 Other symbolic dysfunctions: Secondary | ICD-10-CM | POA: Diagnosis not present

## 2012-02-24 DIAGNOSIS — R471 Dysarthria and anarthria: Secondary | ICD-10-CM | POA: Diagnosis not present

## 2012-02-25 DIAGNOSIS — D649 Anemia, unspecified: Secondary | ICD-10-CM | POA: Diagnosis not present

## 2012-02-25 DIAGNOSIS — E876 Hypokalemia: Secondary | ICD-10-CM | POA: Diagnosis not present

## 2012-02-25 DIAGNOSIS — F0390 Unspecified dementia without behavioral disturbance: Secondary | ICD-10-CM | POA: Diagnosis not present

## 2012-02-25 DIAGNOSIS — E871 Hypo-osmolality and hyponatremia: Secondary | ICD-10-CM | POA: Diagnosis not present

## 2012-02-25 DIAGNOSIS — I10 Essential (primary) hypertension: Secondary | ICD-10-CM | POA: Diagnosis not present

## 2012-02-25 DIAGNOSIS — N179 Acute kidney failure, unspecified: Secondary | ICD-10-CM | POA: Diagnosis not present

## 2012-02-28 DIAGNOSIS — R279 Unspecified lack of coordination: Secondary | ICD-10-CM | POA: Diagnosis not present

## 2012-02-28 DIAGNOSIS — M6281 Muscle weakness (generalized): Secondary | ICD-10-CM | POA: Diagnosis not present

## 2012-02-28 DIAGNOSIS — R471 Dysarthria and anarthria: Secondary | ICD-10-CM | POA: Diagnosis not present

## 2012-02-28 DIAGNOSIS — R488 Other symbolic dysfunctions: Secondary | ICD-10-CM | POA: Diagnosis not present

## 2012-02-28 DIAGNOSIS — R262 Difficulty in walking, not elsewhere classified: Secondary | ICD-10-CM | POA: Diagnosis not present

## 2012-02-28 DIAGNOSIS — I10 Essential (primary) hypertension: Secondary | ICD-10-CM | POA: Diagnosis not present

## 2012-02-29 DIAGNOSIS — E871 Hypo-osmolality and hyponatremia: Secondary | ICD-10-CM | POA: Diagnosis not present

## 2012-02-29 DIAGNOSIS — M6281 Muscle weakness (generalized): Secondary | ICD-10-CM | POA: Diagnosis not present

## 2012-02-29 DIAGNOSIS — R279 Unspecified lack of coordination: Secondary | ICD-10-CM | POA: Diagnosis not present

## 2012-02-29 DIAGNOSIS — R471 Dysarthria and anarthria: Secondary | ICD-10-CM | POA: Diagnosis not present

## 2012-02-29 DIAGNOSIS — R269 Unspecified abnormalities of gait and mobility: Secondary | ICD-10-CM | POA: Diagnosis not present

## 2012-02-29 DIAGNOSIS — F0390 Unspecified dementia without behavioral disturbance: Secondary | ICD-10-CM | POA: Diagnosis not present

## 2012-02-29 DIAGNOSIS — R634 Abnormal weight loss: Secondary | ICD-10-CM | POA: Diagnosis not present

## 2012-02-29 DIAGNOSIS — R488 Other symbolic dysfunctions: Secondary | ICD-10-CM | POA: Diagnosis not present

## 2012-02-29 DIAGNOSIS — D649 Anemia, unspecified: Secondary | ICD-10-CM | POA: Diagnosis not present

## 2012-02-29 DIAGNOSIS — I10 Essential (primary) hypertension: Secondary | ICD-10-CM | POA: Diagnosis not present

## 2012-02-29 DIAGNOSIS — R262 Difficulty in walking, not elsewhere classified: Secondary | ICD-10-CM | POA: Diagnosis not present

## 2012-03-01 DIAGNOSIS — N179 Acute kidney failure, unspecified: Secondary | ICD-10-CM | POA: Diagnosis not present

## 2012-03-01 DIAGNOSIS — Z9181 History of falling: Secondary | ICD-10-CM | POA: Diagnosis not present

## 2012-03-01 DIAGNOSIS — G219 Secondary parkinsonism, unspecified: Secondary | ICD-10-CM | POA: Diagnosis not present

## 2012-03-01 DIAGNOSIS — R29818 Other symptoms and signs involving the nervous system: Secondary | ICD-10-CM | POA: Diagnosis not present

## 2012-03-02 DIAGNOSIS — R29818 Other symptoms and signs involving the nervous system: Secondary | ICD-10-CM | POA: Diagnosis not present

## 2012-03-02 DIAGNOSIS — Z9181 History of falling: Secondary | ICD-10-CM | POA: Diagnosis not present

## 2012-03-02 DIAGNOSIS — G219 Secondary parkinsonism, unspecified: Secondary | ICD-10-CM | POA: Diagnosis not present

## 2012-03-02 DIAGNOSIS — N179 Acute kidney failure, unspecified: Secondary | ICD-10-CM | POA: Diagnosis not present

## 2012-03-03 DIAGNOSIS — N179 Acute kidney failure, unspecified: Secondary | ICD-10-CM | POA: Diagnosis not present

## 2012-03-03 DIAGNOSIS — Z9181 History of falling: Secondary | ICD-10-CM | POA: Diagnosis not present

## 2012-03-03 DIAGNOSIS — G219 Secondary parkinsonism, unspecified: Secondary | ICD-10-CM | POA: Diagnosis not present

## 2012-03-03 DIAGNOSIS — R29818 Other symptoms and signs involving the nervous system: Secondary | ICD-10-CM | POA: Diagnosis not present

## 2012-03-06 DIAGNOSIS — Z9181 History of falling: Secondary | ICD-10-CM | POA: Diagnosis not present

## 2012-03-06 DIAGNOSIS — R29818 Other symptoms and signs involving the nervous system: Secondary | ICD-10-CM | POA: Diagnosis not present

## 2012-03-06 DIAGNOSIS — G219 Secondary parkinsonism, unspecified: Secondary | ICD-10-CM | POA: Diagnosis not present

## 2012-03-06 DIAGNOSIS — N179 Acute kidney failure, unspecified: Secondary | ICD-10-CM | POA: Diagnosis not present

## 2012-03-08 DIAGNOSIS — N179 Acute kidney failure, unspecified: Secondary | ICD-10-CM | POA: Diagnosis not present

## 2012-03-08 DIAGNOSIS — G219 Secondary parkinsonism, unspecified: Secondary | ICD-10-CM | POA: Diagnosis not present

## 2012-03-08 DIAGNOSIS — Z9181 History of falling: Secondary | ICD-10-CM | POA: Diagnosis not present

## 2012-03-08 DIAGNOSIS — R29818 Other symptoms and signs involving the nervous system: Secondary | ICD-10-CM | POA: Diagnosis not present

## 2012-03-09 DIAGNOSIS — N179 Acute kidney failure, unspecified: Secondary | ICD-10-CM | POA: Diagnosis not present

## 2012-03-09 DIAGNOSIS — Z9181 History of falling: Secondary | ICD-10-CM | POA: Diagnosis not present

## 2012-03-09 DIAGNOSIS — I10 Essential (primary) hypertension: Secondary | ICD-10-CM | POA: Diagnosis not present

## 2012-03-09 DIAGNOSIS — G219 Secondary parkinsonism, unspecified: Secondary | ICD-10-CM | POA: Diagnosis not present

## 2012-03-09 DIAGNOSIS — R29818 Other symptoms and signs involving the nervous system: Secondary | ICD-10-CM | POA: Diagnosis not present

## 2012-03-10 DIAGNOSIS — E876 Hypokalemia: Secondary | ICD-10-CM | POA: Diagnosis not present

## 2012-03-10 DIAGNOSIS — N179 Acute kidney failure, unspecified: Secondary | ICD-10-CM | POA: Diagnosis not present

## 2012-03-10 DIAGNOSIS — E871 Hypo-osmolality and hyponatremia: Secondary | ICD-10-CM | POA: Diagnosis not present

## 2012-03-10 DIAGNOSIS — D649 Anemia, unspecified: Secondary | ICD-10-CM | POA: Diagnosis not present

## 2012-03-10 DIAGNOSIS — N400 Enlarged prostate without lower urinary tract symptoms: Secondary | ICD-10-CM | POA: Diagnosis not present

## 2012-03-10 DIAGNOSIS — I10 Essential (primary) hypertension: Secondary | ICD-10-CM | POA: Diagnosis not present

## 2012-03-13 DIAGNOSIS — R29818 Other symptoms and signs involving the nervous system: Secondary | ICD-10-CM | POA: Diagnosis not present

## 2012-03-13 DIAGNOSIS — Z9181 History of falling: Secondary | ICD-10-CM | POA: Diagnosis not present

## 2012-03-13 DIAGNOSIS — N179 Acute kidney failure, unspecified: Secondary | ICD-10-CM | POA: Diagnosis not present

## 2012-03-13 DIAGNOSIS — G219 Secondary parkinsonism, unspecified: Secondary | ICD-10-CM | POA: Diagnosis not present

## 2012-03-15 DIAGNOSIS — N179 Acute kidney failure, unspecified: Secondary | ICD-10-CM | POA: Diagnosis not present

## 2012-03-15 DIAGNOSIS — Z9181 History of falling: Secondary | ICD-10-CM | POA: Diagnosis not present

## 2012-03-15 DIAGNOSIS — R29818 Other symptoms and signs involving the nervous system: Secondary | ICD-10-CM | POA: Diagnosis not present

## 2012-03-15 DIAGNOSIS — G219 Secondary parkinsonism, unspecified: Secondary | ICD-10-CM | POA: Diagnosis not present

## 2012-03-16 DIAGNOSIS — I10 Essential (primary) hypertension: Secondary | ICD-10-CM | POA: Diagnosis not present

## 2012-03-16 DIAGNOSIS — G219 Secondary parkinsonism, unspecified: Secondary | ICD-10-CM | POA: Diagnosis not present

## 2012-03-16 DIAGNOSIS — Z9181 History of falling: Secondary | ICD-10-CM | POA: Diagnosis not present

## 2012-03-16 DIAGNOSIS — R29818 Other symptoms and signs involving the nervous system: Secondary | ICD-10-CM | POA: Diagnosis not present

## 2012-03-16 DIAGNOSIS — N179 Acute kidney failure, unspecified: Secondary | ICD-10-CM | POA: Diagnosis not present

## 2012-03-28 DIAGNOSIS — E871 Hypo-osmolality and hyponatremia: Secondary | ICD-10-CM | POA: Diagnosis not present

## 2012-03-28 DIAGNOSIS — N400 Enlarged prostate without lower urinary tract symptoms: Secondary | ICD-10-CM | POA: Diagnosis not present

## 2012-03-28 DIAGNOSIS — G4700 Insomnia, unspecified: Secondary | ICD-10-CM | POA: Diagnosis not present

## 2012-03-28 DIAGNOSIS — F0390 Unspecified dementia without behavioral disturbance: Secondary | ICD-10-CM | POA: Diagnosis not present

## 2012-03-28 DIAGNOSIS — I10 Essential (primary) hypertension: Secondary | ICD-10-CM | POA: Diagnosis not present

## 2012-03-29 DIAGNOSIS — R509 Fever, unspecified: Secondary | ICD-10-CM | POA: Diagnosis not present

## 2012-03-29 DIAGNOSIS — R300 Dysuria: Secondary | ICD-10-CM | POA: Diagnosis not present

## 2012-03-29 DIAGNOSIS — Z79899 Other long term (current) drug therapy: Secondary | ICD-10-CM | POA: Diagnosis not present

## 2012-03-30 DIAGNOSIS — I10 Essential (primary) hypertension: Secondary | ICD-10-CM | POA: Diagnosis not present

## 2012-05-10 DIAGNOSIS — I10 Essential (primary) hypertension: Secondary | ICD-10-CM | POA: Diagnosis not present

## 2012-05-10 DIAGNOSIS — E871 Hypo-osmolality and hyponatremia: Secondary | ICD-10-CM | POA: Diagnosis not present

## 2012-05-11 DIAGNOSIS — D649 Anemia, unspecified: Secondary | ICD-10-CM | POA: Diagnosis not present

## 2012-05-11 DIAGNOSIS — I10 Essential (primary) hypertension: Secondary | ICD-10-CM | POA: Diagnosis not present

## 2012-05-11 LAB — CBC AND DIFFERENTIAL
Platelets: 365 10*3/uL (ref 150–399)
WBC: 6.1 10^3/mL

## 2012-05-11 LAB — BASIC METABOLIC PANEL
BUN: 34 mg/dL — AB (ref 4–21)
Creatinine: 1.6 mg/dL — AB (ref 0.6–1.3)
Glucose: 84 mg/dL

## 2012-06-06 ENCOUNTER — Encounter: Payer: Self-pay | Admitting: Nurse Practitioner

## 2012-06-06 ENCOUNTER — Non-Acute Institutional Stay (SKILLED_NURSING_FACILITY): Payer: Medicare Other | Admitting: Nurse Practitioner

## 2012-06-06 DIAGNOSIS — N400 Enlarged prostate without lower urinary tract symptoms: Secondary | ICD-10-CM | POA: Insufficient documentation

## 2012-06-06 DIAGNOSIS — E871 Hypo-osmolality and hyponatremia: Secondary | ICD-10-CM

## 2012-06-06 DIAGNOSIS — D649 Anemia, unspecified: Secondary | ICD-10-CM | POA: Diagnosis not present

## 2012-06-06 DIAGNOSIS — I10 Essential (primary) hypertension: Secondary | ICD-10-CM

## 2012-06-06 DIAGNOSIS — F329 Major depressive disorder, single episode, unspecified: Secondary | ICD-10-CM | POA: Diagnosis not present

## 2012-06-06 DIAGNOSIS — F0391 Unspecified dementia with behavioral disturbance: Secondary | ICD-10-CM

## 2012-06-06 DIAGNOSIS — K219 Gastro-esophageal reflux disease without esophagitis: Secondary | ICD-10-CM

## 2012-06-06 DIAGNOSIS — K5900 Constipation, unspecified: Secondary | ICD-10-CM

## 2012-06-06 DIAGNOSIS — F03918 Unspecified dementia, unspecified severity, with other behavioral disturbance: Secondary | ICD-10-CM

## 2012-06-06 DIAGNOSIS — F32A Depression, unspecified: Secondary | ICD-10-CM

## 2012-06-06 HISTORY — DX: Unspecified dementia, unspecified severity, with other behavioral disturbance: F03.918

## 2012-06-06 HISTORY — DX: Depression, unspecified: F32.A

## 2012-06-06 HISTORY — DX: Unspecified dementia with behavioral disturbance: F03.91

## 2012-06-06 NOTE — Assessment & Plan Note (Addendum)
Stable on Protanix 40mg  bid and Zantac 75mg 

## 2012-06-06 NOTE — Assessment & Plan Note (Signed)
Improved now with Myrbetriq, Tamsulosin, and Finasteride

## 2012-06-06 NOTE — Progress Notes (Signed)
Patient ID: Vincent Potts, male   DOB: 03-05-1924, 79 y.o.   MRN: [id number]  Chief Complaint:  Chief Complaint  Patient presents with  . Medical Managment of Chronic Issues     HPI:   The patient has been doing well since admitted to SNF. Acute on chronic renal disease has been stabilized with fluid replete-- Bun/creat 34/1.6 05/11/12. Hgb stable 10.9 05/11/12 with Fe supplement, sleeps much better--able to discontinue his Temazepam with better managing his depression with Effexor and BPH with Tamsulosin, Mrybetriq, and Finasteride. Also his dementia has been stabilized in SNF setting and Namenda. Now sleeps and eats well, interacting and working with staff appropriately. Ambulates with his walker on unit  Review of Systems:  Review of Systems  Constitutional: Negative for fever, chills, weight loss, malaise/fatigue and diaphoresis.  HENT: Positive for hearing loss. Negative for ear pain, congestion, sore throat and neck pain.   Eyes: Negative for blurred vision, double vision, photophobia, pain, discharge and redness.  Respiratory: Negative for cough, sputum production, shortness of breath and wheezing.   Cardiovascular: Negative for chest pain, palpitations, orthopnea, claudication, leg swelling and PND.  Gastrointestinal: Negative for heartburn, nausea, vomiting, abdominal pain, diarrhea and constipation.  Genitourinary: Positive for frequency (much better). Negative for dysuria, urgency and flank pain.  Musculoskeletal: Positive for back pain, joint pain and falls.  Skin: Negative for itching and rash.  Neurological: Negative for dizziness, tremors, speech change, focal weakness, seizures, loss of consciousness and headaches.  Endo/Heme/Allergies: Negative for environmental allergies and polydipsia. Does not bruise/bleed easily.  Psychiatric/Behavioral: Positive for memory loss. Negative for depression and hallucinations. The patient is not nervous/anxious and does not have insomnia.       Medications: Reviewed at [facility]   Physical Exam: Physical Exam  Constitutional: He appears well-developed and well-nourished.  HENT:  Head: Normocephalic and atraumatic.  Eyes: EOM are normal. Pupils are equal, round, and reactive to light.  Neck: Normal range of motion. Neck supple. No JVD present. No thyromegaly present.  Cardiovascular: Normal rate, regular rhythm and normal heart sounds.   No murmur heard. Pulmonary/Chest: Effort normal and breath sounds normal. He has no wheezes. He has no rales.  Abdominal: Soft. Bowel sounds are normal. There is no tenderness.  Musculoskeletal: Normal range of motion. He exhibits no edema.  Scoliokyphosis, walking with walker with SBA  Lymphadenopathy:    He has no cervical adenopathy.  Neurological: He is alert. He displays normal reflexes. No cranial nerve deficit. He exhibits normal muscle tone. Coordination normal.  Skin: Skin is warm and dry. No rash noted. No erythema.  Psychiatric: His mood appears not anxious. His affect is not angry, not blunt and not labile. His speech is not delayed and not slurred. He is not agitated, not aggressive, not slowed, not withdrawn, not actively hallucinating and not combative. Thought content is not paranoid and not delusional. Cognition and memory are impaired. He expresses impulsivity. He does not exhibit a depressed mood. He exhibits abnormal recent memory. He is attentive.     Filed Vitals:   06/06/12 1300  BP: 112/74  Pulse: 77  Temp: 96.9 F (36.1 C)  TempSrc: Tympanic  Resp: 16      Labs reviewed: Basic Metabolic Panel:  Recent Labs  40/98/11 0121 10/24/11 0835  10/24/11 2230 10/25/11 0142  10/27/11 2059 10/28/11 0058 10/28/11 1100 10/28/11 1310  NA 126* 129*  < > 128* 128*  < > 128* 125* 126* 124*  K 3.8 3.9  --   --  4.3  < > 3.7 3.2*  --  3.8  CL 94* 97  --   --  99  < > 98 95*  --  96  CO2 22 17*  --   --  19  < > 21 18*  --  17*  GLUCOSE 96 117*  --   --  87  < >  120* 130*  --  94  BUN 33* 30*  --   --  28*  < > 16 15  --  13  CREATININE 2.26* 1.95*  --   --  2.07*  < > 1.59* 1.50*  --  1.50*  CALCIUM 8.8 8.9  --   --  8.6  < > 8.7 8.3*  --  8.5  MG  --   --   --   --  2.0  --   --   --   --   --   PHOS  --   --   --   --  3.1  --   --   --   --   --   TSH  --  0.932  --   --   --   --   --   --   --   --   < > = values in this interval not displayed.  Liver Function Tests:  Recent Labs  10/24/11 0121 10/24/11 0835  AST 9 15  ALT 6 7  ALKPHOS 53 60  BILITOT 0.1* 0.1*  PROT 5.8* 6.3  ALBUMIN 2.7* 2.8*    CBC:  Recent Labs  10/24/11 0121  10/24/11 2230 10/25/11 0142 10/26/11 0823 10/28/11 1310  WBC 7.6  < > 6.9 6.5 7.5  --   NEUTROABS 4.6  --   --   --   --   --   HGB 7.6*  < > 9.7* 9.6* 10.2* 10.2*  HCT 22.2*  < > 27.6* 27.6* 29.5* 28.8*  MCV 86.7  < > 85.7 85.7 87.8  --   PLT 242  < > 247 228 244  --   < > = values in this interval not displayed.  Significant Diagnostic Results: 10/24/11 CT head w/o cm IMPRESSION: Volume loss, white matter changes, and remote left caudate lacunar infarction.  No acute intracranial abnormality identified.   Assessment/Plan No problem-specific assessment & plan notes found for this encounter.     Family/ staff Communication: safety due to fall risk.    Goals of care:  Continue SNF   Labs/tests ordered none

## 2012-06-06 NOTE — Assessment & Plan Note (Signed)
Controlled on Amlodipine 5mg   

## 2012-06-06 NOTE — Assessment & Plan Note (Signed)
Stable, Hgb 10.6 05/11/12, continue Fe

## 2012-06-06 NOTE — Assessment & Plan Note (Signed)
Much improved on Demeclocycline, last serum Na 133.

## 2012-06-06 NOTE — Assessment & Plan Note (Signed)
Stabilized in SNF and Namenda                     

## 2012-06-06 NOTE — Assessment & Plan Note (Signed)
Improved on Effexor 75mg   

## 2012-06-06 NOTE — Assessment & Plan Note (Signed)
Stable on Senna II bid.  

## 2012-08-03 DIAGNOSIS — I10 Essential (primary) hypertension: Secondary | ICD-10-CM | POA: Diagnosis not present

## 2012-08-03 DIAGNOSIS — D649 Anemia, unspecified: Secondary | ICD-10-CM | POA: Diagnosis not present

## 2012-08-03 LAB — BASIC METABOLIC PANEL
Creatinine: 1.7 mg/dL — AB (ref 0.6–1.3)
Glucose: 81 mg/dL
Potassium: 3.6 mmol/L (ref 3.4–5.3)

## 2012-08-03 LAB — CBC AND DIFFERENTIAL: WBC: 6.1 10^3/mL

## 2012-08-07 DIAGNOSIS — N390 Urinary tract infection, site not specified: Secondary | ICD-10-CM | POA: Diagnosis not present

## 2012-08-29 ENCOUNTER — Non-Acute Institutional Stay (SKILLED_NURSING_FACILITY): Payer: Medicare Other | Admitting: Nurse Practitioner

## 2012-08-29 DIAGNOSIS — N400 Enlarged prostate without lower urinary tract symptoms: Secondary | ICD-10-CM | POA: Diagnosis not present

## 2012-08-29 DIAGNOSIS — E871 Hypo-osmolality and hyponatremia: Secondary | ICD-10-CM | POA: Diagnosis not present

## 2012-08-29 DIAGNOSIS — I10 Essential (primary) hypertension: Secondary | ICD-10-CM | POA: Diagnosis not present

## 2012-08-29 DIAGNOSIS — K219 Gastro-esophageal reflux disease without esophagitis: Secondary | ICD-10-CM

## 2012-08-29 DIAGNOSIS — D649 Anemia, unspecified: Secondary | ICD-10-CM

## 2012-08-29 DIAGNOSIS — F0391 Unspecified dementia with behavioral disturbance: Secondary | ICD-10-CM

## 2012-08-29 DIAGNOSIS — N183 Chronic kidney disease, stage 3 unspecified: Secondary | ICD-10-CM

## 2012-08-29 DIAGNOSIS — N184 Chronic kidney disease, stage 4 (severe): Secondary | ICD-10-CM | POA: Insufficient documentation

## 2012-08-29 DIAGNOSIS — F03918 Unspecified dementia, unspecified severity, with other behavioral disturbance: Secondary | ICD-10-CM

## 2012-08-29 HISTORY — DX: Chronic kidney disease, stage 3 unspecified: N18.30

## 2012-08-29 NOTE — Assessment & Plan Note (Signed)
Baseline creatinine 1.5-2.0 

## 2012-08-29 NOTE — Assessment & Plan Note (Signed)
Improved initially with Myrbetriq, Tamsulosin, and Finasteride. Now c/o urinary frequency w/o dysuria, able to return to sleep after bathroom trips. Urine culture showed no growth 08/08/12--will repeat Cath UA and C/S and refer to Urology if UA negative for UTI.

## 2012-08-29 NOTE — Assessment & Plan Note (Signed)
Stable, Hgb 10.6 05/11/12 and 11.1 08/03/12, continue Fe

## 2012-08-29 NOTE — Assessment & Plan Note (Signed)
Stable on Protanix 40mg  bid and Zantac 75mg 

## 2012-08-29 NOTE — Assessment & Plan Note (Signed)
Stabilized in SNF and Namenda                     

## 2012-08-29 NOTE — Assessment & Plan Note (Signed)
Controlled on Amlodipine 5mg   

## 2012-08-29 NOTE — Assessment & Plan Note (Signed)
Much improved on Demeclocycline, last serum Na 131

## 2012-08-29 NOTE — Progress Notes (Signed)
Patient ID: Vincent Potts, male   DOB: 26-Nov-1924, 79 y.o.   MRN: [id number]  Chief Complaint:  Chief Complaint  Patient presents with  . Medical Managment of Chronic Issues    urinary frequency     HPI:   Problem List Items Addressed This Visit   Anemia     Stable, Hgb 10.6 05/11/12 and 11.1 08/03/12, continue Fe      BPH (benign prostatic hyperplasia) - Primary     Improved initially with Myrbetriq, Tamsulosin, and Finasteride. Now c/o urinary frequency w/o dysuria, able to return to sleep after bathroom trips. Urine culture showed no growth 08/08/12--will repeat Cath UA and C/S and refer to Urology if UA negative for UTI.       Dementia with behavioral disturbance     Stabilized in SNF and [facility]      GERD (gastroesophageal reflux disease)     Stable on Protanix 40mg  bid and Zantac 75mg       Hypertension     Controlled on Amlodipine 5mg        Hyponatremia     Much improved on Demeclocycline, last serum Na 131         Review of Systems:  Review of Systems  Constitutional: Negative for fever, chills, weight loss, malaise/fatigue and diaphoresis.  HENT: Positive for hearing loss. Negative for ear pain, congestion, sore throat and neck pain.   Eyes: Negative for blurred vision, double vision, photophobia, pain, discharge and redness.  Respiratory: Negative for cough, sputum production, shortness of breath and wheezing.   Cardiovascular: Negative for chest pain, palpitations, orthopnea, claudication, leg swelling and PND.  Gastrointestinal: Negative for heartburn, nausea, vomiting, abdominal pain, diarrhea and constipation.  Genitourinary: Positive for frequency (much better-relapsed). Negative for dysuria, urgency and flank pain.  Musculoskeletal: Positive for back pain, joint pain and falls.  Skin: Negative for itching and rash.  Neurological: Negative for dizziness, tremors, speech change, focal weakness, seizures, loss of consciousness and headaches.   Endo/Heme/Allergies: Negative for environmental allergies and polydipsia. Does not bruise/bleed easily.  Psychiatric/Behavioral: Positive for memory loss. Negative for depression and hallucinations. The patient is not nervous/anxious and does not have insomnia.     Medications: Patient's Medications  New Prescriptions   No medications on file  Previous Medications   ACETAMINOPHEN (TYLENOL) 500 MG TABLET    Take 500 mg by mouth at bedtime. To be given with Restoril   AMLODIPINE (NORVASC) 5 MG TABLET    Take 1 tablet (5 mg total) by mouth every morning.   BESIFLOXACIN HCL (BESIVANCE OP)    Place 1 drop into the left eye 3 (three) times daily. Beginning 8/13-13 Until bottle is depleted   CARBOXYMETHYLCELLULOSE 1 % OPHTHALMIC SOLUTION    Apply 1 drop to eye 4 (four) times daily.   CHOLECALCIFEROL (VITAMIN D) 1000 UNITS TABLET    Take 1,000 Units by mouth every morning.    CYCLOSPORINE (RESTASIS) 0.05 % OPHTHALMIC EMULSION    Place 1 drop into both eyes 2 (two) times daily.    FEEDING SUPPLEMENT (ENSURE CLINICAL STRENGTH) LIQD    Take 237 mLs by mouth 2 (two) times daily with a meal.   FERROUS SULFATE 325 (65 FE) MG TABLET    Take 325 mg by mouth 2 (two) times daily.    FINASTERIDE (PROSCAR) 5 MG TABLET    Take 5 mg by mouth daily.   HYPROMELLOSE (GENTEAL OP)    Apply 1 drop to eye at bedtime.   MAGNESIUM HYDROXIDE (MILK OF  MAGNESIA) 400 MG/5ML SUSPENSION    Take 30 mLs by mouth daily as needed. Please take 30 ml by mouth for the next three days (util 8/31) then you can take as needed for constipation.   OPHTHALMIC IRRIGATION SOLUTION (OCUSOFT EYE WASH OP)    Apply 1 application to eye 2 (two) times daily. For 2 weeks beginning 10/12/11   OXYCODONE (OXY-IR) 5 MG CAPSULE    Take 5 mg by mouth every 4 (four) hours as needed. pain   PANTOPRAZOLE (PROTONIX) 40 MG TABLET    Take 1 tablet (40 mg total) by mouth 2 (two) times daily.   RANITIDINE (ZANTAC) 150 MG TABLET    Take 150 mg by mouth at bedtime.    SENNA (SENOKOT) 8.6 MG TABLET    Take 1 tablet by mouth 2 (two) times daily.    SIMETHICONE (MYLICON) 80 MG CHEWABLE TABLET    Chew 80 mg by mouth 4 (four) times daily -  before meals and at bedtime.   TAMSULOSIN HCL (FLOMAX) 0.4 MG CAPS    Take 0.4 mg by mouth every morning.    TEMAZEPAM (RESTORIL) 30 MG CAPSULE    Take 30 mg by mouth at bedtime. For insomnia  Modified Medications   No medications on file  Discontinued Medications   No medications on file     Physical Exam: Physical Exam  Constitutional: He appears well-developed and well-nourished.  HENT:  Head: Normocephalic and atraumatic.  Eyes: EOM are normal. Pupils are equal, round, and reactive to light.  Neck: Normal range of motion. Neck supple. No JVD present. No thyromegaly present.  Cardiovascular: Normal rate, regular rhythm and normal heart sounds.   No murmur heard. Pulmonary/Chest: Effort normal and breath sounds normal. He has no wheezes. He has no rales.  Abdominal: Soft. Bowel sounds are normal. There is no tenderness.  Musculoskeletal: Normal range of motion. He exhibits no edema.  Scoliokyphosis, walking with walker with SBA  Lymphadenopathy:    He has no cervical adenopathy.  Neurological: He is alert. He displays normal reflexes. No cranial nerve deficit. He exhibits normal muscle tone. Coordination normal.  Skin: Skin is warm and dry. No rash noted. No erythema.  Psychiatric: His mood appears not anxious. His affect is not angry, not blunt and not labile. His speech is not delayed and not slurred. He is not agitated, not aggressive, not slowed, not withdrawn, not actively hallucinating and not combative. Thought content is not paranoid and not delusional. Cognition and memory are impaired. He expresses impulsivity. He does not exhibit a depressed mood. He exhibits abnormal recent memory. He is attentive.     Filed Vitals:   08/29/12 1644  BP: 130/81  Pulse: 75  Temp: 97.8 F (36.6 C)  TempSrc:  Tympanic  Resp: 20      Labs reviewed: Basic Metabolic Panel:  Recent Labs  04/54/09 2230 10/25/11 0142  10/27/11 2059 10/28/11 0058  10/28/11 1310 01/13/12 05/11/12 08/03/12  NA 128* 128*  < > 128* 125*  < > 124*  --  133* 131*  K  --  4.3  < > 3.7 3.2*  --  3.8  --  3.8 3.6  CL  --  99  < > 98 95*  --  96  --   --   --   CO2  --  19  < > 21 18*  --  17*  --   --   --   GLUCOSE  --  87  < >  120* 130*  --  94  --   --   --   BUN  --  28*  < > 16 15  --  13  --  34* 28*  CREATININE  --  2.07*  < > 1.59* 1.50*  --  1.50*  --  1.6* 1.7*  CALCIUM  --  8.6  < > 8.7 8.3*  --  8.5  --   --   --   MG  --  2.0  --   --   --   --   --   --   --   --   PHOS  --  3.1  --   --   --   --   --   --   --   --   TSH  --   --   --   --   --   --   --  1.28  --   --   < > = values in this interval not displayed.  Liver Function Tests:  Recent Labs  10/24/11 0121 10/24/11 0835  AST 9 15  ALT 6 7  ALKPHOS 53 60  BILITOT 0.1* 0.1*  PROT 5.8* 6.3  ALBUMIN 2.7* 2.8*    CBC:  Recent Labs  10/24/11 0121  10/24/11 2230 10/25/11 0142 10/26/11 0823 10/28/11 1310 05/11/12 08/03/12  WBC 7.6  < > 6.9 6.5 7.5  --  6.1 6.1  NEUTROABS 4.6  --   --   --   --   --   --   --   HGB 7.6*  < > 9.7* 9.6* 10.2* 10.2* 10.6* 11.1*  HCT 22.2*  < > 27.6* 27.6* 29.5* 28.8* 31* 33*  MCV 86.7  < > 85.7 85.7 87.8  --   --   --   PLT 242  < > 247 228 244  --  365 227  < > = values in this interval not displayed.  Anemia Panel: No results found for this basename: IRON, FOLATE, VITAMINB12,  in the last 8760 hours  Significant Diagnostic Results:     Assessment/Plan BPH (benign prostatic hyperplasia) Improved initially with Myrbetriq, Tamsulosin, and Finasteride. Now c/o urinary frequency w/o dysuria, able to return to sleep after bathroom trips. Urine culture showed no growth 08/08/12--will repeat Cath UA and C/S and refer to Urology if UA negative for UTI.     Hypertension Controlled on  Amlodipine 5mg      Hyponatremia Much improved on Demeclocycline, last serum Na 131    GERD (gastroesophageal reflux disease) Stable on Protanix 40mg  bid and Zantac 75mg     Anemia Stable, Hgb 10.6 05/11/12 and 11.1 08/03/12, continue Fe    Dementia with behavioral disturbance Stabilized in SNF and [facility]    Chronic kidney disease Baseline creatinine 1.5-2.0      Family/ staff Communication: observe the patient  Goals of care: SNF   Labs/tests ordered UA and C/S

## 2012-08-30 DIAGNOSIS — N390 Urinary tract infection, site not specified: Secondary | ICD-10-CM | POA: Diagnosis not present

## 2012-09-13 DIAGNOSIS — R350 Frequency of micturition: Secondary | ICD-10-CM | POA: Diagnosis not present

## 2012-09-13 DIAGNOSIS — R351 Nocturia: Secondary | ICD-10-CM | POA: Diagnosis not present

## 2012-10-03 ENCOUNTER — Encounter: Payer: Self-pay | Admitting: Nurse Practitioner

## 2012-10-03 ENCOUNTER — Non-Acute Institutional Stay (SKILLED_NURSING_FACILITY): Payer: Medicare Other | Admitting: Nurse Practitioner

## 2012-10-03 DIAGNOSIS — N183 Chronic kidney disease, stage 3 unspecified: Secondary | ICD-10-CM

## 2012-10-03 DIAGNOSIS — K219 Gastro-esophageal reflux disease without esophagitis: Secondary | ICD-10-CM

## 2012-10-03 DIAGNOSIS — R945 Abnormal results of liver function studies: Secondary | ICD-10-CM

## 2012-10-03 DIAGNOSIS — F03918 Unspecified dementia, unspecified severity, with other behavioral disturbance: Secondary | ICD-10-CM

## 2012-10-03 DIAGNOSIS — D649 Anemia, unspecified: Secondary | ICD-10-CM

## 2012-10-03 DIAGNOSIS — F3289 Other specified depressive episodes: Secondary | ICD-10-CM

## 2012-10-03 DIAGNOSIS — R7989 Other specified abnormal findings of blood chemistry: Secondary | ICD-10-CM | POA: Diagnosis not present

## 2012-10-03 DIAGNOSIS — F0391 Unspecified dementia with behavioral disturbance: Secondary | ICD-10-CM

## 2012-10-03 DIAGNOSIS — E876 Hypokalemia: Secondary | ICD-10-CM

## 2012-10-03 DIAGNOSIS — N400 Enlarged prostate without lower urinary tract symptoms: Secondary | ICD-10-CM | POA: Diagnosis not present

## 2012-10-03 DIAGNOSIS — K5900 Constipation, unspecified: Secondary | ICD-10-CM

## 2012-10-03 DIAGNOSIS — E871 Hypo-osmolality and hyponatremia: Secondary | ICD-10-CM

## 2012-10-03 DIAGNOSIS — I10 Essential (primary) hypertension: Secondary | ICD-10-CM

## 2012-10-03 DIAGNOSIS — F329 Major depressive disorder, single episode, unspecified: Secondary | ICD-10-CM

## 2012-10-03 NOTE — Assessment & Plan Note (Signed)
Stable on Protanix 40mg bid and Zantac 75mg 

## 2012-10-03 NOTE — Assessment & Plan Note (Signed)
Managed with Senokot S II bid.    

## 2012-10-03 NOTE — Assessment & Plan Note (Signed)
Improved with Myrbetriq, Tamsulosin, and Finasteride. UA ruled out UTI

## 2012-10-03 NOTE — Assessment & Plan Note (Signed)
Much improved on Demeclocycline, last serum Na 131 08/03/12, update BMP

## 2012-10-03 NOTE — Progress Notes (Signed)
Patient ID: Vincent Potts, male   DOB: December 13, 1924, 79 y.o.   MRN: [id number] Code Status: DNR  Allergies  Allergen Reactions  . Olanzapine Other (See Comments)    unknown  . Rivastigmine Nausea Only    Chief Complaint  Patient presents with  . Medical Managment of Chronic Issues    HPI: Patient is a 79 y.o. male seen in the SNF at [facility] today for evaluation of his chronic medical conditions.  Problem List Items Addressed This Visit   Abnormal LFTs - Primary     Normalized per LFT 08/03/12 except total protein 5.9    Anemia     Stable, Hgb 10.6 05/11/12 and 11.1 08/03/12, continue Fe, update CBC        BPH (benign prostatic hyperplasia)     Improved with Myrbetriq, Tamsulosin, and Finasteride. UA ruled out UTI        Chronic kidney disease     Baseline creatinine 1.5-2.0      Dementia with behavioral disturbance     Stabilized in SNF and [facility]        Relevant Medications      venlafaxine (EFFEXOR) 75 MG tablet      memantine (NAMENDA) 10 MG tablet   Depression     Improved on Effexor 75mg       Relevant Medications      venlafaxine (EFFEXOR) 75 MG tablet   GERD (gastroesophageal reflux disease)     Stable on Protanix 40mg  bid and Zantac 75mg         Relevant Medications      pantoprazole (PROTONIX) 40 MG tablet   Hypertension     Controlled on Amlodipine 5mg          Hypokalemia     Supplemented with Kcl 73meq--update BMP    Hyponatremia     Much improved on Demeclocycline, last serum Na 131 08/03/12, update BMP        Unspecified constipation     Managed with Senokot S II bid       Review of Systems:  Review of Systems  Constitutional: Negative for fever, chills, weight loss, malaise/fatigue and diaphoresis.  HENT: Positive for hearing loss. Negative for ear pain, congestion, sore throat and neck pain.   Eyes: Negative for blurred vision, double vision, photophobia, pain, discharge and redness.  Respiratory: Negative for  cough, sputum production, shortness of breath and wheezing.   Cardiovascular: Negative for chest pain, palpitations, orthopnea, claudication, leg swelling and PND.  Gastrointestinal: Negative for heartburn, nausea, vomiting, abdominal pain, diarrhea and constipation.  Genitourinary: Negative for dysuria, urgency and flank pain. Frequency: much better.  Musculoskeletal: Positive for back pain and joint pain. Negative for falls.  Skin: Negative for itching and rash.  Neurological: Negative for dizziness, tremors, speech change, focal weakness, seizures, loss of consciousness and headaches.  Endo/Heme/Allergies: Negative for environmental allergies and polydipsia. Does not bruise/bleed easily.  Psychiatric/Behavioral: Positive for memory loss. Negative for depression and hallucinations. The patient is not nervous/anxious and does not have insomnia.      Past Medical History  Diagnosis Date  . Thyroid disease   . Anemia   . Vitamin D deficiency disease   . Hyponatremia   . Anxiety   . Parkinsonism   . Hearing loss   . Hypertension   . PVC (premature ventricular contraction)   . CVA (cerebral infarction)   . GERD (gastroesophageal reflux disease)   . Diverticulosis   . Sciatica   . Back pain   .  Gait disorder   . Cataract   . Edema   . Keratosis   . Sciatica   . Cystic disease of liver   . Paresthesia   . Inguinal hernia   . Hearing loss   . Stroke 2010    no deficits  . Chronic kidney disease 2013    rhabdo  . H/O hiatal hernia 2008    repaired  . Depression 06/06/2012  . Dementia with behavioral disturbance 06/06/2012   Past Surgical History  Procedure Laterality Date  . Prostate surgery      cancer  . Hernia repair    . Appendectomy    . Esophagogastroduodenoscopy  10/25/2011    Procedure: ESOPHAGOGASTRODUODENOSCOPY (EGD);  Surgeon: Vertell Novak., MD;  Location: [facility] ENDOSCOPY;  Service: Endoscopy;  Laterality: N/A;   Social History:   reports that he has never  smoked. He does not have any smokeless tobacco history on file. He reports that  drinks alcohol. He reports that he does not use illicit drugs.  Family History  Problem Relation Age of Onset  . Hypertension Mother     Medications: Patient's Medications  New Prescriptions   No medications on file  Previous Medications   ACETAMINOPHEN (TYLENOL) 500 MG TABLET    Take 500 mg by mouth at bedtime. To be given with Restoril   AMLODIPINE (NORVASC) 5 MG TABLET    Take 1 tablet (5 mg total) by mouth every morning.   CARBOXYMETHYLCELLULOSE 1 % OPHTHALMIC SOLUTION    Apply 1 drop to eye 4 (four) times daily.   CHOLECALCIFEROL (VITAMIN D) 1000 UNITS TABLET    Take 1,000 Units by mouth every morning.    CYCLOSPORINE (RESTASIS) 0.05 % OPHTHALMIC EMULSION    Place 1 drop into both eyes 2 (two) times daily.    DEMECLOCYCLINE (DECLOMYCIN) 150 MG TABLET    Take 150 mg by mouth 2 (two) times daily.   FEEDING SUPPLEMENT (ENSURE CLINICAL STRENGTH) LIQD    Take 237 mLs by mouth 2 (two) times daily with a meal.   FERROUS SULFATE 325 (65 FE) MG TABLET    Take 325 mg by mouth 2 (two) times daily.    FINASTERIDE (PROSCAR) 5 MG TABLET    Take 5 mg by mouth daily.   HYPROMELLOSE (GENTEAL OP)    Apply 1 drop to eye at bedtime.   MAGNESIUM HYDROXIDE (MILK OF MAGNESIA) 400 MG/5ML SUSPENSION    Take 30 mLs by mouth daily as needed. Please take 30 ml by mouth for the next three days (util 8/31) then you can take as needed for constipation.   MEMANTINE (NAMENDA) 10 MG TABLET    Take 28 mg by mouth daily.   OPHTHALMIC IRRIGATION SOLUTION (OCUSOFT EYE WASH OP)    Apply 1 application to eye 2 (two) times daily. For 2 weeks beginning 10/12/11   OXYCODONE (OXY-IR) 5 MG CAPSULE    Take 5 mg by mouth every 4 (four) hours as needed. pain   POTASSIUM CHLORIDE 20 MEQ/15ML (10%) SOLUTION    Take 20 mEq by mouth daily.   RANITIDINE (ZANTAC) 150 MG TABLET    Take 150 mg by mouth at bedtime.   SENNA (SENOKOT) 8.6 MG TABLET    Take 1  tablet by mouth 2 (two) times daily.    SIMETHICONE (MYLICON) 80 MG CHEWABLE TABLET    Chew 80 mg by mouth 4 (four) times daily -  before meals and at bedtime.   TAMSULOSIN HCL (FLOMAX) 0.4 MG  CAPS    Take 0.4 mg by mouth every morning.    VENLAFAXINE (EFFEXOR) 75 MG TABLET    Take 75 mg by mouth daily.  Modified Medications   Modified Medication Previous Medication   PANTOPRAZOLE (PROTONIX) 40 MG TABLET pantoprazole (PROTONIX) 40 MG tablet      Take 40 mg by mouth daily.    Take 1 tablet (40 mg total) by mouth 2 (two) times daily.  Discontinued Medications   BESIFLOXACIN HCL (BESIVANCE OP)    Place 1 drop into the left eye 3 (three) times daily. Beginning 8/13-13 Until bottle is depleted   TEMAZEPAM (RESTORIL) 30 MG CAPSULE    Take 30 mg by mouth at bedtime. For insomnia     Physical Exam: Physical Exam  Constitutional: He appears well-developed and well-nourished.  HENT:  Head: Normocephalic and atraumatic.  Eyes: EOM are normal. Pupils are equal, round, and reactive to light.  Neck: Normal range of motion. Neck supple. No JVD present. No thyromegaly present.  Cardiovascular: Normal rate, regular rhythm and normal heart sounds.   No murmur heard. Pulmonary/Chest: Effort normal and breath sounds normal. He has no wheezes. He has no rales.  Abdominal: Soft. Bowel sounds are normal. There is no tenderness.  Musculoskeletal: Normal range of motion. He exhibits no edema.  Scoliokyphosis, walking with walker with SBA  Lymphadenopathy:    He has no cervical adenopathy.  Neurological: He is alert. He displays normal reflexes. No cranial nerve deficit. He exhibits normal muscle tone. Coordination normal.  Skin: Skin is warm and dry. No rash noted. No erythema.  Psychiatric: His mood appears not anxious. His affect is not angry, not blunt and not labile. His speech is not delayed and not slurred. He is not agitated, not aggressive, not slowed, not withdrawn, not actively hallucinating and not  combative. Thought content is not paranoid and not delusional. Cognition and memory are impaired. He expresses impulsivity. He does not exhibit a depressed mood. He exhibits abnormal recent memory. He is attentive.    Filed Vitals:   10/03/12 1305  BP: 136/82  Pulse: 80  Temp: 96.7 F (35.9 C)  TempSrc: Tympanic  Resp: 20      Labs reviewed: Basic Metabolic Panel:  Recent Labs  16/10/96 2230 10/25/11 0142  10/27/11 2059 10/28/11 0058  10/28/11 1310 01/13/12 05/11/12 08/03/12  NA 128* 128*  < > 128* 125*  < > 124*  --  133* 131*  K  --  4.3  < > 3.7 3.2*  --  3.8  --  3.8 3.6  CL  --  99  < > 98 95*  --  96  --   --   --   CO2  --  19  < > 21 18*  --  17*  --   --   --   GLUCOSE  --  87  < > 120* 130*  --  94  --   --   --   BUN  --  28*  < > 16 15  --  13  --  34* 28*  CREATININE  --  2.07*  < > 1.59* 1.50*  --  1.50*  --  1.6* 1.7*  CALCIUM  --  8.6  < > 8.7 8.3*  --  8.5  --   --   --   MG  --  2.0  --   --   --   --   --   --   --   --  PHOS  --  3.1  --   --   --   --   --   --   --   --   TSH  --   --   --   --   --   --   --  1.28  --   --   < > = values in this interval not displayed. Liver Function Tests:  Recent Labs  10/24/11 0121 10/24/11 0835  AST 9 15  ALT 6 7  ALKPHOS 53 60  BILITOT 0.1* 0.1*  PROT 5.8* 6.3  ALBUMIN 2.7* 2.8*   CBC:  Recent Labs  10/24/11 0121  10/24/11 2230 10/25/11 0142 10/26/11 0823 10/28/11 1310 05/11/12 08/03/12  WBC 7.6  < > 6.9 6.5 7.5  --  6.1 6.1  NEUTROABS 4.6  --   --   --   --   --   --   --   HGB 7.6*  < > 9.7* 9.6* 10.2* 10.2* 10.6* 11.1*  HCT 22.2*  < > 27.6* 27.6* 29.5* 28.8* 31* 33*  MCV 86.7  < > 85.7 85.7 87.8  --   --   --   PLT 242  < > 247 228 244  --  365 227  < > = values in this interval not displayed.      Assessment/Plan Abnormal LFTs Normalized per LFT 08/03/12 except total protein 5.9  Anemia Stable, Hgb 10.6 05/11/12 and 11.1 08/03/12, continue Fe, update CBC      BPH (benign  prostatic hyperplasia) Improved with Myrbetriq, Tamsulosin, and Finasteride. UA ruled out UTI      Chronic kidney disease Baseline creatinine 1.5-2.0    GERD (gastroesophageal reflux disease) Stable on Protanix 40mg  bid and Zantac 75mg       Hypertension Controlled on Amlodipine 5mg        Hyponatremia Much improved on Demeclocycline, last serum Na 131 08/03/12, update BMP      Unspecified constipation Managed with Senokot S II bid  Hypokalemia Supplemented with Kcl 23meq--update BMP  Depression Improved on Effexor 75mg     Dementia with behavioral disturbance Stabilized in SNF and [facility]        Family/ Staff Communication:  Observe the patient.   Goals of Care: SNF  Labs/tests ordered: CBC and BMP

## 2012-10-03 NOTE — Assessment & Plan Note (Signed)
Normalized per LFT 08/03/12 except total protein 5.9

## 2012-10-03 NOTE — Assessment & Plan Note (Addendum)
Stable, Hgb 10.6 05/11/12 and 11.1 08/03/12, continue Fe, update CBC

## 2012-10-03 NOTE — Assessment & Plan Note (Signed)
Controlled on Amlodipine 5mg   

## 2012-10-03 NOTE — Assessment & Plan Note (Signed)
Baseline creatinine 1.5-2.0 

## 2012-10-03 NOTE — Assessment & Plan Note (Signed)
Stabilized in SNF and Namenda                     

## 2012-10-03 NOTE — Assessment & Plan Note (Signed)
Supplemented with Kcl 11meq--update BMP

## 2012-10-03 NOTE — Assessment & Plan Note (Signed)
Improved on Effexor 75mg   

## 2012-10-05 DIAGNOSIS — I10 Essential (primary) hypertension: Secondary | ICD-10-CM | POA: Diagnosis not present

## 2012-10-05 DIAGNOSIS — D649 Anemia, unspecified: Secondary | ICD-10-CM | POA: Diagnosis not present

## 2012-10-05 LAB — BASIC METABOLIC PANEL
BUN: 34 mg/dL — AB (ref 4–21)
Glucose: 79 mg/dL
Potassium: 3.8 mmol/L (ref 3.4–5.3)
Sodium: 135 mmol/L — AB (ref 137–147)

## 2012-11-03 ENCOUNTER — Encounter: Payer: Self-pay | Admitting: Nurse Practitioner

## 2012-11-03 ENCOUNTER — Non-Acute Institutional Stay (SKILLED_NURSING_FACILITY): Payer: Medicare Other | Admitting: Nurse Practitioner

## 2012-11-03 DIAGNOSIS — D649 Anemia, unspecified: Secondary | ICD-10-CM

## 2012-11-03 DIAGNOSIS — N179 Acute kidney failure, unspecified: Secondary | ICD-10-CM

## 2012-11-03 DIAGNOSIS — F329 Major depressive disorder, single episode, unspecified: Secondary | ICD-10-CM

## 2012-11-03 DIAGNOSIS — N400 Enlarged prostate without lower urinary tract symptoms: Secondary | ICD-10-CM

## 2012-11-03 DIAGNOSIS — F0391 Unspecified dementia with behavioral disturbance: Secondary | ICD-10-CM | POA: Diagnosis not present

## 2012-11-03 DIAGNOSIS — E871 Hypo-osmolality and hyponatremia: Secondary | ICD-10-CM

## 2012-11-03 DIAGNOSIS — K219 Gastro-esophageal reflux disease without esophagitis: Secondary | ICD-10-CM

## 2012-11-03 DIAGNOSIS — I10 Essential (primary) hypertension: Secondary | ICD-10-CM

## 2012-11-03 DIAGNOSIS — K5900 Constipation, unspecified: Secondary | ICD-10-CM

## 2012-11-03 NOTE — Assessment & Plan Note (Signed)
Managed with Senokot S II bid.    

## 2012-11-03 NOTE — Assessment & Plan Note (Signed)
Baseline creatinine 1.5-2.0 

## 2012-11-03 NOTE — Assessment & Plan Note (Signed)
Improved with Myrbetriq, Tamsulosin, and Finasteride. 2-3 x/night and no trouble if returning to sleep.

## 2012-11-03 NOTE — Assessment & Plan Note (Signed)
Controlled on Amlodipine 5mg   

## 2012-11-03 NOTE — Assessment & Plan Note (Signed)
Much improved on Demeclocycline, last serum Na 131 08/03/12-135 10/05/12

## 2012-11-03 NOTE — Progress Notes (Signed)
Patient ID: Vincent Potts, male   DOB: 12/29/1924, 79 y.o.   MRN: [id number] Code Status: DNR  Allergies  Allergen Reactions  . Olanzapine Other (See Comments)    unknown  . Rivastigmine Nausea Only    Chief Complaint  Patient presents with  . Medical Managment of Chronic Issues    HPI: Patient is a 79 y.o. male seen in the SNF at [facility] today for evaluation of his chronic medical conditions.  Problem List Items Addressed This Visit   Acute renal failure     Baseline creatinine 1.5-2.0        Anemia     Stable, Hgb 10.6 05/11/12 and 11.1 08/03/12 and 11.4 10/05/12, dc Fe and update CBC ine one month.          BPH (benign prostatic hyperplasia) - Primary     Improved with Myrbetriq, Tamsulosin, and Finasteride. 2-3 x/night and no trouble if returning to sleep.          Dementia with behavioral disturbance     Stabilized in SNF and [facility]          Depression     Improved on Effexor 75mg         GERD (gastroesophageal reflux disease)     Stable on Protanix 40mg  bid and Zantac 75mg           Hypertension     Controlled on Amlodipine 5mg            Hyponatremia     Much improved on Demeclocycline, last serum Na 131 08/03/12-135 10/05/12          Unspecified constipation     Managed with Senokot S II bid         Review of Systems:  Review of Systems  Constitutional: Negative for fever, chills, weight loss, malaise/fatigue and diaphoresis.  HENT: Positive for hearing loss. Negative for ear pain, congestion, sore throat and neck pain.   Eyes: Negative for blurred vision, double vision, photophobia, pain, discharge and redness.  Respiratory: Negative for cough, sputum production, shortness of breath and wheezing.   Cardiovascular: Negative for chest pain, palpitations, orthopnea, claudication, leg swelling and PND.  Gastrointestinal: Negative for heartburn, nausea, vomiting, abdominal pain, diarrhea and constipation.   Genitourinary: Negative for dysuria, urgency and flank pain. Frequency: much better.  Musculoskeletal: Positive for back pain and joint pain. Negative for falls.  Skin: Negative for itching and rash.  Neurological: Negative for dizziness, tremors, speech change, focal weakness, seizures, loss of consciousness and headaches.  Endo/Heme/Allergies: Negative for environmental allergies and polydipsia. Does not bruise/bleed easily.  Psychiatric/Behavioral: Positive for memory loss. Negative for depression and hallucinations. The patient is not nervous/anxious and does not have insomnia.      Past Medical History  Diagnosis Date  . Thyroid disease   . Anemia   . Vitamin D deficiency disease   . Hyponatremia   . Anxiety   . Parkinsonism   . Hearing loss   . Hypertension   . PVC (premature ventricular contraction)   . CVA (cerebral infarction)   . GERD (gastroesophageal reflux disease)   . Diverticulosis   . Sciatica   . Back pain   . Gait disorder   . Cataract   . Edema   . Keratosis   . Sciatica   . Cystic disease of liver   . Paresthesia   . Inguinal hernia   . Hearing loss   . Stroke 2010    no deficits  .  Chronic kidney disease 2013    rhabdo  . H/O hiatal hernia 2008    repaired  . Depression 06/06/2012  . Dementia with behavioral disturbance 06/06/2012   Past Surgical History  Procedure Laterality Date  . Prostate surgery      cancer  . Hernia repair    . Appendectomy    . Esophagogastroduodenoscopy  10/25/2011    Procedure: ESOPHAGOGASTRODUODENOSCOPY (EGD);  Surgeon: Vertell Novak., MD;  Location: [facility] ENDOSCOPY;  Service: Endoscopy;  Laterality: N/A;   Social History:   reports that he has never smoked. He does not have any smokeless tobacco history on file. He reports that  drinks alcohol. He reports that he does not use illicit drugs.  Family History  Problem Relation Age of Onset  . Hypertension Mother     Medications: Reviewed at [facility]   Physical  Exam: Physical Exam  Constitutional: He appears well-developed and well-nourished.  HENT:  Head: Normocephalic and atraumatic.  Eyes: EOM are normal. Pupils are equal, round, and reactive to light.  Neck: Normal range of motion. Neck supple. No JVD present. No thyromegaly present.  Cardiovascular: Normal rate and regular rhythm.   Murmur heard.  Systolic murmur is present with a grade of 2/6  Pulmonary/Chest: Effort normal and breath sounds normal. He has no wheezes. He has no rales.  Abdominal: Soft. Bowel sounds are normal. There is no tenderness.  Musculoskeletal: Normal range of motion. He exhibits no edema.  Scoliokyphosis, walking with walker with SBA  Lymphadenopathy:    He has no cervical adenopathy.  Neurological: He is alert. He displays normal reflexes. No cranial nerve deficit. He exhibits normal muscle tone. Coordination normal.  Skin: Skin is warm and dry. No rash noted. No erythema.  Psychiatric: His mood appears not anxious. His affect is not angry, not blunt and not labile. His speech is not delayed and not slurred. He is not agitated, not aggressive, not slowed, not withdrawn, not actively hallucinating and not combative. Thought content is not paranoid and not delusional. Cognition and memory are impaired. He expresses impulsivity. He does not exhibit a depressed mood. He exhibits abnormal recent memory. He is attentive.    Filed Vitals:   11/03/12 1300  BP: 136/82  Pulse: 80  Temp: 96.7 F (35.9 C)  TempSrc: Tympanic  Resp: 20      Labs reviewed: Basic Metabolic Panel:  Recent Labs  30/86/57 05/11/12 08/03/12 10/05/12  NA  --  133* 131* 135*  K  --  3.8 3.6 3.8  BUN  --  34* 28* 34*  CREATININE  --  1.6* 1.7* 2.0*  TSH 1.28  --   --   --    CBC:  Recent Labs  05/11/12 08/03/12 10/05/12  WBC 6.1 6.1 6.4  HGB 10.6* 11.1* 11.3*  HCT 31* 33* 33*  PLT 365 227 254        Assessment/Plan BPH (benign prostatic hyperplasia) Improved with  Myrbetriq, Tamsulosin, and Finasteride. 2-3 x/night and no trouble if returning to sleep.        Anemia Stable, Hgb 10.6 05/11/12 and 11.1 08/03/12 and 11.4 10/05/12, dc Fe and update CBC ine one month.        Acute renal failure Baseline creatinine 1.5-2.0      Dementia with behavioral disturbance Stabilized in SNF and [facility]        Depression Improved on Effexor 75mg       Hypertension Controlled on Amlodipine 5mg   GERD (gastroesophageal reflux disease) Stable on Protanix 40mg  bid and Zantac 75mg         Hyponatremia Much improved on Demeclocycline, last serum Na 131 08/03/12-135 10/05/12        Unspecified constipation Managed with Senokot S II bid      Family/ Staff Communication:  Observe the patient.   Goals of Care: SNF  Labs/tests ordered: CBC in one month.

## 2012-11-03 NOTE — Assessment & Plan Note (Signed)
Stable on Protanix 40mg bid and Zantac 75mg 

## 2012-11-03 NOTE — Assessment & Plan Note (Addendum)
Stable, Hgb 10.6 05/11/12 and 11.1 08/03/12 and 11.4 10/05/12, dc Fe and update CBC ine one month.

## 2012-11-03 NOTE — Assessment & Plan Note (Signed)
Stabilized in SNF and Namenda                     

## 2012-11-03 NOTE — Assessment & Plan Note (Signed)
Improved on Effexor 75mg   

## 2012-11-04 ENCOUNTER — Emergency Department (HOSPITAL_COMMUNITY)
Admission: EM | Admit: 2012-11-04 | Discharge: 2012-11-04 | Disposition: A | Discharge status: Home / Self Care | Payer: Medicare Other | Attending: Emergency Medicine | Admitting: Emergency Medicine

## 2012-11-04 ENCOUNTER — Encounter (HOSPITAL_COMMUNITY): Payer: Self-pay | Admitting: *Deleted

## 2012-11-04 DIAGNOSIS — R6889 Other general symptoms and signs: Secondary | ICD-10-CM | POA: Diagnosis not present

## 2012-11-04 DIAGNOSIS — G20A1 Parkinson's disease without dyskinesia, without mention of fluctuations: Secondary | ICD-10-CM | POA: Insufficient documentation

## 2012-11-04 DIAGNOSIS — N189 Chronic kidney disease, unspecified: Secondary | ICD-10-CM | POA: Insufficient documentation

## 2012-11-04 DIAGNOSIS — M549 Dorsalgia, unspecified: Secondary | ICD-10-CM | POA: Diagnosis not present

## 2012-11-04 DIAGNOSIS — Z872 Personal history of diseases of the skin and subcutaneous tissue: Secondary | ICD-10-CM | POA: Insufficient documentation

## 2012-11-04 DIAGNOSIS — H9190 Unspecified hearing loss, unspecified ear: Secondary | ICD-10-CM | POA: Diagnosis not present

## 2012-11-04 DIAGNOSIS — F329 Major depressive disorder, single episode, unspecified: Secondary | ICD-10-CM | POA: Diagnosis not present

## 2012-11-04 DIAGNOSIS — Z862 Personal history of diseases of the blood and blood-forming organs and certain disorders involving the immune mechanism: Secondary | ICD-10-CM | POA: Diagnosis not present

## 2012-11-04 DIAGNOSIS — Z8739 Personal history of other diseases of the musculoskeletal system and connective tissue: Secondary | ICD-10-CM | POA: Insufficient documentation

## 2012-11-04 DIAGNOSIS — Z9889 Other specified postprocedural states: Secondary | ICD-10-CM | POA: Diagnosis not present

## 2012-11-04 DIAGNOSIS — I129 Hypertensive chronic kidney disease with stage 1 through stage 4 chronic kidney disease, or unspecified chronic kidney disease: Secondary | ICD-10-CM | POA: Diagnosis not present

## 2012-11-04 DIAGNOSIS — K219 Gastro-esophageal reflux disease without esophagitis: Secondary | ICD-10-CM | POA: Insufficient documentation

## 2012-11-04 DIAGNOSIS — F3289 Other specified depressive episodes: Secondary | ICD-10-CM | POA: Insufficient documentation

## 2012-11-04 DIAGNOSIS — F02818 Dementia in other diseases classified elsewhere, unspecified severity, with other behavioral disturbance: Secondary | ICD-10-CM | POA: Insufficient documentation

## 2012-11-04 DIAGNOSIS — Z79899 Other long term (current) drug therapy: Secondary | ICD-10-CM | POA: Insufficient documentation

## 2012-11-04 DIAGNOSIS — R109 Unspecified abdominal pain: Secondary | ICD-10-CM | POA: Insufficient documentation

## 2012-11-04 DIAGNOSIS — N400 Enlarged prostate without lower urinary tract symptoms: Secondary | ICD-10-CM | POA: Insufficient documentation

## 2012-11-04 DIAGNOSIS — Z8719 Personal history of other diseases of the digestive system: Secondary | ICD-10-CM | POA: Diagnosis not present

## 2012-11-04 DIAGNOSIS — Z8673 Personal history of transient ischemic attack (TIA), and cerebral infarction without residual deficits: Secondary | ICD-10-CM | POA: Diagnosis not present

## 2012-11-04 DIAGNOSIS — R300 Dysuria: Secondary | ICD-10-CM | POA: Insufficient documentation

## 2012-11-04 DIAGNOSIS — F0281 Dementia in other diseases classified elsewhere with behavioral disturbance: Secondary | ICD-10-CM | POA: Diagnosis not present

## 2012-11-04 DIAGNOSIS — G459 Transient cerebral ischemic attack, unspecified: Secondary | ICD-10-CM | POA: Diagnosis not present

## 2012-11-04 DIAGNOSIS — I10 Essential (primary) hypertension: Secondary | ICD-10-CM | POA: Diagnosis not present

## 2012-11-04 DIAGNOSIS — R339 Retention of urine, unspecified: Secondary | ICD-10-CM | POA: Diagnosis not present

## 2012-11-04 DIAGNOSIS — Z8639 Personal history of other endocrine, nutritional and metabolic disease: Secondary | ICD-10-CM | POA: Insufficient documentation

## 2012-11-04 DIAGNOSIS — F411 Generalized anxiety disorder: Secondary | ICD-10-CM | POA: Diagnosis not present

## 2012-11-04 DIAGNOSIS — Z8679 Personal history of other diseases of the circulatory system: Secondary | ICD-10-CM | POA: Diagnosis not present

## 2012-11-04 DIAGNOSIS — G20 Parkinson's disease: Secondary | ICD-10-CM | POA: Insufficient documentation

## 2012-11-04 LAB — URINALYSIS, ROUTINE W REFLEX MICROSCOPIC
Bilirubin Urine: NEGATIVE
Ketones, ur: NEGATIVE mg/dL
Leukocytes, UA: NEGATIVE
Nitrite: NEGATIVE
Protein, ur: NEGATIVE mg/dL
Urobilinogen, UA: 0.2 mg/dL (ref 0.0–1.0)

## 2012-11-04 MED ORDER — OXYCODONE-ACETAMINOPHEN 5-325 MG PO TABS
1.0000 | ORAL_TABLET | Freq: Once | ORAL | Status: AC
  Administered 2012-11-04: 1 via ORAL
  Filled 2012-11-04: qty 1

## 2012-11-04 NOTE — ED Notes (Signed)
Bedside report received from previous RN, Tim.

## 2012-11-04 NOTE — ED Notes (Signed)
EMS reports pt states he has flank pain and difficulty urinating, has to go frequent with very little results

## 2012-11-04 NOTE — ED Notes (Signed)
[facility] called for transport.  

## 2012-11-04 NOTE — ED Provider Notes (Signed)
CSN: [id number]     Arrival date & time 11/04/12  1822 History   First MD Initiated Contact with Patient 11/04/12 1823     Chief Complaint  Patient presents with  . Urinary Frequency   (Consider location/radiation/quality/duration/timing/severity/associated sxs/prior Treatment) The history is provided by the patient, the EMS personnel, the nursing home and a relative.   patient presents with 24 hours of intermittent urinary frequency.  He states he feels like he has a full bladder but when he urinates only small amount comes out.  He is also having some bilateral flank pain.  He denies nausea and vomiting.  Fevers or chills.  He does report some discomfort with urination.  He does have a history of benign prostatic hypertrophy.  He's had urinary retention before in the past.  He required urinary catheterization before in the past. Denies abdominal pain.  Past Medical History  Diagnosis Date  . Thyroid disease   . Anemia   . Vitamin D deficiency disease   . Hyponatremia   . Anxiety   . Parkinsonism   . Hearing loss   . Hypertension   . PVC (premature ventricular contraction)   . CVA (cerebral infarction)   . GERD (gastroesophageal reflux disease)   . Diverticulosis   . Sciatica   . Back pain   . Gait disorder   . Cataract   . Edema   . Keratosis   . Sciatica   . Cystic disease of liver   . Paresthesia   . Inguinal hernia   . Hearing loss   . Stroke 2010    no deficits  . Chronic kidney disease 2013    rhabdo  . H/O hiatal hernia 2008    repaired  . Depression 06/06/2012  . Dementia with behavioral disturbance 06/06/2012   Past Surgical History  Procedure Laterality Date  . Prostate surgery      cancer  . Hernia repair    . Appendectomy    . Esophagogastroduodenoscopy  10/25/2011    Procedure: ESOPHAGOGASTRODUODENOSCOPY (EGD);  Surgeon: Vertell Novak., MD;  Location: [facility] ENDOSCOPY;  Service: Endoscopy;  Laterality: N/A;   Family History  Problem Relation Age of  Onset  . Hypertension Mother    History  Substance Use Topics  . Smoking status: Never Smoker   . Smokeless tobacco: Not on file  . Alcohol Use: Yes     Comment: occasionaly    Review of Systems  Genitourinary: Positive for frequency.  All other systems reviewed and are negative.    Allergies  Olanzapine and Rivastigmine  Home Medications   Current Outpatient Rx  Name  Route  Sig  Dispense  Refill  . acetaminophen (TYLENOL) 500 MG tablet   Oral   Take 500 mg by mouth at bedtime. To be given with Restoril         . amLODipine (NORVASC) 5 MG tablet   Oral   Take 1 tablet (5 mg total) by mouth every morning.   15 tablet   0   . carboxymethylcellulose (REFRESH TEARS) 0.5 % SOLN   Both Eyes   Place 1 drop into both eyes 3 (three) times daily.         . cholecalciferol (VITAMIN D) 1000 UNITS tablet   Oral   Take 1,000 Units by mouth every morning.          . cycloSPORINE (RESTASIS) 0.05 % ophthalmic emulsion   Both Eyes   Place 1 drop into both eyes 2 (  two) times daily.          Marland Kitchen demeclocycline (DECLOMYCIN) 150 MG tablet   Oral   Take 150 mg by mouth 2 (two) times daily.         . feeding supplement (ENSURE CLINICAL STRENGTH) LIQD   Oral   Take 237 mLs by mouth 2 (two) times daily with a meal.   237 mL   10   . finasteride (PROSCAR) 5 MG tablet   Oral   Take 5 mg by mouth daily.         . Hypromellose (GENTEAL OP)   Ophthalmic   Apply 1 drop to eye at bedtime.         . magnesium hydroxide (MILK OF MAGNESIA) 400 MG/5ML suspension   Oral   Take 30 mLs by mouth daily as needed. Please take 30 ml by mouth for the next three days (util 8/31) then you can take as needed for constipation.   360 mL   0   . Melatonin 3 MG TABS   Oral   Take 3 mg by mouth at bedtime.         . Memantine HCl ER (NAMENDA XR) 28 MG CP24   Oral   Take 28 mg by mouth daily.         . mirabegron ER (MYRBETRIQ) 25 MG TB24 tablet   Oral   Take 25 mg by mouth  2 (two) times daily.         Marland Kitchen oxycodone (OXY-IR) 5 MG capsule   Oral   Take 5 mg by mouth every 4 (four) hours as needed. pain         . pantoprazole (PROTONIX) 40 MG tablet   Oral   Take 40 mg by mouth daily.         . potassium chloride SA (K-DUR,KLOR-CON) 20 MEQ tablet   Oral   Take 20 mEq by mouth daily.         . ranitidine (ZANTAC) 150 MG tablet   Oral   Take 150 mg by mouth at bedtime.         . senna (SENOKOT) 8.6 MG tablet   Oral   Take 1 tablet by mouth 2 (two) times daily.          . Tamsulosin HCl (FLOMAX) 0.4 MG CAPS   Oral   Take 0.4 mg by mouth every morning.          . venlafaxine (EFFEXOR) 75 MG tablet   Oral   Take 75 mg by mouth daily.          There were no vitals taken for this visit. Physical Exam  Nursing note and vitals reviewed. Constitutional: He is oriented to person, place, and time. He appears well-developed and well-nourished.  HENT:  Head: Normocephalic and atraumatic.  Eyes: EOM are normal.  Neck: Normal range of motion.  Cardiovascular: Normal rate, regular rhythm, normal heart sounds and intact distal pulses.   Pulmonary/Chest: Effort normal and breath sounds normal. No respiratory distress.  Abdominal: Soft. He exhibits no distension. There is no tenderness.  Genitourinary: Rectum normal.  Musculoskeletal: Normal range of motion.  Neurological: He is alert and oriented to person, place, and time.  Skin: Skin is warm and dry.  Psychiatric: He has a normal mood and affect. Judgment normal.    ED Course  Procedures (including critical care time) Labs Review Labs Reviewed  URINE CULTURE  URINALYSIS, ROUTINE W REFLEX MICROSCOPIC  Imaging Review No results found.  MDM   1. Urinary retention    Patient presents with urinary retention.  After placement of the Foley catheter approximately 400 cc of urine went quickly into the Foley catheter.  He was having what appeared to be overflow incontinence.  Urinalysis  itself shows no signs of infection.  Patient our he has a scheduled appointment with urology in 5 days regarding his intermittent urinary flow issues.  He's currently on Flomax in an effort to help with his prostatic hypertrophy.  He feels better after catheter placement.  Discharge home in good condition.  He understands return to the ER for new or worsening symptoms.    Lyanne Co, MD 11/04/12 [phone]

## 2012-11-04 NOTE — ED Notes (Signed)
Bed: HQ46 Expected date: 11/04/12 Expected time: 6:26 PM Means of arrival: Ambulance Comments: Urinary urgency

## 2012-11-06 LAB — URINE CULTURE: Culture: NO GROWTH

## 2012-11-07 ENCOUNTER — Encounter: Payer: Self-pay | Admitting: Nurse Practitioner

## 2012-11-07 ENCOUNTER — Non-Acute Institutional Stay (SKILLED_NURSING_FACILITY): Payer: Medicare Other | Admitting: Nurse Practitioner

## 2012-11-07 DIAGNOSIS — F0391 Unspecified dementia with behavioral disturbance: Secondary | ICD-10-CM | POA: Diagnosis not present

## 2012-11-07 DIAGNOSIS — F329 Major depressive disorder, single episode, unspecified: Secondary | ICD-10-CM | POA: Diagnosis not present

## 2012-11-07 DIAGNOSIS — N400 Enlarged prostate without lower urinary tract symptoms: Secondary | ICD-10-CM | POA: Diagnosis not present

## 2012-11-07 DIAGNOSIS — N189 Chronic kidney disease, unspecified: Secondary | ICD-10-CM | POA: Diagnosis not present

## 2012-11-07 DIAGNOSIS — D649 Anemia, unspecified: Secondary | ICD-10-CM

## 2012-11-07 DIAGNOSIS — E876 Hypokalemia: Secondary | ICD-10-CM

## 2012-11-07 DIAGNOSIS — K5900 Constipation, unspecified: Secondary | ICD-10-CM

## 2012-11-07 NOTE — Assessment & Plan Note (Signed)
Stable, Hgb 10.6 05/11/12 and 11.1 08/03/12 and 11.4 10/05/12, dc'd Fe and update CBC ine one month scheduled.

## 2012-11-07 NOTE — Assessment & Plan Note (Signed)
Supplemented with Kcl 47meq--serum K 3.8 10/05/12

## 2012-11-07 NOTE — Assessment & Plan Note (Signed)
Recurrent urinary retention-Foley inserted 11/04/12 at ED, takes Myrbetriq, Tamsulosin, and Finasteride.

## 2012-11-07 NOTE — Assessment & Plan Note (Signed)
Baseline creatinine 1.5-2.0 

## 2012-11-07 NOTE — Assessment & Plan Note (Signed)
Improved on Effexor 75mg   

## 2012-11-07 NOTE — Assessment & Plan Note (Signed)
Stabilized in SNF and Namenda                     

## 2012-11-07 NOTE — Assessment & Plan Note (Signed)
Managed with Senokot S II bid.    

## 2012-11-07 NOTE — Progress Notes (Signed)
Patient ID: Vincent Potts, male   DOB: [birth year], 79 y.o.   MRN: [id number] Code Status: DNR  Allergies  Allergen Reactions  . Olanzapine Other (See Comments)    unknown  . Rivastigmine Nausea Only    Chief Complaint  Patient presents with  . Medical Managment of Chronic Issues    s/p ED evaluation 11/04/12 for urinaty retention.     HPI: Patient is a 79 y.o. male seen in the SNF at [facility] today for evaluation of f/u ED evaluation 11/04/12. The patient presented to ED with 24 hours of intermittent urinary frequency. He stated he felt  like he had a full bladder but when he urinated  only small amount comes out. He is also had some bilateral flank pain. He denied nausea and vomiting. Fevers or chills. He reported some discomfort with urination. He had a history of benign prostatic hypertrophy. He's had urinary retention before in the past. He required urinary catheterization before in the past. After placement of the Foley catheter approximately 400 cc of urine went quickly into the Foley catheter. He was having what appeared to be overflow incontinence. Urinalysis itself shows no signs of infection. Patient our he has a scheduled appointment with urology 11/08/12. Urine appears pale yellow and clear today. He denied abd pain or discomfort as well.   Problem List Items Addressed This Visit   Anemia     Stable, Hgb 10.6 05/11/12 and 11.1 08/03/12 and 11.4 10/05/12, dc'd Fe and update CBC ine one month scheduled.             BPH (benign prostatic hyperplasia) - Primary     Recurrent urinary retention-Foley inserted 11/04/12 at ED, takes Myrbetriq, Tamsulosin, and Finasteride.             Chronic kidney disease     Baseline creatinine 1.5-2.0        Dementia with behavioral disturbance     Stabilized in SNF and [facility]            Depression     Improved on Effexor 75mg           Hypokalemia     Supplemented with Kcl 54meq--serum K 3.8 10/05/12      Unspecified constipation     Managed with Senokot S II bid           Review of Systems:  Review of Systems  Constitutional: Negative for fever, chills, weight loss, malaise/fatigue and diaphoresis.  HENT: Positive for hearing loss. Negative for ear pain, congestion, sore throat and neck pain.   Eyes: Negative for blurred vision, double vision, photophobia, pain, discharge and redness.  Respiratory: Negative for cough, sputum production, shortness of breath and wheezing.   Cardiovascular: Negative for chest pain, palpitations, orthopnea, claudication, leg swelling and PND.  Gastrointestinal: Negative for heartburn, nausea, vomiting, abdominal pain, diarrhea and constipation.  Genitourinary: Negative for dysuria, urgency and flank pain. Frequency: much better.  Musculoskeletal: Positive for back pain and joint pain. Negative for falls.  Skin: Negative for itching and rash.  Neurological: Negative for dizziness, tremors, speech change, focal weakness, seizures, loss of consciousness and headaches.  Endo/Heme/Allergies: Negative for environmental allergies and polydipsia. Does not bruise/bleed easily.  Psychiatric/Behavioral: Positive for memory loss. Negative for depression and hallucinations. The patient is not nervous/anxious and does not have insomnia.      Past Medical History  Diagnosis Date  . Thyroid disease   . Anemia   . Vitamin D deficiency disease   .  Hyponatremia   . Anxiety   . Parkinsonism   . Hearing loss   . Hypertension   . PVC (premature ventricular contraction)   . CVA (cerebral infarction)   . GERD (gastroesophageal reflux disease)   . Diverticulosis   . Sciatica   . Back pain   . Gait disorder   . Cataract   . Edema   . Keratosis   . Sciatica   . Cystic disease of liver   . Paresthesia   . Inguinal hernia   . Hearing loss   . Stroke 2010    no deficits  . Chronic kidney disease 2013    rhabdo  . H/O hiatal hernia 2008    repaired  .  Depression 06/06/2012  . Dementia with behavioral disturbance 06/06/2012   Past Surgical History  Procedure Laterality Date  . Prostate surgery      cancer  . Hernia repair    . Appendectomy    . Esophagogastroduodenoscopy  10/25/2011    Procedure: ESOPHAGOGASTRODUODENOSCOPY (EGD);  Surgeon: Vertell Novak., MD;  Location: [facility] ENDOSCOPY;  Service: Endoscopy;  Laterality: N/A;   Social History:   reports that he has never smoked. He does not have any smokeless tobacco history on file. He reports that  drinks alcohol. He reports that he does not use illicit drugs.  Family History  Problem Relation Age of Onset  . Hypertension Mother     Medications: Reviewed at [facility]   Physical Exam: Physical Exam  Constitutional: He appears well-developed and well-nourished.  HENT:  Head: Normocephalic and atraumatic.  Eyes: EOM are normal. Pupils are equal, round, and reactive to light.  Neck: Normal range of motion. Neck supple. No JVD present. No thyromegaly present.  Cardiovascular: Normal rate and regular rhythm.   Murmur heard.  Systolic murmur is present with a grade of 2/6  Pulmonary/Chest: Effort normal and breath sounds normal. He has no wheezes. He has no rales.  Abdominal: Soft. Bowel sounds are normal. There is no tenderness.  Genitourinary:  Foley Cath since 11/04/12 due to urinary retention.   Musculoskeletal: Normal range of motion. He exhibits no edema.  Scoliokyphosis, walking with walker with SBA  Lymphadenopathy:    He has no cervical adenopathy.  Neurological: He is alert. He displays normal reflexes. No cranial nerve deficit. He exhibits normal muscle tone. Coordination normal.  Skin: Skin is warm and dry. No rash noted. No erythema.  Psychiatric: His mood appears not anxious. His affect is not angry, not blunt and not labile. His speech is not delayed and not slurred. He is not agitated, not aggressive, not slowed, not withdrawn, not actively hallucinating and not  combative. Thought content is not paranoid and not delusional. Cognition and memory are impaired. He expresses impulsivity. He does not exhibit a depressed mood. He exhibits abnormal recent memory. He is attentive.    Filed Vitals:   11/07/12 1405  BP: 115/79  Pulse: 18  Temp: 98.2 F (36.8 C)  TempSrc: Tympanic  Resp: 18      Labs reviewed: Basic Metabolic Panel:  Recent Labs  16/10/96 05/11/12 08/03/12 10/05/12  NA  --  133* 131* 135*  K  --  3.8 3.6 3.8  BUN  --  34* 28* 34*  CREATININE  --  1.6* 1.7* 2.0*  TSH 1.28  --   --   --    CBC:  Recent Labs  05/11/12 08/03/12 10/05/12  WBC 6.1 6.1 6.4  HGB 10.6* 11.1* 11.3*  HCT 31* 33* 33*  PLT 365 227 254   Assessment/Plan  Problem List Items Addressed This Visit   Anemia     Stable, Hgb 10.6 05/11/12 and 11.1 08/03/12 and 11.4 10/05/12, dc'd Fe and update CBC ine one month scheduled.             BPH (benign prostatic hyperplasia) - Primary     Recurrent urinary retention-Foley inserted 11/04/12 at ED, takes Myrbetriq, Tamsulosin, and Finasteride.             Chronic kidney disease     Baseline creatinine 1.5-2.0        Dementia with behavioral disturbance     Stabilized in SNF and [facility]            Depression     Improved on Effexor 75mg           Hypokalemia     Supplemented with Kcl 32meq--serum K 3.8 10/05/12      Unspecified constipation     Managed with Senokot S II bid           Family/ Staff Communication:  Observe the patient.   Goals of Care: SNF  Labs/tests ordered: CBC in one month pending.

## 2012-11-08 DIAGNOSIS — N401 Enlarged prostate with lower urinary tract symptoms: Secondary | ICD-10-CM | POA: Diagnosis not present

## 2012-11-13 DIAGNOSIS — N401 Enlarged prostate with lower urinary tract symptoms: Secondary | ICD-10-CM | POA: Diagnosis not present

## 2012-11-13 DIAGNOSIS — R350 Frequency of micturition: Secondary | ICD-10-CM | POA: Diagnosis not present

## 2012-11-13 DIAGNOSIS — C61 Malignant neoplasm of prostate: Secondary | ICD-10-CM | POA: Diagnosis not present

## 2012-11-13 DIAGNOSIS — R351 Nocturia: Secondary | ICD-10-CM | POA: Diagnosis not present

## 2012-11-17 ENCOUNTER — Non-Acute Institutional Stay (SKILLED_NURSING_FACILITY): Payer: Medicare Other | Admitting: Nurse Practitioner

## 2012-11-17 ENCOUNTER — Encounter: Payer: Self-pay | Admitting: Nurse Practitioner

## 2012-11-17 DIAGNOSIS — F329 Major depressive disorder, single episode, unspecified: Secondary | ICD-10-CM

## 2012-11-17 DIAGNOSIS — K219 Gastro-esophageal reflux disease without esophagitis: Secondary | ICD-10-CM

## 2012-11-17 DIAGNOSIS — F0391 Unspecified dementia with behavioral disturbance: Secondary | ICD-10-CM

## 2012-11-17 DIAGNOSIS — D649 Anemia, unspecified: Secondary | ICD-10-CM

## 2012-11-17 DIAGNOSIS — J069 Acute upper respiratory infection, unspecified: Secondary | ICD-10-CM | POA: Diagnosis not present

## 2012-11-17 DIAGNOSIS — I10 Essential (primary) hypertension: Secondary | ICD-10-CM

## 2012-11-17 DIAGNOSIS — N400 Enlarged prostate without lower urinary tract symptoms: Secondary | ICD-10-CM

## 2012-11-17 DIAGNOSIS — E871 Hypo-osmolality and hyponatremia: Secondary | ICD-10-CM

## 2012-11-17 NOTE — Assessment & Plan Note (Signed)
Stable, Hgb 10.6 05/11/12 and 11.1 08/03/12 and 11.4 10/05/12, dc'd Fe and update CBC ine one month scheduled.          

## 2012-11-17 NOTE — Assessment & Plan Note (Signed)
Much improved on Demeclocycline, last serum Na 131 08/03/12 and 135 10/05/12

## 2012-11-17 NOTE — Assessment & Plan Note (Signed)
Stable on Protanix 40mg bid and Zantac 75mg 

## 2012-11-17 NOTE — Assessment & Plan Note (Signed)
C/o nasal stuffiness, scratchy throat, "sinus infection", afebrile and no cough. Z-pk, Claritin and Fluticasone nasal spray x2weks

## 2012-11-17 NOTE — Assessment & Plan Note (Signed)
Improved on Effexor 75mg   

## 2012-11-17 NOTE — Assessment & Plan Note (Signed)
Stabilized in SNF and Namenda                     

## 2012-11-17 NOTE — Assessment & Plan Note (Signed)
Controlled on Amlodipine 5mg   

## 2012-11-17 NOTE — Assessment & Plan Note (Signed)
Recurrent urinary retention-Foley inserted 11/04/12 at ED-removed 11/13/12, saw Urology 11/13/12-Ok voiding and f/u in 3 mos, continued Tamsulosin, and Finasteride, off Myrbetriq.

## 2012-11-17 NOTE — Progress Notes (Signed)
Patient ID: Vincent Potts, male   DOB: 1924-12-21, 79 y.o.   MRN: [id number] Code Status: DNR  Allergies  Allergen Reactions  . Olanzapine Other (See Comments)    unknown  . Rivastigmine Nausea Only    Chief Complaint  Patient presents with  . Medical Managment of Chronic Issues    sinus infection.     HPI: Patient is a 79 y.o. male seen in the SNF at [facility] today for evaluation of "sinus infection" and other chronic medical conditions.  Problem List Items Addressed This Visit   Anemia     Stable, Hgb 10.6 05/11/12 and 11.1 08/03/12 and 11.4 10/05/12, dc'd Fe and update CBC ine one month scheduled.               BPH (benign prostatic hyperplasia)     Recurrent urinary retention-Foley inserted 11/04/12 at ED-removed 11/13/12, saw Urology 11/13/12-Ok voiding and f/u in 3 mos, continued Tamsulosin, and Finasteride, off Myrbetriq.               Dementia with behavioral disturbance     Stabilized in SNF and [facility]              Depression     Improved on Effexor 75mg             GERD (gastroesophageal reflux disease)     Stable on Protanix 40mg  bid and Zantac 75mg             Hypertension     Controlled on Amlodipine 5mg              Hyponatremia     Much improved on Demeclocycline, last serum Na 131 08/03/12 and 135 10/05/12          URI (upper respiratory infection) - Primary     C/o nasal stuffiness, scratchy throat, "sinus infection", afebrile and no cough. Z-pk, Claritin and Fluticasone nasal spray x2weks       Review of Systems:  Review of Systems  Constitutional: Negative for fever, chills, weight loss, malaise/fatigue and diaphoresis.  HENT: Positive for hearing loss, congestion and sore throat. Negative for ear pain and neck pain.   Eyes: Negative for blurred vision, double vision, photophobia, pain, discharge and redness.  Respiratory: Negative for cough, sputum production, shortness of breath and wheezing.    Cardiovascular: Negative for chest pain, palpitations, orthopnea, claudication, leg swelling and PND.  Gastrointestinal: Negative for heartburn, nausea, vomiting, abdominal pain, diarrhea and constipation.  Genitourinary: Negative for dysuria, urgency and flank pain. Frequency: much better.  Musculoskeletal: Positive for back pain and joint pain. Negative for falls.  Skin: Negative for itching and rash.  Neurological: Negative for dizziness, tremors, speech change, focal weakness, seizures, loss of consciousness and headaches.  Endo/Heme/Allergies: Negative for environmental allergies and polydipsia. Does not bruise/bleed easily.  Psychiatric/Behavioral: Positive for memory loss. Negative for depression and hallucinations. The patient is not nervous/anxious and does not have insomnia.      Past Medical History  Diagnosis Date  . Thyroid disease   . Anemia   . Vitamin D deficiency disease   . Hyponatremia   . Anxiety   . Parkinsonism   . Hearing loss   . Hypertension   . PVC (premature ventricular contraction)   . CVA (cerebral infarction)   . GERD (gastroesophageal reflux disease)   . Diverticulosis   . Sciatica   . Back pain   . Gait disorder   . Cataract   . Edema   .  Keratosis   . Sciatica   . Cystic disease of liver   . Paresthesia   . Inguinal hernia   . Hearing loss   . Stroke 2010    no deficits  . Chronic kidney disease 2013    rhabdo  . H/O hiatal hernia 2008    repaired  . Depression 06/06/2012  . Dementia with behavioral disturbance 06/06/2012   Past Surgical History  Procedure Laterality Date  . Prostate surgery      cancer  . Hernia repair    . Appendectomy    . Esophagogastroduodenoscopy  10/25/2011    Procedure: ESOPHAGOGASTRODUODENOSCOPY (EGD);  Surgeon: Vertell Novak., MD;  Location: [facility] ENDOSCOPY;  Service: Endoscopy;  Laterality: N/A;   Social History:   reports that he has never smoked. He does not have any smokeless tobacco history on file.  He reports that  drinks alcohol. He reports that he does not use illicit drugs.  Family History  Problem Relation Age of Onset  . Hypertension Mother     Medications: Reviewed at [facility]   Physical Exam: Physical Exam  Constitutional: He appears well-developed and well-nourished.  HENT:  Head: Normocephalic and atraumatic. Not macrocephalic.  Nose: Mucosal edema and rhinorrhea present. Right sinus exhibits no maxillary sinus tenderness and no frontal sinus tenderness. Left sinus exhibits no maxillary sinus tenderness and no frontal sinus tenderness.  Mouth/Throat: Uvula is midline. Posterior oropharyngeal erythema present. No oropharyngeal exudate or posterior oropharyngeal edema.  Eyes: EOM are normal. Pupils are equal, round, and reactive to light.  Neck: Normal range of motion. Neck supple. No JVD present. No thyromegaly present.  Cardiovascular: Normal rate and regular rhythm.   Murmur heard.  Systolic murmur is present with a grade of 2/6  Pulmonary/Chest: Effort normal and breath sounds normal. He has no wheezes. He has no rales.  Abdominal: Soft. Bowel sounds are normal. There is no tenderness.  Genitourinary:  Foley Cath since 11/04/12 due to urinary retention.   Musculoskeletal: Normal range of motion. He exhibits no edema.  Scoliokyphosis, walking with walker with SBA  Lymphadenopathy:    He has no cervical adenopathy.  Neurological: He is alert. He displays normal reflexes. No cranial nerve deficit. He exhibits normal muscle tone. Coordination normal.  Skin: Skin is warm and dry. No rash noted. No erythema.  Psychiatric: His mood appears not anxious. His affect is not angry, not blunt and not labile. His speech is not delayed and not slurred. He is not agitated, not aggressive, not slowed, not withdrawn, not actively hallucinating and not combative. Thought content is not paranoid and not delusional. Cognition and memory are impaired. He expresses impulsivity. He does not  exhibit a depressed mood. He exhibits abnormal recent memory. He is attentive.    Filed Vitals:   11/17/12 1234  BP: 126/89  Pulse: 97  Temp: 97.9 F (36.6 C)  TempSrc: Tympanic  Resp: 16      Labs reviewed: Basic Metabolic Panel:  Recent Labs  40/98/11 05/11/12 08/03/12 10/05/12  NA  --  133* 131* 135*  K  --  3.8 3.6 3.8  BUN  --  34* 28* 34*  CREATININE  --  1.6* 1.7* 2.0*  TSH 1.28  --   --   --    CBC:  Recent Labs  05/11/12 08/03/12 10/05/12  WBC 6.1 6.1 6.4  HGB 10.6* 11.1* 11.3*  HCT 31* 33* 33*  PLT 365 227 254   Assessment/Plan  Problem List Items Addressed This  Visit   Anemia     Stable, Hgb 10.6 05/11/12 and 11.1 08/03/12 and 11.4 10/05/12, dc'd Fe and update CBC ine one month scheduled.             BPH (benign prostatic hyperplasia) - Primary     Recurrent urinary retention-Foley inserted 11/04/12 at ED, takes Myrbetriq, Tamsulosin, and Finasteride.             Chronic kidney disease     Baseline creatinine 1.5-2.0        Dementia with behavioral disturbance     Stabilized in SNF and [facility]            Depression     Improved on Effexor 75mg           Hypokalemia     Supplemented with Kcl 40meq--serum K 3.8 10/05/12      Unspecified constipation     Managed with Senokot S II bid           Family/ Staff Communication:  Observe the patient.   Goals of Care: SNF  Labs/tests ordered: CBC in one month scheduled.

## 2012-11-28 DIAGNOSIS — R3915 Urgency of urination: Secondary | ICD-10-CM | POA: Diagnosis not present

## 2012-11-28 DIAGNOSIS — R350 Frequency of micturition: Secondary | ICD-10-CM | POA: Diagnosis not present

## 2012-11-28 DIAGNOSIS — R351 Nocturia: Secondary | ICD-10-CM | POA: Diagnosis not present

## 2012-11-30 DIAGNOSIS — I10 Essential (primary) hypertension: Secondary | ICD-10-CM | POA: Diagnosis not present

## 2012-11-30 DIAGNOSIS — E860 Dehydration: Secondary | ICD-10-CM | POA: Diagnosis not present

## 2012-11-30 LAB — CBC AND DIFFERENTIAL
HCT: 31 % — AB (ref 41–53)
Hemoglobin: 10.5 g/dL — AB (ref 13.5–17.5)
Platelets: 245 10*3/uL (ref 150–399)
WBC: 7.6 10^3/mL

## 2012-12-15 ENCOUNTER — Encounter: Payer: Self-pay | Admitting: Nurse Practitioner

## 2012-12-15 ENCOUNTER — Non-Acute Institutional Stay (SKILLED_NURSING_FACILITY): Payer: Medicare Other | Admitting: Nurse Practitioner

## 2012-12-15 DIAGNOSIS — N400 Enlarged prostate without lower urinary tract symptoms: Secondary | ICD-10-CM

## 2012-12-15 DIAGNOSIS — E876 Hypokalemia: Secondary | ICD-10-CM

## 2012-12-15 DIAGNOSIS — E871 Hypo-osmolality and hyponatremia: Secondary | ICD-10-CM

## 2012-12-15 DIAGNOSIS — D649 Anemia, unspecified: Secondary | ICD-10-CM

## 2012-12-15 DIAGNOSIS — F0391 Unspecified dementia with behavioral disturbance: Secondary | ICD-10-CM

## 2012-12-15 DIAGNOSIS — F3289 Other specified depressive episodes: Secondary | ICD-10-CM

## 2012-12-15 DIAGNOSIS — K219 Gastro-esophageal reflux disease without esophagitis: Secondary | ICD-10-CM

## 2012-12-15 DIAGNOSIS — F32A Depression, unspecified: Secondary | ICD-10-CM

## 2012-12-15 DIAGNOSIS — F03918 Unspecified dementia, unspecified severity, with other behavioral disturbance: Secondary | ICD-10-CM

## 2012-12-15 DIAGNOSIS — I10 Essential (primary) hypertension: Secondary | ICD-10-CM

## 2012-12-15 DIAGNOSIS — F329 Major depressive disorder, single episode, unspecified: Secondary | ICD-10-CM

## 2012-12-15 DIAGNOSIS — K5900 Constipation, unspecified: Secondary | ICD-10-CM

## 2012-12-15 NOTE — Assessment & Plan Note (Signed)
Stabilized in SNF and Namenda                     

## 2012-12-15 NOTE — Assessment & Plan Note (Signed)
Stable on Protanix 40mg bid and Zantac 75mg 

## 2012-12-15 NOTE — Assessment & Plan Note (Signed)
Controlled on Amlodipine 5mg   

## 2012-12-15 NOTE — Assessment & Plan Note (Signed)
Improved on Effexor 75mg   

## 2012-12-15 NOTE — Assessment & Plan Note (Signed)
Stable, Hgb 10-11, off Fe

## 2012-12-15 NOTE — Progress Notes (Signed)
Patient ID: Vincent Potts, male   DOB: [birth year], 79 y.o.   MRN: [id number]  Code Status: DNR  Allergies  Allergen Reactions  . Olanzapine Other (See Comments)    unknown  . Rivastigmine Nausea Only    Chief Complaint  Patient presents with  . Medical Managment of Chronic Issues    HPI: Patient is a 79 y.o. male seen in the SNF at [facility] today for evaluation of his chronic medical conditions.  Problem List Items Addressed This Visit   Anemia - Primary     Stable, Hgb 10-11, off Fe                 BPH (benign prostatic hyperplasia)     Recurrent urinary retention-Foley inserted 11/04/12 at ED-removed 11/13/12, saw Urology 11/13/12-Ok voiding and f/u in 3 mos, continued Tamsulosin, and Finasteride, off Myrbetriq.                 Dementia with behavioral disturbance     Stabilized in SNF and [facility]                Depression     Improved on Effexor 75mg               GERD (gastroesophageal reflux disease)     Stable on Protanix 40mg  bid and Zantac 75mg               Hypertension     Controlled on Amlodipine 5mg                Hypokalemia     Supplemented with Kcl 41meq--serum K 3.8 10/05/12        Hyponatremia     Much improved on Demeclocycline, last serum Na 131 08/03/12 and 135 10/05/12            Unspecified constipation     Managed with Senokot S II bid             Review of Systems:  Review of Systems  Constitutional: Negative for fever, chills, weight loss, malaise/fatigue and diaphoresis.  HENT: Positive for hearing loss.  Negative for ear pain and neck pain.   Eyes: Negative for blurred vision, double vision, photophobia, pain, discharge and redness.  Respiratory: Negative for cough, sputum production, shortness of breath and wheezing.   Cardiovascular: Negative for chest pain, palpitations, orthopnea, claudication, leg swelling and PND.  Gastrointestinal: Negative for  heartburn, nausea, vomiting, abdominal pain, diarrhea and constipation.  Genitourinary: Negative for dysuria, urgency and flank pain. Frequency: much better.  Musculoskeletal: Positive for back pain and joint pain. Negative for falls.  Skin: Negative for itching and rash.  Neurological: Negative for dizziness, tremors, speech change, focal weakness, seizures, loss of consciousness and headaches.  Endo/Heme/Allergies: Negative for environmental allergies and polydipsia. Does not bruise/bleed easily.  Psychiatric/Behavioral: Positive for memory loss. Negative for depression and hallucinations. The patient is not nervous/anxious and does not have insomnia.      Past Medical History  Diagnosis Date  . Thyroid disease   . Anemia   . Vitamin D deficiency disease   . Hyponatremia   . Anxiety   . Parkinsonism   . Hearing loss   . Hypertension   . PVC (premature ventricular contraction)   . CVA (cerebral infarction)   . GERD (gastroesophageal reflux disease)   . Diverticulosis   . Sciatica   . Back pain   . Gait disorder   . Cataract   . Edema   .  Keratosis   . Sciatica   . Cystic disease of liver   . Paresthesia   . Inguinal hernia   . Hearing loss   . Stroke 2010    no deficits  . Chronic kidney disease 2013    rhabdo  . H/O hiatal hernia 2008    repaired  . Depression 06/06/2012  . Dementia with behavioral disturbance 06/06/2012   Past Surgical History  Procedure Laterality Date  . Prostate surgery      cancer  . Hernia repair    . Appendectomy    . Esophagogastroduodenoscopy  10/25/2011    Procedure: ESOPHAGOGASTRODUODENOSCOPY (EGD);  Surgeon: Vertell Novak., MD;  Location: [facility] ENDOSCOPY;  Service: Endoscopy;  Laterality: N/A;   Social History:   reports that he has never smoked. He does not have any smokeless tobacco history on file. He reports that he drinks alcohol. He reports that he does not use illicit drugs.  Family History  Problem Relation Age of Onset  .  Hypertension Mother     Medications: Reviewed at [facility]   Physical Exam: Physical Exam  Constitutional: He appears well-developed and well-nourished.  HENT:  Head: Normocephalic and atraumatic. Not macrocephalic.  Nose: rhinorrhea present. Right sinus exhibits no maxillary sinus tenderness and no frontal sinus tenderness. Left sinus exhibits no maxillary sinus tenderness and no frontal sinus tenderness.  Mouth/Throat: Uvula is midline. No oropharyngeal exudate or posterior oropharyngeal edema.  Eyes: EOM are normal. Pupils are equal, round, and reactive to light.  Neck: Normal range of motion. Neck supple. No JVD present. No thyromegaly present.  Cardiovascular: Normal rate and regular rhythm.   Murmur heard.  Systolic murmur is present with a grade of 2/6  Pulmonary/Chest: Effort normal and breath sounds normal. He has no wheezes. He has no rales.  Abdominal: Soft. Bowel sounds are normal. There is no tenderness.  Genitourinary:  Foley Cath since 11/04/12 due to urinary retention-removes.   Musculoskeletal: Normal range of motion. He exhibits no edema.  Scoliokyphosis, walking with walker with SBA  Lymphadenopathy:    He has no cervical adenopathy.  Neurological: He is alert. He displays normal reflexes. No cranial nerve deficit. He exhibits normal muscle tone. Coordination normal.  Skin: Skin is warm and dry. No rash noted. No erythema.  Psychiatric: His mood appears not anxious. His affect is not angry, not blunt and not labile. His speech is not delayed and not slurred. He is not agitated, not aggressive, not slowed, not withdrawn, not actively hallucinating and not combative. Thought content is not paranoid and not delusional. Cognition and memory are impaired. He does not exhibit a depressed mood. He exhibits abnormal recent memory. He is attentive.    Filed Vitals:   12/15/12 1517  BP: 132/87  Pulse: 90  Temp: 97.1 F (36.2 C)  TempSrc: Tympanic  Resp: 19      Labs  reviewed: Basic Metabolic Panel:  Recent Labs  91/47/82 05/11/12 08/03/12 10/05/12  NA  --  133* 131* 135*  K  --  3.8 3.6 3.8  BUN  --  34* 28* 34*  CREATININE  --  1.6* 1.7* 2.0*  TSH 1.28  --   --   --    CBC:  Recent Labs  08/03/12 10/05/12 11/30/12  WBC 6.1 6.4 7.6  HGB 11.1* 11.3* 10.5*  HCT 33* 33* 31*  PLT 227 254 245   Assessment/Plan  Problem List Items Addressed This Visit   Anemia     Stable, Hgb  10.6 05/11/12 and 11.1 08/03/12 and 11.4 10/05/12, dc'd Fe and update CBC ine one month scheduled.             BPH (benign prostatic hyperplasia) - Primary     Recurrent urinary retention-Foley inserted 11/04/12 at ED, takes Myrbetriq, Tamsulosin, and Finasteride.             Chronic kidney disease     Baseline creatinine 1.5-2.0        Dementia with behavioral disturbance     Stabilized in SNF and [facility]            Depression     Improved on Effexor 75mg           Hypokalemia     Supplemented with Kcl 75meq--serum K 3.8 10/05/12      Unspecified constipation     Managed with Senokot S II bid           Family/ Staff Communication:  Observe the patient.   Goals of Care: SNF  Labs/tests ordered: none

## 2012-12-15 NOTE — Assessment & Plan Note (Signed)
Managed with Senokot S II bid.    

## 2012-12-15 NOTE — Assessment & Plan Note (Signed)
Supplemented with Kcl 24meq--serum K 3.8 10/05/12

## 2012-12-15 NOTE — Assessment & Plan Note (Signed)
Recurrent urinary retention-Foley inserted 11/04/12 at ED-removed 11/13/12, saw Urology 11/13/12-Ok voiding and f/u in 3 mos, continued Tamsulosin, and Finasteride, off Myrbetriq.

## 2012-12-15 NOTE — Assessment & Plan Note (Signed)
Much improved on Demeclocycline, last serum Na 131 08/03/12 and 135 10/05/12       

## 2012-12-29 ENCOUNTER — Non-Acute Institutional Stay (SKILLED_NURSING_FACILITY): Payer: Medicare Other | Admitting: Nurse Practitioner

## 2012-12-29 ENCOUNTER — Encounter: Payer: Self-pay | Admitting: Nurse Practitioner

## 2012-12-29 DIAGNOSIS — I10 Essential (primary) hypertension: Secondary | ICD-10-CM | POA: Diagnosis not present

## 2012-12-29 DIAGNOSIS — R339 Retention of urine, unspecified: Secondary | ICD-10-CM

## 2012-12-29 DIAGNOSIS — K5900 Constipation, unspecified: Secondary | ICD-10-CM | POA: Diagnosis not present

## 2012-12-29 DIAGNOSIS — E871 Hypo-osmolality and hyponatremia: Secondary | ICD-10-CM | POA: Diagnosis not present

## 2012-12-29 DIAGNOSIS — F0391 Unspecified dementia with behavioral disturbance: Secondary | ICD-10-CM

## 2012-12-29 DIAGNOSIS — N400 Enlarged prostate without lower urinary tract symptoms: Secondary | ICD-10-CM

## 2012-12-29 DIAGNOSIS — K219 Gastro-esophageal reflux disease without esophagitis: Secondary | ICD-10-CM

## 2012-12-29 DIAGNOSIS — F329 Major depressive disorder, single episode, unspecified: Secondary | ICD-10-CM

## 2012-12-29 NOTE — Assessment & Plan Note (Signed)
Improved on Effexor 75mg   

## 2012-12-29 NOTE — Assessment & Plan Note (Addendum)
Hx of BPH with urinary frequency--discontinued Myrbetriq helped-now recurred with no urine out put for >8 hrs and N/V-all resolved after Foley inserted 12/28/12. Clar amber color urine seen today. The patient denied suprapubic discomfort or CVA tenderness or dysuria. He is afebrile. Will continue Foley until f/u Urology. Will obtain UA and C/S to r/o UTI.

## 2012-12-29 NOTE — Assessment & Plan Note (Signed)
Stabilized in SNF and Namenda                     

## 2012-12-29 NOTE — Assessment & Plan Note (Signed)
Much improved on Demeclocycline, last serum Na 131 08/03/12 and 135 10/05/12

## 2012-12-29 NOTE — Assessment & Plan Note (Signed)
Stable on Protanix 40mg  bid and Zantac 75mg 

## 2012-12-29 NOTE — Assessment & Plan Note (Signed)
Recurrent urinary retention-Foley inserted 11/04/12 at ED-removed 11/13/12, saw Urology 11/13/12-Ok voiding and f/u in 3 mos, continued Tamsulosin, and Finasteride, off Myrbetriq. Urine retention recurred 11/3012-relieved with Foley Cath inserted. Will continue Foley until f/u Urology.

## 2012-12-29 NOTE — Assessment & Plan Note (Signed)
Managed with Senokot S II bid.    

## 2012-12-29 NOTE — Assessment & Plan Note (Signed)
Controlled on Amlodipine 5mg   

## 2012-12-29 NOTE — Progress Notes (Signed)
Patient ID: Vincent Potts, male   DOB: [birth year], 79 y.o.   MRN: [id number]  Code Status: DNR  Allergies  Allergen Reactions  . Olanzapine Other (See Comments)    unknown  . Rivastigmine Nausea Only    Chief Complaint  Patient presents with  . Medical Managment of Chronic Issues    urinary retention.   . Acute Visit    HPI: Patient is a 79 y.o. male seen in the SNF at [facility] today for evaluation of urinary retention and his chronic medical conditions.  Problem List Items Addressed This Visit   BPH (benign prostatic hyperplasia)     Recurrent urinary retention-Foley inserted 11/04/12 at ED-removed 11/13/12, saw Urology 11/13/12-Ok voiding and f/u in 3 mos, continued Tamsulosin, and Finasteride, off Myrbetriq. Urine retention recurred 11/3012-relieved with Foley Cath inserted. Will continue Foley until f/u Urology.                   Dementia with behavioral disturbance     Stabilized in SNF and [facility]                  Depression     Improved on Effexor 75mg                 GERD (gastroesophageal reflux disease)     Stable on Protanix 40mg  bid and Zantac 75mg                 Hypertension     Controlled on Amlodipine 5mg                  Hyponatremia     Much improved on Demeclocycline, last serum Na 131 08/03/12 and 135 10/05/12              Unspecified constipation     Managed with Senokot S II bid            Urinary retention - Primary     Hx of BPH with urinary frequency--discontinued Myrbetriq helped-now recurred with no urine out put for >8 hrs and N/V-all resolved after Foley inserted 12/28/12. Clar amber color urine seen today. The patient denied suprapubic discomfort or CVA tenderness or dysuria. He is afebrile. Will continue Foley until f/u Urology. Will obtain UA and C/S to r/o UTI.        Review of Systems:  Review of Systems  Constitutional: Negative for fever, chills, weight  loss, malaise/fatigue and diaphoresis.  HENT: Positive for hearing loss.  Negative for ear pain and neck pain.   Eyes: Negative for blurred vision, double vision, photophobia, pain, discharge and redness.  Respiratory: Negative for cough, sputum production, shortness of breath and wheezing.   Cardiovascular: Negative for chest pain, palpitations, orthopnea, claudication, leg swelling and PND.  Gastrointestinal: Negative for heartburn, nausea, vomiting, abdominal pain, diarrhea and constipation.  Genitourinary: Negative for dysuria, urgency and flank pain. Frequency: Urinary retention 12/28/12-Foley inserted.  Musculoskeletal: Positive for back pain and joint pain. Negative for falls.  Skin: Negative for itching and rash.  Neurological: Negative for dizziness, tremors, speech change, focal weakness, seizures, loss of consciousness and headaches.  Endo/Heme/Allergies: Negative for environmental allergies and polydipsia. Does not bruise/bleed easily.  Psychiatric/Behavioral: Positive for memory loss. Negative for depression and hallucinations. The patient is not nervous/anxious and does not have insomnia.      Past Medical History  Diagnosis Date  . Thyroid disease   . Anemia   . Vitamin D deficiency disease   . Hyponatremia   .  Anxiety   . Parkinsonism   . Hearing loss   . Hypertension   . PVC (premature ventricular contraction)   . CVA (cerebral infarction)   . GERD (gastroesophageal reflux disease)   . Diverticulosis   . Sciatica   . Back pain   . Gait disorder   . Cataract   . Edema   . Keratosis   . Sciatica   . Cystic disease of liver   . Paresthesia   . Inguinal hernia   . Hearing loss   . Stroke 2010    no deficits  . Chronic kidney disease 2013    rhabdo  . H/O hiatal hernia 2008    repaired  . Depression 06/06/2012  . Dementia with behavioral disturbance 06/06/2012   Past Surgical History  Procedure Laterality Date  . Prostate surgery      cancer  . Hernia  repair    . Appendectomy    . Esophagogastroduodenoscopy  10/25/2011    Procedure: ESOPHAGOGASTRODUODENOSCOPY (EGD);  Surgeon: Vertell Novak., MD;  Location: [facility] ENDOSCOPY;  Service: Endoscopy;  Laterality: N/A;   Social History:   reports that he has never smoked. He does not have any smokeless tobacco history on file. He reports that he drinks alcohol. He reports that he does not use illicit drugs.  Family History  Problem Relation Age of Onset  . Hypertension Mother     Medications: Reviewed at [facility]   Physical Exam: Physical Exam  Constitutional: He appears well-developed and well-nourished.  HENT:  Head: Normocephalic and atraumatic. Not macrocephalic.  Nose: rhinorrhea present. Right sinus exhibits no maxillary sinus tenderness and no frontal sinus tenderness. Left sinus exhibits no maxillary sinus tenderness and no frontal sinus tenderness.  Mouth/Throat: Uvula is midline. No oropharyngeal exudate or posterior oropharyngeal edema.  Eyes: EOM are normal. Pupils are equal, round, and reactive to light.  Neck: Normal range of motion. Neck supple. No JVD present. No thyromegaly present.  Cardiovascular: Normal rate and regular rhythm.   Murmur heard.  Systolic murmur is present with a grade of 2/6  Pulmonary/Chest: Effort normal and breath sounds normal. He has no wheezes. He has no rales.  Abdominal: Soft. Bowel sounds are normal. There is no tenderness.  Genitourinary:  Foley Cath since 11/04/12 due to urinary retention-removed. Re-inserted 12/28/12 for urinary retention.  Musculoskeletal: Normal range of motion. He exhibits no edema.  Scoliokyphosis, walking with walker with SBA  Lymphadenopathy:    He has no cervical adenopathy.  Neurological: He is alert. He displays normal reflexes. No cranial nerve deficit. He exhibits normal muscle tone. Coordination normal.  Skin: Skin is warm and dry. No rash noted. No erythema.  Psychiatric: His mood appears not anxious. His affect  is not angry, not blunt and not labile. His speech is not delayed and not slurred. He is not agitated, not aggressive, not slowed, not withdrawn, not actively hallucinating and not combative. Thought content is not paranoid and not delusional. Cognition and memory are impaired. He does not exhibit a depressed mood. He exhibits abnormal recent memory. He is attentive.    Filed Vitals:   12/29/12 1525  BP: 122/81  Pulse: 96  Temp: 97.4 F (36.3 C)  TempSrc: Tympanic  Resp: 15      Labs reviewed: Basic Metabolic Panel:  Recent Labs  41/32/44 05/11/12 08/03/12 10/05/12  NA  --  133* 131* 135*  K  --  3.8 3.6 3.8  BUN  --  34* 28* 34*  CREATININE  --  1.6* 1.7* 2.0*  TSH 1.28  --   --   --    CBC:  Recent Labs  08/03/12 10/05/12 11/30/12  WBC 6.1 6.4 7.6  HGB 11.1* 11.3* 10.5*  HCT 33* 33* 31*  PLT 227 254 245   Assessment/Plan  Problem List Items Addressed This Visit   Anemia     Stable, Hgb 10.6 05/11/12 and 11.1 08/03/12 and 11.4 10/05/12, dc'd Fe and update CBC ine one month scheduled.             BPH (benign prostatic hyperplasia) - Primary     Recurrent urinary retention-Foley inserted 11/04/12 at ED, takes Myrbetriq, Tamsulosin, and Finasteride.             Chronic kidney disease     Baseline creatinine 1.5-2.0        Dementia with behavioral disturbance     Stabilized in SNF and [facility]            Depression     Improved on Effexor 75mg           Hypokalemia     Supplemented with Kcl 65meq--serum K 3.8 10/05/12      Unspecified constipation     Managed with Senokot S II bid           Family/ Staff Communication:  Observe the patient.   Goals of Care: SNF  Labs/tests ordered: Cath UA C/S

## 2012-12-30 DIAGNOSIS — N390 Urinary tract infection, site not specified: Secondary | ICD-10-CM | POA: Diagnosis not present

## 2013-01-09 DIAGNOSIS — R3915 Urgency of urination: Secondary | ICD-10-CM | POA: Diagnosis not present

## 2013-01-09 DIAGNOSIS — R350 Frequency of micturition: Secondary | ICD-10-CM | POA: Diagnosis not present

## 2013-01-23 ENCOUNTER — Encounter: Payer: Self-pay | Admitting: Nurse Practitioner

## 2013-01-23 ENCOUNTER — Non-Acute Institutional Stay (SKILLED_NURSING_FACILITY): Payer: Medicare Other | Admitting: Nurse Practitioner

## 2013-01-23 DIAGNOSIS — D649 Anemia, unspecified: Secondary | ICD-10-CM

## 2013-01-23 DIAGNOSIS — F32A Depression, unspecified: Secondary | ICD-10-CM

## 2013-01-23 DIAGNOSIS — K5900 Constipation, unspecified: Secondary | ICD-10-CM

## 2013-01-23 DIAGNOSIS — I10 Essential (primary) hypertension: Secondary | ICD-10-CM

## 2013-01-23 DIAGNOSIS — R339 Retention of urine, unspecified: Secondary | ICD-10-CM

## 2013-01-23 DIAGNOSIS — N189 Chronic kidney disease, unspecified: Secondary | ICD-10-CM

## 2013-01-23 DIAGNOSIS — E871 Hypo-osmolality and hyponatremia: Secondary | ICD-10-CM

## 2013-01-23 DIAGNOSIS — E876 Hypokalemia: Secondary | ICD-10-CM

## 2013-01-23 DIAGNOSIS — K219 Gastro-esophageal reflux disease without esophagitis: Secondary | ICD-10-CM

## 2013-01-23 DIAGNOSIS — F329 Major depressive disorder, single episode, unspecified: Secondary | ICD-10-CM

## 2013-01-23 DIAGNOSIS — F0391 Unspecified dementia with behavioral disturbance: Secondary | ICD-10-CM

## 2013-01-23 DIAGNOSIS — F03918 Unspecified dementia, unspecified severity, with other behavioral disturbance: Secondary | ICD-10-CM

## 2013-01-23 DIAGNOSIS — F3289 Other specified depressive episodes: Secondary | ICD-10-CM

## 2013-01-23 DIAGNOSIS — M549 Dorsalgia, unspecified: Secondary | ICD-10-CM

## 2013-01-23 NOTE — Assessment & Plan Note (Signed)
Supplemented with Kcl 60meq--serum K 3.8 10/05/12, update CMP

## 2013-01-23 NOTE — Assessment & Plan Note (Signed)
Stabilized in SNF and Namenda                     

## 2013-01-23 NOTE — Assessment & Plan Note (Signed)
Much improved on Demeclocycline, last serum Na 131 08/03/12 and 135 10/05/12. Update CMP

## 2013-01-23 NOTE — Assessment & Plan Note (Signed)
Hx of BPH with urinary frequency--discontinued Myrbetriq helped-now recurred with no urine out put for >8 hrs and N/V-all resolved after Foley inserted 12/28/12. Clear amber color urine seen today. Will continue Foley until f/u Urology. Urine culture 01/01/13 showed no growth.

## 2013-01-23 NOTE — Assessment & Plan Note (Signed)
Stable on Protanix 40mg bid and Zantac 75mg 

## 2013-01-23 NOTE — Assessment & Plan Note (Signed)
Improved on Effexor 75mg   

## 2013-01-23 NOTE — Assessment & Plan Note (Signed)
Tylenol nightly and prn is adequate.    

## 2013-01-23 NOTE — Assessment & Plan Note (Signed)
Controlled on Amlodipine 5mg   

## 2013-01-23 NOTE — Assessment & Plan Note (Signed)
Stable, Hgb 10-11, takes Fe 325mg.                  

## 2013-01-23 NOTE — Assessment & Plan Note (Signed)
Update renal function.  

## 2013-01-23 NOTE — Progress Notes (Signed)
Patient ID: Vincent Potts, male   DOB: 1925/02/11, 79 y.o.   MRN: [id number]  Code Status: DNR  Allergies  Allergen Reactions  . Olanzapine Other (See Comments)    unknown  . Rivastigmine Nausea Only    Chief Complaint  Patient presents with  . Medical Managment of Chronic Issues    HPI: Patient is a 79 y.o. male seen in the SNF at [facility] today for evaluation of his chronic medical conditions.  Problem List Items Addressed This Visit   Anemia     Stable, Hgb 10-11, takes Fe 325mg .                   Back pain     Tylenol nightly and prn is adequate.     Chronic kidney disease     Update renal function.     Dementia with behavioral disturbance     Stabilized in SNF and [facility]                    Depression     Improved on Effexor 75mg                   GERD (gastroesophageal reflux disease)     Stable on Protanix 40mg  bid and Zantac 75mg                   Hypertension     Controlled on Amlodipine 5mg                    Hypokalemia     Supplemented with Kcl 72meq--serum K 3.8 10/05/12, update CMP          Hyponatremia     Much improved on Demeclocycline, last serum Na 131 08/03/12 and 135 10/05/12. Update CMP                Unspecified constipation     Managed with Senokot S II bid              Urinary retention - Primary     Hx of BPH with urinary frequency--discontinued Myrbetriq helped-now recurred with no urine out put for >8 hrs and N/V-all resolved after Foley inserted 12/28/12. Clear amber color urine seen today. Will continue Foley until f/u Urology. Urine culture 01/01/13 showed no growth.          Review of Systems:  Review of Systems  Constitutional: Negative for fever, chills, weight loss, malaise/fatigue and diaphoresis.  HENT: Positive for hearing loss.  Negative for ear pain and neck pain.   Eyes: Negative for blurred vision, double vision,  photophobia, pain, discharge and redness.  Respiratory: Negative for cough, sputum production, shortness of breath and wheezing.   Cardiovascular: Negative for chest pain, palpitations, orthopnea, claudication, leg swelling and PND.  Gastrointestinal: Negative for heartburn, nausea, vomiting, abdominal pain, diarrhea and constipation.  Genitourinary: Negative for dysuria, urgency and flank pain. Frequency: Urinary retention 12/28/12-Foley inserted.  Musculoskeletal: Positive for back pain and joint pain. Negative for falls.  Skin: Negative for itching and rash.  Neurological: Negative for dizziness, tremors, speech change, focal weakness, seizures, loss of consciousness and headaches.  Endo/Heme/Allergies: Negative for environmental allergies and polydipsia. Does not bruise/bleed easily.  Psychiatric/Behavioral: Positive for memory loss. Negative for depression and hallucinations. The patient is not nervous/anxious and does not have insomnia.      Past Medical History  Diagnosis Date  . Thyroid disease   . Anemia   .  Vitamin D deficiency disease   . Hyponatremia   . Anxiety   . Parkinsonism   . Hearing loss   . Hypertension   . PVC (premature ventricular contraction)   . CVA (cerebral infarction)   . GERD (gastroesophageal reflux disease)   . Diverticulosis   . Sciatica   . Back pain   . Gait disorder   . Cataract   . Edema   . Keratosis   . Sciatica   . Cystic disease of liver   . Paresthesia   . Inguinal hernia   . Hearing loss   . Stroke 2010    no deficits  . Chronic kidney disease 2013    rhabdo  . H/O hiatal hernia 2008    repaired  . Depression 06/06/2012  . Dementia with behavioral disturbance 06/06/2012   Past Surgical History  Procedure Laterality Date  . Prostate surgery      cancer  . Hernia repair    . Appendectomy    . Esophagogastroduodenoscopy  10/25/2011    Procedure: ESOPHAGOGASTRODUODENOSCOPY (EGD);  Surgeon: Vertell Novak., MD;  Location: [facility]  ENDOSCOPY;  Service: Endoscopy;  Laterality: N/A;   Social History:   reports that he has never smoked. He does not have any smokeless tobacco history on file. He reports that he drinks alcohol. He reports that he does not use illicit drugs.  Family History  Problem Relation Age of Onset  . Hypertension Mother     Medications: Reviewed at [facility]   Physical Exam: Physical Exam  Constitutional: He appears well-developed and well-nourished.  HENT:  Head: Normocephalic and atraumatic. Not macrocephalic.  Nose: rhinorrhea present. Right sinus exhibits no maxillary sinus tenderness and no frontal sinus tenderness. Left sinus exhibits no maxillary sinus tenderness and no frontal sinus tenderness.  Mouth/Throat: Uvula is midline. No oropharyngeal exudate or posterior oropharyngeal edema.  Eyes: EOM are normal. Pupils are equal, round, and reactive to light.  Neck: Normal range of motion. Neck supple. No JVD present. No thyromegaly present.  Cardiovascular: Normal rate and regular rhythm.   Murmur heard.  Systolic murmur is present with a grade of 2/6  Pulmonary/Chest: Effort normal and breath sounds normal. He has no wheezes. He has no rales.  Abdominal: Soft. Bowel sounds are normal. There is no tenderness.  Genitourinary:  Foley Cath since 11/04/12 due to urinary retention-removed. Re-inserted 12/28/12 for urinary retention.  Musculoskeletal: Normal range of motion. He exhibits no edema.  Scoliokyphosis, walking with walker with SBA  Lymphadenopathy:    He has no cervical adenopathy.  Neurological: He is alert. He displays normal reflexes. No cranial nerve deficit. He exhibits normal muscle tone. Coordination normal.  Skin: Skin is warm and dry. No rash noted. No erythema.  Psychiatric: His mood appears not anxious. His affect is not angry, not blunt and not labile. His speech is not delayed and not slurred. He is not agitated, not aggressive, not slowed, not withdrawn, not actively  hallucinating and not combative. Thought content is not paranoid and not delusional. Cognition and memory are impaired. He does not exhibit a depressed mood. He exhibits abnormal recent memory. He is attentive.    Filed Vitals:   01/23/13 1152  BP: 136/68  Pulse: 78  Temp: 98.4 F (36.9 C)  TempSrc: Tympanic  Resp: 18      Labs reviewed: Basic Metabolic Panel:  Recent Labs  16/10/96 08/03/12 10/05/12  NA 133* 131* 135*  K 3.8 3.6 3.8  BUN 34* 28* 34*  CREATININE 1.6* 1.7* 2.0*   CBC:  Recent Labs  08/03/12 10/05/12 11/30/12  WBC 6.1 6.4 7.6  HGB 11.1* 11.3* 10.5*  HCT 33* 33* 31*  PLT 227 254 245   Assessment/Plan  Problem List Items Addressed This Visit   Anemia     Stable, Hgb 10.6 05/11/12 and 11.1 08/03/12 and 11.4 10/05/12, dc'd Fe and update CBC ine one month scheduled.             BPH (benign prostatic hyperplasia) - Primary     Recurrent urinary retention-Foley inserted 11/04/12 at ED, takes Myrbetriq, Tamsulosin, and Finasteride.             Chronic kidney disease     Baseline creatinine 1.5-2.0        Dementia with behavioral disturbance     Stabilized in SNF and [facility]            Depression     Improved on Effexor 75mg           Hypokalemia     Supplemented with Kcl 61meq--serum K 3.8 10/05/12      Unspecified constipation     Managed with Senokot S II bid           Family/ Staff Communication:  Observe the patient.   Goals of Care: SNF  Labs/tests ordered: CMP

## 2013-01-23 NOTE — Assessment & Plan Note (Signed)
Managed with Senokot S II bid.    

## 2013-01-29 DIAGNOSIS — I10 Essential (primary) hypertension: Secondary | ICD-10-CM | POA: Diagnosis not present

## 2013-01-29 LAB — HEPATIC FUNCTION PANEL
ALT: 9 U/L — AB (ref 10–40)
AST: 13 U/L — AB (ref 14–40)
Bilirubin, Total: 0.5 mg/dL

## 2013-01-29 LAB — BASIC METABOLIC PANEL
Glucose: 91 mg/dL
Potassium: 4.4 mmol/L (ref 3.4–5.3)

## 2013-01-30 ENCOUNTER — Non-Acute Institutional Stay (SKILLED_NURSING_FACILITY): Payer: Medicare Other | Admitting: Nurse Practitioner

## 2013-01-30 ENCOUNTER — Encounter: Payer: Self-pay | Admitting: Nurse Practitioner

## 2013-01-30 DIAGNOSIS — K5900 Constipation, unspecified: Secondary | ICD-10-CM

## 2013-01-30 DIAGNOSIS — W19XXXD Unspecified fall, subsequent encounter: Secondary | ICD-10-CM

## 2013-01-30 DIAGNOSIS — F329 Major depressive disorder, single episode, unspecified: Secondary | ICD-10-CM

## 2013-01-30 DIAGNOSIS — F32A Depression, unspecified: Secondary | ICD-10-CM

## 2013-01-30 DIAGNOSIS — I10 Essential (primary) hypertension: Secondary | ICD-10-CM

## 2013-01-30 DIAGNOSIS — R7989 Other specified abnormal findings of blood chemistry: Secondary | ICD-10-CM | POA: Diagnosis not present

## 2013-01-30 DIAGNOSIS — W19XXXA Unspecified fall, initial encounter: Secondary | ICD-10-CM

## 2013-01-30 DIAGNOSIS — E876 Hypokalemia: Secondary | ICD-10-CM

## 2013-01-30 DIAGNOSIS — K219 Gastro-esophageal reflux disease without esophagitis: Secondary | ICD-10-CM

## 2013-01-30 DIAGNOSIS — N400 Enlarged prostate without lower urinary tract symptoms: Secondary | ICD-10-CM | POA: Diagnosis not present

## 2013-01-30 DIAGNOSIS — N189 Chronic kidney disease, unspecified: Secondary | ICD-10-CM

## 2013-01-30 DIAGNOSIS — F0391 Unspecified dementia with behavioral disturbance: Secondary | ICD-10-CM

## 2013-01-30 DIAGNOSIS — F03918 Unspecified dementia, unspecified severity, with other behavioral disturbance: Secondary | ICD-10-CM

## 2013-01-30 DIAGNOSIS — R945 Abnormal results of liver function studies: Secondary | ICD-10-CM

## 2013-01-30 DIAGNOSIS — E871 Hypo-osmolality and hyponatremia: Secondary | ICD-10-CM

## 2013-01-30 NOTE — Assessment & Plan Note (Signed)
Stabilized in SNF and Namenda                     

## 2013-01-30 NOTE — Assessment & Plan Note (Signed)
Managed with Senokot S II bid.    

## 2013-01-30 NOTE — Progress Notes (Signed)
Patient ID: Vincent Potts, male   DOB: 08/16/1924, 79 y.o.   MRN: [id number]   Code Status: DNR  Allergies  Allergen Reactions  . Olanzapine Other (See Comments)    unknown  . Rivastigmine Nausea Only    Chief Complaint  Patient presents with  . Medical Managment of Chronic Issues    s/p fall, hit head per the patient.   . Acute Visit    HPI: Patient is a 79 y.o. male seen in the SNF at [facility] today for evaluation of s/p fall, skin tears left wrist/arm,  and other chronic medical conditions.  Problem List Items Addressed This Visit   Unspecified constipation     Managed with Senokot S II bid                Hyponatremia - Primary     Much improved on Demeclocycline, last serum Na 131 08/03/12 and 135 10/05/12 and 136 01/29/13. Will decrease Demeclocycline to 150mg  po daily, update BMP in 2 weeks.                   Hypokalemia     Supplemented with Kcl 56meq--serum K 3.8 10/05/12 and 4.4 01/29/13            Hypertension     Controlled on Amlodipine 5mg                      GERD (gastroesophageal reflux disease)     Stable on Protanix 40mg  bid and Zantac 75mg -able to discontinue Mylanta prn due to not used.                     Relevant Medications      omeprazole (PRILOSEC) 20 MG capsule   Fall     The resident slipped getting out ot shower and sustained 3 small skin tears to the left wrist/forearm. The patient insisted that he hit his head which staff were not aware of the event. No bruise or bump or reduced ROM of neck or BUE. No focal neurological deficit noted. 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 He wears hearing aid in the left ear. Will do nero check and monitor the patient.     Depression     Improved on Effexor 75mg                     Dementia with behavioral disturbance     Stabilized  in SNF and [facility]                      Chronic kidney disease     Creatinine 1.88 01/29/13, 1.96 10/05/12      BPH (benign prostatic hyperplasia)     Recurrent urinary retention-Foley inserted 11/04/12 at ED-removed 11/13/12, saw Urology 11/13/12-Ok voiding and f/u in 3 mos, continued Tamsulosin, and Finasteride, off Myrbetriq. Urine retention recurred 11/3012-relieved with Foley Cath inserted. Will continue Foley until f/u Urology.                     Abnormal LFTs     Resolved LFT 01/29/13       Review of Systems  Constitutional: Negative for fever, chills, weight loss, malaise/fatigue and diaphoresis.  HENT: Positive for congestion, hearing loss and sore throat. Negative for ear pain.   Eyes: Negative for  blurred vision, double vision, photophobia, pain, discharge and redness.  Respiratory: Negative for cough, sputum production, shortness of breath and wheezing.   Cardiovascular: Negative for chest pain, palpitations, orthopnea, claudication, leg swelling and PND.  Gastrointestinal: Negative for heartburn, nausea, vomiting, abdominal pain, diarrhea and constipation.  Genitourinary: Negative for dysuria, urgency and flank pain. Frequency: much better.       Foley Cath  Musculoskeletal: Positive for back pain and joint pain. Negative for falls and neck pain.  Skin: Negative for itching and rash.       3 small skin tears left wrist and forearm.   Neurological: Negative for dizziness, tremors, speech change, focal weakness, seizures, loss of consciousness and headaches.  Endo/Heme/Allergies: Negative for environmental allergies and polydipsia. Does not bruise/bleed easily.  Psychiatric/Behavioral: Positive for memory loss. Negative for depression and hallucinations. The patient is not nervous/anxious and does not have insomnia.      Past Medical History  Diagnosis Date  . Thyroid disease   . Anemia   . Vitamin D deficiency disease   . Hyponatremia   .  Anxiety   . Parkinsonism   . Hearing loss   . Hypertension   . PVC (premature ventricular contraction)   . CVA (cerebral infarction)   . GERD (gastroesophageal reflux disease)   . Diverticulosis   . Sciatica   . Back pain   . Gait disorder   . Cataract   . Edema   . Keratosis   . Sciatica   . Cystic disease of liver   . Paresthesia   . Inguinal hernia   . Hearing loss   . Stroke 2010    no deficits  . Chronic kidney disease 2013    rhabdo  . H/O hiatal hernia 2008    repaired  . Depression 06/06/2012  . Dementia with behavioral disturbance 06/06/2012   Past Surgical History  Procedure Laterality Date  . Prostate surgery      cancer  . Hernia repair    . Appendectomy    . Esophagogastroduodenoscopy  10/25/2011    Procedure: ESOPHAGOGASTRODUODENOSCOPY (EGD);  Surgeon: Vertell Novak., MD;  Location: [facility] ENDOSCOPY;  Service: Endoscopy;  Laterality: N/A;   Social History:   reports that he has never smoked. He does not have any smokeless tobacco history on file. He reports that he drinks alcohol. He reports that he does not use illicit drugs.  Family History  Problem Relation Age of Onset  . Hypertension Mother     Medications: Patient's Medications  New Prescriptions   No medications on file  Previous Medications   ACETAMINOPHEN (TYLENOL) 325 MG TABLET    Take 650 mg by mouth every 4 (four) hours as needed.   ACETAMINOPHEN (TYLENOL) 500 MG TABLET    Take 500 mg by mouth at bedtime. To be given with Restoril   AMLODIPINE (NORVASC) 5 MG TABLET    Take 1 tablet (5 mg total) by mouth every morning.   CARBOXYMETHYLCELLULOSE (REFRESH TEARS) 0.5 % SOLN    Place 1 drop into both eyes 3 (three) times daily.   CHOLECALCIFEROL (VITAMIN D) 1000 UNITS TABLET    Take 1,000 Units by mouth every morning.    CYCLOSPORINE (RESTASIS) 0.05 % OPHTHALMIC EMULSION    Place 1 drop into both eyes 2 (two) times daily.    DEMECLOCYCLINE (DECLOMYCIN) 150 MG TABLET    Take 150 mg by mouth  daily.    FEEDING SUPPLEMENT (ENSURE CLINICAL STRENGTH) LIQD    Take 237  mLs by mouth 2 (two) times daily with a meal.   FERROUS SULFATE 325 (65 FE) MG TABLET    Take 325 mg by mouth daily with breakfast.   FINASTERIDE (PROSCAR) 5 MG TABLET    Take 5 mg by mouth daily.   HYPROMELLOSE (GENTEAL OP)    Apply 1 drop to eye at bedtime.   MAGNESIUM HYDROXIDE (MILK OF MAGNESIA) 400 MG/5ML SUSPENSION    Take 30 mLs by mouth daily as needed. Please take 30 ml by mouth for the next three days (util 8/31) then you can take as needed for constipation.   MELATONIN 3 MG TABS    Take 3 mg by mouth at bedtime.   MEMANTINE HCL ER (NAMENDA XR) 28 MG CP24    Take 10 mg by mouth 2 (two) times daily.    OMEPRAZOLE (PRILOSEC) 20 MG CAPSULE    Take 20 mg by mouth daily.   OXYCODONE (OXY-IR) 5 MG CAPSULE    Take 5 mg by mouth every 4 (four) hours as needed. pain   POTASSIUM CHLORIDE SA (K-DUR,KLOR-CON) 20 MEQ TABLET    Take 20 mEq by mouth daily.   RANITIDINE (ZANTAC) 150 MG TABLET    Take 150 mg by mouth at bedtime.   SENNA (SENOKOT) 8.6 MG TABLET    Take 2 tablets by mouth 2 (two) times daily.    TAMSULOSIN HCL (FLOMAX) 0.4 MG CAPS    Take 0.4 mg by mouth every morning.    VENLAFAXINE (EFFEXOR) 75 MG TABLET    Take 75 mg by mouth daily.  Modified Medications   No medications on file  Discontinued Medications   PANTOPRAZOLE (PROTONIX) 40 MG TABLET    Take 40 mg by mouth daily.     Physical Exam  Constitutional: He appears well-developed and well-nourished.  HENT:  Head: Normocephalic and atraumatic. Not macrocephalic.  Nose: Mucosal edema and rhinorrhea present. Right sinus exhibits no maxillary sinus tenderness and no frontal sinus tenderness. Left sinus exhibits no maxillary sinus tenderness and no frontal sinus tenderness.  Mouth/Throat: Uvula is midline. Posterior oropharyngeal erythema present. No oropharyngeal exudate or posterior oropharyngeal edema.  Eyes: EOM are normal. Pupils are equal, round, and  reactive to light.  Neck: Normal range of motion. Neck supple. No JVD present. No thyromegaly present.  Cardiovascular: Normal rate and regular rhythm.   Murmur heard.  Systolic murmur is present with a grade of 2/6  Pulmonary/Chest: Effort normal and breath sounds normal. He has no wheezes. He has no rales.  Abdominal: Soft. Bowel sounds are normal. There is no tenderness.  Genitourinary:  Foley Cath since 11/04/12 due to urinary retention.   Musculoskeletal: Normal range of motion. He exhibits no edema.  Scoliokyphosis, walking with walker with SBA  Lymphadenopathy:    He has no cervical adenopathy.  Neurological: He is alert. He displays normal reflexes. No cranial nerve deficit. He exhibits normal muscle tone. Coordination normal.  Skin: Skin is warm and dry. No rash noted. No erythema.  3 small skin tears left wrist/forearm  Psychiatric: His mood appears not anxious. His affect is not angry, not blunt and not labile. His speech is not delayed and not slurred. He is not agitated, not aggressive, not slowed, not withdrawn, not actively hallucinating and not combative. Thought content is not paranoid and not delusional. Cognition and memory are impaired. He expresses impulsivity. He does not exhibit a depressed mood. He exhibits abnormal recent memory. He is attentive.    Filed Vitals:  01/30/13 1946  BP: 111/81  Pulse: 78  Temp: 97.4 F (36.3 C)  TempSrc: Tympanic  Resp: 16      Labs reviewed: Basic Metabolic Panel:  Recent Labs  16/10/96 10/05/12 01/29/13  NA 131* 135* 136*  K 3.6 3.8 4.4  BUN 28* 34* 25*  CREATININE 1.7* 2.0* 1.9*   Liver Function Tests:  Recent Labs  01/29/13  AST 13*  ALT 9*  ALKPHOS 108   CBC:  Recent Labs  08/03/12 10/05/12 11/30/12  WBC 6.1 6.4 7.6  HGB 11.1* 11.3* 10.5*  HCT 33* 33* 31*  PLT 227 254 245       Assessment/Plan Hyponatremia Much improved on Demeclocycline, last serum Na 131 08/03/12 and 135 10/05/12 and 136 01/29/13.  Will decrease Demeclocycline to 150mg  po daily, update BMP in 2 weeks.                 Abnormal LFTs Resolved LFT 01/29/13  BPH (benign prostatic hyperplasia) Recurrent urinary retention-Foley inserted 11/04/12 at ED-removed 11/13/12, saw Urology 11/13/12-Ok voiding and f/u in 3 mos, continued Tamsulosin, and Finasteride, off Myrbetriq. Urine retention recurred 11/3012-relieved with Foley Cath inserted. Will continue Foley until f/u Urology.                   Depression Improved on Effexor 75mg                   Dementia with behavioral disturbance Stabilized in SNF and [facility]                    Chronic kidney disease Creatinine 1.88 01/29/13, 1.96 10/05/12    GERD (gastroesophageal reflux disease) Stable on Protanix 40mg  bid and Zantac 75mg -able to discontinue Mylanta prn due to not used.                   Hypertension Controlled on Amlodipine 5mg                    Unspecified constipation Managed with Senokot S II bid              Hypokalemia Supplemented with Kcl 66meq--serum K 3.8 10/05/12 and 4.4 01/29/13          Fall The resident slipped getting out ot shower and sustained 3 small skin tears to the left wrist/forearm. The patient insisted that he hit his head which staff were not aware of the event. No bruise or bump or reduced ROM of neck or BUE. No focal neurological deficit noted. 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 He wears hearing aid in the left ear. Will do nero check and monitor the patient.     Family/ Staff Communication: observe the patient.   Goals of Care: SNF  Labs/tests ordered: BMP in 2 weeks.

## 2013-01-30 NOTE — Assessment & Plan Note (Signed)
Improved on Effexor 75mg   

## 2013-01-30 NOTE — Assessment & Plan Note (Signed)
Resolved LFT 01/29/13

## 2013-01-30 NOTE — Assessment & Plan Note (Signed)
Supplemented with Kcl 103meq--serum K 3.8 10/05/12 and 4.4 01/29/13

## 2013-01-30 NOTE — Assessment & Plan Note (Signed)
Controlled on Amlodipine 5mg   

## 2013-01-30 NOTE — Assessment & Plan Note (Signed)
Recurrent urinary retention-Foley inserted 11/04/12 at ED-removed 11/13/12, saw Urology 11/13/12-Ok voiding and f/u in 3 mos, continued Tamsulosin, and Finasteride, off Myrbetriq. Urine retention recurred 11/3012-relieved with Foley Cath inserted. Will continue Foley until f/u Urology.                

## 2013-01-30 NOTE — Assessment & Plan Note (Signed)
Creatinine 1.88 01/29/13, 1.96 10/05/12

## 2013-01-30 NOTE — Assessment & Plan Note (Signed)
The resident slipped getting out ot shower and sustained 3 small skin tears to the left wrist/forearm. The patient insisted that he hit his head which staff were not aware of the event. No bruise or bump or reduced ROM of neck or BUE. No focal neurological deficit noted. 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 He wears hearing aid in the left ear. Will do nero check and monitor the patient.

## 2013-01-30 NOTE — Assessment & Plan Note (Addendum)
Stable on Protanix 40mg  bid and Zantac 75mg -able to discontinue Mylanta prn due to not used.

## 2013-01-30 NOTE — Assessment & Plan Note (Signed)
Much improved on Demeclocycline, last serum Na 131 08/03/12 and 135 10/05/12 and 136 01/29/13. Will decrease Demeclocycline to 150mg  po daily, update BMP in 2 weeks.

## 2013-02-12 DIAGNOSIS — I10 Essential (primary) hypertension: Secondary | ICD-10-CM | POA: Diagnosis not present

## 2013-02-12 LAB — BASIC METABOLIC PANEL
BUN: 20 mg/dL (ref 4–21)
CREATININE: 1.7 mg/dL — AB (ref 0.6–1.3)
GLUCOSE: 81 mg/dL
POTASSIUM: 4.3 mmol/L (ref 3.4–5.3)
SODIUM: 137 mmol/L (ref 137–147)

## 2013-02-13 DIAGNOSIS — R339 Retention of urine, unspecified: Secondary | ICD-10-CM | POA: Diagnosis not present

## 2013-02-26 DIAGNOSIS — R339 Retention of urine, unspecified: Secondary | ICD-10-CM | POA: Diagnosis not present

## 2013-02-26 DIAGNOSIS — R350 Frequency of micturition: Secondary | ICD-10-CM | POA: Diagnosis not present

## 2013-02-26 DIAGNOSIS — N3020 Other chronic cystitis without hematuria: Secondary | ICD-10-CM | POA: Diagnosis not present

## 2013-02-26 DIAGNOSIS — C61 Malignant neoplasm of prostate: Secondary | ICD-10-CM | POA: Diagnosis not present

## 2013-03-09 ENCOUNTER — Non-Acute Institutional Stay (SKILLED_NURSING_FACILITY): Payer: Medicare Other | Admitting: Nurse Practitioner

## 2013-03-09 ENCOUNTER — Encounter: Payer: Self-pay | Admitting: Nurse Practitioner

## 2013-03-09 DIAGNOSIS — N400 Enlarged prostate without lower urinary tract symptoms: Secondary | ICD-10-CM

## 2013-03-09 DIAGNOSIS — F3289 Other specified depressive episodes: Secondary | ICD-10-CM

## 2013-03-09 DIAGNOSIS — K5900 Constipation, unspecified: Secondary | ICD-10-CM | POA: Diagnosis not present

## 2013-03-09 DIAGNOSIS — E871 Hypo-osmolality and hyponatremia: Secondary | ICD-10-CM | POA: Diagnosis not present

## 2013-03-09 DIAGNOSIS — F32A Depression, unspecified: Secondary | ICD-10-CM

## 2013-03-09 DIAGNOSIS — M549 Dorsalgia, unspecified: Secondary | ICD-10-CM

## 2013-03-09 DIAGNOSIS — F329 Major depressive disorder, single episode, unspecified: Secondary | ICD-10-CM | POA: Diagnosis not present

## 2013-03-09 DIAGNOSIS — I10 Essential (primary) hypertension: Secondary | ICD-10-CM

## 2013-03-09 DIAGNOSIS — F0391 Unspecified dementia with behavioral disturbance: Secondary | ICD-10-CM

## 2013-03-09 DIAGNOSIS — K219 Gastro-esophageal reflux disease without esophagitis: Secondary | ICD-10-CM

## 2013-03-09 DIAGNOSIS — D649 Anemia, unspecified: Secondary | ICD-10-CM

## 2013-03-09 DIAGNOSIS — F03918 Unspecified dementia, unspecified severity, with other behavioral disturbance: Secondary | ICD-10-CM

## 2013-03-09 NOTE — Assessment & Plan Note (Signed)
Stabilized in SNF and [facility]

## 2013-03-09 NOTE — Assessment & Plan Note (Signed)
Improved on Effexor 75mg   

## 2013-03-09 NOTE — Assessment & Plan Note (Signed)
Controlled on Amlodipine 5mg   

## 2013-03-09 NOTE — Assessment & Plan Note (Signed)
Tylenol nightly and prn is adequate.

## 2013-03-09 NOTE — Assessment & Plan Note (Addendum)
Recurrent urinary retention-Foley inserted 11/04/12 at ED-successfully removed--continued Tamsulosin and Finasteride    

## 2013-03-09 NOTE — Assessment & Plan Note (Signed)
Stable, Hgb 10-11, takes Fe 325mg .

## 2013-03-09 NOTE — Progress Notes (Signed)
Patient ID: Vincent Potts, male   DOB: 07-08-1924, 80 y.o.   MRN: [id number]   Code Status: DNR  Allergies  Allergen Reactions  . Olanzapine Other (See Comments)    unknown  . Rivastigmine Nausea Only    Chief Complaint  Patient presents with  . Medical Managment of Chronic Issues    HPI: Patient is a 80 y.o. male seen in the SNF at [facility] today for evaluation of chronic medical conditions.  Problem List Items Addressed This Visit   Hypertension     Controlled on Amlodipine 5mg                        Back pain     Tylenol nightly and prn is adequate.       GERD (gastroesophageal reflux disease)     Stable on Omeprazole 20mg   and Zantac 75mg -able to discontinue Mylanta prn due to not used.                       Relevant Medications      magnesium hydroxide (MILK OF MAGNESIA) 400 MG/5ML suspension   Anemia     Stable, Hgb 10-11, takes Fe 325mg .                     Hyponatremia - Primary     Resolved, Na 137 02/12/13 2 weeks after Demeclocycline was decreased to 150mg  daily. Will Dc Demeclocycline. F/u BMP in 2 weeks.                     Depression     Improved on Effexor 75mg                       Dementia with behavioral disturbance     Stabilized in SNF and [facility]                        Unspecified constipation     Managed with Senokot S II bid and MOM q 3days and daily prn.                   BPH (benign prostatic hyperplasia)     Recurrent urinary retention-Foley inserted 11/04/12 at ED-successfully removed--continued Tamsulosin and Finasteride                         Review of Systems  Constitutional: Negative for fever, chills, weight loss, malaise/fatigue and diaphoresis.  HENT: Positive for hearing loss. Negative for congestion, ear pain and sore throat.   Eyes: Negative for blurred vision, double vision,  photophobia, pain, discharge and redness.  Respiratory: Negative for cough, sputum production, shortness of breath and wheezing.   Cardiovascular: Negative for chest pain, palpitations, orthopnea, claudication, leg swelling and PND.  Gastrointestinal: Negative for heartburn, nausea, vomiting, abdominal pain, diarrhea and constipation.  Genitourinary: Negative for dysuria, urgency and flank pain. Frequency: much better.  Musculoskeletal: Positive for back pain and joint pain. Negative for falls and neck pain.  Skin: Negative for itching and rash.  Neurological: Negative for dizziness, tremors, speech change, focal weakness, seizures, loss of consciousness and headaches.  Endo/Heme/Allergies: Negative for environmental allergies and polydipsia. Does not bruise/bleed easily.  Psychiatric/Behavioral: Positive for memory loss. Negative for depression and hallucinations. The patient is not nervous/anxious and does not have insomnia.      Past Medical History  Diagnosis Date  . Thyroid disease   . Anemia   . Vitamin D deficiency disease   . Hyponatremia   . Anxiety   . Parkinsonism   . Hearing loss   . Hypertension   . PVC (premature ventricular contraction)   . CVA (cerebral infarction)   . GERD (gastroesophageal reflux disease)   . Diverticulosis   . Sciatica   . Back pain   . Gait disorder   . Cataract   . Edema   . Keratosis   . Sciatica   . Cystic disease of liver   . Paresthesia   . Inguinal hernia   . Hearing loss   . Stroke 2010    no deficits  . Chronic kidney disease 2013    rhabdo  . H/O hiatal hernia 2008    repaired  . Depression 06/06/2012  . Dementia with behavioral disturbance 06/06/2012   Past Surgical History  Procedure Laterality Date  . Prostate surgery      cancer  . Hernia repair    . Appendectomy    . Esophagogastroduodenoscopy  10/25/2011    Procedure: ESOPHAGOGASTRODUODENOSCOPY (EGD);  Surgeon: Winfield Cunas., MD;  Location: [facility] ENDOSCOPY;   Service: Endoscopy;  Laterality: N/A;   Social History:   reports that he has never smoked. He does not have any smokeless tobacco history on file. He reports that he drinks alcohol. He reports that he does not use illicit drugs.  Family History  Problem Relation Age of Onset  . Hypertension Mother     Medications: Patient's Medications  New Prescriptions   No medications on file  Previous Medications   ACETAMINOPHEN (TYLENOL) 325 MG TABLET    Take 650 mg by mouth every 4 (four) hours as needed.   ACETAMINOPHEN (TYLENOL) 500 MG TABLET    Take 500 mg by mouth at bedtime. To be given with Restoril   AMLODIPINE (NORVASC) 5 MG TABLET    Take 1 tablet (5 mg total) by mouth every morning.   CARBOXYMETHYLCELLULOSE (REFRESH TEARS) 0.5 % SOLN    Place 1 drop into both eyes 3 (three) times daily.   CHOLECALCIFEROL (VITAMIN D) 1000 UNITS TABLET    Take 1,000 Units by mouth every morning.    CYCLOSPORINE (RESTASIS) 0.05 % OPHTHALMIC EMULSION    Place 1 drop into both eyes 2 (two) times daily.    FEEDING SUPPLEMENT (ENSURE CLINICAL STRENGTH) LIQD    Take 237 mLs by mouth 2 (two) times daily with a meal.   FERROUS SULFATE 325 (65 FE) MG TABLET    Take 325 mg by mouth daily with breakfast.   FINASTERIDE (PROSCAR) 5 MG TABLET    Take 5 mg by mouth daily.   HYPROMELLOSE (GENTEAL OP)    Apply 1 drop to eye at bedtime.   MELATONIN 3 MG TABS    Take 3 mg by mouth at bedtime.   MEMANTINE HCL ER (NAMENDA XR) 28 MG CP24    Take 10 mg by mouth 2 (two) times daily.    OMEPRAZOLE (PRILOSEC) 20 MG CAPSULE    Take 20 mg by mouth daily.   OXYCODONE (OXY-IR) 5 MG CAPSULE    Take 5 mg by mouth every 4 (four) hours as needed. pain   POTASSIUM CHLORIDE SA (K-DUR,KLOR-CON) 20 MEQ TABLET    Take 20 mEq by mouth daily.   RANITIDINE (ZANTAC) 150 MG TABLET    Take 150 mg by mouth at bedtime.   SENNA (SENOKOT) 8.6 MG TABLET  Take 2 tablets by mouth 2 (two) times daily.    TAMSULOSIN HCL (FLOMAX) 0.4 MG CAPS    Take 0.4  mg by mouth every morning.    VENLAFAXINE (EFFEXOR) 75 MG TABLET    Take 75 mg by mouth daily.  Modified Medications   Modified Medication Previous Medication   MAGNESIUM HYDROXIDE (MILK OF MAGNESIA) 400 MG/5ML SUSPENSION magnesium hydroxide (MILK OF MAGNESIA) 400 MG/5ML suspension      Take 30 mLs by mouth every 3 (three) days. Please take 30 ml by mouth daily prn for constipation.    Take 30 mLs by mouth daily as needed. Please take 30 ml by mouth for the next three days (util 8/31) then you can take as needed for constipation.  Discontinued Medications   DEMECLOCYCLINE (DECLOMYCIN) 150 MG TABLET    Take 150 mg by mouth daily.      Physical Exam  Constitutional: He appears well-developed and well-nourished.  HENT:  Head: Normocephalic and atraumatic. Not macrocephalic.  Nose: Mucosal edema and rhinorrhea present. Right sinus exhibits no maxillary sinus tenderness and no frontal sinus tenderness. Left sinus exhibits no maxillary sinus tenderness and no frontal sinus tenderness.  Mouth/Throat: Uvula is midline. Posterior oropharyngeal erythema present. No oropharyngeal exudate or posterior oropharyngeal edema.  Eyes: EOM are normal. Pupils are equal, round, and reactive to light.  Neck: Normal range of motion. Neck supple. No JVD present. No thyromegaly present.  Cardiovascular: Normal rate and regular rhythm.   Murmur heard.  Systolic murmur is present with a grade of 2/6  Pulmonary/Chest: Effort normal and breath sounds normal. He has no wheezes. He has no rales.  Abdominal: Soft. Bowel sounds are normal. There is no tenderness.  Musculoskeletal: Normal range of motion. He exhibits no edema.  Scoliokyphosis, walking with walker with SBA  Lymphadenopathy:    He has no cervical adenopathy.  Neurological: He is alert. He displays normal reflexes. No cranial nerve deficit. He exhibits normal muscle tone. Coordination normal.  Skin: Skin is warm and dry. No rash noted. No erythema.    Psychiatric: His mood appears not anxious. His affect is not angry, not blunt and not labile. His speech is not delayed and not slurred. He is not agitated, not aggressive, not slowed, not withdrawn, not actively hallucinating and not combative. Thought content is not paranoid and not delusional. Cognition and memory are impaired. He expresses impulsivity. He does not exhibit a depressed mood. He exhibits abnormal recent memory. He is attentive.    Filed Vitals:   03/09/13 1622  BP: 132/86  Pulse: 81  Temp: 98 F (36.7 C)  TempSrc: Tympanic  Resp: 14      Labs reviewed: Basic Metabolic Panel:  Recent Labs  10/05/12 01/29/13 02/12/13  NA 135* 136* 137  K 3.8 4.4 4.3  BUN 34* 25* 20  CREATININE 2.0* 1.9* 1.7*   Liver Function Tests:  Recent Labs  01/29/13  AST 13*  ALT 9*  ALKPHOS 108   CBC:  Recent Labs  08/03/12 10/05/12 11/30/12  WBC 6.1 6.4 7.6  HGB 11.1* 11.3* 10.5*  HCT 33* 33* 31*  PLT 227 254 245       Assessment/Plan Hyponatremia Resolved, Na 137 02/12/13 2 weeks after Demeclocycline was decreased to 150mg  daily. Will Dc Demeclocycline. F/u BMP in 2 weeks.                   Depression Improved on Effexor 75mg   Dementia with behavioral disturbance Stabilized in SNF and [facility]                      Unspecified constipation Managed with Senokot S II bid and MOM q 3days and daily prn.                 BPH (benign prostatic hyperplasia) Recurrent urinary retention-Foley inserted 11/04/12 at ED-successfully removed--continued Tamsulosin and Finasteride                    Anemia Stable, Hgb 10-11, takes Fe 325mg .                   GERD (gastroesophageal reflux disease) Stable on Omeprazole 20mg   and Zantac 75mg -able to discontinue Mylanta prn due to not used.                     Hypertension Controlled on  Amlodipine 5mg                      Back pain Tylenol nightly and prn is adequate.       Family/ Staff Communication: observe the patient.   Goals of Care: SNF  Labs/tests ordered: BMP in 2 weeks.

## 2013-03-09 NOTE — Assessment & Plan Note (Addendum)
Stable on Omeprazole 20mg  and Zantac 75mg-able to discontinue Mylanta prn due to not used.                      

## 2013-03-09 NOTE — Assessment & Plan Note (Signed)
Resolved, Na 137 02/12/13 2 weeks after Demeclocycline was decreased to 150mg  daily. Will Dc Demeclocycline. F/u BMP in 2 weeks.

## 2013-03-09 NOTE — Assessment & Plan Note (Addendum)
Managed with Senokot S II bid and MOM q 3days and daily prn.

## 2013-03-21 DIAGNOSIS — R350 Frequency of micturition: Secondary | ICD-10-CM | POA: Diagnosis not present

## 2013-03-21 DIAGNOSIS — R3915 Urgency of urination: Secondary | ICD-10-CM | POA: Diagnosis not present

## 2013-03-22 DIAGNOSIS — I10 Essential (primary) hypertension: Secondary | ICD-10-CM | POA: Diagnosis not present

## 2013-03-22 LAB — BASIC METABOLIC PANEL
BUN: 24 mg/dL — AB (ref 4–21)
CREATININE: 1.8 mg/dL — AB (ref 0.6–1.3)
Glucose: 82 mg/dL
Potassium: 4.3 mmol/L (ref 3.4–5.3)
Sodium: 137 mmol/L (ref 137–147)

## 2013-04-03 ENCOUNTER — Non-Acute Institutional Stay (SKILLED_NURSING_FACILITY): Payer: Medicare Other | Admitting: Nurse Practitioner

## 2013-04-03 DIAGNOSIS — K5900 Constipation, unspecified: Secondary | ICD-10-CM

## 2013-04-03 DIAGNOSIS — D649 Anemia, unspecified: Secondary | ICD-10-CM

## 2013-04-03 DIAGNOSIS — F32A Depression, unspecified: Secondary | ICD-10-CM

## 2013-04-03 DIAGNOSIS — F3289 Other specified depressive episodes: Secondary | ICD-10-CM

## 2013-04-03 DIAGNOSIS — K219 Gastro-esophageal reflux disease without esophagitis: Secondary | ICD-10-CM

## 2013-04-03 DIAGNOSIS — R1032 Left lower quadrant pain: Secondary | ICD-10-CM

## 2013-04-03 DIAGNOSIS — F329 Major depressive disorder, single episode, unspecified: Secondary | ICD-10-CM

## 2013-04-03 DIAGNOSIS — E876 Hypokalemia: Secondary | ICD-10-CM

## 2013-04-03 DIAGNOSIS — F0391 Unspecified dementia with behavioral disturbance: Secondary | ICD-10-CM

## 2013-04-03 DIAGNOSIS — E871 Hypo-osmolality and hyponatremia: Secondary | ICD-10-CM

## 2013-04-03 DIAGNOSIS — F03918 Unspecified dementia, unspecified severity, with other behavioral disturbance: Secondary | ICD-10-CM

## 2013-04-03 DIAGNOSIS — N400 Enlarged prostate without lower urinary tract symptoms: Secondary | ICD-10-CM | POA: Diagnosis not present

## 2013-04-03 DIAGNOSIS — I10 Essential (primary) hypertension: Secondary | ICD-10-CM

## 2013-04-03 DIAGNOSIS — M549 Dorsalgia, unspecified: Secondary | ICD-10-CM

## 2013-04-04 ENCOUNTER — Encounter: Payer: Self-pay | Admitting: Nurse Practitioner

## 2013-04-04 DIAGNOSIS — N390 Urinary tract infection, site not specified: Secondary | ICD-10-CM | POA: Diagnosis not present

## 2013-04-04 DIAGNOSIS — R1032 Left lower quadrant pain: Secondary | ICD-10-CM | POA: Insufficient documentation

## 2013-04-04 DIAGNOSIS — D649 Anemia, unspecified: Secondary | ICD-10-CM | POA: Diagnosis not present

## 2013-04-04 NOTE — Assessment & Plan Note (Signed)
Managed with Senokot S II bid and MOM q 3days-stable.    

## 2013-04-04 NOTE — Assessment & Plan Note (Signed)
Improved on Effexor 75mg   

## 2013-04-04 NOTE — Assessment & Plan Note (Signed)
Supplemented with Kcl 16meq--serum K 3.8 10/05/12 and 4.4 01/29/13 and 4.3 03/22/13

## 2013-04-04 NOTE — Assessment & Plan Note (Addendum)
On and off and dull pain in nature, denied nausea, vomiting, constipation, or diarrhea. Afebrile. Unknown duration. Severity-livable. Uncertain of aggravating or relieving factors. Staff reported the problem today. BS active x4, no guarding, tenderness, or rebound tenderness. Will obtain UA C/S and CBC. May consider CT abd if no better in setting of hx nephrolithiasis and diverticulosis.

## 2013-04-04 NOTE — Assessment & Plan Note (Signed)
Stabilized in SNF and Namenda                     

## 2013-04-04 NOTE — Assessment & Plan Note (Signed)
Stable on Omeprazole 20mg   and Zantac 75mg -able to discontinue Mylanta prn due to not used.

## 2013-04-04 NOTE — Assessment & Plan Note (Signed)
Stable, Hgb 10-11, takes Fe 325mg . Update CBC-may consider dc Fe.

## 2013-04-04 NOTE — Progress Notes (Signed)
Patient ID: Vincent Potts, male   DOB: 05-Feb-1925, 80 y.o.   MRN: [id number]   Code Status: DNR  Allergies  Allergen Reactions  . Olanzapine Other (See Comments)    unknown  . Rivastigmine Nausea Only    Chief Complaint  Patient presents with  . Medical Managment of Chronic Issues    on and off left lower quadrant pain.   . Acute Visit    HPI: Patient is a 80 y.o. male seen in the SNF at [facility] today for evaluation of c/o on and off LLQ pain and other chronic medical conditions.  Problem List Items Addressed This Visit   Abdominal pain, left lower quadrant - Primary     On and off and dull pain in nature, denied nausea, vomiting, constipation, or diarrhea. Afebrile. Unknown duration. Severity-livable. Uncertain of aggravating or relieving factors. Staff reported the problem today. BS active x4, no guarding, tenderness, or rebound tenderness. Will obtain UA C/S and CBC. May consider CT abd if no better in setting of hx nephrolithiasis and diverticulosis.      Anemia     Stable, Hgb 10-11, takes Fe 325mg . Update CBC-may consider dc Fe.                       Back pain     Tylenol nightly and prn is adequate. Also Oxycodone 5mg  q4h prn available to him.         BPH (benign prostatic hyperplasia)     Recurrent urinary retention-Foley inserted 11/04/12 at ED-successfully removed--continued Tamsulosin and Finasteride                        Dementia with behavioral disturbance     Stabilized in SNF and [facility]                          Depression     Improved on Effexor 75mg                         GERD (gastroesophageal reflux disease)     Stable on Omeprazole 20mg   and Zantac 75mg -able to discontinue Mylanta prn due to not used.                         Hypertension     Controlled on Amlodipine 5mg                          Hypokalemia     Supplemented  with Kcl 36meq--serum K 3.8 10/05/12 and 4.4 01/29/13 and 4.3 03/22/13              Hyponatremia     off Demeclocycline. F/u BMP in 2 weeks Na 137 03/22/13                      Unspecified constipation     Managed with Senokot S II bid and MOM q 3days-stable.                        Review of Systems  Constitutional: Negative for fever, chills, weight loss, malaise/fatigue and diaphoresis.  HENT: Positive for hearing loss. Negative for congestion, ear pain and sore throat.   Eyes: Negative for blurred vision, double vision, photophobia, pain, discharge and redness.  Respiratory: Negative for  cough, sputum production, shortness of breath and wheezing.   Cardiovascular: Negative for chest pain, palpitations, orthopnea, claudication, leg swelling and PND.  Gastrointestinal: Positive for abdominal pain. Negative for heartburn, nausea, vomiting, diarrhea and constipation.       C/o on and off LLQ pain-stated no pain today.   Genitourinary: Negative for dysuria, urgency and flank pain. Frequency: much better.  Musculoskeletal: Positive for back pain and joint pain. Negative for falls and neck pain.  Skin: Negative for itching and rash.  Neurological: Negative for dizziness, tremors, speech change, focal weakness, seizures, loss of consciousness and headaches.  Endo/Heme/Allergies: Negative for environmental allergies and polydipsia. Does not bruise/bleed easily.  Psychiatric/Behavioral: Positive for memory loss. Negative for depression and hallucinations. The patient is not nervous/anxious and does not have insomnia.      Past Medical History  Diagnosis Date  . Thyroid disease   . Anemia   . Vitamin D deficiency disease   . Hyponatremia   . Anxiety   . Parkinsonism   . Hearing loss   . Hypertension   . PVC (premature ventricular contraction)   . CVA (cerebral infarction)   . GERD (gastroesophageal reflux disease)   . Diverticulosis   .  Sciatica   . Back pain   . Gait disorder   . Cataract   . Edema   . Keratosis   . Sciatica   . Cystic disease of liver   . Paresthesia   . Inguinal hernia   . Hearing loss   . Stroke 2010    no deficits  . Chronic kidney disease 2013    rhabdo  . H/O hiatal hernia 2008    repaired  . Depression 06/06/2012  . Dementia with behavioral disturbance 06/06/2012   Past Surgical History  Procedure Laterality Date  . Prostate surgery      cancer  . Hernia repair    . Appendectomy    . Esophagogastroduodenoscopy  10/25/2011    Procedure: ESOPHAGOGASTRODUODENOSCOPY (EGD);  Surgeon: Winfield Cunas., MD;  Location: [facility] ENDOSCOPY;  Service: Endoscopy;  Laterality: N/A;   Social History:   reports that he has never smoked. He does not have any smokeless tobacco history on file. He reports that he drinks alcohol. He reports that he does not use illicit drugs.  Family History  Problem Relation Age of Onset  . Hypertension Mother     Medications: Patient's Medications  New Prescriptions   No medications on file  Previous Medications   ACETAMINOPHEN (TYLENOL) 325 MG TABLET    Take 650 mg by mouth every 4 (four) hours as needed.   ACETAMINOPHEN (TYLENOL) 500 MG TABLET    Take 500 mg by mouth at bedtime. To be given with Restoril   AMLODIPINE (NORVASC) 5 MG TABLET    Take 1 tablet (5 mg total) by mouth every morning.   CARBOXYMETHYLCELLULOSE (REFRESH TEARS) 0.5 % SOLN    Place 1 drop into both eyes 3 (three) times daily.   CHOLECALCIFEROL (VITAMIN D) 1000 UNITS TABLET    Take 1,000 Units by mouth every morning.    CYCLOSPORINE (RESTASIS) 0.05 % OPHTHALMIC EMULSION    Place 1 drop into both eyes 2 (two) times daily.    FEEDING SUPPLEMENT (ENSURE CLINICAL STRENGTH) LIQD    Take 237 mLs by mouth 2 (two) times daily with a meal.   FERROUS SULFATE 325 (65 FE) MG TABLET    Take 325 mg by mouth daily with breakfast.   FINASTERIDE (PROSCAR) 5 MG  TABLET    Take 5 mg by mouth daily.   HYPROMELLOSE  (GENTEAL OP)    Apply 1 drop to eye at bedtime.   MAGNESIUM HYDROXIDE (MILK OF MAGNESIA) 400 MG/5ML SUSPENSION    Take 30 mLs by mouth every 3 (three) days. Please take 30 ml by mouth daily prn for constipation.   MELATONIN 3 MG TABS    Take 3 mg by mouth at bedtime.   MEMANTINE HCL ER (NAMENDA XR) 28 MG CP24    Take 10 mg by mouth 2 (two) times daily.    OMEPRAZOLE (PRILOSEC) 20 MG CAPSULE    Take 20 mg by mouth daily.   OXYCODONE (OXY-IR) 5 MG CAPSULE    Take 5 mg by mouth every 4 (four) hours as needed. pain   POTASSIUM CHLORIDE SA (K-DUR,KLOR-CON) 20 MEQ TABLET    Take 20 mEq by mouth daily.   RANITIDINE (ZANTAC) 150 MG TABLET    Take 150 mg by mouth at bedtime.   SENNA (SENOKOT) 8.6 MG TABLET    Take 2 tablets by mouth 2 (two) times daily.    TAMSULOSIN HCL (FLOMAX) 0.4 MG CAPS    Take 0.4 mg by mouth every morning.    VENLAFAXINE (EFFEXOR) 75 MG TABLET    Take 75 mg by mouth daily.  Modified Medications   No medications on file  Discontinued Medications   No medications on file     Physical Exam  Constitutional: He appears well-developed and well-nourished.  HENT:  Head: Normocephalic and atraumatic. Not macrocephalic.  Nose: Mucosal edema and rhinorrhea present. Right sinus exhibits no maxillary sinus tenderness and no frontal sinus tenderness. Left sinus exhibits no maxillary sinus tenderness and no frontal sinus tenderness.  Mouth/Throat: Uvula is midline. Posterior oropharyngeal erythema present. No oropharyngeal exudate or posterior oropharyngeal edema.  Eyes: EOM are normal. Pupils are equal, round, and reactive to light.  Neck: Normal range of motion. Neck supple. No JVD present. No thyromegaly present.  Cardiovascular: Normal rate and regular rhythm.   Murmur heard.  Systolic murmur is present with a grade of 2/6  Pulmonary/Chest: Effort normal and breath sounds normal. He has no wheezes. He has no rales.  Abdominal: Soft. Bowel sounds are normal. There is no tenderness.    Musculoskeletal: Normal range of motion. He exhibits no edema.  Scoliokyphosis, walking with walker with SBA  Lymphadenopathy:    He has no cervical adenopathy.  Neurological: He is alert. He displays normal reflexes. No cranial nerve deficit. He exhibits normal muscle tone. Coordination normal.  Skin: Skin is warm and dry. No rash noted. No erythema.  Psychiatric: His mood appears not anxious. His affect is not angry, not blunt and not labile. His speech is not delayed and not slurred. He is not agitated, not aggressive, not slowed, not withdrawn, not actively hallucinating and not combative. Thought content is not paranoid and not delusional. Cognition and memory are impaired. He expresses impulsivity. He does not exhibit a depressed mood. He exhibits abnormal recent memory. He is attentive.    Filed Vitals:   04/03/13 1418  BP: 126/86  Pulse: 76  Temp: 97.8 F (36.6 C)  TempSrc: Tympanic  Resp: 20      Labs reviewed: Basic Metabolic Panel:  Recent Labs  01/29/13 02/12/13 03/22/13  NA 136* 137 137  K 4.4 4.3 4.3  BUN 25* 20 24*  CREATININE 1.9* 1.7* 1.8*   Liver Function Tests:  Recent Labs  01/29/13  AST 13*  ALT 9*  ALKPHOS 108   CBC:  Recent Labs  08/03/12 10/05/12 11/30/12  WBC 6.1 6.4 7.6  HGB 11.1* 11.3* 10.5*  HCT 33* 33* 31*  PLT 227 254 245       Assessment/Plan Abdominal pain, left lower quadrant On and off and dull pain in nature, denied nausea, vomiting, constipation, or diarrhea. Afebrile. Unknown duration. Severity-livable. Uncertain of aggravating or relieving factors. Staff reported the problem today. BS active x4, no guarding, tenderness, or rebound tenderness. Will obtain UA C/S and CBC. May consider CT abd if no better in setting of hx nephrolithiasis and diverticulosis.    Unspecified constipation Managed with Senokot S II bid and MOM q 3days-stable.                   GERD (gastroesophageal reflux disease) Stable on  Omeprazole 20mg   and Zantac 75mg -able to discontinue Mylanta prn due to not used.                       BPH (benign prostatic hyperplasia) Recurrent urinary retention-Foley inserted 11/04/12 at ED-successfully removed--continued Tamsulosin and Finasteride                      Hypertension Controlled on Amlodipine 5mg                        Anemia Stable, Hgb 10-11, takes Fe 325mg . Update CBC-may consider dc Fe.                     Hypokalemia Supplemented with Kcl 64meq--serum K 3.8 10/05/12 and 4.4 01/29/13 and 4.3 03/22/13            Depression Improved on Effexor 75mg                       Dementia with behavioral disturbance Stabilized in SNF and [facility]                        Hyponatremia off Demeclocycline. F/u BMP in 2 weeks Na 137 03/22/13                    Back pain Tylenol nightly and prn is adequate. Also Oxycodone 5mg  q4h prn available to him.         Family/ Staff Communication: observe the patient.   Goals of Care: SNF  Labs/tests ordered: CBC, UA C/S

## 2013-04-04 NOTE — Assessment & Plan Note (Signed)
Recurrent urinary retention-Foley inserted 11/04/12 at ED-successfully removed--continued Tamsulosin and Finasteride    

## 2013-04-04 NOTE — Assessment & Plan Note (Signed)
Controlled on Amlodipine 5mg   

## 2013-04-04 NOTE — Assessment & Plan Note (Signed)
off Demeclocycline. F/u BMP in 2 weeks Na 137 03/22/13

## 2013-04-04 NOTE — Assessment & Plan Note (Signed)
Tylenol nightly and prn is adequate. Also Oxycodone 5mg q4h prn available to him.    

## 2013-04-09 DIAGNOSIS — I10 Essential (primary) hypertension: Secondary | ICD-10-CM | POA: Diagnosis not present

## 2013-04-09 DIAGNOSIS — D649 Anemia, unspecified: Secondary | ICD-10-CM | POA: Diagnosis not present

## 2013-04-09 LAB — CBC AND DIFFERENTIAL
HEMATOCRIT: 31 % — AB (ref 41–53)
Hemoglobin: 10.4 g/dL — AB (ref 13.5–17.5)
Platelets: 274 10*3/uL (ref 150–399)
WBC: 8.2 10*3/mL

## 2013-04-09 LAB — BASIC METABOLIC PANEL
BUN: 24 mg/dL — AB (ref 4–21)
CREATININE: 1.8 mg/dL — AB (ref 0.6–1.3)
Potassium: 3.9 mmol/L (ref 3.4–5.3)
Sodium: 136 mmol/L — AB (ref 137–147)

## 2013-04-09 LAB — HEPATIC FUNCTION PANEL
ALK PHOS: 77 U/L (ref 25–125)
ALT: 10 U/L (ref 10–40)
AST: 10 U/L — AB (ref 14–40)
Bilirubin, Total: 0.3 mg/dL

## 2013-05-04 ENCOUNTER — Encounter: Payer: Self-pay | Admitting: Nurse Practitioner

## 2013-05-04 ENCOUNTER — Non-Acute Institutional Stay (SKILLED_NURSING_FACILITY): Payer: Medicare Other | Admitting: Nurse Practitioner

## 2013-05-04 DIAGNOSIS — R945 Abnormal results of liver function studies: Secondary | ICD-10-CM

## 2013-05-04 DIAGNOSIS — N400 Enlarged prostate without lower urinary tract symptoms: Secondary | ICD-10-CM

## 2013-05-04 DIAGNOSIS — E876 Hypokalemia: Secondary | ICD-10-CM

## 2013-05-04 DIAGNOSIS — R7989 Other specified abnormal findings of blood chemistry: Secondary | ICD-10-CM | POA: Diagnosis not present

## 2013-05-04 DIAGNOSIS — F32A Depression, unspecified: Secondary | ICD-10-CM

## 2013-05-04 DIAGNOSIS — D649 Anemia, unspecified: Secondary | ICD-10-CM | POA: Diagnosis not present

## 2013-05-04 DIAGNOSIS — N189 Chronic kidney disease, unspecified: Secondary | ICD-10-CM

## 2013-05-04 DIAGNOSIS — I10 Essential (primary) hypertension: Secondary | ICD-10-CM | POA: Diagnosis not present

## 2013-05-04 DIAGNOSIS — M549 Dorsalgia, unspecified: Secondary | ICD-10-CM

## 2013-05-04 DIAGNOSIS — K219 Gastro-esophageal reflux disease without esophagitis: Secondary | ICD-10-CM

## 2013-05-04 DIAGNOSIS — F3289 Other specified depressive episodes: Secondary | ICD-10-CM

## 2013-05-04 DIAGNOSIS — E871 Hypo-osmolality and hyponatremia: Secondary | ICD-10-CM

## 2013-05-04 DIAGNOSIS — K5900 Constipation, unspecified: Secondary | ICD-10-CM

## 2013-05-04 DIAGNOSIS — R339 Retention of urine, unspecified: Secondary | ICD-10-CM

## 2013-05-04 DIAGNOSIS — F03918 Unspecified dementia, unspecified severity, with other behavioral disturbance: Secondary | ICD-10-CM

## 2013-05-04 DIAGNOSIS — F329 Major depressive disorder, single episode, unspecified: Secondary | ICD-10-CM

## 2013-05-04 DIAGNOSIS — F0391 Unspecified dementia with behavioral disturbance: Secondary | ICD-10-CM

## 2013-05-04 NOTE — Progress Notes (Signed)
Patient ID: Vincent Potts, male   DOB: August 26, 1924, 80 y.o.   MRN: [id number]   Code Status: DNR  Allergies  Allergen Reactions  . Olanzapine Other (See Comments)    unknown  . Rivastigmine Nausea Only    Chief Complaint  Patient presents with  . Medical Managment of Chronic Issues    HPI: Patient is a 80 y.o. male seen in the SNF at [facility] today for evaluation of chronic medical conditions.  Problem List Items Addressed This Visit   Urinary retention     No problem since off Myrbetriq    Unspecified constipation     Managed with Senokot S II bid and MOM q 3days-stable.      Hyponatremia     Na 136 04/09/13    Hypokalemia     Resolved, K 3.9 04/09/13, continue Kcl    Hypertension     Controlled on Amlodipine 5mg        GERD (gastroesophageal reflux disease)     Stable on Omeprazole 20mg   and Zantac 75mg , off Mylanta       Depression     Improved on Effexor 75mg       Dementia with behavioral disturbance     Stabilized in SNF and continue Namenda    Chronic kidney disease     Creatinine 1.88 01/29/13, 1.96 10/05/12, 1.78 04/09/13     BPH (benign prostatic hyperplasia)     Recurrent urinary retention-Foley inserted 11/04/12 at ED-successfully removed--continued Tamsulosin and Finasteride      Back pain     Tylenol nightly and prn is adequate. Also Oxycodone 5mg  q4h prn available to him.      Anemia     Stable, Hgb 10.4 04/09/13, continue takes Fe 325mg     Abnormal LFTs - Primary     Normalized AST/ALT/AK 04/09/13       Review of Systems  Constitutional: Negative for fever, chills, weight loss, malaise/fatigue and diaphoresis.  HENT: Positive for hearing loss. Negative for congestion, ear pain and sore throat.   Eyes: Negative for blurred vision, double vision, photophobia, pain, discharge and redness.  Respiratory: Negative for cough, sputum production, shortness of breath and wheezing.   Cardiovascular: Negative for chest pain, palpitations,  orthopnea, claudication, leg swelling and PND.  Gastrointestinal: Negative for heartburn, nausea, vomiting, abdominal pain, diarrhea and constipation.       C/o on and off LLQ pain-resolved.   Genitourinary: Negative for dysuria, urgency and flank pain. Frequency: much better.  Musculoskeletal: Positive for back pain and joint pain. Negative for falls and neck pain.  Skin: Negative for itching and rash.  Neurological: Negative for dizziness, tremors, speech change, focal weakness, seizures, loss of consciousness and headaches.  Endo/Heme/Allergies: Negative for environmental allergies and polydipsia. Does not bruise/bleed easily.  Psychiatric/Behavioral: Positive for memory loss. Negative for depression and hallucinations. The patient is not nervous/anxious and does not have insomnia.      Past Medical History  Diagnosis Date  . Thyroid disease   . Anemia   . Vitamin D deficiency disease   . Hyponatremia   . Anxiety   . Parkinsonism   . Hearing loss   . Hypertension   . PVC (premature ventricular contraction)   . CVA (cerebral infarction)   . GERD (gastroesophageal reflux disease)   . Diverticulosis   . Sciatica   . Back pain   . Gait disorder   . Cataract   . Edema   . Keratosis   . Sciatica   .  Cystic disease of liver   . Paresthesia   . Inguinal hernia   . Hearing loss   . Stroke 2010    no deficits  . Chronic kidney disease 2013    rhabdo  . H/O hiatal hernia 2008    repaired  . Depression 06/06/2012  . Dementia with behavioral disturbance 06/06/2012   Past Surgical History  Procedure Laterality Date  . Prostate surgery      cancer  . Hernia repair    . Appendectomy    . Esophagogastroduodenoscopy  10/25/2011    Procedure: ESOPHAGOGASTRODUODENOSCOPY (EGD);  Surgeon: Winfield Cunas., MD;  Location: [facility] ENDOSCOPY;  Service: Endoscopy;  Laterality: N/A;   Social History:   reports that he has never smoked. He does not have any smokeless tobacco history on file.  He reports that he drinks alcohol. He reports that he does not use illicit drugs.  Family History  Problem Relation Age of Onset  . Hypertension Mother     Medications: Patient's Medications  New Prescriptions   No medications on file  Previous Medications   ACETAMINOPHEN (TYLENOL) 325 MG TABLET    Take 650 mg by mouth every 4 (four) hours as needed.   ACETAMINOPHEN (TYLENOL) 500 MG TABLET    Take 500 mg by mouth at bedtime. To be given with Restoril   AMLODIPINE (NORVASC) 5 MG TABLET    Take 1 tablet (5 mg total) by mouth every morning.   CARBOXYMETHYLCELLULOSE (REFRESH TEARS) 0.5 % SOLN    Place 1 drop into both eyes 3 (three) times daily.   CHOLECALCIFEROL (VITAMIN D) 1000 UNITS TABLET    Take 1,000 Units by mouth every morning.    CYCLOSPORINE (RESTASIS) 0.05 % OPHTHALMIC EMULSION    Place 1 drop into both eyes 2 (two) times daily.    FEEDING SUPPLEMENT (ENSURE CLINICAL STRENGTH) LIQD    Take 237 mLs by mouth 2 (two) times daily with a meal.   FERROUS SULFATE 325 (65 FE) MG TABLET    Take 325 mg by mouth daily with breakfast.   FINASTERIDE (PROSCAR) 5 MG TABLET    Take 5 mg by mouth daily.   HYPROMELLOSE (GENTEAL OP)    Apply 1 drop to eye at bedtime.   MAGNESIUM HYDROXIDE (MILK OF MAGNESIA) 400 MG/5ML SUSPENSION    Take 30 mLs by mouth every 3 (three) days. Please take 30 ml by mouth daily prn for constipation.   MELATONIN 3 MG TABS    Take 3 mg by mouth at bedtime.   MEMANTINE HCL ER (NAMENDA XR) 28 MG CP24    Take 10 mg by mouth 2 (two) times daily.    OMEPRAZOLE (PRILOSEC) 20 MG CAPSULE    Take 20 mg by mouth daily.   OXYCODONE (OXY-IR) 5 MG CAPSULE    Take 5 mg by mouth every 4 (four) hours as needed. pain   POTASSIUM CHLORIDE SA (K-DUR,KLOR-CON) 20 MEQ TABLET    Take 20 mEq by mouth daily.   RANITIDINE (ZANTAC) 150 MG TABLET    Take 150 mg by mouth at bedtime.   SENNA (SENOKOT) 8.6 MG TABLET    Take 2 tablets by mouth 2 (two) times daily.    TAMSULOSIN HCL (FLOMAX) 0.4 MG CAPS     Take 0.4 mg by mouth every morning.    VENLAFAXINE (EFFEXOR) 75 MG TABLET    Take 75 mg by mouth daily.  Modified Medications   No medications on file  Discontinued Medications  No medications on file     Physical Exam  Constitutional: He appears well-developed and well-nourished.  HENT:  Head: Normocephalic and atraumatic. Not macrocephalic.  Nose: Mucosal edema and rhinorrhea present. Right sinus exhibits no maxillary sinus tenderness and no frontal sinus tenderness. Left sinus exhibits no maxillary sinus tenderness and no frontal sinus tenderness.  Mouth/Throat: Uvula is midline. Posterior oropharyngeal erythema present. No oropharyngeal exudate or posterior oropharyngeal edema.  Eyes: EOM are normal. Pupils are equal, round, and reactive to light.  Neck: Normal range of motion. Neck supple. No JVD present. No thyromegaly present.  Cardiovascular: Normal rate and regular rhythm.   Murmur heard.  Systolic murmur is present with a grade of 2/6  Pulmonary/Chest: Effort normal and breath sounds normal. He has no wheezes. He has no rales.  Abdominal: Soft. Bowel sounds are normal. There is no tenderness.  Musculoskeletal: Normal range of motion. He exhibits no edema.  Scoliokyphosis, walking with walker with SBA  Lymphadenopathy:    He has no cervical adenopathy.  Neurological: He is alert. He displays normal reflexes. No cranial nerve deficit. He exhibits normal muscle tone. Coordination normal.  Skin: Skin is warm and dry. No rash noted. No erythema.  Psychiatric: His mood appears not anxious. His affect is not angry, not blunt and not labile. His speech is not delayed and not slurred. He is not agitated, not aggressive, not slowed, not withdrawn, not actively hallucinating and not combative. Thought content is not paranoid and not delusional. Cognition and memory are impaired. He expresses impulsivity. He does not exhibit a depressed mood. He exhibits abnormal recent memory. He is  attentive.    Filed Vitals:   05/04/13 1619  BP: 144/93  Pulse: 83  Temp: 96.3 F (35.7 C)  TempSrc: Tympanic  Resp: 19      Labs reviewed: Basic Metabolic Panel:  Recent Labs  02/12/13 03/22/13 04/09/13  NA 137 137 136*  K 4.3 4.3 3.9  BUN 20 24* 24*  CREATININE 1.7* 1.8* 1.8*   Liver Function Tests:  Recent Labs  01/29/13 04/09/13  AST 13* 10*  ALT 9* 10  ALKPHOS 108 77   CBC:  Recent Labs  10/05/12 11/30/12 04/09/13  WBC 6.4 7.6 8.2  HGB 11.3* 10.5* 10.4*  HCT 33* 31* 31*  PLT 254 245 274       Assessment/Plan Abnormal LFTs Normalized AST/ALT/AK 04/09/13  Anemia Stable, Hgb 10.4 04/09/13, continue takes Fe 325mg   Back pain Tylenol nightly and prn is adequate. Also Oxycodone 5mg  q4h prn available to him.    BPH (benign prostatic hyperplasia) Recurrent urinary retention-Foley inserted 11/04/12 at ED-successfully removed--continued Tamsulosin and Finasteride    Chronic kidney disease Creatinine 1.88 01/29/13, 1.96 10/05/12, 1.78 04/09/13   Dementia with behavioral disturbance Stabilized in SNF and continue Namenda  Depression Improved on Effexor 75mg     GERD (gastroesophageal reflux disease) Stable on Omeprazole 20mg   and Zantac 75mg , off Mylanta     Hypertension Controlled on Amlodipine 5mg      Hypokalemia Resolved, K 3.9 04/09/13, continue Kcl  Hyponatremia Na 136 04/09/13  Unspecified constipation Managed with Senokot S II bid and MOM q 3days-stable.    Urinary retention No problem since off Myrbetriq    Family/ Staff Communication: observe the patient.   Goals of Care: SNF  Labs/tests ordered: none

## 2013-05-04 NOTE — Assessment & Plan Note (Signed)
Stabilized in SNF and continue Namenda

## 2013-05-04 NOTE — Assessment & Plan Note (Signed)
Stable on Omeprazole 20mg   and Zantac 75mg , off Mylanta

## 2013-05-04 NOTE — Assessment & Plan Note (Addendum)
Stable, Hgb 10.4 04/09/13, continue takes Fe 325mg 

## 2013-05-04 NOTE — Assessment & Plan Note (Signed)
Managed with Senokot S II bid and MOM q 3days-stable.    

## 2013-05-04 NOTE — Assessment & Plan Note (Signed)
Recurrent urinary retention-Foley inserted 11/04/12 at ED-successfully removed--continued Tamsulosin and Finasteride    

## 2013-05-04 NOTE — Assessment & Plan Note (Signed)
Normalized AST/ALT/AK 04/09/13

## 2013-05-04 NOTE — Assessment & Plan Note (Addendum)
Resolved, K 3.9 04/09/13, continue Kcl

## 2013-05-04 NOTE — Assessment & Plan Note (Signed)
Creatinine 1.88 01/29/13, 1.96 10/05/12, 1.78 04/09/13

## 2013-05-04 NOTE — Assessment & Plan Note (Signed)
Improved on Effexor 75mg 

## 2013-05-04 NOTE — Assessment & Plan Note (Signed)
No problem since off Myrbetriq

## 2013-05-04 NOTE — Assessment & Plan Note (Signed)
Tylenol nightly and prn is adequate. Also Oxycodone 5mg q4h prn available to him.    

## 2013-05-04 NOTE — Assessment & Plan Note (Signed)
Controlled on Amlodipine 5mg   

## 2013-05-04 NOTE — Assessment & Plan Note (Signed)
Na 136 04/09/13

## 2013-06-08 ENCOUNTER — Encounter: Payer: Self-pay | Admitting: Nurse Practitioner

## 2013-06-08 ENCOUNTER — Non-Acute Institutional Stay (SKILLED_NURSING_FACILITY): Payer: Medicare Other | Admitting: Nurse Practitioner

## 2013-06-08 DIAGNOSIS — F3289 Other specified depressive episodes: Secondary | ICD-10-CM

## 2013-06-08 DIAGNOSIS — D649 Anemia, unspecified: Secondary | ICD-10-CM

## 2013-06-08 DIAGNOSIS — K219 Gastro-esophageal reflux disease without esophagitis: Secondary | ICD-10-CM

## 2013-06-08 DIAGNOSIS — I10 Essential (primary) hypertension: Secondary | ICD-10-CM

## 2013-06-08 DIAGNOSIS — E876 Hypokalemia: Secondary | ICD-10-CM

## 2013-06-08 DIAGNOSIS — F0391 Unspecified dementia with behavioral disturbance: Secondary | ICD-10-CM

## 2013-06-08 DIAGNOSIS — F329 Major depressive disorder, single episode, unspecified: Secondary | ICD-10-CM

## 2013-06-08 DIAGNOSIS — F32A Depression, unspecified: Secondary | ICD-10-CM

## 2013-06-08 DIAGNOSIS — N400 Enlarged prostate without lower urinary tract symptoms: Secondary | ICD-10-CM

## 2013-06-08 DIAGNOSIS — G4700 Insomnia, unspecified: Secondary | ICD-10-CM

## 2013-06-08 DIAGNOSIS — F03918 Unspecified dementia, unspecified severity, with other behavioral disturbance: Secondary | ICD-10-CM

## 2013-06-08 NOTE — Assessment & Plan Note (Signed)
Stable on Omeprazole 20mg  and Zantac 75mg, off Mylanta    

## 2013-06-08 NOTE — Assessment & Plan Note (Signed)
Gradual decline  in SNF and continue Namenda

## 2013-06-08 NOTE — Assessment & Plan Note (Addendum)
Continue routine melatonin and adding prn Melatonin. May have to increase Venlafaxine or hypnotics. Update CBC, BMP, TSH

## 2013-06-08 NOTE — Progress Notes (Signed)
Patient ID: Vincent Potts, male   DOB: Dec 12, 1924, 80 y.o.   MRN: [id number]   Code Status: DNR  Allergies  Allergen Reactions  . Olanzapine Other (See Comments)    unknown  . Rivastigmine Nausea Only    Chief Complaint  Patient presents with  . Medical Managment of Chronic Issues  . Acute Visit    insomnia    HPI: Patient is a 80 y.o. male seen in the SNF at [facility] today for evaluation of chronic medical conditions.  Problem List Items Addressed This Visit   Anemia - Primary     Stable, Hgb 10.4 04/09/13, continue takes Fe 325mg . Update CBC     BPH (benign prostatic hyperplasia)     Recurrent urinary retention-Foley inserted 11/04/12 at ED-successfully removed--continued Tamsulosin and Finasteride     Dementia with behavioral disturbance     Gradual decline  in SNF and continue Namenda     Depression     Mood is ok, but c/o difficulty falling asleep at night sometimes, on Effexor 75mg . Adding prn Melatonin and update CBC+BMP       GERD (gastroesophageal reflux disease)     Stable on Omeprazole 20mg   and Zantac 75mg , off Mylanta     Hypertension     Controlled on Amlodipine 5mg        Hypokalemia     esolved, K 3.9 04/09/13, continue Kcl. Update BMP     Insomnia     Continue routine melatonin and adding prn Melatonin. May have to increase Venlafaxine or hypnotics. Update CBC, BMP, TSH       Review of Systems  Constitutional: Negative for fever, chills, weight loss, malaise/fatigue and diaphoresis.  HENT: Positive for hearing loss. Negative for congestion, ear pain and sore throat.   Eyes: Negative for blurred vision, double vision, photophobia, pain, discharge and redness.  Respiratory: Negative for cough, sputum production, shortness of breath and wheezing.   Cardiovascular: Negative for chest pain, palpitations, orthopnea, claudication, leg swelling and PND.  Gastrointestinal: Negative for heartburn, nausea, vomiting, abdominal pain, diarrhea and  constipation.       C/o on and off LLQ pain-resolved.   Genitourinary: Negative for dysuria, urgency and flank pain. Frequency: much better.  Musculoskeletal: Positive for back pain and joint pain. Negative for falls and neck pain.  Skin: Negative for itching and rash.  Neurological: Negative for dizziness, tremors, speech change, focal weakness, seizures, loss of consciousness and headaches.  Endo/Heme/Allergies: Negative for environmental allergies and polydipsia. Does not bruise/bleed easily.  Psychiatric/Behavioral: Positive for memory loss. Negative for depression and hallucinations. The patient has insomnia. The patient is not nervous/anxious.      Past Medical History  Diagnosis Date  . Thyroid disease   . Anemia   . Vitamin D deficiency disease   . Hyponatremia   . Anxiety   . Parkinsonism   . Hearing loss   . Hypertension   . PVC (premature ventricular contraction)   . CVA (cerebral infarction)   . GERD (gastroesophageal reflux disease)   . Diverticulosis   . Sciatica   . Back pain   . Gait disorder   . Cataract   . Edema   . Keratosis   . Sciatica   . Cystic disease of liver   . Paresthesia   . Inguinal hernia   . Hearing loss   . Stroke 2010    no deficits  . Chronic kidney disease 2013    rhabdo  . H/O hiatal hernia  2008    repaired  . Depression 06/06/2012  . Dementia with behavioral disturbance 06/06/2012   Past Surgical History  Procedure Laterality Date  . Prostate surgery      cancer  . Hernia repair    . Appendectomy    . Esophagogastroduodenoscopy  10/25/2011    Procedure: ESOPHAGOGASTRODUODENOSCOPY (EGD);  Surgeon: Winfield Cunas., MD;  Location: [facility] ENDOSCOPY;  Service: Endoscopy;  Laterality: N/A;   Social History:   reports that he has never smoked. He does not have any smokeless tobacco history on file. He reports that he drinks alcohol. He reports that he does not use illicit drugs.  Family History  Problem Relation Age of Onset  .  Hypertension Mother     Medications: Patient's Medications  New Prescriptions   No medications on file  Previous Medications   ACETAMINOPHEN (TYLENOL) 325 MG TABLET    Take 650 mg by mouth every 4 (four) hours as needed.   ACETAMINOPHEN (TYLENOL) 500 MG TABLET    Take 500 mg by mouth at bedtime. To be given with Restoril   AMLODIPINE (NORVASC) 5 MG TABLET    Take 1 tablet (5 mg total) by mouth every morning.   CARBOXYMETHYLCELLULOSE (REFRESH TEARS) 0.5 % SOLN    Place 1 drop into both eyes 3 (three) times daily.   CHOLECALCIFEROL (VITAMIN D) 1000 UNITS TABLET    Take 1,000 Units by mouth every morning.    CYCLOSPORINE (RESTASIS) 0.05 % OPHTHALMIC EMULSION    Place 1 drop into both eyes 2 (two) times daily.    FEEDING SUPPLEMENT (ENSURE CLINICAL STRENGTH) LIQD    Take 237 mLs by mouth 2 (two) times daily with a meal.   FERROUS SULFATE 325 (65 FE) MG TABLET    Take 325 mg by mouth daily with breakfast.   FINASTERIDE (PROSCAR) 5 MG TABLET    Take 5 mg by mouth daily.   HYPROMELLOSE (GENTEAL OP)    Apply 1 drop to eye at bedtime.   MAGNESIUM HYDROXIDE (MILK OF MAGNESIA) 400 MG/5ML SUSPENSION    Take 30 mLs by mouth every 3 (three) days. Please take 30 ml by mouth daily prn for constipation.   MELATONIN 3 MG TABS    Take 3 mg by mouth at bedtime.   MEMANTINE HCL ER (NAMENDA XR) 28 MG CP24    Take 10 mg by mouth 2 (two) times daily.    OMEPRAZOLE (PRILOSEC) 20 MG CAPSULE    Take 20 mg by mouth daily.   OXYCODONE (OXY-IR) 5 MG CAPSULE    Take 5 mg by mouth every 4 (four) hours as needed. pain   POTASSIUM CHLORIDE SA (K-DUR,KLOR-CON) 20 MEQ TABLET    Take 20 mEq by mouth daily.   RANITIDINE (ZANTAC) 150 MG TABLET    Take 150 mg by mouth at bedtime.   SENNA (SENOKOT) 8.6 MG TABLET    Take 2 tablets by mouth 2 (two) times daily.    TAMSULOSIN HCL (FLOMAX) 0.4 MG CAPS    Take 0.4 mg by mouth every morning.    VENLAFAXINE (EFFEXOR) 75 MG TABLET    Take 75 mg by mouth daily.  Modified Medications    No medications on file  Discontinued Medications   No medications on file     Physical Exam  Constitutional: He appears well-developed and well-nourished.  HENT:  Head: Normocephalic and atraumatic. Not macrocephalic.  Nose: Mucosal edema and rhinorrhea present. Right sinus exhibits no maxillary sinus tenderness and no frontal  sinus tenderness. Left sinus exhibits no maxillary sinus tenderness and no frontal sinus tenderness.  Mouth/Throat: Uvula is midline. Posterior oropharyngeal erythema present. No oropharyngeal exudate or posterior oropharyngeal edema.  Eyes: EOM are normal. Pupils are equal, round, and reactive to light.  Neck: Normal range of motion. Neck supple. No JVD present. No thyromegaly present.  Cardiovascular: Normal rate and regular rhythm.   Murmur heard.  Systolic murmur is present with a grade of 2/6  Pulmonary/Chest: Effort normal and breath sounds normal. He has no wheezes. He has no rales.  Abdominal: Soft. Bowel sounds are normal. There is no tenderness.  Musculoskeletal: Normal range of motion. He exhibits no edema.  Scoliokyphosis, walking with walker with SBA  Lymphadenopathy:    He has no cervical adenopathy.  Neurological: He is alert. He displays normal reflexes. No cranial nerve deficit. He exhibits normal muscle tone. Coordination normal.  Skin: Skin is warm and dry. No rash noted. No erythema.  Psychiatric: His mood appears not anxious. His affect is not angry, not blunt and not labile. His speech is not delayed and not slurred. He is not agitated, not aggressive, not slowed, not withdrawn, not actively hallucinating and not combative. Thought content is not paranoid and not delusional. Cognition and memory are impaired. He expresses impulsivity. He does not exhibit a depressed mood. He exhibits abnormal recent memory. He is attentive.    Filed Vitals:   06/08/13 1356  BP: 140/83  Pulse: 74  Temp: 98 F (36.7 C)  TempSrc: Tympanic  Resp: 20       Labs reviewed: Basic Metabolic Panel:  Recent Labs  02/12/13 03/22/13 04/09/13  NA 137 137 136*  K 4.3 4.3 3.9  BUN 20 24* 24*  CREATININE 1.7* 1.8* 1.8*   Liver Function Tests:  Recent Labs  01/29/13 04/09/13  AST 13* 10*  ALT 9* 10  ALKPHOS 108 77   CBC:  Recent Labs  10/05/12 11/30/12 04/09/13  WBC 6.4 7.6 8.2  HGB 11.3* 10.5* 10.4*  HCT 33* 31* 31*  PLT 254 245 274       Assessment/Plan Anemia Stable, Hgb 10.4 04/09/13, continue takes Fe 325mg . Update CBC   BPH (benign prostatic hyperplasia) Recurrent urinary retention-Foley inserted 11/04/12 at ED-successfully removed--continued Tamsulosin and Finasteride   Hypertension Controlled on Amlodipine 5mg      Hypokalemia esolved, K 3.9 04/09/13, continue Kcl. Update BMP   GERD (gastroesophageal reflux disease) Stable on Omeprazole 20mg   and Zantac 75mg , off Mylanta   Depression Mood is ok, but c/o difficulty falling asleep at night sometimes, on Effexor 75mg . Adding prn Melatonin and update CBC+BMP     Dementia with behavioral disturbance Gradual decline  in SNF and continue Namenda   Insomnia Continue routine melatonin and adding prn Melatonin. May have to increase Venlafaxine or hypnotics. Update CBC, BMP, TSH    Family/ Staff Communication: observe the patient.   Goals of Care: SNF  Labs/tests ordered: CBC, CMP, TSH

## 2013-06-08 NOTE — Assessment & Plan Note (Signed)
Mood is ok, but c/o difficulty falling asleep at night sometimes, on Effexor 75mg . Adding prn Melatonin and update CBC+BMP

## 2013-06-08 NOTE — Assessment & Plan Note (Signed)
esolved, K 3.9 04/09/13, continue Kcl. Update BMP

## 2013-06-08 NOTE — Assessment & Plan Note (Addendum)
Stable, Hgb 10.4 04/09/13, continue takes Fe 325mg . Update CBC

## 2013-06-08 NOTE — Assessment & Plan Note (Signed)
Recurrent urinary retention-Foley inserted 11/04/12 at ED-successfully removed--continued Tamsulosin and Finasteride    

## 2013-06-08 NOTE — Assessment & Plan Note (Signed)
Controlled on Amlodipine 5mg   

## 2013-06-11 DIAGNOSIS — D649 Anemia, unspecified: Secondary | ICD-10-CM | POA: Diagnosis not present

## 2013-06-11 DIAGNOSIS — E039 Hypothyroidism, unspecified: Secondary | ICD-10-CM | POA: Diagnosis not present

## 2013-06-11 LAB — BASIC METABOLIC PANEL
BUN: 26 mg/dL — AB (ref 4–21)
Creatinine: 1.6 mg/dL — AB (ref 0.6–1.3)
GLUCOSE: 87 mg/dL
POTASSIUM: 4.4 mmol/L (ref 3.4–5.3)
Sodium: 135 mmol/L — AB (ref 137–147)

## 2013-06-11 LAB — CBC AND DIFFERENTIAL
HEMATOCRIT: 34 % — AB (ref 41–53)
HEMOGLOBIN: 11.5 g/dL — AB (ref 13.5–17.5)
PLATELETS: 287 10*3/uL (ref 150–399)
WBC: 7.6 10*3/mL

## 2013-06-11 LAB — TSH: TSH: 1.04 u[IU]/mL (ref 0.41–5.90)

## 2013-06-19 ENCOUNTER — Non-Acute Institutional Stay (SKILLED_NURSING_FACILITY): Payer: Medicare Other | Admitting: Nurse Practitioner

## 2013-06-19 ENCOUNTER — Encounter: Payer: Self-pay | Admitting: Nurse Practitioner

## 2013-06-19 DIAGNOSIS — G4700 Insomnia, unspecified: Secondary | ICD-10-CM | POA: Diagnosis not present

## 2013-06-19 DIAGNOSIS — I10 Essential (primary) hypertension: Secondary | ICD-10-CM

## 2013-06-19 DIAGNOSIS — D649 Anemia, unspecified: Secondary | ICD-10-CM

## 2013-06-19 DIAGNOSIS — K219 Gastro-esophageal reflux disease without esophagitis: Secondary | ICD-10-CM

## 2013-06-19 DIAGNOSIS — R339 Retention of urine, unspecified: Secondary | ICD-10-CM | POA: Diagnosis not present

## 2013-06-19 DIAGNOSIS — E871 Hypo-osmolality and hyponatremia: Secondary | ICD-10-CM

## 2013-06-19 DIAGNOSIS — N189 Chronic kidney disease, unspecified: Secondary | ICD-10-CM

## 2013-06-19 DIAGNOSIS — M549 Dorsalgia, unspecified: Secondary | ICD-10-CM

## 2013-06-19 DIAGNOSIS — F3289 Other specified depressive episodes: Secondary | ICD-10-CM

## 2013-06-19 DIAGNOSIS — F329 Major depressive disorder, single episode, unspecified: Secondary | ICD-10-CM

## 2013-06-19 DIAGNOSIS — K5900 Constipation, unspecified: Secondary | ICD-10-CM

## 2013-06-19 DIAGNOSIS — E876 Hypokalemia: Secondary | ICD-10-CM

## 2013-06-19 DIAGNOSIS — F32A Depression, unspecified: Secondary | ICD-10-CM

## 2013-06-19 DIAGNOSIS — F03918 Unspecified dementia, unspecified severity, with other behavioral disturbance: Secondary | ICD-10-CM

## 2013-06-19 DIAGNOSIS — F0391 Unspecified dementia with behavioral disturbance: Secondary | ICD-10-CM

## 2013-06-19 DIAGNOSIS — N400 Enlarged prostate without lower urinary tract symptoms: Secondary | ICD-10-CM

## 2013-06-19 NOTE — Assessment & Plan Note (Signed)
Controlled on Amlodipine 5mg   

## 2013-06-19 NOTE — Assessment & Plan Note (Signed)
Stable on Omeprazole 20mg  and Zantac 75mg, off Mylanta    

## 2013-06-19 NOTE — Assessment & Plan Note (Signed)
Recurrent urinary retention-Foley inserted 11/04/12 at ED-successfully removed--continued Tamsulosin and Finasteride    

## 2013-06-19 NOTE — Assessment & Plan Note (Signed)
Improving, Hgb 11.5 06/11/13, continue Fe daily.

## 2013-06-19 NOTE — Assessment & Plan Note (Signed)
failed routine melatonin and  prn Melatonin. Stated he is not in pain and just cannot fall asleep once he is in bed. Will add Mirtazapine 7.5mg  qhs and Lorazepam 0.5mg  qhs for 3 days then prn for a week.

## 2013-06-19 NOTE — Assessment & Plan Note (Signed)
Resolved, Na 135 06/11/13

## 2013-06-19 NOTE — Assessment & Plan Note (Signed)
radual decline  in SNF and continue Namenda    

## 2013-06-19 NOTE — Assessment & Plan Note (Signed)
Tylenol nightly and prn is adequate. Also Oxycodone 5mg q4h prn available to him.    

## 2013-06-19 NOTE — Assessment & Plan Note (Signed)
Managed with Senokot S II bid and MOM q 3days-stable.    

## 2013-06-19 NOTE — Progress Notes (Signed)
Patient ID: Vincent Potts, male   DOB: [birth year], 80 y.o.   MRN: [id number]   Code Status: DNR  Allergies  Allergen Reactions  . Olanzapine Other (See Comments)    unknown  . Rivastigmine Nausea Only    Chief Complaint  Patient presents with  . Medical Management of Chronic Issues  . Acute Visit    insomnia    HPI: Patient is a 80 y.o. male seen in the SNF at [facility] today for evaluation of Insomnia and chronic medical conditions.  Problem List Items Addressed This Visit   Anemia     Improving, Hgb 11.5 06/11/13, continue Fe daily.     Back pain     Tylenol nightly and prn is adequate. Also Oxycodone 5mg  q4h prn available to him.       BPH (benign prostatic hyperplasia)     Recurrent urinary retention-Foley inserted 11/04/12 at ED-successfully removed--continued Tamsulosin and Finasteride      Chronic kidney disease     Creatinine 1.88 01/29/13, 1.96 10/05/12, 1.78 04/09/13, 1.57 06/11/13. Baseline 1.5-2.0      Dementia with behavioral disturbance     radual decline  in SNF and continue Namenda     Relevant Medications      mirtazapine (REMERON) 7.5 MG tablet   Depression     Mood is ok, but c/o difficulty falling asleep at night sometimes, on Effexor 75mg . Failed  Melatonin and unremarkble CBC+BMP+TSH 06/11/13. Will add Mirtazapine 7.5mg  qhs.       Relevant Medications      mirtazapine (REMERON) 7.5 MG tablet   GERD (gastroesophageal reflux disease)     Stable on Omeprazole 20mg   and Zantac 75mg , off Mylanta      Hypertension     Controlled on Amlodipine 5mg       Hypokalemia     Resolved, K 3.9 04/09/13, 4.4 06/11/13. Will reduce Kcl to 95meq daily. BMP in 2 weeks.      Hyponatremia     Resolved, Na 135 06/11/13    Insomnia - Primary     failed routine melatonin and  prn Melatonin. Stated he is not in pain and just cannot fall asleep once he is in bed. Will add Mirtazapine 7.5mg  qhs and Lorazepam 0.5mg  qhs for 3 days then prn for a week.     Unspecified constipation     Managed with Senokot S II bid and MOM q 3days-stable.       Urinary retention     Resolved.        Review of Systems  Constitutional: Negative for fever, chills, weight loss, malaise/fatigue and diaphoresis.  HENT: Positive for hearing loss. Negative for congestion, ear pain and sore throat.   Eyes: Negative for blurred vision, double vision, photophobia, pain, discharge and redness.  Respiratory: Negative for cough, sputum production, shortness of breath and wheezing.   Cardiovascular: Negative for chest pain, palpitations, orthopnea, claudication, leg swelling and PND.  Gastrointestinal: Negative for heartburn, nausea, vomiting, abdominal pain, diarrhea and constipation.       C/o on and off LLQ pain-resolved.   Genitourinary: Negative for dysuria, urgency and flank pain. Frequency: much better.  Musculoskeletal: Positive for back pain and joint pain. Negative for falls and neck pain.  Skin: Negative for itching and rash.  Neurological: Negative for dizziness, tremors, speech change, focal weakness, seizures, loss of consciousness and headaches.  Endo/Heme/Allergies: Negative for environmental allergies and polydipsia. Does not bruise/bleed easily.  Psychiatric/Behavioral: Positive for memory loss. Negative for  depression and hallucinations. The patient has insomnia. The patient is not nervous/anxious.      Past Medical History  Diagnosis Date  . Thyroid disease   . Anemia   . Vitamin D deficiency disease   . Hyponatremia   . Anxiety   . Parkinsonism   . Hearing loss   . Hypertension   . PVC (premature ventricular contraction)   . CVA (cerebral infarction)   . GERD (gastroesophageal reflux disease)   . Diverticulosis   . Sciatica   . Back pain   . Gait disorder   . Cataract   . Edema   . Keratosis   . Sciatica   . Cystic disease of liver   . Paresthesia   . Inguinal hernia   . Hearing loss   . Stroke 2010    no deficits  . Chronic  kidney disease 2013    rhabdo  . H/O hiatal hernia 2008    repaired  . Depression 06/06/2012  . Dementia with behavioral disturbance 06/06/2012   Past Surgical History  Procedure Laterality Date  . Prostate surgery      cancer  . Hernia repair    . Appendectomy    . Esophagogastroduodenoscopy  10/25/2011    Procedure: ESOPHAGOGASTRODUODENOSCOPY (EGD);  Surgeon: Winfield Cunas., MD;  Location: [facility] ENDOSCOPY;  Service: Endoscopy;  Laterality: N/A;   Social History:   reports that he has never smoked. He does not have any smokeless tobacco history on file. He reports that he drinks alcohol. He reports that he does not use illicit drugs.  Family History  Problem Relation Age of Onset  . Hypertension Mother     Medications: Patient's Medications  New Prescriptions   No medications on file  Previous Medications   ACETAMINOPHEN (TYLENOL) 325 MG TABLET    Take 650 mg by mouth every 4 (four) hours as needed.   ACETAMINOPHEN (TYLENOL) 500 MG TABLET    Take 500 mg by mouth at bedtime. To be given with Restoril   AMLODIPINE (NORVASC) 5 MG TABLET    Take 1 tablet (5 mg total) by mouth every morning.   CARBOXYMETHYLCELLULOSE (REFRESH TEARS) 0.5 % SOLN    Place 1 drop into both eyes 3 (three) times daily.   CHOLECALCIFEROL (VITAMIN D) 1000 UNITS TABLET    Take 1,000 Units by mouth every morning.    CYCLOSPORINE (RESTASIS) 0.05 % OPHTHALMIC EMULSION    Place 1 drop into both eyes 2 (two) times daily.    FEEDING SUPPLEMENT (ENSURE CLINICAL STRENGTH) LIQD    Take 237 mLs by mouth 2 (two) times daily with a meal.   FERROUS SULFATE 325 (65 FE) MG TABLET    Take 325 mg by mouth daily with breakfast.   FINASTERIDE (PROSCAR) 5 MG TABLET    Take 5 mg by mouth daily.   HYPROMELLOSE (GENTEAL OP)    Apply 1 drop to eye at bedtime.   MAGNESIUM HYDROXIDE (MILK OF MAGNESIA) 400 MG/5ML SUSPENSION    Take 30 mLs by mouth every 3 (three) days. Please take 30 ml by mouth daily prn for constipation.   MELATONIN 3  MG TABS    Take 3 mg by mouth at bedtime.   MEMANTINE HCL ER (NAMENDA XR) 28 MG CP24    Take 10 mg by mouth 2 (two) times daily.    MIRTAZAPINE (REMERON) 7.5 MG TABLET    Take 7.5 mg by mouth at bedtime.   OMEPRAZOLE (PRILOSEC) 20 MG CAPSULE  Take 20 mg by mouth daily.   OXYCODONE (OXY-IR) 5 MG CAPSULE    Take 5 mg by mouth every 4 (four) hours as needed. pain   POTASSIUM CHLORIDE (K-DUR) 10 MEQ TABLET    Take 10 mEq by mouth daily.   RANITIDINE (ZANTAC) 150 MG TABLET    Take 150 mg by mouth at bedtime.   SENNA (SENOKOT) 8.6 MG TABLET    Take 2 tablets by mouth 2 (two) times daily.    TAMSULOSIN HCL (FLOMAX) 0.4 MG CAPS    Take 0.4 mg by mouth every morning.    VENLAFAXINE (EFFEXOR) 75 MG TABLET    Take 75 mg by mouth daily.  Modified Medications   No medications on file  Discontinued Medications   POTASSIUM CHLORIDE SA (K-DUR,KLOR-CON) 20 MEQ TABLET    Take 20 mEq by mouth daily.     Physical Exam  Constitutional: He appears well-developed and well-nourished.  HENT:  Head: Normocephalic and atraumatic. Not macrocephalic.  Nose: Mucosal edema and rhinorrhea present. Right sinus exhibits no maxillary sinus tenderness and no frontal sinus tenderness. Left sinus exhibits no maxillary sinus tenderness and no frontal sinus tenderness.  Mouth/Throat: Uvula is midline. Posterior oropharyngeal erythema present. No oropharyngeal exudate or posterior oropharyngeal edema.  Eyes: EOM are normal. Pupils are equal, round, and reactive to light.  Neck: Normal range of motion. Neck supple. No JVD present. No thyromegaly present.  Cardiovascular: Normal rate and regular rhythm.   Murmur heard.  Systolic murmur is present with a grade of 2/6  Pulmonary/Chest: Effort normal and breath sounds normal. He has no wheezes. He has no rales.  Abdominal: Soft. Bowel sounds are normal. There is no tenderness.  Musculoskeletal: Normal range of motion. He exhibits no edema.  Scoliokyphosis, walking with walker  with SBA  Lymphadenopathy:    He has no cervical adenopathy.  Neurological: He is alert. He displays normal reflexes. No cranial nerve deficit. He exhibits normal muscle tone. Coordination normal.  Skin: Skin is warm and dry. No rash noted. No erythema.  Psychiatric: His mood appears not anxious. His affect is not angry, not blunt and not labile. His speech is not delayed and not slurred. He is not agitated, not aggressive, not slowed, not withdrawn, not actively hallucinating and not combative. Thought content is not paranoid and not delusional. Cognition and memory are impaired. He expresses impulsivity. He does not exhibit a depressed mood. He exhibits abnormal recent memory. He is attentive.    Filed Vitals:   06/19/13 1158  BP: 143/96  Pulse: 87  Temp: 97.2 F (36.2 C)  TempSrc: Tympanic  Resp: 18      Labs reviewed: Basic Metabolic Panel:  Recent Labs  03/22/13 04/09/13 06/11/13  NA 137 136* 135*  K 4.3 3.9 4.4  BUN 24* 24* 26*  CREATININE 1.8* 1.8* 1.6*  TSH  --   --  1.04   Liver Function Tests:  Recent Labs  01/29/13 04/09/13  AST 13* 10*  ALT 9* 10  ALKPHOS 108 77   CBC:  Recent Labs  11/30/12 04/09/13 06/11/13  WBC 7.6 8.2 7.6  HGB 10.5* 10.4* 11.5*  HCT 31* 31* 34*  PLT 245 274 287       Assessment/Plan Insomnia failed routine melatonin and  prn Melatonin. Stated he is not in pain and just cannot fall asleep once he is in bed. Will add Mirtazapine 7.5mg  qhs and Lorazepam 0.5mg  qhs for 3 days then prn for a week.   Hyponatremia  Resolved, Na 135 06/11/13  Anemia Improving, Hgb 11.5 06/11/13, continue Fe daily.   Urinary retention Resolved.   Unspecified constipation Managed with Senokot S II bid and MOM q 3days-stable.     Hypertension Controlled on Amlodipine 5mg     GERD (gastroesophageal reflux disease) Stable on Omeprazole 20mg   and Zantac 75mg , off Mylanta    Depression Mood is ok, but c/o difficulty falling asleep at night  sometimes, on Effexor 75mg . Failed  Melatonin and unremarkble CBC+BMP+TSH 06/11/13. Will add Mirtazapine 7.5mg  qhs.     Dementia with behavioral disturbance radual decline  in SNF and continue Namenda   Chronic kidney disease Creatinine 1.88 01/29/13, 1.96 10/05/12, 1.78 04/09/13, 1.57 06/11/13. Baseline 1.5-2.0    BPH (benign prostatic hyperplasia) Recurrent urinary retention-Foley inserted 11/04/12 at ED-successfully removed--continued Tamsulosin and Finasteride    Back pain Tylenol nightly and prn is adequate. Also Oxycodone 5mg  q4h prn available to him.     Hypokalemia Resolved, K 3.9 04/09/13, 4.4 06/11/13. Will reduce Kcl to 60meq daily. BMP in 2 weeks.      Family/ Staff Communication: observe the patient.   Goals of Care: SNF  Labs/tests ordered: BMP

## 2013-06-19 NOTE — Assessment & Plan Note (Signed)
Mood is ok, but c/o difficulty falling asleep at night sometimes, on Effexor 75mg . Failed  Melatonin and unremarkble CBC+BMP+TSH 06/11/13. Will add Mirtazapine 7.5mg  qhs.

## 2013-06-19 NOTE — Assessment & Plan Note (Signed)
Resolved

## 2013-06-19 NOTE — Assessment & Plan Note (Signed)
Resolved, K 3.9 04/09/13, 4.4 06/11/13. Will reduce Kcl to 49meq daily. BMP in 2 weeks.

## 2013-06-19 NOTE — Assessment & Plan Note (Signed)
Creatinine 1.88 01/29/13, 1.96 10/05/12, 1.78 04/09/13, 1.57 06/11/13. Baseline 1.5-2.0  

## 2013-06-29 ENCOUNTER — Encounter: Payer: Self-pay | Admitting: Nurse Practitioner

## 2013-06-29 ENCOUNTER — Non-Acute Institutional Stay (SKILLED_NURSING_FACILITY): Payer: Medicare Other | Admitting: Nurse Practitioner

## 2013-06-29 DIAGNOSIS — I10 Essential (primary) hypertension: Secondary | ICD-10-CM

## 2013-06-29 DIAGNOSIS — F3289 Other specified depressive episodes: Secondary | ICD-10-CM

## 2013-06-29 DIAGNOSIS — R112 Nausea with vomiting, unspecified: Secondary | ICD-10-CM

## 2013-06-29 DIAGNOSIS — F329 Major depressive disorder, single episode, unspecified: Secondary | ICD-10-CM

## 2013-06-29 DIAGNOSIS — F32A Depression, unspecified: Secondary | ICD-10-CM

## 2013-06-29 DIAGNOSIS — F03918 Unspecified dementia, unspecified severity, with other behavioral disturbance: Secondary | ICD-10-CM

## 2013-06-29 DIAGNOSIS — K219 Gastro-esophageal reflux disease without esophagitis: Secondary | ICD-10-CM | POA: Diagnosis not present

## 2013-06-29 DIAGNOSIS — K5900 Constipation, unspecified: Secondary | ICD-10-CM | POA: Diagnosis not present

## 2013-06-29 DIAGNOSIS — N400 Enlarged prostate without lower urinary tract symptoms: Secondary | ICD-10-CM

## 2013-06-29 DIAGNOSIS — F0391 Unspecified dementia with behavioral disturbance: Secondary | ICD-10-CM

## 2013-06-29 DIAGNOSIS — G4700 Insomnia, unspecified: Secondary | ICD-10-CM | POA: Diagnosis not present

## 2013-06-29 NOTE — Assessment & Plan Note (Signed)
Stated he is not in pain and just cannot fall asleep once he is in bed. Improved since  Mirtazapine 7.5mg  qhs 06/19/13

## 2013-06-29 NOTE — Assessment & Plan Note (Signed)
Managed with Senokot S II bid and MOM q 3days-stable.    

## 2013-06-29 NOTE — Assessment & Plan Note (Signed)
gradual decline  in SNF and continue Namenda  

## 2013-06-29 NOTE — Assessment & Plan Note (Signed)
better on Effexor 75mg and Mirtazapine 7.5mg qhs.     

## 2013-06-29 NOTE — Assessment & Plan Note (Signed)
Resolved today. The patient stated he has no complaints today.

## 2013-06-29 NOTE — Assessment & Plan Note (Signed)
Controlled on Amlodipine 5mg   

## 2013-06-29 NOTE — Assessment & Plan Note (Signed)
Recurrent urinary retention-Foley inserted 11/04/12 at ED-successfully removed--continued Tamsulosin and Finasteride    

## 2013-06-29 NOTE — Progress Notes (Signed)
Patient ID: Vincent Potts, male   DOB: Mar 16, 1924, 80 y.o.   MRN: [id number]   Code Status: DNR  Allergies  Allergen Reactions  . Olanzapine Other (See Comments)    unknown  . Rivastigmine Nausea Only    Chief Complaint  Patient presents with  . Medical Management of Chronic Issues  . Acute Visit    nauseas x 2 days, vomited "black emesis" x1    HPI: Patient is a 80 y.o. male seen in the SNF at [facility] today for evaluation of resolved nausea/vomited x1  and chronic medical conditions. Staff reported the patient c/o nausea x 2 days, vomited x1 with "black emesis"-it was Oreo cookies the patient ingested later confirmed by a family member. The patient stated he is feeling well, denied nausea, vomiting, constipation, diarrhea, or abd pain today.  Problem List Items Addressed This Visit   BPH (benign prostatic hyperplasia)     Recurrent urinary retention-Foley inserted 11/04/12 at ED-successfully removed--continued Tamsulosin and Finasteride      Dementia with behavioral disturbance     gradual decline  in SNF and continue Namenda      Depression     better on Effexor 75mg  and Mirtazapine 7.5mg  qhs.      GERD (gastroesophageal reflux disease)     Stable on Omeprazole 20mg   and Zantac 75mg , off Mylanta       Hypertension     Controlled on Amlodipine 5mg        Insomnia - Primary     Stated he is not in pain and just cannot fall asleep once he is in bed. Improved since  Mirtazapine 7.5mg  qhs 06/19/13     Nausea with vomiting     Resolved today. The patient stated he has no complaints today.     Unspecified constipation     Managed with Senokot S II bid and MOM q 3days-stable.          Review of Systems  Constitutional: Negative for fever, chills, weight loss, malaise/fatigue and diaphoresis.  HENT: Positive for hearing loss. Negative for congestion, ear pain and sore throat.   Eyes: Negative for blurred vision, double vision, photophobia, pain, discharge  and redness.  Respiratory: Negative for cough, sputum production, shortness of breath and wheezing.   Cardiovascular: Negative for chest pain, palpitations, orthopnea, claudication, leg swelling and PND.  Gastrointestinal: Negative for heartburn, nausea, vomiting, abdominal pain, diarrhea and constipation.       C/o on and off LLQ pain-resolved.   Genitourinary: Negative for dysuria, urgency and flank pain. Frequency: much better.  Musculoskeletal: Positive for back pain and joint pain. Negative for falls and neck pain.  Skin: Negative for itching and rash.  Neurological: Negative for dizziness, tremors, speech change, focal weakness, seizures, loss of consciousness and headaches.  Endo/Heme/Allergies: Negative for environmental allergies and polydipsia. Does not bruise/bleed easily.  Psychiatric/Behavioral: Positive for memory loss. Negative for depression and hallucinations. The patient has insomnia. The patient is not nervous/anxious.      Past Medical History  Diagnosis Date  . Thyroid disease   . Anemia   . Vitamin D deficiency disease   . Hyponatremia   . Anxiety   . Parkinsonism   . Hearing loss   . Hypertension   . PVC (premature ventricular contraction)   . CVA (cerebral infarction)   . GERD (gastroesophageal reflux disease)   . Diverticulosis   . Sciatica   . Back pain   . Gait disorder   . Cataract   .  Edema   . Keratosis   . Sciatica   . Cystic disease of liver   . Paresthesia   . Inguinal hernia   . Hearing loss   . Stroke 2010    no deficits  . Chronic kidney disease 2013    rhabdo  . H/O hiatal hernia 2008    repaired  . Depression 06/06/2012  . Dementia with behavioral disturbance 06/06/2012   Past Surgical History  Procedure Laterality Date  . Prostate surgery      cancer  . Hernia repair    . Appendectomy    . Esophagogastroduodenoscopy  10/25/2011    Procedure: ESOPHAGOGASTRODUODENOSCOPY (EGD);  Surgeon: Winfield Cunas., MD;  Location: [facility]  ENDOSCOPY;  Service: Endoscopy;  Laterality: N/A;   Social History:   reports that he has never smoked. He does not have any smokeless tobacco history on file. He reports that he drinks alcohol. He reports that he does not use illicit drugs.  Family History  Problem Relation Age of Onset  . Hypertension Mother     Medications: Patient's Medications  New Prescriptions   No medications on file  Previous Medications   ACETAMINOPHEN (TYLENOL) 325 MG TABLET    Take 650 mg by mouth every 4 (four) hours as needed.   ACETAMINOPHEN (TYLENOL) 500 MG TABLET    Take 500 mg by mouth at bedtime. To be given with Restoril   AMLODIPINE (NORVASC) 5 MG TABLET    Take 1 tablet (5 mg total) by mouth every morning.   CARBOXYMETHYLCELLULOSE (REFRESH TEARS) 0.5 % SOLN    Place 1 drop into both eyes 3 (three) times daily.   CHOLECALCIFEROL (VITAMIN D) 1000 UNITS TABLET    Take 1,000 Units by mouth every morning.    CYCLOSPORINE (RESTASIS) 0.05 % OPHTHALMIC EMULSION    Place 1 drop into both eyes 2 (two) times daily.    FEEDING SUPPLEMENT (ENSURE CLINICAL STRENGTH) LIQD    Take 237 mLs by mouth 2 (two) times daily with a meal.   FERROUS SULFATE 325 (65 FE) MG TABLET    Take 325 mg by mouth daily with breakfast.   FINASTERIDE (PROSCAR) 5 MG TABLET    Take 5 mg by mouth daily.   HYPROMELLOSE (GENTEAL OP)    Apply 1 drop to eye at bedtime.   MAGNESIUM HYDROXIDE (MILK OF MAGNESIA) 400 MG/5ML SUSPENSION    Take 30 mLs by mouth every 3 (three) days. Please take 30 ml by mouth daily prn for constipation.   MELATONIN 3 MG TABS    Take 3 mg by mouth at bedtime.   MEMANTINE HCL ER (NAMENDA XR) 28 MG CP24    Take 10 mg by mouth 2 (two) times daily.    MIRTAZAPINE (REMERON) 7.5 MG TABLET    Take 7.5 mg by mouth at bedtime.   OMEPRAZOLE (PRILOSEC) 20 MG CAPSULE    Take 20 mg by mouth daily.   OXYCODONE (OXY-IR) 5 MG CAPSULE    Take 5 mg by mouth every 4 (four) hours as needed. pain   POTASSIUM CHLORIDE (K-DUR) 10 MEQ  TABLET    Take 10 mEq by mouth daily.   RANITIDINE (ZANTAC) 150 MG TABLET    Take 150 mg by mouth at bedtime.   SENNA (SENOKOT) 8.6 MG TABLET    Take 2 tablets by mouth 2 (two) times daily.    TAMSULOSIN HCL (FLOMAX) 0.4 MG CAPS    Take 0.4 mg by mouth every morning.  VENLAFAXINE (EFFEXOR) 75 MG TABLET    Take 75 mg by mouth daily.  Modified Medications   No medications on file  Discontinued Medications   No medications on file     Physical Exam  Constitutional: He appears well-developed and well-nourished.  HENT:  Head: Normocephalic and atraumatic. Not macrocephalic.  Nose: Mucosal edema and rhinorrhea present. Right sinus exhibits no maxillary sinus tenderness and no frontal sinus tenderness. Left sinus exhibits no maxillary sinus tenderness and no frontal sinus tenderness.  Mouth/Throat: Uvula is midline. Posterior oropharyngeal erythema present. No oropharyngeal exudate or posterior oropharyngeal edema.  Eyes: EOM are normal. Pupils are equal, round, and reactive to light.  Neck: Normal range of motion. Neck supple. No JVD present. No thyromegaly present.  Cardiovascular: Normal rate and regular rhythm.   Murmur heard.  Systolic murmur is present with a grade of 2/6  Pulmonary/Chest: Effort normal and breath sounds normal. He has no wheezes. He has no rales.  Abdominal: Soft. Bowel sounds are normal. There is no tenderness.  Musculoskeletal: Normal range of motion. He exhibits no edema.  Scoliokyphosis, walking with walker with SBA  Lymphadenopathy:    He has no cervical adenopathy.  Neurological: He is alert. He displays normal reflexes. No cranial nerve deficit. He exhibits normal muscle tone. Coordination normal.  Skin: Skin is warm and dry. No rash noted. No erythema.  Psychiatric: His mood appears not anxious. His affect is not angry, not blunt and not labile. His speech is not delayed and not slurred. He is not agitated, not aggressive, not slowed, not withdrawn, not  actively hallucinating and not combative. Thought content is not paranoid and not delusional. Cognition and memory are impaired. He expresses impulsivity. He does not exhibit a depressed mood. He exhibits abnormal recent memory. He is attentive.    Filed Vitals:   06/29/13 1407  BP: 119/64  Pulse: 80  Temp: 98.1 F (36.7 C)  TempSrc: Tympanic  Resp: 16      Labs reviewed: Basic Metabolic Panel:  Recent Labs  03/22/13 04/09/13 06/11/13  NA 137 136* 135*  K 4.3 3.9 4.4  BUN 24* 24* 26*  CREATININE 1.8* 1.8* 1.6*  TSH  --   --  1.04   Liver Function Tests:  Recent Labs  01/29/13 04/09/13  AST 13* 10*  ALT 9* 10  ALKPHOS 108 77   CBC:  Recent Labs  11/30/12 04/09/13 06/11/13  WBC 7.6 8.2 7.6  HGB 10.5* 10.4* 11.5*  HCT 31* 31* 34*  PLT 245 274 287       Assessment/Plan Insomnia Stated he is not in pain and just cannot fall asleep once he is in bed. Improved since  Mirtazapine 7.5mg  qhs 06/19/13   Unspecified constipation Managed with Senokot S II bid and MOM q 3days-stable.     GERD (gastroesophageal reflux disease) Stable on Omeprazole 20mg   and Zantac 75mg , off Mylanta     BPH (benign prostatic hyperplasia) Recurrent urinary retention-Foley inserted 11/04/12 at ED-successfully removed--continued Tamsulosin and Finasteride    Hypertension Controlled on Amlodipine 5mg      Depression better on Effexor 75mg  and Mirtazapine 7.5mg  qhs.    Dementia with behavioral disturbance gradual decline  in SNF and continue Namenda    Nausea with vomiting Resolved today. The patient stated he has no complaints today.     Family/ Staff Communication: observe the patient.   Goals of Care: SNF  Labs/tests ordered: none

## 2013-06-29 NOTE — Assessment & Plan Note (Signed)
Stable on Omeprazole 20mg  and Zantac 75mg, off Mylanta    

## 2013-07-02 DIAGNOSIS — I10 Essential (primary) hypertension: Secondary | ICD-10-CM | POA: Diagnosis not present

## 2013-07-02 LAB — BASIC METABOLIC PANEL
BUN: 52 mg/dL — AB (ref 4–21)
CREATININE: 2.1 mg/dL — AB (ref 0.6–1.3)
GLUCOSE: 113 mg/dL
POTASSIUM: 3.8 mmol/L (ref 3.4–5.3)
SODIUM: 142 mmol/L (ref 137–147)

## 2013-07-03 ENCOUNTER — Non-Acute Institutional Stay (SKILLED_NURSING_FACILITY): Payer: Medicare Other | Admitting: Nurse Practitioner

## 2013-07-03 ENCOUNTER — Encounter: Payer: Self-pay | Admitting: Nurse Practitioner

## 2013-07-03 DIAGNOSIS — N179 Acute kidney failure, unspecified: Secondary | ICD-10-CM | POA: Diagnosis not present

## 2013-07-03 DIAGNOSIS — D649 Anemia, unspecified: Secondary | ICD-10-CM

## 2013-07-03 DIAGNOSIS — F32A Depression, unspecified: Secondary | ICD-10-CM

## 2013-07-03 DIAGNOSIS — F0391 Unspecified dementia with behavioral disturbance: Secondary | ICD-10-CM

## 2013-07-03 DIAGNOSIS — R112 Nausea with vomiting, unspecified: Secondary | ICD-10-CM

## 2013-07-03 DIAGNOSIS — F3289 Other specified depressive episodes: Secondary | ICD-10-CM

## 2013-07-03 DIAGNOSIS — N400 Enlarged prostate without lower urinary tract symptoms: Secondary | ICD-10-CM

## 2013-07-03 DIAGNOSIS — G4700 Insomnia, unspecified: Secondary | ICD-10-CM | POA: Diagnosis not present

## 2013-07-03 DIAGNOSIS — K5900 Constipation, unspecified: Secondary | ICD-10-CM

## 2013-07-03 DIAGNOSIS — E876 Hypokalemia: Secondary | ICD-10-CM

## 2013-07-03 DIAGNOSIS — F03918 Unspecified dementia, unspecified severity, with other behavioral disturbance: Secondary | ICD-10-CM

## 2013-07-03 DIAGNOSIS — N189 Chronic kidney disease, unspecified: Secondary | ICD-10-CM

## 2013-07-03 DIAGNOSIS — K219 Gastro-esophageal reflux disease without esophagitis: Secondary | ICD-10-CM

## 2013-07-03 DIAGNOSIS — E871 Hypo-osmolality and hyponatremia: Secondary | ICD-10-CM

## 2013-07-03 DIAGNOSIS — I10 Essential (primary) hypertension: Secondary | ICD-10-CM

## 2013-07-03 DIAGNOSIS — F329 Major depressive disorder, single episode, unspecified: Secondary | ICD-10-CM

## 2013-07-03 NOTE — Assessment & Plan Note (Signed)
Stable on Omeprazole 20mg   and Zantac 75mg , off Mylanta

## 2013-07-03 NOTE — Assessment & Plan Note (Signed)
better on Effexor 75mg and Mirtazapine 7.5mg qhs.     

## 2013-07-03 NOTE — Assessment & Plan Note (Signed)
Managed with Senokot S II bid and MOM q 3days-stable.    

## 2013-07-03 NOTE — Assessment & Plan Note (Signed)
Creatinine 1.88 01/29/13, 1.96 10/05/12, 1.78 04/09/13, 1.57 06/11/13. Baseline 1.5-2.0. Worse creatinine 2.09 07/02/13 due to the episode of N/V and decreased oral intake. Eats better now. Will repeat BMP and hope his Bun/creatinine returns to his baseline without IVF.

## 2013-07-03 NOTE — Assessment & Plan Note (Signed)
Creatinine 1.88 01/29/13, 1.96 10/05/12, 1.78 04/09/13, 1.57 06/11/13. Baseline 1.5-2.0

## 2013-07-03 NOTE — Assessment & Plan Note (Signed)
Improving, Hgb 11.5 06/11/13, continue Fe daily.  

## 2013-07-03 NOTE — Assessment & Plan Note (Signed)
Resolved, K 3.8 07/02/13 continue Kcl to 35meq daily.

## 2013-07-03 NOTE — Assessment & Plan Note (Signed)
gradual decline  in SNF and continue Namenda  

## 2013-07-03 NOTE — Assessment & Plan Note (Signed)
His baseline creatinine 1.5-2.0. Creatinine 2.09 07/02/13-likely related decreased oral intake-eats better now-will repeat BMP and continue to encourage oral intake.

## 2013-07-03 NOTE — Assessment & Plan Note (Signed)
Controlled on Amlodipine 5mg   

## 2013-07-03 NOTE — Assessment & Plan Note (Signed)
Resolved, [id number] 07/02/13

## 2013-07-03 NOTE — Progress Notes (Signed)
Patient ID: Vincent Potts, male   DOB: 01/25/1925, 80 y.o.   MRN: [id number]   Code Status: DNR  Allergies  Allergen Reactions  . Olanzapine Other (See Comments)    unknown  . Rivastigmine Nausea Only    Chief Complaint  Patient presents with  . Medical Management of Chronic Issues  . Acute Visit    AKD    HPI: Patient is a 80 y.o. male seen in the SNF at [facility] today for evaluation of acute kidney injury, insomnia, and chronic conditions.  Problem List Items Addressed This Visit   Hypertension     Controlled on Amlodipine 5mg        GERD (gastroesophageal reflux disease)     Stable on Omeprazole 20mg   and Zantac 75mg , off Mylanta      Anemia     Improving, Hgb 11.5 06/11/13, continue Fe daily.     Hyponatremia     Resolved, EP329 07/02/13    Depression     better on Effexor 75mg  and Mirtazapine 7.5mg  qhs.      Dementia with behavioral disturbance     gradual decline  in SNF and continue Namenda     Unspecified constipation     Managed with Senokot S II bid and MOM q 3days-stable.       BPH (benign prostatic hyperplasia)     Creatinine 1.88 01/29/13, 1.96 10/05/12, 1.78 04/09/13, 1.57 06/11/13. Baseline 1.5-2.0     Chronic kidney disease     Creatinine 1.88 01/29/13, 1.96 10/05/12, 1.78 04/09/13, 1.57 06/11/13. Baseline 1.5-2.0. Worse creatinine 2.09 07/02/13 due to the episode of N/V and decreased oral intake. Eats better now. Will repeat BMP and hope his Bun/creatinine returns to his baseline without IVF.      Hypokalemia - Primary     Resolved, K 3.8 07/02/13 continue Kcl to 51meq daily.      Insomnia     Staff reported 07/01/13 the patient stated he slept for 1st time in my bed in weeks. Continue Mirtazapine 7.5mg  qhs.     Nausea with vomiting     Resolved. Eats better.     Acute on chronic renal failure     His baseline creatinine 1.5-2.0. Creatinine 2.09 07/02/13-likely related decreased oral intake-eats better now-will repeat BMP and continue to encourage  oral intake.        Review of Systems  Constitutional: Negative for fever, chills, weight loss, malaise/fatigue and diaphoresis.  HENT: Positive for hearing loss. Negative for congestion, ear pain and sore throat.   Eyes: Negative for blurred vision, double vision, photophobia, pain, discharge and redness.  Respiratory: Negative for cough, sputum production, shortness of breath and wheezing.   Cardiovascular: Negative for chest pain, palpitations, orthopnea, claudication, leg swelling and PND.  Gastrointestinal: Negative for heartburn, nausea, vomiting, abdominal pain, diarrhea and constipation.  Genitourinary: Negative for dysuria, urgency and flank pain. Frequency: much better.  Musculoskeletal: Positive for back pain and joint pain. Negative for falls and neck pain.  Skin: Negative for itching and rash.  Neurological: Negative for dizziness, tremors, speech change, focal weakness, seizures, loss of consciousness and headaches.  Endo/Heme/Allergies: Negative for environmental allergies and polydipsia. Does not bruise/bleed easily.  Psychiatric/Behavioral: Positive for memory loss. Negative for depression and hallucinations. The patient is not nervous/anxious and does not have insomnia.        07/01/13 staff reported the patient stated he slept for 1st time in my bed in weeks.      Past Medical History  Diagnosis Date  . Thyroid disease   . Anemia   . Vitamin D deficiency disease   . Hyponatremia   . Anxiety   . Parkinsonism   . Hearing loss   . Hypertension   . PVC (premature ventricular contraction)   . CVA (cerebral infarction)   . GERD (gastroesophageal reflux disease)   . Diverticulosis   . Sciatica   . Back pain   . Gait disorder   . Cataract   . Edema   . Keratosis   . Sciatica   . Cystic disease of liver   . Paresthesia   . Inguinal hernia   . Hearing loss   . Stroke 2010    no deficits  . Chronic kidney disease 2013    rhabdo  . H/O hiatal hernia 2008     repaired  . Depression 06/06/2012  . Dementia with behavioral disturbance 06/06/2012   Past Surgical History  Procedure Laterality Date  . Prostate surgery      cancer  . Hernia repair    . Appendectomy    . Esophagogastroduodenoscopy  10/25/2011    Procedure: ESOPHAGOGASTRODUODENOSCOPY (EGD);  Surgeon: Winfield Cunas., MD;  Location: [facility] ENDOSCOPY;  Service: Endoscopy;  Laterality: N/A;   Social History:   reports that he has never smoked. He does not have any smokeless tobacco history on file. He reports that he drinks alcohol. He reports that he does not use illicit drugs.  Family History  Problem Relation Age of Onset  . Hypertension Mother     Medications: Patient's Medications  New Prescriptions   No medications on file  Previous Medications   ACETAMINOPHEN (TYLENOL) 325 MG TABLET    Take 650 mg by mouth every 4 (four) hours as needed.   ACETAMINOPHEN (TYLENOL) 500 MG TABLET    Take 500 mg by mouth at bedtime. To be given with Restoril   AMLODIPINE (NORVASC) 5 MG TABLET    Take 1 tablet (5 mg total) by mouth every morning.   CARBOXYMETHYLCELLULOSE (REFRESH TEARS) 0.5 % SOLN    Place 1 drop into both eyes 3 (three) times daily.   CHOLECALCIFEROL (VITAMIN D) 1000 UNITS TABLET    Take 1,000 Units by mouth every morning.    CYCLOSPORINE (RESTASIS) 0.05 % OPHTHALMIC EMULSION    Place 1 drop into both eyes 2 (two) times daily.    FEEDING SUPPLEMENT (ENSURE CLINICAL STRENGTH) LIQD    Take 237 mLs by mouth 2 (two) times daily with a meal.   FERROUS SULFATE 325 (65 FE) MG TABLET    Take 325 mg by mouth daily with breakfast.   FINASTERIDE (PROSCAR) 5 MG TABLET    Take 5 mg by mouth daily.   HYPROMELLOSE (GENTEAL OP)    Apply 1 drop to eye at bedtime.   MAGNESIUM HYDROXIDE (MILK OF MAGNESIA) 400 MG/5ML SUSPENSION    Take 30 mLs by mouth every 3 (three) days. Please take 30 ml by mouth daily prn for constipation.   MELATONIN 3 MG TABS    Take 3 mg by mouth at bedtime.   MEMANTINE HCL ER  (NAMENDA XR) 28 MG CP24    Take 10 mg by mouth 2 (two) times daily.    MIRTAZAPINE (REMERON) 7.5 MG TABLET    Take 7.5 mg by mouth at bedtime.   OMEPRAZOLE (PRILOSEC) 20 MG CAPSULE    Take 20 mg by mouth daily.   OXYCODONE (OXY-IR) 5 MG CAPSULE    Take 5 mg by  mouth every 4 (four) hours as needed. pain   POTASSIUM CHLORIDE (K-DUR) 10 MEQ TABLET    Take 10 mEq by mouth daily.   RANITIDINE (ZANTAC) 150 MG TABLET    Take 150 mg by mouth at bedtime.   SENNA (SENOKOT) 8.6 MG TABLET    Take 2 tablets by mouth 2 (two) times daily.    TAMSULOSIN HCL (FLOMAX) 0.4 MG CAPS    Take 0.4 mg by mouth every morning.    VENLAFAXINE (EFFEXOR) 75 MG TABLET    Take 75 mg by mouth daily.  Modified Medications   No medications on file  Discontinued Medications   No medications on file     Physical Exam  Constitutional: He appears well-developed and well-nourished.  HENT:  Head: Normocephalic and atraumatic. Not macrocephalic.  Nose: Mucosal edema and rhinorrhea present. Right sinus exhibits no maxillary sinus tenderness and no frontal sinus tenderness. Left sinus exhibits no maxillary sinus tenderness and no frontal sinus tenderness.  Mouth/Throat: Uvula is midline. Posterior oropharyngeal erythema present. No oropharyngeal exudate or posterior oropharyngeal edema.  Eyes: EOM are normal. Pupils are equal, round, and reactive to light.  Neck: Normal range of motion. Neck supple. No JVD present. No thyromegaly present.  Cardiovascular: Normal rate and regular rhythm.   Murmur heard.  Systolic murmur is present with a grade of 2/6  Pulmonary/Chest: Effort normal and breath sounds normal. He has no wheezes. He has no rales.  Abdominal: Soft. Bowel sounds are normal. There is no tenderness.  Musculoskeletal: Normal range of motion. He exhibits no edema.  Scoliokyphosis, walking with walker with SBA  Lymphadenopathy:    He has no cervical adenopathy.  Neurological: He is alert. He displays normal reflexes. No  cranial nerve deficit. He exhibits normal muscle tone. Coordination normal.  Skin: Skin is warm and dry. No rash noted. No erythema.  Psychiatric: His mood appears not anxious. His affect is not angry, not blunt and not labile. His speech is not delayed and not slurred. He is not agitated, not aggressive, not slowed, not withdrawn, not actively hallucinating and not combative. Thought content is not paranoid and not delusional. Cognition and memory are impaired. He expresses impulsivity. He does not exhibit a depressed mood. He exhibits abnormal recent memory. He is attentive.    Filed Vitals:   07/03/13 1155  BP: 122/82  Pulse: 86  Temp: 98 F (36.7 C)  TempSrc: Tympanic  Resp: 16      Labs reviewed: Basic Metabolic Panel:  Recent Labs  04/09/13 06/11/13 07/02/13  NA 136* 135* 142  K 3.9 4.4 3.8  BUN 24* 26* 52*  CREATININE 1.8* 1.6* 2.1*  TSH  --  1.04  --    Liver Function Tests:  Recent Labs  01/29/13 04/09/13  AST 13* 10*  ALT 9* 10  ALKPHOS 108 77   CBC:  Recent Labs  11/30/12 04/09/13 06/11/13  WBC 7.6 8.2 7.6  HGB 10.5* 10.4* 11.5*  HCT 31* 31* 34*  PLT 245 274 287       Assessment/Plan Hypokalemia Resolved, K 3.8 07/02/13 continue Kcl to 27meq daily.    Acute on chronic renal failure His baseline creatinine 1.5-2.0. Creatinine 2.09 07/02/13-likely related decreased oral intake-eats better now-will repeat BMP and continue to encourage oral intake.   Nausea with vomiting Resolved. Eats better.   Insomnia Staff reported 07/01/13 the patient stated he slept for 1st time in my bed in weeks. Continue Mirtazapine 7.5mg  qhs.   Chronic kidney disease Creatinine  1.88 01/29/13, 1.96 10/05/12, 1.78 04/09/13, 1.57 06/11/13. Baseline 1.5-2.0. Worse creatinine 2.09 07/02/13 due to the episode of N/V and decreased oral intake. Eats better now. Will repeat BMP and hope his Bun/creatinine returns to his baseline without IVF.    BPH (benign prostatic  hyperplasia) Creatinine 1.88 01/29/13, 1.96 10/05/12, 1.78 04/09/13, 1.57 06/11/13. Baseline 1.5-2.0   Unspecified constipation Managed with Senokot S II bid and MOM q 3days-stable.     Dementia with behavioral disturbance gradual decline  in SNF and continue Namenda   Depression better on Effexor 75mg  and Mirtazapine 7.5mg  qhs.    Hyponatremia Resolved, KX381 07/02/13  GERD (gastroesophageal reflux disease) Stable on Omeprazole 20mg   and Zantac 75mg , off Mylanta    Hypertension Controlled on Amlodipine 5mg      Anemia Improving, Hgb 11.5 06/11/13, continue Fe daily.     Family/ Staff Communication: observe the patient.   Goals of Care: SNF  Labs/tests ordered: BMP

## 2013-07-03 NOTE — Assessment & Plan Note (Signed)
Resolved. Eats better.

## 2013-07-03 NOTE — Assessment & Plan Note (Signed)
Staff reported 07/01/13 the patient stated he slept for 1st time in my bed in weeks. Continue Mirtazapine 7.5mg  qhs.

## 2013-07-05 DIAGNOSIS — Z961 Presence of intraocular lens: Secondary | ICD-10-CM | POA: Diagnosis not present

## 2013-07-05 DIAGNOSIS — H40019 Open angle with borderline findings, low risk, unspecified eye: Secondary | ICD-10-CM | POA: Diagnosis not present

## 2013-07-05 DIAGNOSIS — H524 Presbyopia: Secondary | ICD-10-CM | POA: Diagnosis not present

## 2013-07-05 DIAGNOSIS — I10 Essential (primary) hypertension: Secondary | ICD-10-CM | POA: Diagnosis not present

## 2013-07-05 DIAGNOSIS — H35039 Hypertensive retinopathy, unspecified eye: Secondary | ICD-10-CM | POA: Diagnosis not present

## 2013-07-05 LAB — BASIC METABOLIC PANEL
BUN: 27 mg/dL — AB (ref 4–21)
Creatinine: 1.9 mg/dL — AB (ref 0.6–1.3)
GLUCOSE: 77 mg/dL
Potassium: 3.9 mmol/L (ref 3.4–5.3)
Sodium: 138 mmol/L (ref 137–147)

## 2013-07-06 ENCOUNTER — Other Ambulatory Visit: Payer: Self-pay | Admitting: Nurse Practitioner

## 2013-07-13 ENCOUNTER — Encounter: Payer: Self-pay | Admitting: *Deleted

## 2013-08-03 ENCOUNTER — Non-Acute Institutional Stay (SKILLED_NURSING_FACILITY): Payer: Medicare Other | Admitting: Nurse Practitioner

## 2013-08-03 ENCOUNTER — Encounter: Payer: Self-pay | Admitting: Nurse Practitioner

## 2013-08-03 DIAGNOSIS — E876 Hypokalemia: Secondary | ICD-10-CM

## 2013-08-03 DIAGNOSIS — K219 Gastro-esophageal reflux disease without esophagitis: Secondary | ICD-10-CM

## 2013-08-03 DIAGNOSIS — N400 Enlarged prostate without lower urinary tract symptoms: Secondary | ICD-10-CM | POA: Diagnosis not present

## 2013-08-03 DIAGNOSIS — M549 Dorsalgia, unspecified: Secondary | ICD-10-CM

## 2013-08-03 DIAGNOSIS — I10 Essential (primary) hypertension: Secondary | ICD-10-CM

## 2013-08-03 DIAGNOSIS — N189 Chronic kidney disease, unspecified: Secondary | ICD-10-CM

## 2013-08-03 DIAGNOSIS — F0391 Unspecified dementia with behavioral disturbance: Secondary | ICD-10-CM

## 2013-08-03 DIAGNOSIS — F03918 Unspecified dementia, unspecified severity, with other behavioral disturbance: Secondary | ICD-10-CM

## 2013-08-03 DIAGNOSIS — F3289 Other specified depressive episodes: Secondary | ICD-10-CM

## 2013-08-03 DIAGNOSIS — F32A Depression, unspecified: Secondary | ICD-10-CM

## 2013-08-03 DIAGNOSIS — F329 Major depressive disorder, single episode, unspecified: Secondary | ICD-10-CM

## 2013-08-03 DIAGNOSIS — G4700 Insomnia, unspecified: Secondary | ICD-10-CM

## 2013-08-03 DIAGNOSIS — K5900 Constipation, unspecified: Secondary | ICD-10-CM

## 2013-08-03 DIAGNOSIS — D649 Anemia, unspecified: Secondary | ICD-10-CM

## 2013-08-03 NOTE — Assessment & Plan Note (Signed)
Staff reported 07/01/13 the patient stated he slept for 1st time in my bed in weeks. Continue Mirtazapine 7.5mg  qhs. Stable.

## 2013-08-03 NOTE — Assessment & Plan Note (Signed)
mproving, Hgb 11.5 06/11/13, continue Fe daily.

## 2013-08-03 NOTE — Assessment & Plan Note (Signed)
Resolved, K 3.8 07/02/13 continue Kcl 19meq daily.

## 2013-08-03 NOTE — Progress Notes (Signed)
Patient ID: Vincent Potts, male   DOB: 09-Feb-1925, 80 y.o.   MRN: [id number]   Code Status: DNR  Allergies  Allergen Reactions  . Olanzapine Other (See Comments)    unknown  . Rivastigmine Nausea Only    Chief Complaint  Patient presents with  . Medical Management of Chronic Issues    HPI: Patient is a 80 y.o. male seen in the SNF at [facility] today for evaluation of chronic conditions.  Problem List Items Addressed This Visit   Unspecified constipation     Managed with Senokot S II bid and MOM q 3days-stable.      Insomnia     Staff reported 07/01/13 the patient stated he slept for 1st time in my bed in weeks. Continue Mirtazapine 7.5mg  qhs. Stable.     Hypokalemia     Resolved, K 3.8 07/02/13 continue Kcl 39meq daily.     Hypertension     Controlled on Amlodipine 5mg        GERD (gastroesophageal reflux disease)     Stable on Omeprazole 20mg   and Zantac 75mg       Depression     better on Effexor 75mg  and Mirtazapine 7.5mg  qhs.       Dementia with behavioral disturbance     gradual decline  in SNF and continue Namenda      Chronic kidney disease - Primary     Creatinine 1.88 07/05/13(baseline 1.5-2.0)      BPH (benign prostatic hyperplasia)     Recurrent urinary retention-Foley inserted 11/04/12 at ED-successfully removed--continued Tamsulosin and Finasteride       Back pain     Tylenol nightly and prn is adequate. Also Oxycodone 5mg  q4h prn available to him.       Anemia     mproving, Hgb 11.5 06/11/13, continue Fe daily.         Review of Systems  Constitutional: Negative for fever, chills, weight loss, malaise/fatigue and diaphoresis.  HENT: Positive for hearing loss. Negative for congestion, ear pain and sore throat.   Eyes: Negative for blurred vision, double vision, photophobia, pain, discharge and redness.  Respiratory: Negative for cough, sputum production, shortness of breath and wheezing.   Cardiovascular: Negative for chest pain,  palpitations, orthopnea, claudication, leg swelling and PND.  Gastrointestinal: Negative for heartburn, nausea, vomiting, abdominal pain, diarrhea and constipation.  Genitourinary: Negative for dysuria, urgency and flank pain. Frequency: much better.  Musculoskeletal: Positive for back pain and joint pain. Negative for falls and neck pain.  Skin: Negative for itching and rash.  Neurological: Negative for dizziness, tremors, speech change, focal weakness, seizures, loss of consciousness and headaches.  Endo/Heme/Allergies: Negative for environmental allergies and polydipsia. Does not bruise/bleed easily.  Psychiatric/Behavioral: Positive for memory loss. Negative for depression and hallucinations. The patient is not nervous/anxious and does not have insomnia.        07/01/13 staff reported the patient stated he slept for 1st time in my bed in weeks.      Past Medical History  Diagnosis Date  . Thyroid disease   . Anemia   . Vitamin D deficiency disease   . Hyponatremia   . Anxiety   . Parkinsonism   . Hearing loss   . Hypertension   . PVC (premature ventricular contraction)   . CVA (cerebral infarction)   . GERD (gastroesophageal reflux disease)   . Diverticulosis   . Sciatica   . Back pain   . Gait disorder   . Cataract   .  Edema   . Keratosis   . Sciatica   . Cystic disease of liver   . Paresthesia   . Inguinal hernia   . Hearing loss   . Stroke 2010    no deficits  . Chronic kidney disease 2013    rhabdo  . H/O hiatal hernia 2008    repaired  . Depression 06/06/2012  . Dementia with behavioral disturbance 06/06/2012   Past Surgical History  Procedure Laterality Date  . Prostate surgery      cancer  . Hernia repair    . Appendectomy    . Esophagogastroduodenoscopy  10/25/2011    Procedure: ESOPHAGOGASTRODUODENOSCOPY (EGD);  Surgeon: Winfield Cunas., MD;  Location: [facility] ENDOSCOPY;  Service: Endoscopy;  Laterality: N/A;   Social History:   reports that he has never  smoked. He does not have any smokeless tobacco history on file. He reports that he drinks alcohol. He reports that he does not use illicit drugs.  Family History  Problem Relation Age of Onset  . Hypertension Mother     Medications: Patient's Medications  New Prescriptions   No medications on file  Previous Medications   ACETAMINOPHEN (TYLENOL) 325 MG TABLET    Take 650 mg by mouth every 4 (four) hours as needed.   ACETAMINOPHEN (TYLENOL) 500 MG TABLET    Take 500 mg by mouth at bedtime. To be given with Restoril   AMLODIPINE (NORVASC) 5 MG TABLET    Take 1 tablet (5 mg total) by mouth every morning.   CARBOXYMETHYLCELLULOSE (REFRESH TEARS) 0.5 % SOLN    Place 1 drop into both eyes 3 (three) times daily.   CHOLECALCIFEROL (VITAMIN D) 1000 UNITS TABLET    Take 1,000 Units by mouth every morning.    CYCLOSPORINE (RESTASIS) 0.05 % OPHTHALMIC EMULSION    Place 1 drop into both eyes 2 (two) times daily.    FEEDING SUPPLEMENT (ENSURE CLINICAL STRENGTH) LIQD    Take 237 mLs by mouth 2 (two) times daily with a meal.   FERROUS SULFATE 325 (65 FE) MG TABLET    Take 325 mg by mouth daily with breakfast.   FINASTERIDE (PROSCAR) 5 MG TABLET    Take 5 mg by mouth daily.   HYPROMELLOSE (GENTEAL OP)    Apply 1 drop to eye at bedtime.   MAGNESIUM HYDROXIDE (MILK OF MAGNESIA) 400 MG/5ML SUSPENSION    Take 30 mLs by mouth every 3 (three) days. Please take 30 ml by mouth daily prn for constipation.   MELATONIN 3 MG TABS    Take 3 mg by mouth at bedtime.   MEMANTINE HCL ER (NAMENDA XR) 28 MG CP24    Take 10 mg by mouth 2 (two) times daily.    MIRTAZAPINE (REMERON) 7.5 MG TABLET    Take 7.5 mg by mouth at bedtime.   OMEPRAZOLE (PRILOSEC) 20 MG CAPSULE    Take 20 mg by mouth daily.   OXYCODONE (OXY-IR) 5 MG CAPSULE    Take 5 mg by mouth every 4 (four) hours as needed. pain   POTASSIUM CHLORIDE (K-DUR) 10 MEQ TABLET    Take 10 mEq by mouth daily.   RANITIDINE (ZANTAC) 150 MG TABLET    Take 150 mg by mouth at  bedtime.   SENNA (SENOKOT) 8.6 MG TABLET    Take 2 tablets by mouth 2 (two) times daily.    SODIUM FLUORIDE (PREVIDENT 5000 PLUS) 1.1 % CREA DENTAL CREAM    Place 1 application onto teeth every  evening.   TAMSULOSIN HCL (FLOMAX) 0.4 MG CAPS    Take 0.4 mg by mouth every morning.    VENLAFAXINE (EFFEXOR) 75 MG TABLET    Take 75 mg by mouth daily.  Modified Medications   No medications on file  Discontinued Medications   No medications on file     Physical Exam  Constitutional: He appears well-developed and well-nourished.  HENT:  Head: Normocephalic and atraumatic. Not macrocephalic.  Nose: Mucosal edema and rhinorrhea present. Right sinus exhibits no maxillary sinus tenderness and no frontal sinus tenderness. Left sinus exhibits no maxillary sinus tenderness and no frontal sinus tenderness.  Mouth/Throat: Uvula is midline. Posterior oropharyngeal erythema present. No oropharyngeal exudate or posterior oropharyngeal edema.  Eyes: EOM are normal. Pupils are equal, round, and reactive to light.  Neck: Normal range of motion. Neck supple. No JVD present. No thyromegaly present.  Cardiovascular: Normal rate and regular rhythm.   Murmur heard.  Systolic murmur is present with a grade of 2/6  Pulmonary/Chest: Effort normal and breath sounds normal. He has no wheezes. He has no rales.  Abdominal: Soft. Bowel sounds are normal. There is no tenderness.  Musculoskeletal: Normal range of motion. He exhibits no edema.  Scoliokyphosis, walking with walker with SBA  Lymphadenopathy:    He has no cervical adenopathy.  Neurological: He is alert. He displays normal reflexes. No cranial nerve deficit. He exhibits normal muscle tone. Coordination normal.  Skin: Skin is warm and dry. No rash noted. No erythema.  Psychiatric: His mood appears not anxious. His affect is not angry, not blunt and not labile. His speech is not delayed and not slurred. He is not agitated, not aggressive, not slowed, not  withdrawn, not actively hallucinating and not combative. Thought content is not paranoid and not delusional. Cognition and memory are impaired. He expresses impulsivity. He does not exhibit a depressed mood. He exhibits abnormal recent memory. He is attentive.    Filed Vitals:   08/03/13 1150  BP: 113/74  Pulse: 80  Temp: 98.2 F (36.8 C)  TempSrc: Tympanic  Resp: 20      Labs reviewed: Basic Metabolic Panel:  Recent Labs  06/11/13 07/02/13 07/05/13  NA 135* 142 138  K 4.4 3.8 3.9  BUN 26* 52* 27*  CREATININE 1.6* 2.1* 1.9*  TSH 1.04  --   --    Liver Function Tests:  Recent Labs  01/29/13 04/09/13  AST 13* 10*  ALT 9* 10  ALKPHOS 108 77   CBC:  Recent Labs  11/30/12 04/09/13 06/11/13  WBC 7.6 8.2 7.6  HGB 10.5* 10.4* 11.5*  HCT 31* 31* 34*  PLT 245 274 287       Assessment/Plan Chronic kidney disease Creatinine 1.88 07/05/13(baseline 1.5-2.0)    BPH (benign prostatic hyperplasia) Recurrent urinary retention-Foley inserted 11/04/12 at ED-successfully removed--continued Tamsulosin and Finasteride     GERD (gastroesophageal reflux disease) Stable on Omeprazole 20mg   and Zantac 75mg     Hypertension Controlled on Amlodipine 5mg      Dementia with behavioral disturbance gradual decline  in SNF and continue Namenda    Anemia mproving, Hgb 11.5 06/11/13, continue Fe daily.    Hypokalemia Resolved, K 3.8 07/02/13 continue Kcl 88meq daily.   Depression better on Effexor 75mg  and Mirtazapine 7.5mg  qhs.     Back pain Tylenol nightly and prn is adequate. Also Oxycodone 5mg  q4h prn available to him.     Unspecified constipation Managed with Senokot S II bid and MOM q 3days-stable.  Insomnia Staff reported 07/01/13 the patient stated he slept for 1st time in my bed in weeks. Continue Mirtazapine 7.5mg  qhs. Stable.     Family/ Staff Communication: observe the patient.   Goals of Care: SNF  Labs/tests ordered: none

## 2013-08-03 NOTE — Assessment & Plan Note (Signed)
Controlled on Amlodipine 5mg   

## 2013-08-03 NOTE — Assessment & Plan Note (Signed)
Creatinine 1.88 07/05/13(baseline 1.5-2.0)   

## 2013-08-03 NOTE — Assessment & Plan Note (Signed)
Recurrent urinary retention-Foley inserted 11/04/12 at ED-successfully removed--continued Tamsulosin and Finasteride

## 2013-08-03 NOTE — Assessment & Plan Note (Signed)
Tylenol nightly and prn is adequate. Also Oxycodone 5mg  q4h prn available to him.

## 2013-08-03 NOTE — Assessment & Plan Note (Signed)
better on Effexor 75mg  and Mirtazapine 7.5mg  qhs.

## 2013-08-03 NOTE — Assessment & Plan Note (Signed)
gradual decline  in SNF and continue Namenda  

## 2013-08-03 NOTE — Assessment & Plan Note (Signed)
Managed with Senokot S II bid and MOM q 3days-stable.    

## 2013-08-03 NOTE — Assessment & Plan Note (Signed)
Stable on Omeprazole 20mg   and Zantac 75mg 

## 2013-09-11 ENCOUNTER — Encounter: Payer: Self-pay | Admitting: Nurse Practitioner

## 2013-09-11 ENCOUNTER — Non-Acute Institutional Stay (SKILLED_NURSING_FACILITY): Payer: Medicare Other | Admitting: Nurse Practitioner

## 2013-09-11 DIAGNOSIS — K5900 Constipation, unspecified: Secondary | ICD-10-CM

## 2013-09-11 DIAGNOSIS — K219 Gastro-esophageal reflux disease without esophagitis: Secondary | ICD-10-CM

## 2013-09-11 DIAGNOSIS — I10 Essential (primary) hypertension: Secondary | ICD-10-CM

## 2013-09-11 DIAGNOSIS — F0391 Unspecified dementia with behavioral disturbance: Secondary | ICD-10-CM

## 2013-09-11 DIAGNOSIS — G4700 Insomnia, unspecified: Secondary | ICD-10-CM | POA: Diagnosis not present

## 2013-09-11 DIAGNOSIS — F03918 Unspecified dementia, unspecified severity, with other behavioral disturbance: Secondary | ICD-10-CM

## 2013-09-11 DIAGNOSIS — N400 Enlarged prostate without lower urinary tract symptoms: Secondary | ICD-10-CM

## 2013-09-11 DIAGNOSIS — F329 Major depressive disorder, single episode, unspecified: Secondary | ICD-10-CM

## 2013-09-11 DIAGNOSIS — F32A Depression, unspecified: Secondary | ICD-10-CM

## 2013-09-11 DIAGNOSIS — F3289 Other specified depressive episodes: Secondary | ICD-10-CM

## 2013-09-11 NOTE — Assessment & Plan Note (Signed)
gradual decline  in SNF and continue Namenda  

## 2013-09-11 NOTE — Progress Notes (Signed)
Patient ID: Vincent Potts, male   DOB: 1924/05/22, 80 y.o.   MRN: [id number]   Code Status: DNR  Allergies  Allergen Reactions  . Olanzapine Other (See Comments)    unknown  . Rivastigmine Nausea Only    Chief Complaint  Patient presents with  . Medical Management of Chronic Issues    HPI: Patient is a 80 y.o. male seen in the SNF at [facility] today for evaluation of chronic conditions.  Problem List Items Addressed This Visit   Hypertension     Controlled on Amlodipine 5mg       GERD (gastroesophageal reflux disease) - Primary     Stable, dc Omeprazole 20mg   and continue Zantac 75mg       Depression     better on Effexor 75mg  and Mirtazapine 7.5mg  qhs.        Dementia with behavioral disturbance     gradual decline  in SNF and continue Namenda      Unspecified constipation     Managed with Senokot S II bid and MOM q 3days-stable.       BPH (benign prostatic hyperplasia)     Recurrent urinary retention-Foley inserted 11/04/12 at ED-successfully removed--continued Tamsulosin and Finasteride      Insomnia     Staff reported 07/01/13 the patient stated he slept for 1st time in my bed in weeks. Continue Mirtazapine 7.5mg  qhs. Stable.         Review of Systems  Constitutional: Negative for fever, chills, weight loss, malaise/fatigue and diaphoresis.  HENT: Positive for hearing loss. Negative for congestion, ear pain and sore throat.   Eyes: Negative for blurred vision, double vision, photophobia, pain, discharge and redness.  Respiratory: Negative for cough, sputum production, shortness of breath and wheezing.   Cardiovascular: Negative for chest pain, palpitations, orthopnea, claudication, leg swelling and PND.  Gastrointestinal: Negative for heartburn, nausea, vomiting, abdominal pain, diarrhea and constipation.  Genitourinary: Negative for dysuria, urgency and flank pain. Frequency: much better.  Musculoskeletal: Positive for back pain and joint pain.  Negative for falls and neck pain.  Skin: Negative for itching and rash.  Neurological: Negative for dizziness, tremors, speech change, focal weakness, seizures, loss of consciousness and headaches.  Endo/Heme/Allergies: Negative for environmental allergies and polydipsia. Does not bruise/bleed easily.  Psychiatric/Behavioral: Positive for memory loss. Negative for depression and hallucinations. The patient is not nervous/anxious and does not have insomnia.        07/01/13 staff reported the patient stated he slept for 1st time in my bed in weeks.      Past Medical History  Diagnosis Date  . Thyroid disease   . Anemia   . Vitamin D deficiency disease   . Hyponatremia   . Anxiety   . Parkinsonism   . Hearing loss   . Hypertension   . PVC (premature ventricular contraction)   . CVA (cerebral infarction)   . GERD (gastroesophageal reflux disease)   . Diverticulosis   . Sciatica   . Back pain   . Gait disorder   . Cataract   . Edema   . Keratosis   . Sciatica   . Cystic disease of liver   . Paresthesia   . Inguinal hernia   . Hearing loss   . Stroke 2010    no deficits  . Chronic kidney disease 2013    rhabdo  . H/O hiatal hernia 2008    repaired  . Depression 06/06/2012  . Dementia with behavioral disturbance 06/06/2012  Past Surgical History  Procedure Laterality Date  . Prostate surgery      cancer  . Hernia repair    . Appendectomy    . Esophagogastroduodenoscopy  10/25/2011    Procedure: ESOPHAGOGASTRODUODENOSCOPY (EGD);  Surgeon: Winfield Cunas., MD;  Location: [facility] ENDOSCOPY;  Service: Endoscopy;  Laterality: N/A;   Social History:   reports that he has never smoked. He does not have any smokeless tobacco history on file. He reports that he drinks alcohol. He reports that he does not use illicit drugs.  Family History  Problem Relation Age of Onset  . Hypertension Mother     Medications: Patient's Medications  New Prescriptions   No medications on file    Previous Medications   ACETAMINOPHEN (TYLENOL) 325 MG TABLET    Take 650 mg by mouth every 4 (four) hours as needed.   ACETAMINOPHEN (TYLENOL) 500 MG TABLET    Take 500 mg by mouth at bedtime. To be given with Restoril   AMLODIPINE (NORVASC) 5 MG TABLET    Take 1 tablet (5 mg total) by mouth every morning.   CARBOXYMETHYLCELLULOSE (REFRESH TEARS) 0.5 % SOLN    Place 1 drop into both eyes 3 (three) times daily.   CHOLECALCIFEROL (VITAMIN D) 1000 UNITS TABLET    Take 1,000 Units by mouth every morning.    CYCLOSPORINE (RESTASIS) 0.05 % OPHTHALMIC EMULSION    Place 1 drop into both eyes 2 (two) times daily.    FEEDING SUPPLEMENT (ENSURE CLINICAL STRENGTH) LIQD    Take 237 mLs by mouth 2 (two) times daily with a meal.   FERROUS SULFATE 325 (65 FE) MG TABLET    Take 325 mg by mouth daily with breakfast.   FINASTERIDE (PROSCAR) 5 MG TABLET    Take 5 mg by mouth daily.   HYPROMELLOSE (GENTEAL OP)    Apply 1 drop to eye at bedtime.   MAGNESIUM HYDROXIDE (MILK OF MAGNESIA) 400 MG/5ML SUSPENSION    Take 30 mLs by mouth every 3 (three) days. Please take 30 ml by mouth daily prn for constipation.   MELATONIN 3 MG TABS    Take 3 mg by mouth at bedtime.   MEMANTINE HCL ER (NAMENDA XR) 28 MG CP24    Take 10 mg by mouth 2 (two) times daily.    MIRTAZAPINE (REMERON) 7.5 MG TABLET    Take 7.5 mg by mouth at bedtime.   OXYCODONE (OXY-IR) 5 MG CAPSULE    Take 5 mg by mouth every 4 (four) hours as needed. pain   POTASSIUM CHLORIDE (K-DUR) 10 MEQ TABLET    Take 10 mEq by mouth daily.   RANITIDINE (ZANTAC) 150 MG TABLET    Take 150 mg by mouth at bedtime.   SENNA (SENOKOT) 8.6 MG TABLET    Take 2 tablets by mouth 2 (two) times daily.    SODIUM FLUORIDE (PREVIDENT 5000 PLUS) 1.1 % CREA DENTAL CREAM    Place 1 application onto teeth every evening.   TAMSULOSIN HCL (FLOMAX) 0.4 MG CAPS    Take 0.4 mg by mouth every morning.    VENLAFAXINE (EFFEXOR) 75 MG TABLET    Take 75 mg by mouth daily.  Modified Medications   No  medications on file  Discontinued Medications   OMEPRAZOLE (PRILOSEC) 20 MG CAPSULE    Take 20 mg by mouth daily.     Physical Exam  Constitutional: He appears well-developed and well-nourished.  HENT:  Head: Normocephalic and atraumatic. Not macrocephalic.  Nose:  Mucosal edema and rhinorrhea present. Right sinus exhibits no maxillary sinus tenderness and no frontal sinus tenderness. Left sinus exhibits no maxillary sinus tenderness and no frontal sinus tenderness.  Mouth/Throat: Uvula is midline. Posterior oropharyngeal erythema present. No oropharyngeal exudate or posterior oropharyngeal edema.  Eyes: EOM are normal. Pupils are equal, round, and reactive to light.  Neck: Normal range of motion. Neck supple. No JVD present. No thyromegaly present.  Cardiovascular: Normal rate and regular rhythm.   Murmur heard.  Systolic murmur is present with a grade of 2/6  Pulmonary/Chest: Effort normal and breath sounds normal. He has no wheezes. He has no rales.  Abdominal: Soft. Bowel sounds are normal. There is no tenderness.  Musculoskeletal: Normal range of motion. He exhibits no edema.  Scoliokyphosis, walking with walker with SBA  Lymphadenopathy:    He has no cervical adenopathy.  Neurological: He is alert. He displays normal reflexes. No cranial nerve deficit. He exhibits normal muscle tone. Coordination normal.  Skin: Skin is warm and dry. No rash noted. No erythema.  Psychiatric: His mood appears not anxious. His affect is not angry, not blunt and not labile. His speech is not delayed and not slurred. He is not agitated, not aggressive, not slowed, not withdrawn, not actively hallucinating and not combative. Thought content is not paranoid and not delusional. Cognition and memory are impaired. He expresses impulsivity. He does not exhibit a depressed mood. He exhibits abnormal recent memory. He is attentive.    Filed Vitals:   09/11/13 1426  BP: 123/77  Pulse: 85  Temp: 98.1 F (36.7  C)  TempSrc: Tympanic  Resp: 20      Labs reviewed: Basic Metabolic Panel:  Recent Labs  06/11/13 07/02/13 07/05/13  NA 135* 142 138  K 4.4 3.8 3.9  BUN 26* 52* 27*  CREATININE 1.6* 2.1* 1.9*  TSH 1.04  --   --    Liver Function Tests:  Recent Labs  01/29/13 04/09/13  AST 13* 10*  ALT 9* 10  ALKPHOS 108 77   CBC:  Recent Labs  11/30/12 04/09/13 06/11/13  WBC 7.6 8.2 7.6  HGB 10.5* 10.4* 11.5*  HCT 31* 31* 34*  PLT 245 274 287       Assessment/Plan GERD (gastroesophageal reflux disease) Stable, dc Omeprazole 20mg   and continue Zantac 75mg     Insomnia Staff reported 07/01/13 the patient stated he slept for 1st time in my bed in weeks. Continue Mirtazapine 7.5mg  qhs. Stable.    Unspecified constipation Managed with Senokot S II bid and MOM q 3days-stable.     BPH (benign prostatic hyperplasia) Recurrent urinary retention-Foley inserted 11/04/12 at ED-successfully removed--continued Tamsulosin and Finasteride    Chronic kidney disease Creatinine 1.88 07/05/13(baseline 1.5-2.0)    Hypertension Controlled on Amlodipine 5mg     Anemia improving, Hgb 11.5 06/11/13, dc Fe. Update CBC. Fe, Sat, TIBC, ferritin in 2 weeks.     Dementia with behavioral disturbance gradual decline  in SNF and continue Namenda    Depression better on Effexor 75mg  and Mirtazapine 7.5mg  qhs.        Family/ Staff Communication: observe the patient.   Goals of Care: SNF  Labs/tests ordered: CBC, FE, Fe Sat, TIBC, Ferritin 2 weeks.

## 2013-09-11 NOTE — Assessment & Plan Note (Signed)
better on Effexor 75mg  and Mirtazapine 7.5mg  qhs.

## 2013-09-11 NOTE — Assessment & Plan Note (Signed)
Recurrent urinary retention-Foley inserted 11/04/12 at ED-successfully removed--continued Tamsulosin and Finasteride

## 2013-09-11 NOTE — Assessment & Plan Note (Signed)
Staff reported 07/01/13 the patient stated he slept for 1st time in my bed in weeks. Continue Mirtazapine 7.5mg  qhs. Stable.

## 2013-09-11 NOTE — Assessment & Plan Note (Signed)
improving, Hgb 11.5 06/11/13, dc Fe. Update CBC. Fe, Sat, TIBC, ferritin in 2 weeks.

## 2013-09-11 NOTE — Assessment & Plan Note (Signed)
Creatinine 1.88 07/05/13(baseline 1.5-2.0)   

## 2013-09-11 NOTE — Assessment & Plan Note (Signed)
Controlled on Amlodipine 5mg   

## 2013-09-11 NOTE — Assessment & Plan Note (Signed)
Managed with Senokot S II bid and MOM q 3days-stable.    

## 2013-09-11 NOTE — Assessment & Plan Note (Signed)
Stable, dc Omeprazole 20mg   and continue Zantac 75mg 

## 2013-09-24 DIAGNOSIS — D649 Anemia, unspecified: Secondary | ICD-10-CM | POA: Diagnosis not present

## 2013-09-24 LAB — CBC AND DIFFERENTIAL
HEMATOCRIT: 30 % — AB (ref 41–53)
Hemoglobin: 10.1 g/dL — AB (ref 13.5–17.5)
PLATELETS: 247 10*3/uL (ref 150–399)
WBC: 6.8 10*3/mL

## 2013-09-25 ENCOUNTER — Non-Acute Institutional Stay (SKILLED_NURSING_FACILITY): Payer: Medicare Other | Admitting: Nurse Practitioner

## 2013-09-25 ENCOUNTER — Encounter: Payer: Self-pay | Admitting: Nurse Practitioner

## 2013-09-25 DIAGNOSIS — R350 Frequency of micturition: Secondary | ICD-10-CM | POA: Diagnosis not present

## 2013-09-25 DIAGNOSIS — F32A Depression, unspecified: Secondary | ICD-10-CM

## 2013-09-25 DIAGNOSIS — F0391 Unspecified dementia with behavioral disturbance: Secondary | ICD-10-CM

## 2013-09-25 DIAGNOSIS — R3915 Urgency of urination: Secondary | ICD-10-CM | POA: Diagnosis not present

## 2013-09-25 DIAGNOSIS — I10 Essential (primary) hypertension: Secondary | ICD-10-CM

## 2013-09-25 DIAGNOSIS — N183 Chronic kidney disease, stage 3 unspecified: Secondary | ICD-10-CM

## 2013-09-25 DIAGNOSIS — F3289 Other specified depressive episodes: Secondary | ICD-10-CM

## 2013-09-25 DIAGNOSIS — K5900 Constipation, unspecified: Secondary | ICD-10-CM | POA: Diagnosis not present

## 2013-09-25 DIAGNOSIS — E876 Hypokalemia: Secondary | ICD-10-CM

## 2013-09-25 DIAGNOSIS — F03918 Unspecified dementia, unspecified severity, with other behavioral disturbance: Secondary | ICD-10-CM

## 2013-09-25 DIAGNOSIS — F329 Major depressive disorder, single episode, unspecified: Secondary | ICD-10-CM

## 2013-09-25 DIAGNOSIS — M545 Low back pain, unspecified: Secondary | ICD-10-CM | POA: Diagnosis not present

## 2013-09-25 DIAGNOSIS — K219 Gastro-esophageal reflux disease without esophagitis: Secondary | ICD-10-CM | POA: Diagnosis not present

## 2013-09-25 DIAGNOSIS — D509 Iron deficiency anemia, unspecified: Secondary | ICD-10-CM

## 2013-09-25 DIAGNOSIS — G4700 Insomnia, unspecified: Secondary | ICD-10-CM

## 2013-09-25 DIAGNOSIS — N400 Enlarged prostate without lower urinary tract symptoms: Secondary | ICD-10-CM

## 2013-09-25 NOTE — Assessment & Plan Note (Signed)
Staff reported 07/01/13 the patient stated he slept for 1st time in my bed in weeks. Continue Mirtazapine 7.5mg  qhs. Stable.

## 2013-09-25 NOTE — Assessment & Plan Note (Signed)
Tylenol nightly and prn Tylenol 650mg q4h and Oxycodone 5mg q4h prn available to him. He stated his back pain travels and feels like pain in his stomach.  

## 2013-09-25 NOTE — Assessment & Plan Note (Signed)
Resolved, K 3.8 07/02/13 continue Kcl 59meq daily.

## 2013-09-25 NOTE — Assessment & Plan Note (Signed)
Stable, off Omeprazole 20mg   and continue Zantac 75mg 

## 2013-09-25 NOTE — Assessment & Plan Note (Signed)
Managed with Senokot S II bid and MOM q 3days-stable.    

## 2013-09-25 NOTE — Assessment & Plan Note (Signed)
09/24/13 Iron 36, ferritin 34, TICB256, Fe Sat 14, Hgb 10.1, takes Iron 325mg  daily. IDA. Update CBC in one month.

## 2013-09-25 NOTE — Assessment & Plan Note (Signed)
Controlled on Amlodipine 5mg   

## 2013-09-25 NOTE — Assessment & Plan Note (Signed)
Creatinine 1.88 07/05/13(baseline 1.5-2.0)   

## 2013-09-25 NOTE — Assessment & Plan Note (Addendum)
better on Effexor 75mg and Mirtazapine 7.5mg qhs. Sleeps well at night   

## 2013-09-25 NOTE — Assessment & Plan Note (Signed)
gradual decline  in SNF and continue Namenda  

## 2013-09-25 NOTE — Assessment & Plan Note (Signed)
Recurrent urinary retention-Foley inserted 11/04/12 at ED-successfully removed--continued Tamsulosin and Finasteride

## 2013-09-25 NOTE — Progress Notes (Signed)
Patient ID: Vincent Potts, male   DOB: [birth year], 80 y.o.   MRN: [id number]   Code Status: DNR  Allergies  Allergen Reactions  . Olanzapine Other (See Comments)    unknown  . Rivastigmine Nausea Only    Chief Complaint  Patient presents with  . Medical Management of Chronic Issues  . Acute Visit    "abd pain"    HPI: Patient is a 80 y.o. male seen in the SNF at [facility] today for evaluation of "abd pain" and chronic conditions.  Problem List Items Addressed This Visit   Hypertension     Controlled on Amlodipine 5mg        Back pain - Primary     Tylenol nightly and prn Tylenol 650mg  q4h and Oxycodone 5mg  q4h prn available to him. He stated his back pain travels and feels like pain in his stomach.        GERD (gastroesophageal reflux disease)     Stable, off Omeprazole 20mg   and continue Zantac 75mg        Anemia     09/24/13 Iron 36, ferritin 34, TICB256, Fe Sat 14, Hgb 10.1, takes Iron 325mg  daily. IDA. Update CBC in one month.      Depression     better on Effexor 75mg  and Mirtazapine 7.5mg  qhs. Sleeps well at night.     Dementia with behavioral disturbance     gradual decline  in SNF and continue Namenda       Unspecified constipation     Managed with Senokot S II bid and MOM q 3days-stable.      BPH (benign prostatic hyperplasia)     Recurrent urinary retention-Foley inserted 11/04/12 at ED-successfully removed--continued Tamsulosin and Finasteride       Chronic kidney disease     Creatinine 1.88 07/05/13(baseline 1.5-2.0)       Hypokalemia     Resolved, K 3.8 07/02/13 continue Kcl 30meq daily.      Insomnia     Staff reported 07/01/13 the patient stated he slept for 1st time in my bed in weeks. Continue Mirtazapine 7.5mg  qhs. Stable.          Review of Systems  Constitutional: Negative for fever, chills, weight loss, malaise/fatigue and diaphoresis.  HENT: Positive for hearing loss. Negative for congestion, ear pain and sore throat.     Eyes: Negative for blurred vision, double vision, photophobia, pain, discharge and redness.  Respiratory: Negative for cough, sputum production, shortness of breath and wheezing.   Cardiovascular: Negative for chest pain, palpitations, orthopnea, claudication, leg swelling and PND.  Gastrointestinal: Negative for heartburn, nausea, vomiting, abdominal pain, diarrhea and constipation.       The patient stated his back pain feels like pain in his stomach-Oxycodone relieves pain.   Genitourinary: Negative for dysuria, urgency and flank pain. Frequency: much better.  Musculoskeletal: Positive for back pain and joint pain. Negative for falls and neck pain.  Skin: Negative for itching and rash.  Neurological: Negative for dizziness, tremors, speech change, focal weakness, seizures, loss of consciousness and headaches.  Endo/Heme/Allergies: Negative for environmental allergies and polydipsia. Does not bruise/bleed easily.  Psychiatric/Behavioral: Positive for memory loss. Negative for depression and hallucinations. The patient is not nervous/anxious and does not have insomnia.        07/01/13 staff reported the patient stated he slept for 1st time in my bed in weeks.      Past Medical History  Diagnosis Date  . Thyroid disease   .  Anemia   . Vitamin D deficiency disease   . Hyponatremia   . Anxiety   . Parkinsonism   . Hearing loss   . Hypertension   . PVC (premature ventricular contraction)   . CVA (cerebral infarction)   . GERD (gastroesophageal reflux disease)   . Diverticulosis   . Sciatica   . Back pain   . Gait disorder   . Cataract   . Edema   . Keratosis   . Sciatica   . Cystic disease of liver   . Paresthesia   . Inguinal hernia   . Hearing loss   . Stroke 2010    no deficits  . Chronic kidney disease 2013    rhabdo  . H/O hiatal hernia 2008    repaired  . Depression 06/06/2012  . Dementia with behavioral disturbance 06/06/2012   Past Surgical History  Procedure  Laterality Date  . Prostate surgery      cancer  . Hernia repair    . Appendectomy    . Esophagogastroduodenoscopy  10/25/2011    Procedure: ESOPHAGOGASTRODUODENOSCOPY (EGD);  Surgeon: Winfield Cunas., MD;  Location: [facility] ENDOSCOPY;  Service: Endoscopy;  Laterality: N/A;   Social History:   reports that he has never smoked. He does not have any smokeless tobacco history on file. He reports that he drinks alcohol. He reports that he does not use illicit drugs.  Family History  Problem Relation Age of Onset  . Hypertension Mother     Medications: Patient's Medications  New Prescriptions   No medications on file  Previous Medications   ACETAMINOPHEN (TYLENOL) 325 MG TABLET    Take 650 mg by mouth every 4 (four) hours as needed.   ACETAMINOPHEN (TYLENOL) 500 MG TABLET    Take 500 mg by mouth at bedtime. To be given with Restoril   AMLODIPINE (NORVASC) 5 MG TABLET    Take 1 tablet (5 mg total) by mouth every morning.   CARBOXYMETHYLCELLULOSE (REFRESH TEARS) 0.5 % SOLN    Place 1 drop into both eyes 3 (three) times daily.   CHOLECALCIFEROL (VITAMIN D) 1000 UNITS TABLET    Take 1,000 Units by mouth every morning.    CYCLOSPORINE (RESTASIS) 0.05 % OPHTHALMIC EMULSION    Place 1 drop into both eyes 2 (two) times daily.    FEEDING SUPPLEMENT (ENSURE CLINICAL STRENGTH) LIQD    Take 237 mLs by mouth 2 (two) times daily with a meal.   FERROUS SULFATE 325 (65 FE) MG TABLET    Take 325 mg by mouth daily with breakfast.   FINASTERIDE (PROSCAR) 5 MG TABLET    Take 5 mg by mouth daily.   HYPROMELLOSE (GENTEAL OP)    Apply 1 drop to eye at bedtime.   MAGNESIUM HYDROXIDE (MILK OF MAGNESIA) 400 MG/5ML SUSPENSION    Take 30 mLs by mouth every 3 (three) days. Please take 30 ml by mouth daily prn for constipation.   MELATONIN 3 MG TABS    Take 3 mg by mouth at bedtime.   MEMANTINE HCL ER (NAMENDA XR) 28 MG CP24    Take 10 mg by mouth 2 (two) times daily.    MIRTAZAPINE (REMERON) 7.5 MG TABLET    Take 7.5  mg by mouth at bedtime.   OXYCODONE (OXY-IR) 5 MG CAPSULE    Take 5 mg by mouth every 4 (four) hours as needed. pain   POTASSIUM CHLORIDE (K-DUR) 10 MEQ TABLET    Take 10 mEq by mouth daily.  RANITIDINE (ZANTAC) 150 MG TABLET    Take 150 mg by mouth at bedtime.   SENNA (SENOKOT) 8.6 MG TABLET    Take 2 tablets by mouth 2 (two) times daily.    SODIUM FLUORIDE (PREVIDENT 5000 PLUS) 1.1 % CREA DENTAL CREAM    Place 1 application onto teeth every evening.   TAMSULOSIN HCL (FLOMAX) 0.4 MG CAPS    Take 0.4 mg by mouth every morning.    VENLAFAXINE (EFFEXOR) 75 MG TABLET    Take 75 mg by mouth daily.  Modified Medications   No medications on file  Discontinued Medications   No medications on file     Physical Exam  Constitutional: He appears well-developed and well-nourished.  HENT:  Head: Normocephalic and atraumatic. Not macrocephalic.  Nose: Mucosal edema and rhinorrhea present. Right sinus exhibits no maxillary sinus tenderness and no frontal sinus tenderness. Left sinus exhibits no maxillary sinus tenderness and no frontal sinus tenderness.  Mouth/Throat: Uvula is midline. Posterior oropharyngeal erythema present. No oropharyngeal exudate or posterior oropharyngeal edema.  Eyes: EOM are normal. Pupils are equal, round, and reactive to light.  Neck: Normal range of motion. Neck supple. No JVD present. No thyromegaly present.  Cardiovascular: Normal rate and regular rhythm.   Murmur heard.  Systolic murmur is present with a grade of 2/6  Pulmonary/Chest: Effort normal and breath sounds normal. He has no wheezes. He has no rales.  Abdominal: Soft. Bowel sounds are normal. There is no tenderness.  Musculoskeletal: Normal range of motion. He exhibits tenderness. He exhibits no edema.  Scoliokyphosis, walking with walker with SBA. Chronic back pain.   Lymphadenopathy:    He has no cervical adenopathy.  Neurological: He is alert. He displays normal reflexes. No cranial nerve deficit. He  exhibits normal muscle tone. Coordination normal.  Skin: Skin is warm and dry. No rash noted. No erythema.  Psychiatric: His mood appears not anxious. His affect is not angry, not blunt and not labile. His speech is not delayed and not slurred. He is not agitated, not aggressive, not slowed, not withdrawn, not actively hallucinating and not combative. Thought content is not paranoid and not delusional. Cognition and memory are impaired. He expresses impulsivity. He does not exhibit a depressed mood. He exhibits abnormal recent memory. He is attentive.    Filed Vitals:   09/25/13 1358  BP: 134/74  Pulse: 68  Temp: 98 F (36.7 C)  TempSrc: Tympanic  Resp: 20      Labs reviewed: Basic Metabolic Panel:  Recent Labs  06/11/13 07/02/13 07/05/13  NA 135* 142 138  K 4.4 3.8 3.9  BUN 26* 52* 27*  CREATININE 1.6* 2.1* 1.9*  TSH 1.04  --   --    Liver Function Tests:  Recent Labs  01/29/13 04/09/13  AST 13* 10*  ALT 9* 10  ALKPHOS 108 77   CBC:  Recent Labs  04/09/13 06/11/13 09/24/13  WBC 8.2 7.6 6.8  HGB 10.4* 11.5* 10.1*  HCT 31* 34* 30*  PLT 274 287 247       Assessment/Plan Back pain Tylenol nightly and prn Tylenol 650mg  q4h and Oxycodone 5mg  q4h prn available to him. He stated his back pain travels and feels like pain in his stomach.      Anemia 09/24/13 Iron 36, ferritin 34, TICB256, Fe Sat 14, Hgb 10.1, takes Iron 325mg  daily. IDA. Update CBC in one month.    Unspecified constipation Managed with Senokot S II bid and MOM q 3days-stable.  GERD (gastroesophageal reflux disease) Stable, off Omeprazole 20mg   and continue Zantac 75mg      Dementia with behavioral disturbance gradual decline  in SNF and continue Namenda     Depression better on Effexor 75mg  and Mirtazapine 7.5mg  qhs. Sleeps well at night.   BPH (benign prostatic hyperplasia) Recurrent urinary retention-Foley inserted 11/04/12 at ED-successfully removed--continued Tamsulosin and  Finasteride     Hypertension Controlled on Amlodipine 5mg      Chronic kidney disease Creatinine 1.88 07/05/13(baseline 1.5-2.0)     Hypokalemia Resolved, K 3.8 07/02/13 continue Kcl 55meq daily.    Insomnia Staff reported 07/01/13 the patient stated he slept for 1st time in my bed in weeks. Continue Mirtazapine 7.5mg  qhs. Stable.       Family/ Staff Communication: observe the patient.   Goals of Care: SNF  Labs/tests ordered: none

## 2013-10-16 ENCOUNTER — Encounter: Payer: Self-pay | Admitting: Nurse Practitioner

## 2013-10-16 ENCOUNTER — Non-Acute Institutional Stay (SKILLED_NURSING_FACILITY): Payer: Medicare Other | Admitting: Nurse Practitioner

## 2013-10-16 DIAGNOSIS — N400 Enlarged prostate without lower urinary tract symptoms: Secondary | ICD-10-CM | POA: Diagnosis not present

## 2013-10-16 DIAGNOSIS — F329 Major depressive disorder, single episode, unspecified: Secondary | ICD-10-CM | POA: Diagnosis not present

## 2013-10-16 DIAGNOSIS — K5900 Constipation, unspecified: Secondary | ICD-10-CM

## 2013-10-16 DIAGNOSIS — I10 Essential (primary) hypertension: Secondary | ICD-10-CM

## 2013-10-16 DIAGNOSIS — K219 Gastro-esophageal reflux disease without esophagitis: Secondary | ICD-10-CM

## 2013-10-16 DIAGNOSIS — E876 Hypokalemia: Secondary | ICD-10-CM

## 2013-10-16 DIAGNOSIS — F0391 Unspecified dementia with behavioral disturbance: Secondary | ICD-10-CM

## 2013-10-16 DIAGNOSIS — F3289 Other specified depressive episodes: Secondary | ICD-10-CM | POA: Diagnosis not present

## 2013-10-16 DIAGNOSIS — F03918 Unspecified dementia, unspecified severity, with other behavioral disturbance: Secondary | ICD-10-CM

## 2013-10-16 DIAGNOSIS — M545 Low back pain, unspecified: Secondary | ICD-10-CM

## 2013-10-16 DIAGNOSIS — G4700 Insomnia, unspecified: Secondary | ICD-10-CM

## 2013-10-16 DIAGNOSIS — D508 Other iron deficiency anemias: Secondary | ICD-10-CM

## 2013-10-16 DIAGNOSIS — F32A Depression, unspecified: Secondary | ICD-10-CM

## 2013-10-16 NOTE — Assessment & Plan Note (Signed)
gradual decline  in SNF and continue Namenda  

## 2013-10-16 NOTE — Progress Notes (Signed)
Patient ID: Vincent Potts, male   DOB: 02-06-1925, 80 y.o.   MRN: [id number]   Code Status: DNR  Allergies  Allergen Reactions  . Olanzapine Other (See Comments)    unknown  . Rivastigmine Nausea Only    Chief Complaint  Patient presents with  . Medical Management of Chronic Issues    HPI: Patient is a 80 y.o. male seen in the SNF at [facility] today for evaluation of chronic medical conditions.  Problem List Items Addressed This Visit   Anemia     09/24/13 Iron 36, ferritin 34, TICB256, Fe Sat 14, Hgb 10.1, takes Iron 325mg  daily. IDA. Update CBC     Back pain     Tylenol nightly and prn Tylenol 650mg  q4h and Oxycodone 5mg  q4h prn available to him. He stated his back pain travels and feels like pain in his stomach.        BPH (benign prostatic hyperplasia)     Recurrent urinary retention-Foley inserted 11/04/12 at ED-successfully removed--continued Tamsulosin and Finasteride       Dementia with behavioral disturbance     gradual decline  in SNF and continue Namenda      Depression     better on Effexor 75mg  and Mirtazapine 7.5mg  qhs. Sleeps well at night.      GERD (gastroesophageal reflux disease)     Stable, off Omeprazole 20mg   and continue Zantac 75mg       Hypertension - Primary     Controlled on Amlodipine 5mg         Hypokalemia     Resolved, K 3.8 07/02/13 continue Kcl 67meq daily.      Insomnia     staff reported 07/01/13 the patient stated he slept for 1st time in my bed in weeks. Continue Mirtazapine 7.5mg  qhs. Improved.       Unspecified constipation     Managed with Senokot S II bid and MOM q 3days-stable.         Review of Systems  Constitutional: Negative for fever, chills, weight loss, malaise/fatigue and diaphoresis.  HENT: Positive for hearing loss. Negative for congestion, ear pain and sore throat.   Eyes: Negative for blurred vision, double vision, photophobia, pain, discharge and redness.  Respiratory: Negative for cough,  sputum production, shortness of breath and wheezing.   Cardiovascular: Negative for chest pain, palpitations, orthopnea, claudication, leg swelling and PND.  Gastrointestinal: Negative for heartburn, nausea, vomiting, abdominal pain, diarrhea and constipation.       The patient stated his back pain feels like pain in his stomach-Oxycodone relieves pain.   Genitourinary: Negative for dysuria, urgency and flank pain. Frequency: much better.  Musculoskeletal: Positive for back pain and joint pain. Negative for falls and neck pain.  Skin: Negative for itching and rash.       R upper arm skin tear-not infected.   Neurological: Negative for dizziness, tremors, speech change, focal weakness, seizures, loss of consciousness and headaches.  Endo/Heme/Allergies: Negative for environmental allergies and polydipsia. Does not bruise/bleed easily.  Psychiatric/Behavioral: Positive for memory loss. Negative for depression and hallucinations. The patient is not nervous/anxious and does not have insomnia.        07/01/13 staff reported the patient stated he slept for 1st time in my bed in weeks.      Past Medical History  Diagnosis Date  . Thyroid disease   . Anemia   . Vitamin D deficiency disease   . Hyponatremia   . Anxiety   . Parkinsonism   .  Hearing loss   . Hypertension   . PVC (premature ventricular contraction)   . CVA (cerebral infarction)   . GERD (gastroesophageal reflux disease)   . Diverticulosis   . Sciatica   . Back pain   . Gait disorder   . Cataract   . Edema   . Keratosis   . Sciatica   . Cystic disease of liver   . Paresthesia   . Inguinal hernia   . Hearing loss   . Stroke 2010    no deficits  . Chronic kidney disease 2013    rhabdo  . H/O hiatal hernia 2008    repaired  . Depression 06/06/2012  . Dementia with behavioral disturbance 06/06/2012   Past Surgical History  Procedure Laterality Date  . Prostate surgery      cancer  . Hernia repair    . Appendectomy      . Esophagogastroduodenoscopy  10/25/2011    Procedure: ESOPHAGOGASTRODUODENOSCOPY (EGD);  Surgeon: Winfield Cunas., MD;  Location: [facility] ENDOSCOPY;  Service: Endoscopy;  Laterality: N/A;   Social History:   reports that he has never smoked. He does not have any smokeless tobacco history on file. He reports that he drinks alcohol. He reports that he does not use illicit drugs.  Family History  Problem Relation Age of Onset  . Hypertension Mother     Medications: Patient's Medications  New Prescriptions   No medications on file  Previous Medications   ACETAMINOPHEN (TYLENOL) 325 MG TABLET    Take 650 mg by mouth every 4 (four) hours as needed.   ACETAMINOPHEN (TYLENOL) 500 MG TABLET    Take 500 mg by mouth at bedtime. To be given with Restoril   AMLODIPINE (NORVASC) 5 MG TABLET    Take 1 tablet (5 mg total) by mouth every morning.   CARBOXYMETHYLCELLULOSE (REFRESH TEARS) 0.5 % SOLN    Place 1 drop into both eyes 3 (three) times daily.   CHOLECALCIFEROL (VITAMIN D) 1000 UNITS TABLET    Take 1,000 Units by mouth every morning.    CYCLOSPORINE (RESTASIS) 0.05 % OPHTHALMIC EMULSION    Place 1 drop into both eyes 2 (two) times daily.    FEEDING SUPPLEMENT (ENSURE CLINICAL STRENGTH) LIQD    Take 237 mLs by mouth. Take one can daily as needed   FERROUS SULFATE 325 (65 FE) MG TABLET    Take 325 mg by mouth daily with breakfast.   FINASTERIDE (PROSCAR) 5 MG TABLET    Take 5 mg by mouth daily.   HYPROMELLOSE (GENTEAL OP)    Apply 1 drop to eye at bedtime.   MAGNESIUM HYDROXIDE (MILK OF MAGNESIA) 400 MG/5ML SUSPENSION    Take 30 mLs by mouth every 3 (three) days. Please take 30 ml by mouth daily prn for constipation.   MELATONIN 3 MG TABS    Take 3 mg by mouth at bedtime. Take one additional at bedtime for sleep   MEMANTINE HCL ER (NAMENDA XR) 28 MG CP24    Take 28 mg by mouth. Take one daily for memory   MIRTAZAPINE (REMERON) 7.5 MG TABLET    Take 7.5 mg by mouth at bedtime.   OXYCODONE (OXY-IR)  5 MG CAPSULE    Take 5 mg by mouth every 4 (four) hours as needed. pain   POTASSIUM CHLORIDE (K-DUR) 10 MEQ TABLET    Take 10 mEq by mouth daily.   RANITIDINE (ZANTAC) 150 MG TABLET    Take 150 mg by mouth at  bedtime.   SENNA (SENOKOT) 8.6 MG TABLET    Take 2 tablets by mouth 2 (two) times daily.    SODIUM FLUORIDE (PREVIDENT 5000 PLUS) 1.1 % CREA DENTAL CREAM    Place 1 application onto teeth every evening.   TAMSULOSIN HCL (FLOMAX) 0.4 MG CAPS    Take 0.4 mg by mouth every morning.    VENLAFAXINE (EFFEXOR) 75 MG TABLET    Take 75 mg by mouth daily.  Modified Medications   No medications on file  Discontinued Medications   No medications on file     Physical Exam  Constitutional: He appears well-developed and well-nourished.  HENT:  Head: Normocephalic and atraumatic. Not macrocephalic.  Nose: Mucosal edema and rhinorrhea present. Right sinus exhibits no maxillary sinus tenderness and no frontal sinus tenderness. Left sinus exhibits no maxillary sinus tenderness and no frontal sinus tenderness.  Mouth/Throat: Uvula is midline. Posterior oropharyngeal erythema present. No oropharyngeal exudate or posterior oropharyngeal edema.  Eyes: EOM are normal. Pupils are equal, round, and reactive to light.  Neck: Normal range of motion. Neck supple. No JVD present. No thyromegaly present.  Cardiovascular: Normal rate and regular rhythm.   Murmur heard.  Systolic murmur is present with a grade of 2/6  Pulmonary/Chest: Effort normal and breath sounds normal. He has no wheezes. He has no rales.  Abdominal: Soft. Bowel sounds are normal. There is no tenderness.  Musculoskeletal: Normal range of motion. He exhibits tenderness. He exhibits no edema.  Scoliokyphosis, walking with walker with SBA. Chronic back pain. 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  Lymphadenopathy:    He has no cervical adenopathy.  Neurological: He is alert. He displays normal reflexes. No cranial nerve deficit. He  exhibits normal muscle tone. Coordination normal.  Skin: Skin is warm and dry. No rash noted. No erythema.  R upper arm skin tear-not infected.    Psychiatric: His mood appears not anxious. His affect is not angry, not blunt and not labile. His speech is not delayed and not slurred. He is not agitated, not aggressive, not slowed, not withdrawn, not actively hallucinating and not combative. Thought content is not paranoid and not delusional. Cognition and memory are impaired. He expresses impulsivity. He does not exhibit a depressed mood. He exhibits abnormal recent memory. He is attentive.    Filed Vitals:   10/16/13 1515  BP: 120/68  Pulse: 76  Temp: 98.6 F (37 C)  TempSrc: Tympanic  Resp: 16      Labs reviewed: Basic Metabolic Panel:  Recent Labs  06/11/13 07/02/13 07/05/13  NA 135* 142 138  K 4.4 3.8 3.9  BUN 26* 52* 27*  CREATININE 1.6* 2.1* 1.9*  TSH 1.04  --   --    Liver Function Tests:  Recent Labs  01/29/13 04/09/13  AST 13* 10*  ALT 9* 10  ALKPHOS 108 77   CBC:  Recent Labs  04/09/13 06/11/13 09/24/13  WBC 8.2 7.6 6.8  HGB 10.4* 11.5* 10.1*  HCT 31* 34* 30*  PLT 274 287 247       Assessment/Plan Unspecified constipation Managed with Senokot S II bid and MOM q 3days-stable.    BPH (benign prostatic hyperplasia) Recurrent urinary retention-Foley inserted 11/04/12 at ED-successfully removed--continued Tamsulosin and Finasteride     Depression better on Effexor 75mg  and Mirtazapine 7.5mg  qhs. Sleeps well at night.    Dementia with behavioral disturbance gradual decline  in SNF and continue Namenda    Hypertension Controlled on Amlodipine 5mg   Anemia 09/24/13 Iron 36, ferritin 34, TICB256, Fe Sat 14, Hgb 10.1, takes Iron 325mg  daily. IDA. Update CBC   Hypokalemia Resolved, K 3.8 07/02/13 continue Kcl 76meq daily.    GERD (gastroesophageal reflux disease) Stable, off Omeprazole 20mg   and continue Zantac  75mg     Insomnia staff reported 07/01/13 the patient stated he slept for 1st time in my bed in weeks. Continue Mirtazapine 7.5mg  qhs. Improved.     Back pain Tylenol nightly and prn Tylenol 650mg  q4h and Oxycodone 5mg  q4h prn available to him. He stated his back pain travels and feels like pain in his stomach.        Family/ Staff Communication: observe the patient.   Goals of Care: SNF  Labs/tests ordered: none

## 2013-10-16 NOTE — Assessment & Plan Note (Signed)
Tylenol nightly and prn Tylenol 650mg q4h and Oxycodone 5mg q4h prn available to him. He stated his back pain travels and feels like pain in his stomach.  

## 2013-10-16 NOTE — Assessment & Plan Note (Signed)
better on Effexor 75mg and Mirtazapine 7.5mg qhs. Sleeps well at night   

## 2013-10-16 NOTE — Assessment & Plan Note (Signed)
staff reported 07/01/13 the patient stated he slept for 1st time in my bed in weeks. Continue Mirtazapine 7.5mg qhs. Improved.    

## 2013-10-16 NOTE — Assessment & Plan Note (Signed)
09/24/13 Iron 36, ferritin 34, TICB256, Fe Sat 14, Hgb 10.1, takes Iron 325mg  daily. IDA. Update CBC

## 2013-10-16 NOTE — Assessment & Plan Note (Signed)
Resolved, K 3.8 07/02/13 continue Kcl 67meq daily.

## 2013-10-16 NOTE — Assessment & Plan Note (Signed)
Stable, off Omeprazole 20mg   and continue Zantac 75mg 

## 2013-10-16 NOTE — Assessment & Plan Note (Signed)
Controlled on Amlodipine 5mg   

## 2013-10-16 NOTE — Assessment & Plan Note (Signed)
Recurrent urinary retention-Foley inserted 11/04/12 at ED-successfully removed--continued Tamsulosin and Finasteride

## 2013-10-16 NOTE — Assessment & Plan Note (Signed)
Managed with Senokot S II bid and MOM q 3days-stable.    

## 2013-10-25 DIAGNOSIS — D649 Anemia, unspecified: Secondary | ICD-10-CM | POA: Diagnosis not present

## 2013-10-25 LAB — CBC AND DIFFERENTIAL
HEMATOCRIT: 36 % — AB (ref 41–53)
HEMOGLOBIN: 11.9 g/dL — AB (ref 13.5–17.5)
PLATELETS: 196 10*3/uL (ref 150–399)
WBC: 8.3 10^3/mL

## 2013-10-26 ENCOUNTER — Other Ambulatory Visit: Payer: Self-pay | Admitting: Nurse Practitioner

## 2013-10-26 DIAGNOSIS — D501 Sideropenic dysphagia: Secondary | ICD-10-CM

## 2013-11-06 ENCOUNTER — Encounter: Payer: Self-pay | Admitting: Nurse Practitioner

## 2013-11-06 ENCOUNTER — Non-Acute Institutional Stay (SKILLED_NURSING_FACILITY): Payer: Medicare Other | Admitting: Nurse Practitioner

## 2013-11-06 DIAGNOSIS — D509 Iron deficiency anemia, unspecified: Secondary | ICD-10-CM | POA: Diagnosis not present

## 2013-11-06 DIAGNOSIS — F3289 Other specified depressive episodes: Secondary | ICD-10-CM

## 2013-11-06 DIAGNOSIS — N400 Enlarged prostate without lower urinary tract symptoms: Secondary | ICD-10-CM | POA: Diagnosis not present

## 2013-11-06 DIAGNOSIS — F32A Depression, unspecified: Secondary | ICD-10-CM

## 2013-11-06 DIAGNOSIS — F03918 Unspecified dementia, unspecified severity, with other behavioral disturbance: Secondary | ICD-10-CM

## 2013-11-06 DIAGNOSIS — K219 Gastro-esophageal reflux disease without esophagitis: Secondary | ICD-10-CM | POA: Diagnosis not present

## 2013-11-06 DIAGNOSIS — K5900 Constipation, unspecified: Secondary | ICD-10-CM | POA: Diagnosis not present

## 2013-11-06 DIAGNOSIS — E876 Hypokalemia: Secondary | ICD-10-CM

## 2013-11-06 DIAGNOSIS — F329 Major depressive disorder, single episode, unspecified: Secondary | ICD-10-CM

## 2013-11-06 DIAGNOSIS — G4700 Insomnia, unspecified: Secondary | ICD-10-CM

## 2013-11-06 DIAGNOSIS — F0391 Unspecified dementia with behavioral disturbance: Secondary | ICD-10-CM

## 2013-11-06 NOTE — Assessment & Plan Note (Signed)
09/24/13 Iron 36, ferritin 34, TICB256, Fe Sat 14, Hgb 10.1, takes Iron 325mg  daily. IDA. Hgb 11.9 10/25/13

## 2013-11-06 NOTE — Assessment & Plan Note (Signed)
gradual decline  in SNF and continue Namenda  

## 2013-11-06 NOTE — Assessment & Plan Note (Signed)
Resolved, K 3.8 07/02/13 continue Kcl 31meq daily.

## 2013-11-06 NOTE — Assessment & Plan Note (Signed)
Managed with Senokot S II bid and MOM q 3days-stable.    

## 2013-11-06 NOTE — Assessment & Plan Note (Signed)
staff reported 07/01/13 the patient stated he slept for 1st time in my bed in weeks. Continue Mirtazapine 7.5mg qhs. Improved.    

## 2013-11-06 NOTE — Assessment & Plan Note (Signed)
Stable, off Omeprazole 20mg   and continue Zantac 75mg 

## 2013-11-06 NOTE — Progress Notes (Signed)
Patient ID: Vincent Potts, male   DOB: 1924-04-21, 80 y.o.   MRN: [id number]   Code Status: DNR  Allergies  Allergen Reactions  . Olanzapine Other (See Comments)    unknown  . Rivastigmine Nausea Only    Chief Complaint  Patient presents with  . Medical Management of Chronic Issues  . Acute Visit    urinary frequency    HPI: Patient is a 80 y.o. male seen in the SNF at [facility] today for evaluation of urinary frequency and chronic medical conditions.  Problem List Items Addressed This Visit   GERD (gastroesophageal reflux disease)     Stable, off Omeprazole 20mg   and continue Zantac 75mg      Anemia     09/24/13 Iron 36, ferritin 34, TICB256, Fe Sat 14, Hgb 10.1, takes Iron 325mg  daily. IDA. Hgb 11.9 10/25/13     Depression     gradual decline  in SNF and continue Namenda       Dementia with behavioral disturbance     gradual decline  in SNF and continue Namenda      Unspecified constipation     Managed with Senokot S II bid and MOM q 3days-stable.       BPH (benign prostatic hyperplasia) - Primary     Recurrent urinary retention-Foley inserted 11/04/12 at ED-successfully removed--continued Tamsulosin and Finasteride 11/06/13 c/o urinary frequency-UA C/S and US bladder scan.        Hypokalemia     Resolved, K 3.8 07/02/13 continue Kcl 65meq daily.       Insomnia     staff reported 07/01/13 the patient stated he slept for 1st time in my bed in weeks. Continue Mirtazapine 7.5mg  qhs. Improved.          Review of Systems  Constitutional: Negative for fever, chills, weight loss, malaise/fatigue and diaphoresis.  HENT: Positive for hearing loss. Negative for congestion, ear pain and sore throat.   Eyes: Negative for blurred vision, double vision, photophobia, pain, discharge and redness.  Respiratory: Negative for cough, sputum production, shortness of breath and wheezing.   Cardiovascular: Negative for chest pain, palpitations, orthopnea, claudication, leg  swelling and PND.  Gastrointestinal: Negative for heartburn, nausea, vomiting, abdominal pain, diarrhea and constipation.       The patient stated his back pain feels like pain in his stomach-Oxycodone relieves pain.   Genitourinary: Positive for frequency (much better). Negative for dysuria, urgency and flank pain.  Musculoskeletal: Positive for back pain and joint pain. Negative for falls and neck pain.  Skin: Negative for itching and rash.       R upper arm skin tear-not infected.   Neurological: Negative for dizziness, tremors, speech change, focal weakness, seizures, loss of consciousness and headaches.  Endo/Heme/Allergies: Negative for environmental allergies and polydipsia. Does not bruise/bleed easily.  Psychiatric/Behavioral: Positive for memory loss. Negative for depression and hallucinations. The patient is not nervous/anxious and does not have insomnia.        07/01/13 staff reported the patient stated he slept for 1st time in my bed in weeks.      Past Medical History  Diagnosis Date  . Thyroid disease   . Anemia   . Vitamin D deficiency disease   . Hyponatremia   . Anxiety   . Parkinsonism   . Hearing loss   . Hypertension   . PVC (premature ventricular contraction)   . CVA (cerebral infarction)   . GERD (gastroesophageal reflux disease)   . Diverticulosis   .  Sciatica   . Back pain   . Gait disorder   . Cataract   . Edema   . Keratosis   . Sciatica   . Cystic disease of liver   . Paresthesia   . Inguinal hernia   . Hearing loss   . Stroke 2010    no deficits  . Chronic kidney disease 2013    rhabdo  . H/O hiatal hernia 2008    repaired  . Depression 06/06/2012  . Dementia with behavioral disturbance 06/06/2012   Past Surgical History  Procedure Laterality Date  . Prostate surgery      cancer  . Hernia repair    . Appendectomy    . Esophagogastroduodenoscopy  10/25/2011    Procedure: ESOPHAGOGASTRODUODENOSCOPY (EGD);  Surgeon: Winfield Cunas., MD;   Location: [facility] ENDOSCOPY;  Service: Endoscopy;  Laterality: N/A;   Social History:   reports that he has never smoked. He does not have any smokeless tobacco history on file. He reports that he drinks alcohol. He reports that he does not use illicit drugs.  Family History  Problem Relation Age of Onset  . Hypertension Mother     Medications: Patient's Medications  New Prescriptions   No medications on file  Previous Medications   ACETAMINOPHEN (TYLENOL) 325 MG TABLET    Take 650 mg by mouth every 4 (four) hours as needed.   ACETAMINOPHEN (TYLENOL) 500 MG TABLET    Take 500 mg by mouth at bedtime. To be given with Restoril   AMLODIPINE (NORVASC) 5 MG TABLET    Take 1 tablet (5 mg total) by mouth every morning.   CARBOXYMETHYLCELLULOSE (REFRESH TEARS) 0.5 % SOLN    Place 1 drop into both eyes 3 (three) times daily.   CHOLECALCIFEROL (VITAMIN D) 1000 UNITS TABLET    Take 1,000 Units by mouth every morning.    CYCLOSPORINE (RESTASIS) 0.05 % OPHTHALMIC EMULSION    Place 1 drop into both eyes 2 (two) times daily.    FEEDING SUPPLEMENT (ENSURE CLINICAL STRENGTH) LIQD    Take 237 mLs by mouth. Take one can daily as needed   FERROUS SULFATE 325 (65 FE) MG TABLET    Take 325 mg by mouth daily with breakfast.   FINASTERIDE (PROSCAR) 5 MG TABLET    Take 5 mg by mouth daily.   HYPROMELLOSE (GENTEAL OP)    Apply 1 drop to eye at bedtime.   MAGNESIUM HYDROXIDE (MILK OF MAGNESIA) 400 MG/5ML SUSPENSION    Take 30 mLs by mouth every 3 (three) days. Please take 30 ml by mouth daily prn for constipation.   MELATONIN 3 MG TABS    Take 3 mg by mouth at bedtime. Take one additional at bedtime for sleep   MEMANTINE HCL ER (NAMENDA XR) 28 MG CP24    Take 28 mg by mouth. Take one daily for memory   MIRTAZAPINE (REMERON) 7.5 MG TABLET    Take 7.5 mg by mouth at bedtime.   OXYCODONE (OXY-IR) 5 MG CAPSULE    Take 5 mg by mouth every 4 (four) hours as needed. pain   POTASSIUM CHLORIDE (K-DUR) 10 MEQ TABLET    Take 10  mEq by mouth daily.   RANITIDINE (ZANTAC) 150 MG TABLET    Take 150 mg by mouth at bedtime.   SENNA (SENOKOT) 8.6 MG TABLET    Take 2 tablets by mouth 2 (two) times daily.    SODIUM FLUORIDE (PREVIDENT 5000 PLUS) 1.1 % CREA DENTAL CREAM  Place 1 application onto teeth every evening.   TAMSULOSIN HCL (FLOMAX) 0.4 MG CAPS    Take 0.4 mg by mouth every morning.    VENLAFAXINE (EFFEXOR) 75 MG TABLET    Take 75 mg by mouth daily.  Modified Medications   No medications on file  Discontinued Medications   No medications on file     Physical Exam  Constitutional: He appears well-developed and well-nourished.  HENT:  Head: Normocephalic and atraumatic. Not macrocephalic.  Nose: Mucosal edema and rhinorrhea present. Right sinus exhibits no maxillary sinus tenderness and no frontal sinus tenderness. Left sinus exhibits no maxillary sinus tenderness and no frontal sinus tenderness.  Mouth/Throat: Uvula is midline. Posterior oropharyngeal erythema present. No oropharyngeal exudate or posterior oropharyngeal edema.  Eyes: EOM are normal. Pupils are equal, round, and reactive to light.  Neck: Normal range of motion. Neck supple. No JVD present. No thyromegaly present.  Cardiovascular: Normal rate and regular rhythm.   Murmur heard.  Systolic murmur is present with a grade of 2/6  Pulmonary/Chest: Effort normal and breath sounds normal. He has no wheezes. He has no rales.  Abdominal: Soft. Bowel sounds are normal. There is no tenderness.  Musculoskeletal: Normal range of motion. He exhibits tenderness. He exhibits no edema.  Scoliokyphosis, walking with walker with SBA. Chronic back pain. 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  Lymphadenopathy:    He has no cervical adenopathy.  Neurological: He is alert. He displays normal reflexes. No cranial nerve deficit. He exhibits normal muscle tone. Coordination normal.  Skin: Skin is warm and dry. No rash noted. No erythema.  R upper arm skin  tear-not infected.    Psychiatric: His mood appears not anxious. His affect is not angry, not blunt and not labile. His speech is not delayed and not slurred. He is not agitated, not aggressive, not slowed, not withdrawn, not actively hallucinating and not combative. Thought content is not paranoid and not delusional. Cognition and memory are impaired. He expresses impulsivity. He does not exhibit a depressed mood. He exhibits abnormal recent memory. He is attentive.    Filed Vitals:   11/06/13 1644  BP: 118/69  Pulse: 82  Temp: 97.4 F (36.3 C)  TempSrc: Tympanic  Resp: 20      Labs reviewed: Basic Metabolic Panel:  Recent Labs  06/11/13 07/02/13 07/05/13  NA 135* 142 138  K 4.4 3.8 3.9  BUN 26* 52* 27*  CREATININE 1.6* 2.1* 1.9*  TSH 1.04  --   --    Liver Function Tests:  Recent Labs  01/29/13 04/09/13  AST 13* 10*  ALT 9* 10  ALKPHOS 108 77   CBC:  Recent Labs  06/11/13 09/24/13 10/25/13  WBC 7.6 6.8 8.3  HGB 11.5* 10.1* 11.9*  HCT 34* 30* 36*  PLT 287 247 196       Assessment/Plan Unspecified constipation Managed with Senokot S II bid and MOM q 3days-stable.     GERD (gastroesophageal reflux disease) Stable, off Omeprazole 20mg   and continue Zantac 75mg    Anemia 09/24/13 Iron 36, ferritin 34, TICB256, Fe Sat 14, Hgb 10.1, takes Iron 325mg  daily. IDA. Hgb 11.9 10/25/13   Depression gradual decline  in SNF and continue Namenda     Dementia with behavioral disturbance gradual decline  in SNF and continue Namenda    BPH (benign prostatic hyperplasia) Recurrent urinary retention-Foley inserted 11/04/12 at ED-successfully removed--continued Tamsulosin and Finasteride 11/06/13 c/o urinary frequency-UA C/S and US bladder scan.  Hypokalemia Resolved, K 3.8 07/02/13 continue Kcl 69meq daily.     Insomnia staff reported 07/01/13 the patient stated he slept for 1st time in my bed in weeks. Continue Mirtazapine 7.5mg  qhs. Improved.        Family/ Staff Communication: observe the patient.   Goals of Care: SNF  Labs/tests ordered: UA C/S. US bladder scan.

## 2013-11-06 NOTE — Assessment & Plan Note (Signed)
Recurrent urinary retention-Foley inserted 11/04/12 at ED-successfully removed--continued Tamsulosin and Finasteride 11/06/13 c/o urinary frequency-UA C/S and US bladder scan.

## 2013-11-07 DIAGNOSIS — N390 Urinary tract infection, site not specified: Secondary | ICD-10-CM | POA: Diagnosis not present

## 2013-11-07 DIAGNOSIS — R350 Frequency of micturition: Secondary | ICD-10-CM | POA: Diagnosis not present

## 2013-11-09 ENCOUNTER — Encounter: Payer: Self-pay | Admitting: Nurse Practitioner

## 2013-11-20 DIAGNOSIS — H04129 Dry eye syndrome of unspecified lacrimal gland: Secondary | ICD-10-CM | POA: Diagnosis not present

## 2013-11-20 DIAGNOSIS — H18519 Endothelial corneal dystrophy, unspecified eye: Secondary | ICD-10-CM | POA: Diagnosis not present

## 2013-12-28 ENCOUNTER — Non-Acute Institutional Stay (SKILLED_NURSING_FACILITY): Payer: Medicare Other | Admitting: Nurse Practitioner

## 2013-12-28 ENCOUNTER — Encounter: Payer: Self-pay | Admitting: Nurse Practitioner

## 2013-12-28 DIAGNOSIS — F329 Major depressive disorder, single episode, unspecified: Secondary | ICD-10-CM

## 2013-12-28 DIAGNOSIS — D649 Anemia, unspecified: Secondary | ICD-10-CM

## 2013-12-28 DIAGNOSIS — G4700 Insomnia, unspecified: Secondary | ICD-10-CM

## 2013-12-28 DIAGNOSIS — F32A Depression, unspecified: Secondary | ICD-10-CM

## 2013-12-28 DIAGNOSIS — K5900 Constipation, unspecified: Secondary | ICD-10-CM

## 2013-12-28 DIAGNOSIS — I10 Essential (primary) hypertension: Secondary | ICD-10-CM | POA: Diagnosis not present

## 2013-12-28 DIAGNOSIS — N400 Enlarged prostate without lower urinary tract symptoms: Secondary | ICD-10-CM

## 2013-12-28 DIAGNOSIS — K219 Gastro-esophageal reflux disease without esophagitis: Secondary | ICD-10-CM

## 2013-12-28 DIAGNOSIS — E876 Hypokalemia: Secondary | ICD-10-CM

## 2013-12-28 DIAGNOSIS — F03918 Unspecified dementia, unspecified severity, with other behavioral disturbance: Secondary | ICD-10-CM

## 2013-12-28 DIAGNOSIS — M5440 Lumbago with sciatica, unspecified side: Secondary | ICD-10-CM | POA: Diagnosis not present

## 2013-12-28 DIAGNOSIS — F0391 Unspecified dementia with behavioral disturbance: Secondary | ICD-10-CM

## 2013-12-28 NOTE — Progress Notes (Signed)
Patient ID: Vincent Potts, male   DOB: 01-18-1925, 80 y.o.   MRN: [id number] Patient ID: Vincent Potts, male   DOB: [birth year], 80 y.o.   MRN: [id number]   Code Status: DNR  Allergies  Allergen Reactions  . Olanzapine Other (See Comments)    unknown  . Rivastigmine Nausea Only    Chief Complaint  Patient presents with  . Medical Management of Chronic Issues    HPI: Patient is a 80 y.o. male seen in the SNF at [facility] today for evaluation of urinary frequency and chronic medical conditions.  Problem List Items Addressed This Visit   None      Review of Systems  Constitutional: Negative for fever, chills, weight loss, malaise/fatigue and diaphoresis.  HENT: Positive for hearing loss. Negative for congestion, ear pain and sore throat.   Eyes: Negative for blurred vision, double vision, photophobia, pain, discharge and redness.  Respiratory: Negative for cough, sputum production, shortness of breath and wheezing.   Cardiovascular: Negative for chest pain, palpitations, orthopnea, claudication, leg swelling and PND.  Gastrointestinal: Negative for heartburn, nausea, vomiting, abdominal pain, diarrhea and constipation.       The patient stated his back pain feels like pain in his stomach-Oxycodone relieves pain.   Genitourinary: Positive for frequency (much better). Negative for dysuria, urgency and flank pain.  Musculoskeletal: Positive for back pain and joint pain. Negative for falls and neck pain.  Skin: Negative for itching and rash.       R upper arm skin tear-not infected.   Neurological: Negative for dizziness, tremors, speech change, focal weakness, seizures, loss of consciousness and headaches.  Endo/Heme/Allergies: Negative for environmental allergies and polydipsia. Does not bruise/bleed easily.  Psychiatric/Behavioral: Positive for memory loss. Negative for depression and hallucinations. The patient is not nervous/anxious and does not have insomnia.        07/01/13  staff reported the patient stated he slept for 1st time in my bed in weeks.      Past Medical History  Diagnosis Date  . Thyroid disease   . Anemia   . Vitamin D deficiency disease   . Hyponatremia   . Anxiety   . Parkinsonism   . Hearing loss   . Hypertension   . PVC (premature ventricular contraction)   . CVA (cerebral infarction)   . GERD (gastroesophageal reflux disease)   . Diverticulosis   . Sciatica   . Back pain   . Gait disorder   . Cataract   . Edema   . Keratosis   . Sciatica   . Cystic disease of liver   . Paresthesia   . Inguinal hernia   . Hearing loss   . Stroke 2010    no deficits  . Chronic kidney disease 2013    rhabdo  . H/O hiatal hernia 2008    repaired  . Depression 06/06/2012  . Dementia with behavioral disturbance 06/06/2012   Past Surgical History  Procedure Laterality Date  . Prostate surgery      cancer  . Hernia repair    . Appendectomy    . Esophagogastroduodenoscopy  10/25/2011    Procedure: ESOPHAGOGASTRODUODENOSCOPY (EGD);  Surgeon: Winfield Cunas., MD;  Location: [facility] ENDOSCOPY;  Service: Endoscopy;  Laterality: N/A;   Social History:   reports that he has never smoked. He does not have any smokeless tobacco history on file. He reports that he drinks alcohol. He reports that he does not use illicit drugs.  Family History  Problem Relation Age of Onset  . Hypertension Mother     Medications: Patient's Medications  New Prescriptions   No medications on file  Previous Medications   ACETAMINOPHEN (TYLENOL) 325 MG TABLET    Take 650 mg by mouth every 4 (four) hours as needed.   ACETAMINOPHEN (TYLENOL) 500 MG TABLET    Take 500 mg by mouth at bedtime. To be given with Restoril   AMLODIPINE (NORVASC) 5 MG TABLET    Take 1 tablet (5 mg total) by mouth every morning.   CARBOXYMETHYLCELLULOSE (REFRESH TEARS) 0.5 % SOLN    Place 1 drop into both eyes 3 (three) times daily.   CHOLECALCIFEROL (VITAMIN D) 1000 UNITS TABLET    Take 1,000  Units by mouth every morning.    CYCLOSPORINE (RESTASIS) 0.05 % OPHTHALMIC EMULSION    Place 1 drop into both eyes 2 (two) times daily.    FEEDING SUPPLEMENT (ENSURE CLINICAL STRENGTH) LIQD    Take 237 mLs by mouth. Take one can daily as needed   FERROUS SULFATE 325 (65 FE) MG TABLET    Take 325 mg by mouth daily with breakfast.   FINASTERIDE (PROSCAR) 5 MG TABLET    Take 5 mg by mouth daily.   HYPROMELLOSE (GENTEAL OP)    Apply 1 drop to eye at bedtime.   MAGNESIUM HYDROXIDE (MILK OF MAGNESIA) 400 MG/5ML SUSPENSION    Take 30 mLs by mouth every 3 (three) days. Please take 30 ml by mouth daily prn for constipation.   MELATONIN 3 MG TABS    Take 3 mg by mouth at bedtime. Take one additional at bedtime for sleep   MEMANTINE HCL ER (NAMENDA XR) 28 MG CP24    Take 28 mg by mouth. Take one daily for memory   MIRTAZAPINE (REMERON) 7.5 MG TABLET    Take 7.5 mg by mouth at bedtime.   OXYCODONE (OXY-IR) 5 MG CAPSULE    Take 5 mg by mouth every 4 (four) hours as needed. pain   POTASSIUM CHLORIDE (K-DUR) 10 MEQ TABLET    Take 10 mEq by mouth daily.   RANITIDINE (ZANTAC) 150 MG TABLET    Take 150 mg by mouth at bedtime.   SENNA (SENOKOT) 8.6 MG TABLET    Take 2 tablets by mouth 2 (two) times daily.    SODIUM FLUORIDE (PREVIDENT 5000 PLUS) 1.1 % CREA DENTAL CREAM    Place 1 application onto teeth every evening.   TAMSULOSIN HCL (FLOMAX) 0.4 MG CAPS    Take 0.4 mg by mouth every morning.    VENLAFAXINE (EFFEXOR) 75 MG TABLET    Take 75 mg by mouth daily.  Modified Medications   No medications on file  Discontinued Medications   No medications on file     Physical Exam  Constitutional: He appears well-developed and well-nourished.  HENT:  Head: Normocephalic and atraumatic. Not macrocephalic.  Nose: Mucosal edema and rhinorrhea present. Right sinus exhibits no maxillary sinus tenderness and no frontal sinus tenderness. Left sinus exhibits no maxillary sinus tenderness and no frontal sinus tenderness.    Mouth/Throat: Uvula is midline. Posterior oropharyngeal erythema present. No oropharyngeal exudate or posterior oropharyngeal edema.  Eyes: EOM are normal. Pupils are equal, round, and reactive to light.  Neck: Normal range of motion. Neck supple. No JVD present. No thyromegaly present.  Cardiovascular: Normal rate and regular rhythm.   Murmur heard.  Systolic murmur is present with a grade of 2/6  Pulmonary/Chest: Effort normal and breath sounds normal.  He has no wheezes. He has no rales.  Abdominal: Soft. Bowel sounds are normal. There is no tenderness.  Musculoskeletal: Normal range of motion. He exhibits tenderness. He exhibits no edema.  Scoliokyphosis, walking with walker with SBA. Chronic back pain. 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  Lymphadenopathy:    He has no cervical adenopathy.  Neurological: He is alert. He displays normal reflexes. No cranial nerve deficit. He exhibits normal muscle tone. Coordination normal.  Skin: Skin is warm and dry. No rash noted. No erythema.  R upper arm skin tear-not infected.    Psychiatric: His mood appears not anxious. His affect is not angry, not blunt and not labile. His speech is not delayed and not slurred. He is not agitated, not aggressive, not slowed, not withdrawn, not actively hallucinating and not combative. Thought content is not paranoid and not delusional. Cognition and memory are impaired. He expresses impulsivity. He does not exhibit a depressed mood. He exhibits abnormal recent memory. He is attentive.    Filed Vitals:   12/28/13 2211  BP: 148/84  Pulse: 78  Temp: 96.4 F (35.8 C)  TempSrc: Tympanic  Resp: 20      Labs reviewed: Basic Metabolic Panel:  Recent Labs  06/11/13 07/02/13 07/05/13  NA 135* 142 138  K 4.4 3.8 3.9  BUN 26* 52* 27*  CREATININE 1.6* 2.1* 1.9*  TSH 1.04  --   --    Liver Function Tests:  Recent Labs  01/29/13 04/09/13  AST 13* 10*  ALT 9* 10  ALKPHOS 108 77    CBC:  Recent Labs  06/11/13 09/24/13 10/25/13  WBC 7.6 6.8 8.3  HGB 11.5* 10.1* 11.9*  HCT 34* 30* 36*  PLT 287 247 196       Assessment/Plan No problem-specific assessment & plan notes found for this encounter.   Family/ Staff Communication: observe the patient.   Goals of Care: SNF  Labs/tests ordered: UA C/S. US bladder scan.

## 2013-12-30 NOTE — Assessment & Plan Note (Signed)
Controlled on Amlodipine 5mg   

## 2013-12-30 NOTE — Assessment & Plan Note (Signed)
gradual decline  in SNF and continue Namenda  

## 2013-12-30 NOTE — Assessment & Plan Note (Signed)
Tylenol nightly and prn Tylenol 650mg q4h and Oxycodone 5mg q4h prn available to him. He stated his back pain travels and feels like pain in his stomach.  

## 2013-12-30 NOTE — Assessment & Plan Note (Signed)
Managed with Senokot S II bid and MOM q 3days-stable.    

## 2013-12-30 NOTE — Assessment & Plan Note (Signed)
09/11/13 consultant pharmacist: dc Omeprazole  12/28/13 stable. continue Zantac 75mg 

## 2013-12-30 NOTE — Assessment & Plan Note (Signed)
09/24/13 Iron 36, ferritin 34, TICB256, Fe Sat 14, Hgb 10.1, takes Iron 325mg  daily. IDA 10/25/13 Hgb 11.9

## 2013-12-30 NOTE — Assessment & Plan Note (Signed)
Resolved, K 3.8 07/02/13, takes Kcl 8meq daily.

## 2013-12-30 NOTE — Assessment & Plan Note (Signed)
Recurrent urinary retention-Foley inserted 11/04/12 at ED-successfully removed--continued Tamsulosin and Finasteride 11/06/13 c/o urinary frequency-UA C/S and US bladder scan.   11/07/13 US bladder: no significant postvoid residual volume is present.  11/08/13 Urine culture 40,000c/ml diphtheroids.

## 2013-12-30 NOTE — Assessment & Plan Note (Signed)
staff reported 07/01/13 the patient stated he slept for 1st time in my bed in weeks. Continue Mirtazapine 7.5mg qhs. Improved.    

## 2014-01-08 ENCOUNTER — Encounter: Payer: Self-pay | Admitting: Nurse Practitioner

## 2014-01-08 ENCOUNTER — Non-Acute Institutional Stay (SKILLED_NURSING_FACILITY): Payer: Medicare Other | Admitting: Nurse Practitioner

## 2014-01-08 DIAGNOSIS — R635 Abnormal weight gain: Secondary | ICD-10-CM

## 2014-01-08 DIAGNOSIS — K219 Gastro-esophageal reflux disease without esophagitis: Secondary | ICD-10-CM

## 2014-01-08 DIAGNOSIS — F0391 Unspecified dementia with behavioral disturbance: Secondary | ICD-10-CM | POA: Diagnosis not present

## 2014-01-08 DIAGNOSIS — F32A Depression, unspecified: Secondary | ICD-10-CM

## 2014-01-08 DIAGNOSIS — D509 Iron deficiency anemia, unspecified: Secondary | ICD-10-CM

## 2014-01-08 DIAGNOSIS — F329 Major depressive disorder, single episode, unspecified: Secondary | ICD-10-CM | POA: Diagnosis not present

## 2014-01-08 DIAGNOSIS — J329 Chronic sinusitis, unspecified: Secondary | ICD-10-CM | POA: Diagnosis not present

## 2014-01-08 DIAGNOSIS — N400 Enlarged prostate without lower urinary tract symptoms: Secondary | ICD-10-CM

## 2014-01-08 DIAGNOSIS — E876 Hypokalemia: Secondary | ICD-10-CM | POA: Diagnosis not present

## 2014-01-08 DIAGNOSIS — M5440 Lumbago with sciatica, unspecified side: Secondary | ICD-10-CM

## 2014-01-08 DIAGNOSIS — I10 Essential (primary) hypertension: Secondary | ICD-10-CM | POA: Diagnosis not present

## 2014-01-08 DIAGNOSIS — G4700 Insomnia, unspecified: Secondary | ICD-10-CM

## 2014-01-08 DIAGNOSIS — K5900 Constipation, unspecified: Secondary | ICD-10-CM

## 2014-01-08 DIAGNOSIS — F03918 Unspecified dementia, unspecified severity, with other behavioral disturbance: Secondary | ICD-10-CM

## 2014-01-08 NOTE — Assessment & Plan Note (Signed)
staff reported 07/01/13 the patient stated he slept for 1st time in my bed in weeks. Continue Mirtazapine 7.5mg qhs. Improved.    

## 2014-01-08 NOTE — Assessment & Plan Note (Signed)
09/11/13 consultant pharmacist: dc Omeprazole  12/28/13 stable. continue Zantac 75mg 

## 2014-01-08 NOTE — Assessment & Plan Note (Signed)
Resolved, K 3.8 07/02/13, takes Kcl 72meq daily.

## 2014-01-08 NOTE — Assessment & Plan Note (Signed)
gradual decline  in SNF and continue Namenda  

## 2014-01-08 NOTE — Assessment & Plan Note (Signed)
09/24/13 Iron 36, ferritin 34, TICB256, Fe Sat 14, Hgb 10.1, takes Iron 325mg  daily. IDA 10/25/13 Hgb 11.9

## 2014-01-08 NOTE — Assessment & Plan Note (Signed)
The patient sleeps and eats well since Mirtazapine started. Fasting CBGs 89, 85, 92, 89. No s/s of fluid retention. Denied chest pain, SOB, orthopnea or DOE. Continue to monitor weight.

## 2014-01-08 NOTE — Assessment & Plan Note (Signed)
Managed with Senokot S II bid and MOM q 3days-stable.    

## 2014-01-08 NOTE — Assessment & Plan Note (Signed)
Tylenol nightly and prn Tylenol 650mg q4h and Oxycodone 5mg q4h prn available to him. He stated his back pain travels and feels like pain in his stomach.  

## 2014-01-08 NOTE — Assessment & Plan Note (Signed)
C/o nasal congestion, denied facial pressure or pain, cough and mucous production occasionally-the patient attributed it to his post nasal drainage. Takes Claritin for a few weeks-no much efficacy. Will add Flonase nasal spray I spray each nostril daily. Augmentin 875mg  bid x 7 days along with Florastor.

## 2014-01-08 NOTE — Progress Notes (Signed)
Patient ID: Vincent Potts, male   DOB: 1924/03/21, 80 y.o.   MRN: [id number]   Code Status: DNR  Allergies  Allergen Reactions  . Olanzapine Other (See Comments)    unknown  . Rivastigmine Nausea Only    Chief Complaint  Patient presents with  . Medical Management of Chronic Issues  . Acute Visit    persisted sinus congestion.     HPI: Patient is a 80 y.o. male seen in the SNF at [facility] today for evaluation of weight gain, sinus congestion,  and chronic medical conditions.  Problem List Items Addressed This Visit    Weight gain    The patient sleeps and eats well since Mirtazapine started. Fasting CBGs 89, 85, 92, 89. No s/s of fluid retention. Denied chest pain, SOB, orthopnea or DOE. Continue to monitor weight.     Sinusitis, chronic - Primary    C/o nasal congestion, denied facial pressure or pain, cough and mucous production occasionally-the patient attributed it to his post nasal drainage. Takes Claritin for a few weeks-no much efficacy. Will add Flonase nasal spray I spray each nostril daily. Augmentin 875mg  bid x 7 days along with Florastor.     Insomnia    staff reported 07/01/13 the patient stated he slept for 1st time in my bed in weeks. Continue Mirtazapine 7.5mg  qhs. Improved.        Hypokalemia    Resolved, K 3.8 07/02/13, takes Kcl 57meq daily.       Hypertension    Controlled on Amlodipine 5mg        GERD (gastroesophageal reflux disease)    09/11/13 consultant pharmacist: dc Omeprazole  12/28/13 stable. continue Zantac 75mg       Depression    gradual decline  in SNF and continue Namenda      Dementia with behavioral disturbance    gradual decline  in SNF and continue Namenda      Constipation    Managed with Senokot S II bid and MOM q 3days-stable.      BPH (benign prostatic hyperplasia)    Recurrent urinary retention-Foley inserted 11/04/12 at ED-successfully removed--continued Tamsulosin and Finasteride 11/06/13 c/o urinary  frequency-UA C/S and US bladder scan.   11/07/13 US bladder: no significant postvoid residual volume is present.  11/08/13 Urine culture 40,000c/ml diphtheroids.        Back pain    Tylenol nightly and prn Tylenol 650mg  q4h and Oxycodone 5mg  q4h prn available to him. He stated his back pain travels and feels like pain in his stomach.        Anemia    09/24/13 Iron 36, ferritin 34, TICB256, Fe Sat 14, Hgb 10.1, takes Iron 325mg  daily. IDA 10/25/13 Hgb 11.9        Review of Systems  Constitutional: Negative for fever, chills, weight loss, malaise/fatigue and diaphoresis.       Weight gain.   HENT: Positive for congestion and hearing loss. Negative for ear pain and sore throat.   Eyes: Negative for blurred vision, double vision, photophobia, pain, discharge and redness.  Respiratory: Positive for cough. Negative for sputum production, shortness of breath and wheezing.   Cardiovascular: Negative for chest pain, palpitations, orthopnea, claudication, leg swelling and PND.  Gastrointestinal: Negative for heartburn, nausea, vomiting, abdominal pain, diarrhea and constipation.       The patient stated his back pain feels like pain in his stomach-Oxycodone relieves pain.   Genitourinary: Positive for frequency (much better). Negative for dysuria, urgency and flank  pain.  Musculoskeletal: Positive for back pain and joint pain. Negative for falls and neck pain.  Skin: Negative for itching and rash.       R upper arm skin tear-not infected.   Neurological: Negative for dizziness, tremors, speech change, focal weakness, seizures, loss of consciousness and headaches.  Endo/Heme/Allergies: Negative for environmental allergies and polydipsia. Does not bruise/bleed easily.  Psychiatric/Behavioral: Positive for memory loss. Negative for depression and hallucinations. The patient is not nervous/anxious and does not have insomnia.        07/01/13 staff reported the patient stated he slept for 1st time in  my bed in weeks.      Past Medical History  Diagnosis Date  . Thyroid disease   . Anemia   . Vitamin D deficiency disease   . Hyponatremia   . Anxiety   . Parkinsonism   . Hearing loss   . Hypertension   . PVC (premature ventricular contraction)   . CVA (cerebral infarction)   . GERD (gastroesophageal reflux disease)   . Diverticulosis   . Sciatica   . Back pain   . Gait disorder   . Cataract   . Edema   . Keratosis   . Sciatica   . Cystic disease of liver   . Paresthesia   . Inguinal hernia   . Hearing loss   . Stroke 2010    no deficits  . Chronic kidney disease 2013    rhabdo  . H/O hiatal hernia 2008    repaired  . Depression 06/06/2012  . Dementia with behavioral disturbance 06/06/2012   Past Surgical History  Procedure Laterality Date  . Prostate surgery      cancer  . Hernia repair    . Appendectomy    . Esophagogastroduodenoscopy  10/25/2011    Procedure: ESOPHAGOGASTRODUODENOSCOPY (EGD);  Surgeon: Winfield Cunas., MD;  Location: [facility] ENDOSCOPY;  Service: Endoscopy;  Laterality: N/A;   Social History:   reports that he has never smoked. He does not have any smokeless tobacco history on file. He reports that he drinks alcohol. He reports that he does not use illicit drugs.  Family History  Problem Relation Age of Onset  . Hypertension Mother     Medications: Patient's Medications  New Prescriptions   No medications on file  Previous Medications   ACETAMINOPHEN (TYLENOL) 325 MG TABLET    Take 650 mg by mouth every 4 (four) hours as needed.   ACETAMINOPHEN (TYLENOL) 500 MG TABLET    Take 500 mg by mouth at bedtime. To be given with Restoril   AMLODIPINE (NORVASC) 5 MG TABLET    Take 1 tablet (5 mg total) by mouth every morning.   CARBOXYMETHYLCELLULOSE (REFRESH TEARS) 0.5 % SOLN    Place 1 drop into both eyes 3 (three) times daily.   CHOLECALCIFEROL (VITAMIN D) 1000 UNITS TABLET    Take 1,000 Units by mouth every morning.    CYCLOSPORINE (RESTASIS)  0.05 % OPHTHALMIC EMULSION    Place 1 drop into both eyes 2 (two) times daily.    FEEDING SUPPLEMENT (ENSURE CLINICAL STRENGTH) LIQD    Take 237 mLs by mouth. Take one can daily as needed   FERROUS SULFATE 325 (65 FE) MG TABLET    Take 325 mg by mouth daily with breakfast.   FINASTERIDE (PROSCAR) 5 MG TABLET    Take 5 mg by mouth daily.   HYPROMELLOSE (GENTEAL OP)    Apply 1 drop to eye at bedtime.  MAGNESIUM HYDROXIDE (MILK OF MAGNESIA) 400 MG/5ML SUSPENSION    Take 30 mLs by mouth every 3 (three) days. Please take 30 ml by mouth daily prn for constipation.   MELATONIN 3 MG TABS    Take 3 mg by mouth at bedtime. Take one additional at bedtime for sleep   MEMANTINE HCL ER (NAMENDA XR) 28 MG CP24    Take 28 mg by mouth. Take one daily for memory   MIRTAZAPINE (REMERON) 7.5 MG TABLET    Take 7.5 mg by mouth at bedtime.   OXYCODONE (OXY-IR) 5 MG CAPSULE    Take 5 mg by mouth every 4 (four) hours as needed. pain   POTASSIUM CHLORIDE (K-DUR) 10 MEQ TABLET    Take 10 mEq by mouth daily.   RANITIDINE (ZANTAC) 150 MG TABLET    Take 150 mg by mouth at bedtime.   SENNA (SENOKOT) 8.6 MG TABLET    Take 2 tablets by mouth 2 (two) times daily.    SODIUM FLUORIDE (PREVIDENT 5000 PLUS) 1.1 % CREA DENTAL CREAM    Place 1 application onto teeth every evening.   TAMSULOSIN HCL (FLOMAX) 0.4 MG CAPS    Take 0.4 mg by mouth every morning.    VENLAFAXINE (EFFEXOR) 75 MG TABLET    Take 75 mg by mouth daily.  Modified Medications   No medications on file  Discontinued Medications   No medications on file     Physical Exam  Constitutional: He appears well-developed and well-nourished.  HENT:  Head: Normocephalic and atraumatic. Not macrocephalic.  Nose: Mucosal edema and rhinorrhea present. Right sinus exhibits no maxillary sinus tenderness and no frontal sinus tenderness. Left sinus exhibits no maxillary sinus tenderness and no frontal sinus tenderness.  Mouth/Throat: Uvula is midline. Posterior oropharyngeal  erythema present. No oropharyngeal exudate or posterior oropharyngeal edema.  Eyes: EOM are normal. Pupils are equal, round, and reactive to light.  Neck: Normal range of motion. Neck supple. No JVD present. No thyromegaly present.  Cardiovascular: Normal rate and regular rhythm.   Murmur heard.  Systolic murmur is present with a grade of 2/6  Pulmonary/Chest: Effort normal and breath sounds normal. He has no wheezes. He has no rales.  Abdominal: Soft. Bowel sounds are normal. There is no tenderness.  Musculoskeletal: Normal range of motion. He exhibits tenderness. He exhibits no edema.  Scoliokyphosis, walking with walker with SBA. Chronic back pain. 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  Lymphadenopathy:    He has no cervical adenopathy.  Neurological: He is alert. He displays normal reflexes. No cranial nerve deficit. He exhibits normal muscle tone. Coordination normal.  Skin: Skin is warm and dry. No rash noted. No erythema.  R upper arm skin tear-not infected.    Psychiatric: His mood appears not anxious. His affect is not angry, not blunt and not labile. His speech is not delayed and not slurred. He is not agitated, not aggressive, not slowed, not withdrawn, not actively hallucinating and not combative. Thought content is not paranoid and not delusional. Cognition and memory are impaired. He expresses impulsivity. He does not exhibit a depressed mood. He exhibits abnormal recent memory. He is attentive.    Filed Vitals:   01/08/14 1359  BP: 126/70  Pulse: 70  Temp: 97.3 F (36.3 C)  TempSrc: Tympanic  Resp: 16      Labs reviewed: Basic Metabolic Panel:  Recent Labs  06/11/13 07/02/13 07/05/13  NA 135* 142 138  K 4.4 3.8 3.9  BUN  26* 52* 27*  CREATININE 1.6* 2.1* 1.9*  TSH 1.04  --   --    Liver Function Tests:  Recent Labs  01/29/13 04/09/13  AST 13* 10*  ALT 9* 10  ALKPHOS 108 77   CBC:  Recent Labs  06/11/13 09/24/13 10/25/13  WBC 7.6 6.8 8.3    HGB 11.5* 10.1* 11.9*  HCT 34* 30* 36*  PLT 287 247 196       Assessment/Plan Sinusitis, chronic C/o nasal congestion, denied facial pressure or pain, cough and mucous production occasionally-the patient attributed it to his post nasal drainage. Takes Claritin for a few weeks-no much efficacy. Will add Flonase nasal spray I spray each nostril daily. Augmentin 875mg  bid x 7 days along with Florastor.   Weight gain The patient sleeps and eats well since Mirtazapine started. Fasting CBGs 89, 85, 92, 89. No s/s of fluid retention. Denied chest pain, SOB, orthopnea or DOE. Continue to monitor weight.   Insomnia staff reported 07/01/13 the patient stated he slept for 1st time in my bed in weeks. Continue Mirtazapine 7.5mg  qhs. Improved.      Hypokalemia Resolved, K 3.8 07/02/13, takes Kcl 46meq daily.     Hypertension Controlled on Amlodipine 5mg      GERD (gastroesophageal reflux disease) 09/11/13 consultant pharmacist: dc Omeprazole  12/28/13 stable. continue Zantac 75mg     Depression gradual decline  in SNF and continue Namenda    Dementia with behavioral disturbance gradual decline  in SNF and continue Namenda    Constipation Managed with Senokot S II bid and MOM q 3days-stable.    Anemia 09/24/13 Iron 36, ferritin 34, TICB256, Fe Sat 14, Hgb 10.1, takes Iron 325mg  daily. IDA 10/25/13 Hgb 11.9   Back pain Tylenol nightly and prn Tylenol 650mg  q4h and Oxycodone 5mg  q4h prn available to him. He stated his back pain travels and feels like pain in his stomach.      BPH (benign prostatic hyperplasia) Recurrent urinary retention-Foley inserted 11/04/12 at ED-successfully removed--continued Tamsulosin and Finasteride 11/06/13 c/o urinary frequency-UA C/S and US bladder scan.   11/07/13 US bladder: no significant postvoid residual volume is present.  11/08/13 Urine culture 40,000c/ml diphtheroids.        Family/ Staff Communication: observe the patient.   Goals  of Care: SNF  Labs/tests ordered: fasting CBGs

## 2014-01-08 NOTE — Assessment & Plan Note (Signed)
Recurrent urinary retention-Foley inserted 11/04/12 at ED-successfully removed--continued Tamsulosin and Finasteride 11/06/13 c/o urinary frequency-UA C/S and US bladder scan.   11/07/13 US bladder: no significant postvoid residual volume is present.  11/08/13 Urine culture 40,000c/ml diphtheroids.

## 2014-01-08 NOTE — Assessment & Plan Note (Signed)
Controlled on Amlodipine 5mg   

## 2014-02-08 ENCOUNTER — Encounter: Payer: Self-pay | Admitting: Nurse Practitioner

## 2014-02-08 ENCOUNTER — Non-Acute Institutional Stay (SKILLED_NURSING_FACILITY): Payer: Medicare Other | Admitting: Nurse Practitioner

## 2014-02-08 DIAGNOSIS — K5900 Constipation, unspecified: Secondary | ICD-10-CM

## 2014-02-08 DIAGNOSIS — I10 Essential (primary) hypertension: Secondary | ICD-10-CM | POA: Diagnosis not present

## 2014-02-08 DIAGNOSIS — F329 Major depressive disorder, single episode, unspecified: Secondary | ICD-10-CM

## 2014-02-08 DIAGNOSIS — J329 Chronic sinusitis, unspecified: Secondary | ICD-10-CM

## 2014-02-08 DIAGNOSIS — E876 Hypokalemia: Secondary | ICD-10-CM

## 2014-02-08 DIAGNOSIS — F0391 Unspecified dementia with behavioral disturbance: Secondary | ICD-10-CM | POA: Diagnosis not present

## 2014-02-08 DIAGNOSIS — M5440 Lumbago with sciatica, unspecified side: Secondary | ICD-10-CM

## 2014-02-08 DIAGNOSIS — K219 Gastro-esophageal reflux disease without esophagitis: Secondary | ICD-10-CM | POA: Diagnosis not present

## 2014-02-08 DIAGNOSIS — F32A Depression, unspecified: Secondary | ICD-10-CM

## 2014-02-08 DIAGNOSIS — G4700 Insomnia, unspecified: Secondary | ICD-10-CM | POA: Diagnosis not present

## 2014-02-08 DIAGNOSIS — D500 Iron deficiency anemia secondary to blood loss (chronic): Secondary | ICD-10-CM

## 2014-02-08 DIAGNOSIS — R635 Abnormal weight gain: Secondary | ICD-10-CM

## 2014-02-08 DIAGNOSIS — F03918 Unspecified dementia, unspecified severity, with other behavioral disturbance: Secondary | ICD-10-CM

## 2014-02-08 DIAGNOSIS — N400 Enlarged prostate without lower urinary tract symptoms: Secondary | ICD-10-CM

## 2014-02-08 NOTE — Progress Notes (Signed)
Patient ID: Vincent Potts, male   DOB: August 31, 1924, 80 y.o.   MRN: [id number]   Code Status: DNR  Allergies  Allergen Reactions  . Olanzapine Other (See Comments)    unknown  . Rivastigmine Nausea Only    Chief Complaint  Patient presents with  . Medical Management of Chronic Issues    HPI: Patient is a 80 y.o. male seen in the SNF at [facility] today for evaluation of chronic medical conditions.  Problem List Items Addressed This Visit    Weight gain    The patient sleeps and eats well since Mirtazapine started. Fasting CBGs 89, 85, 92, 89. No s/s of fluid retention. Denied chest pain, SOB, orthopnea or DOE. Continue to monitor weight.      Sinusitis, chronic    C/o nasal congestion, denied facial pressure or pain, cough and mucous production occasionally-the patient attributed it to his post nasal drainage. Takes Claritin for a few weeks-no much efficacy. Will add Flonase nasal spray I spray each nostril daily. Augmentin 875mg  bid x 7 days along with Florastor.  02/08/14 improved.      Insomnia    staff reported 07/01/13 the patient stated he slept for 1st time in my bed in weeks. Continue Mirtazapine 7.5mg  qhs. Improved.         Hypokalemia - Primary    Resolved, K 3.8 07/02/13, takes Kcl 22meq daily. Update CMP      Hypertension    Controlled on Amlodipine 5mg       GERD (gastroesophageal reflux disease)    09/11/13 consultant pharmacist: dc Omeprazole  12/28/13 stable. continue Zantac 75mg  02/08/14 stable.        Depression    better on Effexor 75mg  and Mirtazapine 7.5mg  qhs. Sleeps well at night.     Dementia with behavioral disturbance    gradual decline  in SNF and continue Namenda      Constipation    Managed with Senokot S II bid and MOM q 3days-stable.       BPH (benign prostatic hyperplasia)    Recurrent urinary retention-Foley inserted 11/04/12 at ED-successfully removed--continued Tamsulosin and Finasteride 11/06/13 c/o urinary frequency-UA  C/S and US bladder scan.   11/07/13 US bladder: no significant postvoid residual volume is present.  11/08/13 Urine culture 40,000c/ml diphtheroids.   02/08/14 no urinary retention      Back pain    Tylenol nightly and prn Tylenol 650mg  q4h and Oxycodone 5mg  q4h prn available to him. He stated his back pain travels and feels like pain in his stomach.       Anemia    09/24/13 Iron 36, ferritin 34, TICB256, Fe Sat 14, Hgb 10.1, takes Iron 325mg  daily. IDA 10/25/13 Hgb 11.9 02/08/14 update CBC and Fe         Review of Systems  Constitutional: Negative for fever, chills, weight loss, malaise/fatigue and diaphoresis.       Weight gain.   HENT: Positive for hearing loss. Negative for congestion, ear pain and sore throat.   Eyes: Negative for blurred vision, double vision, photophobia, pain, discharge and redness.  Respiratory: Positive for cough. Negative for sputum production, shortness of breath and wheezing.   Cardiovascular: Negative for chest pain, palpitations, orthopnea, claudication, leg swelling and PND.  Gastrointestinal: Negative for heartburn, nausea, vomiting, abdominal pain, diarrhea and constipation.       The patient stated his back pain feels like pain in his stomach-Oxycodone relieves pain.   Genitourinary: Positive for frequency (much better). Negative  for dysuria, urgency and flank pain.  Musculoskeletal: Positive for back pain and joint pain. Negative for falls and neck pain.  Skin: Negative for itching and rash.  Neurological: Negative for dizziness, tremors, speech change, focal weakness, seizures, loss of consciousness and headaches.  Endo/Heme/Allergies: Negative for environmental allergies and polydipsia. Does not bruise/bleed easily.  Psychiatric/Behavioral: Positive for memory loss. Negative for depression and hallucinations. The patient is not nervous/anxious and does not have insomnia.        07/01/13 staff reported the patient stated he slept for 1st time in  my bed in weeks.      Past Medical History  Diagnosis Date  . Thyroid disease   . Anemia   . Vitamin D deficiency disease   . Hyponatremia   . Anxiety   . Parkinsonism   . Hearing loss   . Hypertension   . PVC (premature ventricular contraction)   . CVA (cerebral infarction)   . GERD (gastroesophageal reflux disease)   . Diverticulosis   . Sciatica   . Back pain   . Gait disorder   . Cataract   . Edema   . Keratosis   . Sciatica   . Cystic disease of liver   . Paresthesia   . Inguinal hernia   . Hearing loss   . Stroke 2010    no deficits  . Chronic kidney disease 2013    rhabdo  . H/O hiatal hernia 2008    repaired  . Depression 06/06/2012  . Dementia with behavioral disturbance 06/06/2012   Past Surgical History  Procedure Laterality Date  . Prostate surgery      cancer  . Hernia repair    . Appendectomy    . Esophagogastroduodenoscopy  10/25/2011    Procedure: ESOPHAGOGASTRODUODENOSCOPY (EGD);  Surgeon: Winfield Cunas., MD;  Location: [facility] ENDOSCOPY;  Service: Endoscopy;  Laterality: N/A;   Social History:   reports that he has never smoked. He does not have any smokeless tobacco history on file. He reports that he drinks alcohol. He reports that he does not use illicit drugs.  Family History  Problem Relation Age of Onset  . Hypertension Mother     Medications: Patient's Medications  New Prescriptions   No medications on file  Previous Medications   ACETAMINOPHEN (TYLENOL) 325 MG TABLET    Take 650 mg by mouth every 4 (four) hours as needed.   ACETAMINOPHEN (TYLENOL) 500 MG TABLET    Take 500 mg by mouth at bedtime. To be given with Restoril   AMLODIPINE (NORVASC) 5 MG TABLET    Take 1 tablet (5 mg total) by mouth every morning.   CARBOXYMETHYLCELLULOSE (REFRESH TEARS) 0.5 % SOLN    Place 1 drop into both eyes 3 (three) times daily.   CHOLECALCIFEROL (VITAMIN D) 1000 UNITS TABLET    Take 1,000 Units by mouth every morning.    CYCLOSPORINE (RESTASIS)  0.05 % OPHTHALMIC EMULSION    Place 1 drop into both eyes 2 (two) times daily.    FEEDING SUPPLEMENT (ENSURE CLINICAL STRENGTH) LIQD    Take 237 mLs by mouth. Take one can daily as needed   FERROUS SULFATE 325 (65 FE) MG TABLET    Take 325 mg by mouth daily with breakfast.   FINASTERIDE (PROSCAR) 5 MG TABLET    Take 5 mg by mouth daily.   HYPROMELLOSE (GENTEAL OP)    Apply 1 drop to eye at bedtime.   MAGNESIUM HYDROXIDE (MILK OF MAGNESIA) 400 MG/5ML SUSPENSION  Take 30 mLs by mouth every 3 (three) days. Please take 30 ml by mouth daily prn for constipation.   MELATONIN 3 MG TABS    Take 3 mg by mouth at bedtime. Take one additional at bedtime for sleep   MEMANTINE HCL ER (NAMENDA XR) 28 MG CP24    Take 28 mg by mouth. Take one daily for memory   MIRTAZAPINE (REMERON) 7.5 MG TABLET    Take 7.5 mg by mouth at bedtime.   OXYCODONE (OXY-IR) 5 MG CAPSULE    Take 5 mg by mouth every 4 (four) hours as needed. pain   POTASSIUM CHLORIDE (K-DUR) 10 MEQ TABLET    Take 10 mEq by mouth daily.   RANITIDINE (ZANTAC) 150 MG TABLET    Take 150 mg by mouth at bedtime.   SENNA (SENOKOT) 8.6 MG TABLET    Take 2 tablets by mouth 2 (two) times daily.    SODIUM FLUORIDE (PREVIDENT 5000 PLUS) 1.1 % CREA DENTAL CREAM    Place 1 application onto teeth every evening.   TAMSULOSIN HCL (FLOMAX) 0.4 MG CAPS    Take 0.4 mg by mouth every morning.    VENLAFAXINE (EFFEXOR) 75 MG TABLET    Take 75 mg by mouth daily.  Modified Medications   No medications on file  Discontinued Medications   No medications on file     Physical Exam  Constitutional: He appears well-developed and well-nourished.  HENT:  Head: Normocephalic and atraumatic. Not macrocephalic.  Nose: Mucosal edema and rhinorrhea present. Right sinus exhibits no maxillary sinus tenderness and no frontal sinus tenderness. Left sinus exhibits no maxillary sinus tenderness and no frontal sinus tenderness.  Mouth/Throat: Uvula is midline. Posterior oropharyngeal  erythema present. No oropharyngeal exudate or posterior oropharyngeal edema.  Eyes: EOM are normal. Pupils are equal, round, and reactive to light.  Neck: Normal range of motion. Neck supple. No JVD present. No thyromegaly present.  Cardiovascular: Normal rate and regular rhythm.   Murmur heard.  Systolic murmur is present with a grade of 2/6  Pulmonary/Chest: Effort normal and breath sounds normal. He has no wheezes. He has no rales.  Abdominal: Soft. Bowel sounds are normal. There is no tenderness.  Musculoskeletal: Normal range of motion. He exhibits tenderness. He exhibits no edema.  Scoliokyphosis, walking with walker with SBA. Chronic back pain. 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  Lymphadenopathy:    He has no cervical adenopathy.  Neurological: He is alert. He displays normal reflexes. No cranial nerve deficit. He exhibits normal muscle tone. Coordination normal.  Skin: Skin is warm and dry. No rash noted. No erythema.  R upper arm skin tear-not infected.    Psychiatric: His mood appears not anxious. His affect is not angry, not blunt and not labile. His speech is not delayed and not slurred. He is not agitated, not aggressive, not slowed, not withdrawn, not actively hallucinating and not combative. Thought content is not paranoid and not delusional. Cognition and memory are impaired. He expresses impulsivity. He does not exhibit a depressed mood. He exhibits abnormal recent memory. He is attentive.    Filed Vitals:   02/08/14 1319  BP: 146/96  Pulse: 88  Temp: 98.2 F (36.8 C)  TempSrc: Tympanic  Resp: 20      Labs reviewed: Basic Metabolic Panel:  Recent Labs  06/11/13 07/02/13 07/05/13  NA 135* 142 138  K 4.4 3.8 3.9  BUN 26* 52* 27*  CREATININE 1.6* 2.1* 1.9*  TSH 1.04  --   --  Liver Function Tests:  Recent Labs  04/09/13  AST 10*  ALT 10  ALKPHOS 77   CBC:  Recent Labs  06/11/13 09/24/13 10/25/13  WBC 7.6 6.8 8.3  HGB 11.5* 10.1*  11.9*  HCT 34* 30* 36*  PLT 287 247 196       Assessment/Plan Weight gain The patient sleeps and eats well since Mirtazapine started. Fasting CBGs 89, 85, 92, 89. No s/s of fluid retention. Denied chest pain, SOB, orthopnea or DOE. Continue to monitor weight.    Sinusitis, chronic C/o nasal congestion, denied facial pressure or pain, cough and mucous production occasionally-the patient attributed it to his post nasal drainage. Takes Claritin for a few weeks-no much efficacy. Will add Flonase nasal spray I spray each nostril daily. Augmentin 875mg  bid x 7 days along with Florastor.  02/08/14 improved.    Insomnia staff reported 07/01/13 the patient stated he slept for 1st time in my bed in weeks. Continue Mirtazapine 7.5mg  qhs. Improved.       Hypokalemia Resolved, K 3.8 07/02/13, takes Kcl 6meq daily. Update CMP    Hypertension Controlled on Amlodipine 5mg     GERD (gastroesophageal reflux disease) 09/11/13 consultant pharmacist: dc Omeprazole  12/28/13 stable. continue Zantac 75mg  02/08/14 stable.      Depression better on Effexor 75mg  and Mirtazapine 7.5mg  qhs. Sleeps well at night.   Dementia with behavioral disturbance gradual decline  in SNF and continue Namenda    Constipation Managed with Senokot S II bid and MOM q 3days-stable.     BPH (benign prostatic hyperplasia) Recurrent urinary retention-Foley inserted 11/04/12 at ED-successfully removed--continued Tamsulosin and Finasteride 11/06/13 c/o urinary frequency-UA C/S and US bladder scan.   11/07/13 US bladder: no significant postvoid residual volume is present.  11/08/13 Urine culture 40,000c/ml diphtheroids.   02/08/14 no urinary retention    Back pain Tylenol nightly and prn Tylenol 650mg  q4h and Oxycodone 5mg  q4h prn available to him. He stated his back pain travels and feels like pain in his stomach.     Anemia 09/24/13 Iron 36, ferritin 34, TICB256, Fe Sat 14, Hgb 10.1, takes Iron 325mg  daily.  IDA 10/25/13 Hgb 11.9 02/08/14 update CBC and Fe      Family/ Staff Communication: observe the patient.   Goals of Care: SNF  Labs/tests ordered: CBC and CMP

## 2014-02-08 NOTE — Assessment & Plan Note (Signed)
gradual decline  in SNF and continue Namenda  

## 2014-02-08 NOTE — Assessment & Plan Note (Signed)
Managed with Senokot S II bid and MOM q 3days-stable.    

## 2014-02-08 NOTE — Assessment & Plan Note (Signed)
Controlled on Amlodipine 5mg   

## 2014-02-08 NOTE — Assessment & Plan Note (Signed)
Resolved, K 3.8 07/02/13, takes Kcl 69meq daily. Update CMP

## 2014-02-08 NOTE — Assessment & Plan Note (Signed)
Tylenol nightly and prn Tylenol 650mg q4h and Oxycodone 5mg q4h prn available to him. He stated his back pain travels and feels like pain in his stomach.  

## 2014-02-08 NOTE — Assessment & Plan Note (Signed)
staff reported 07/01/13 the patient stated he slept for 1st time in my bed in weeks. Continue Mirtazapine 7.5mg qhs. Improved.    

## 2014-02-08 NOTE — Assessment & Plan Note (Signed)
The patient sleeps and eats well since Mirtazapine started. Fasting CBGs 89, 85, 92, 89. No s/s of fluid retention. Denied chest pain, SOB, orthopnea or DOE. Continue to monitor weight.

## 2014-02-08 NOTE — Assessment & Plan Note (Signed)
09/24/13 Iron 36, ferritin 34, TICB256, Fe Sat 14, Hgb 10.1, takes Iron 325mg  daily. IDA 10/25/13 Hgb 11.9 02/08/14 update CBC and Fe

## 2014-02-08 NOTE — Assessment & Plan Note (Signed)
09/11/13 consultant pharmacist: dc Omeprazole  12/28/13 stable. continue Zantac 75mg  02/08/14 stable.

## 2014-02-08 NOTE — Assessment & Plan Note (Signed)
C/o nasal congestion, denied facial pressure or pain, cough and mucous production occasionally-the patient attributed it to his post nasal drainage. Takes Claritin for a few weeks-no much efficacy. Will add Flonase nasal spray I spray each nostril daily. Augmentin 875mg  bid x 7 days along with Florastor.  02/08/14 improved.

## 2014-02-08 NOTE — Assessment & Plan Note (Signed)
Recurrent urinary retention-Foley inserted 11/04/12 at ED-successfully removed--continued Tamsulosin and Finasteride 11/06/13 c/o urinary frequency-UA C/S and US bladder scan.   11/07/13 US bladder: no significant postvoid residual volume is present.  11/08/13 Urine culture 40,000c/ml diphtheroids.   02/08/14 no urinary retention

## 2014-02-08 NOTE — Assessment & Plan Note (Signed)
better on Effexor 75mg and Mirtazapine 7.5mg qhs. Sleeps well at night   

## 2014-02-11 DIAGNOSIS — D649 Anemia, unspecified: Secondary | ICD-10-CM | POA: Diagnosis not present

## 2014-02-11 DIAGNOSIS — E876 Hypokalemia: Secondary | ICD-10-CM | POA: Diagnosis not present

## 2014-02-11 LAB — BASIC METABOLIC PANEL
BUN: 32 mg/dL — AB (ref 4–21)
CREATININE: 2 mg/dL — AB (ref 0.6–1.3)
GLUCOSE: 71 mg/dL
Potassium: 4.3 mmol/L (ref 3.4–5.3)
SODIUM: 135 mmol/L — AB (ref 137–147)

## 2014-02-11 LAB — CBC AND DIFFERENTIAL
HCT: 32 % — AB (ref 41–53)
Hemoglobin: 10.7 g/dL — AB (ref 13.5–17.5)
Platelets: 208 10*3/uL (ref 150–399)
WBC: 6.2 10^3/mL

## 2014-02-11 LAB — HEPATIC FUNCTION PANEL
ALK PHOS: 87 U/L (ref 25–125)
ALT: 8 U/L — AB (ref 10–40)
AST: 12 U/L — AB (ref 14–40)
Bilirubin, Total: 0.4 mg/dL

## 2014-02-12 ENCOUNTER — Other Ambulatory Visit: Payer: Self-pay | Admitting: Nurse Practitioner

## 2014-02-12 DIAGNOSIS — D509 Iron deficiency anemia, unspecified: Secondary | ICD-10-CM

## 2014-02-12 DIAGNOSIS — N183 Chronic kidney disease, stage 3 unspecified: Secondary | ICD-10-CM

## 2014-02-26 ENCOUNTER — Encounter: Payer: Self-pay | Admitting: Nurse Practitioner

## 2014-02-26 ENCOUNTER — Non-Acute Institutional Stay (SKILLED_NURSING_FACILITY): Payer: Medicare Other | Admitting: Nurse Practitioner

## 2014-02-26 DIAGNOSIS — D509 Iron deficiency anemia, unspecified: Secondary | ICD-10-CM | POA: Diagnosis not present

## 2014-02-26 DIAGNOSIS — N400 Enlarged prostate without lower urinary tract symptoms: Secondary | ICD-10-CM

## 2014-02-26 DIAGNOSIS — F0391 Unspecified dementia with behavioral disturbance: Secondary | ICD-10-CM | POA: Diagnosis not present

## 2014-02-26 DIAGNOSIS — H00019 Hordeolum externum unspecified eye, unspecified eyelid: Secondary | ICD-10-CM | POA: Insufficient documentation

## 2014-02-26 DIAGNOSIS — K219 Gastro-esophageal reflux disease without esophagitis: Secondary | ICD-10-CM

## 2014-02-26 DIAGNOSIS — K5900 Constipation, unspecified: Secondary | ICD-10-CM | POA: Diagnosis not present

## 2014-02-26 DIAGNOSIS — G4700 Insomnia, unspecified: Secondary | ICD-10-CM

## 2014-02-26 DIAGNOSIS — E876 Hypokalemia: Secondary | ICD-10-CM

## 2014-02-26 DIAGNOSIS — N183 Chronic kidney disease, stage 3 unspecified: Secondary | ICD-10-CM

## 2014-02-26 DIAGNOSIS — I10 Essential (primary) hypertension: Secondary | ICD-10-CM

## 2014-02-26 DIAGNOSIS — F03918 Unspecified dementia, unspecified severity, with other behavioral disturbance: Secondary | ICD-10-CM

## 2014-02-26 DIAGNOSIS — F329 Major depressive disorder, single episode, unspecified: Secondary | ICD-10-CM

## 2014-02-26 DIAGNOSIS — M5440 Lumbago with sciatica, unspecified side: Secondary | ICD-10-CM

## 2014-02-26 DIAGNOSIS — H00013 Hordeolum externum right eye, unspecified eyelid: Secondary | ICD-10-CM

## 2014-02-26 DIAGNOSIS — F32A Depression, unspecified: Secondary | ICD-10-CM

## 2014-02-26 NOTE — Assessment & Plan Note (Signed)
Managed with Senokot S II bid and MOM q 3days-stable.

## 2014-02-26 NOTE — Assessment & Plan Note (Signed)
Controlled on Amlodipine 5mg 

## 2014-02-26 NOTE — Assessment & Plan Note (Signed)
Creatinine 1.88 07/05/13(baseline 1.5-2.0)

## 2014-02-26 NOTE — Assessment & Plan Note (Signed)
Recurrent urinary retention-Foley inserted 11/04/12 at ED-successfully removed--continued Tamsulosin and Finasteride 11/06/13 c/o urinary frequency-UA C/S and US bladder scan.   11/07/13 US bladder: no significant postvoid residual volume is present.  11/08/13 Urine culture 40,000c/ml diphtheroids.   02/08/14 no urinary retention  02/26/14 no change.

## 2014-02-26 NOTE — Assessment & Plan Note (Signed)
09/11/13 consultant pharmacist: dc Omeprazole  12/28/13 stable. continue Zantac 75mg  02/08/14 stable.  02/26/14 no change

## 2014-02-26 NOTE — Assessment & Plan Note (Signed)
Tylenol nightly and prn Tylenol 650mg  q4h and Oxycodone 5mg  q4h prn available to him. He stated his back pain travels and feels like pain in his stomach.

## 2014-02-26 NOTE — Assessment & Plan Note (Signed)
staff reported 07/01/13 the patient stated he slept for 1st time in my bed in weeks. Continue Mirtazapine 7.5mg  qhs. Improved.

## 2014-02-26 NOTE — Assessment & Plan Note (Signed)
gradual decline  in SNF and continue Namenda

## 2014-02-26 NOTE — Progress Notes (Signed)
Patient ID: Vincent Potts, male   DOB: [birth year], 80 y.o.   MRN: [id number]   Code Status: DNR  Allergies  Allergen Reactions  . Olanzapine Other (See Comments)    unknown  . Rivastigmine Nausea Only    Chief Complaint  Patient presents with  . Medical Management of Chronic Issues  . Acute Visit    right lower eyelid stye    HPI: Patient is a 80 y.o. male seen in the SNF at [facility] today for evaluation of right lower eyelid stye and chronic medical condition Problem List Items Addressed This Visit    Stye - Primary    The lateral right lower eyelid-warm compress and Levofloxacin Ophthalmic Sol 0.5% 1-2gtt OD q2h x2 days then q4h x 5 days.    Insomnia    staff reported 07/01/13 the patient stated he slept for 1st time in my bed in weeks. Continue Mirtazapine 7.5mg  qhs. Improved.     Hypokalemia    Resolved, takes Kcl 7meq daily.    Hypertension    Controlled on Amlodipine 5mg        GERD (gastroesophageal reflux disease)    09/11/13 consultant pharmacist: dc Omeprazole  12/28/13 stable. continue Zantac 75mg  02/08/14 stable.  02/26/14 no change     Depression    better on Effexor 75mg  and Mirtazapine 7.5mg  qhs. Sleeps well at night    Dementia with behavioral disturbance    gradual decline  in SNF and continue Namenda     Constipation    Managed with Senokot S II bid and MOM q 3days-stable.     Chronic kidney disease    Creatinine 1.88 07/05/13(baseline 1.5-2.0)      BPH (benign prostatic hyperplasia)    Recurrent urinary retention-Foley inserted 11/04/12 at ED-successfully removed--continued Tamsulosin and Finasteride 11/06/13 c/o urinary frequency-UA C/S and US bladder scan.   11/07/13 US bladder: no significant postvoid residual volume is present.  11/08/13 Urine culture 40,000c/ml diphtheroids.   02/08/14 no urinary retention  02/26/14 no change.       Back pain    Tylenol nightly and prn Tylenol 650mg  q4h and Oxycodone 5mg  q4h prn available to  him. He stated his back pain travels and feels like pain in his stomach.    Anemia    09/24/13 Iron 36, ferritin 34, TICB256, Fe Sat 14, Hgb 10.1, takes Iron 325mg  daily. IDA 10/25/13 Hgb 11.9 02/11/14 Hgb 10.7, Iron 40-continue Fe 02/26/14 continue to observe.       Review of Systems  Constitutional: Negative for fever, chills, weight loss, malaise/fatigue and diaphoresis.       Weight gain.   HENT: Positive for hearing loss. Negative for congestion, ear pain and sore throat.        Right lower eyelid stye  Eyes: Negative for blurred vision, double vision, photophobia, pain, discharge and redness.  Respiratory: Positive for cough. Negative for sputum production, shortness of breath and wheezing.   Cardiovascular: Negative for chest pain, palpitations, orthopnea, claudication, leg swelling and PND.  Gastrointestinal: Negative for heartburn, nausea, vomiting, abdominal pain, diarrhea and constipation.       The patient stated his back pain feels like pain in his stomach-Oxycodone relieves pain.   Genitourinary: Positive for frequency (much better). Negative for dysuria, urgency and flank pain.  Musculoskeletal: Positive for back pain and joint pain. Negative for falls and neck pain.  Skin: Negative for itching and rash.  Neurological: Negative for dizziness, tremors, speech change, focal weakness, seizures, loss of consciousness  and headaches.  Endo/Heme/Allergies: Negative for environmental allergies and polydipsia. Does not bruise/bleed easily.  Psychiatric/Behavioral: Positive for memory loss. Negative for depression and hallucinations. The patient is not nervous/anxious and does not have insomnia.        07/01/13 staff reported the patient stated he slept for 1st time in my bed in weeks.      Past Medical History  Diagnosis Date  . Thyroid disease   . Anemia   . Vitamin D deficiency disease   . Hyponatremia   . Anxiety   . Parkinsonism   . Hearing loss   . Hypertension   . PVC  (premature ventricular contraction)   . CVA (cerebral infarction)   . GERD (gastroesophageal reflux disease)   . Diverticulosis   . Sciatica   . Back pain   . Gait disorder   . Cataract   . Edema   . Keratosis   . Sciatica   . Cystic disease of liver   . Paresthesia   . Inguinal hernia   . Hearing loss   . Stroke 2010    no deficits  . Chronic kidney disease 2013    rhabdo  . H/O hiatal hernia 2008    repaired  . Depression 06/06/2012  . Dementia with behavioral disturbance 06/06/2012   Past Surgical History  Procedure Laterality Date  . Prostate surgery      cancer  . Hernia repair    . Appendectomy    . Esophagogastroduodenoscopy  10/25/2011    Procedure: ESOPHAGOGASTRODUODENOSCOPY (EGD);  Surgeon: Winfield Cunas., MD;  Location: [facility] ENDOSCOPY;  Service: Endoscopy;  Laterality: N/A;   Social History:   reports that he has never smoked. He does not have any smokeless tobacco history on file. He reports that he drinks alcohol. He reports that he does not use illicit drugs.  Family History  Problem Relation Age of Onset  . Hypertension Mother     Medications: Patient's Medications  New Prescriptions   No medications on file  Previous Medications   ACETAMINOPHEN (TYLENOL) 325 MG TABLET    Take 650 mg by mouth every 4 (four) hours as needed.   ACETAMINOPHEN (TYLENOL) 500 MG TABLET    Take 500 mg by mouth at bedtime. To be given with Restoril   AMLODIPINE (NORVASC) 5 MG TABLET    Take 1 tablet (5 mg total) by mouth every morning.   CARBOXYMETHYLCELLULOSE (REFRESH TEARS) 0.5 % SOLN    Place 1 drop into both eyes 3 (three) times daily.   CHOLECALCIFEROL (VITAMIN D) 1000 UNITS TABLET    Take 1,000 Units by mouth every morning.    CYCLOSPORINE (RESTASIS) 0.05 % OPHTHALMIC EMULSION    Place 1 drop into both eyes 2 (two) times daily.    FEEDING SUPPLEMENT (ENSURE CLINICAL STRENGTH) LIQD    Take 237 mLs by mouth. Take one can daily as needed   FERROUS SULFATE 325 (65 FE) MG  TABLET    Take 325 mg by mouth daily with breakfast.   FINASTERIDE (PROSCAR) 5 MG TABLET    Take 5 mg by mouth daily.   HYPROMELLOSE (GENTEAL OP)    Apply 1 drop to eye at bedtime.   MAGNESIUM HYDROXIDE (MILK OF MAGNESIA) 400 MG/5ML SUSPENSION    Take 30 mLs by mouth every 3 (three) days. Please take 30 ml by mouth daily prn for constipation.   MELATONIN 3 MG TABS    Take 3 mg by mouth at bedtime. Take one additional at  bedtime for sleep   MEMANTINE HCL ER (NAMENDA XR) 28 MG CP24    Take 28 mg by mouth. Take one daily for memory   MIRTAZAPINE (REMERON) 7.5 MG TABLET    Take 7.5 mg by mouth at bedtime.   OXYCODONE (OXY-IR) 5 MG CAPSULE    Take 5 mg by mouth every 4 (four) hours as needed. pain   POTASSIUM CHLORIDE (K-DUR) 10 MEQ TABLET    Take 10 mEq by mouth daily.   RANITIDINE (ZANTAC) 150 MG TABLET    Take 150 mg by mouth at bedtime.   SENNA (SENOKOT) 8.6 MG TABLET    Take 2 tablets by mouth 2 (two) times daily.    SODIUM FLUORIDE (PREVIDENT 5000 PLUS) 1.1 % CREA DENTAL CREAM    Place 1 application onto teeth every evening.   TAMSULOSIN HCL (FLOMAX) 0.4 MG CAPS    Take 0.4 mg by mouth every morning.    VENLAFAXINE (EFFEXOR) 75 MG TABLET    Take 75 mg by mouth daily.  Modified Medications   No medications on file  Discontinued Medications   No medications on file    Physical Exam  Constitutional: He appears well-developed and well-nourished.  HENT:  Head: Normocephalic and atraumatic. Not macrocephalic.  Nose: Mucosal edema and rhinorrhea present. Right sinus exhibits no maxillary sinus tenderness and no frontal sinus tenderness. Left sinus exhibits no maxillary sinus tenderness and no frontal sinus tenderness.  Mouth/Throat: Uvula is midline. Posterior oropharyngeal erythema present. No oropharyngeal exudate or posterior oropharyngeal edema.  Eyes: EOM are normal. Pupils are equal, round, and reactive to light.  Lateral right lower eyelid stye  Neck: Normal range of motion. Neck supple.  No JVD present. No thyromegaly present.  Cardiovascular: Normal rate and regular rhythm.   Murmur heard.  Systolic murmur is present with a grade of 2/6  Pulmonary/Chest: Effort normal and breath sounds normal. He has no wheezes. He has no rales.  Abdominal: Soft. Bowel sounds are normal. There is no tenderness.  Musculoskeletal: Normal range of motion. He exhibits tenderness. He exhibits no edema.  Scoliokyphosis, walking with walker with SBA. Chronic back pain. 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  Lymphadenopathy:    He has no cervical adenopathy.  Neurological: He is alert. He displays normal reflexes. No cranial nerve deficit. He exhibits normal muscle tone. Coordination normal.  Skin: Skin is warm and dry. No rash noted. No erythema.  R upper arm skin tear-not infected.    Psychiatric: His mood appears not anxious. His affect is not angry, not blunt and not labile. His speech is not delayed and not slurred. He is not agitated, not aggressive, not slowed, not withdrawn, not actively hallucinating and not combative. Thought content is not paranoid and not delusional. Cognition and memory are impaired. He expresses impulsivity. He does not exhibit a depressed mood. He exhibits abnormal recent memory. He is attentive.      Filed Vitals:   02/26/14 1128  BP: 160/97  Pulse: 76  Temp: 98.6 F (37 C)  TempSrc: Tympanic  Resp: 16      Labs reviewed: Basic Metabolic Panel:  Recent Labs  06/11/13 07/02/13 07/05/13 02/11/14  NA 135* 142 138 135*  K 4.4 3.8 3.9 4.3  BUN 26* 52* 27* 32*  CREATININE 1.6* 2.1* 1.9* 2.0*  TSH 1.04  --   --   --    Liver Function Tests:  Recent Labs  04/09/13 02/11/14  AST 10* 12*  ALT 10  8*  ALKPHOS 77 87   CBC:  Recent Labs  09/24/13 10/25/13 02/11/14  WBC 6.8 8.3 6.2  HGB 10.1* 11.9* 10.7*  HCT 30* 36* 32*  PLT 247 196 208       Assessment/Plan Stye The lateral right lower eyelid-warm compress and Levofloxacin  Ophthalmic Sol 0.5% 1-2gtt OD q2h x2 days then q4h x 5 days.  Anemia 09/24/13 Iron 36, ferritin 34, TICB256, Fe Sat 14, Hgb 10.1, takes Iron 325mg  daily. IDA 10/25/13 Hgb 11.9 02/11/14 Hgb 10.7, Iron 40-continue Fe 02/26/14 continue to observe.   Back pain Tylenol nightly and prn Tylenol 650mg  q4h and Oxycodone 5mg  q4h prn available to him. He stated his back pain travels and feels like pain in his stomach.  BPH (benign prostatic hyperplasia) Recurrent urinary retention-Foley inserted 11/04/12 at ED-successfully removed--continued Tamsulosin and Finasteride 11/06/13 c/o urinary frequency-UA C/S and US bladder scan.   11/07/13 US bladder: no significant postvoid residual volume is present.  11/08/13 Urine culture 40,000c/ml diphtheroids.   02/08/14 no urinary retention  02/26/14 no change.     Chronic kidney disease Creatinine 1.88 07/05/13(baseline 1.5-2.0)    Constipation Managed with Senokot S II bid and MOM q 3days-stable.   Dementia with behavioral disturbance gradual decline  in SNF and continue Namenda   Depression better on Effexor 75mg  and Mirtazapine 7.5mg  qhs. Sleeps well at night  GERD (gastroesophageal reflux disease) 09/11/13 consultant pharmacist: dc Omeprazole  12/28/13 stable. continue Zantac 75mg  02/08/14 stable.  02/26/14 no change   Hypertension Controlled on Amlodipine 5mg      Hypokalemia Resolved, takes Kcl 87meq daily.  Insomnia staff reported 07/01/13 the patient stated he slept for 1st time in my bed in weeks. Continue Mirtazapine 7.5mg  qhs. Improved.     Family/ Staff Communication: observe the patient.   Goals of Care: SNF  Labs/tests ordered: none

## 2014-02-26 NOTE — Assessment & Plan Note (Addendum)
Resolved, takes Kcl 71meq daily. 02/11/14 K 4.3

## 2014-02-26 NOTE — Assessment & Plan Note (Addendum)
The lateral right lower eyelid-warm compress and Levofloxacin Ophthalmic Sol 0.5% 1-2gtt OD q2h x2 days then q4h x 5 days.

## 2014-02-26 NOTE — Assessment & Plan Note (Signed)
09/24/13 Iron 36, ferritin 34, TICB256, Fe Sat 14, Hgb 10.1, takes Iron 325mg  daily. IDA 10/25/13 Hgb 11.9 02/11/14 Hgb 10.7, Iron 40-continue Fe 02/26/14 continue to observe.

## 2014-02-26 NOTE — Assessment & Plan Note (Signed)
better on Effexor 75mg  and Mirtazapine 7.5mg  qhs. Sleeps well at night

## 2014-03-19 ENCOUNTER — Encounter: Payer: Self-pay | Admitting: Nurse Practitioner

## 2014-03-19 ENCOUNTER — Non-Acute Institutional Stay (SKILLED_NURSING_FACILITY): Payer: Medicare Other | Admitting: Nurse Practitioner

## 2014-03-19 DIAGNOSIS — K5900 Constipation, unspecified: Secondary | ICD-10-CM | POA: Diagnosis not present

## 2014-03-19 DIAGNOSIS — F32A Depression, unspecified: Secondary | ICD-10-CM

## 2014-03-19 DIAGNOSIS — N183 Chronic kidney disease, stage 3 unspecified: Secondary | ICD-10-CM

## 2014-03-19 DIAGNOSIS — G4700 Insomnia, unspecified: Secondary | ICD-10-CM

## 2014-03-19 DIAGNOSIS — M5440 Lumbago with sciatica, unspecified side: Secondary | ICD-10-CM

## 2014-03-19 DIAGNOSIS — L570 Actinic keratosis: Secondary | ICD-10-CM | POA: Diagnosis not present

## 2014-03-19 DIAGNOSIS — H00013 Hordeolum externum right eye, unspecified eyelid: Secondary | ICD-10-CM

## 2014-03-19 DIAGNOSIS — F0391 Unspecified dementia with behavioral disturbance: Secondary | ICD-10-CM | POA: Diagnosis not present

## 2014-03-19 DIAGNOSIS — D649 Anemia, unspecified: Secondary | ICD-10-CM

## 2014-03-19 DIAGNOSIS — F329 Major depressive disorder, single episode, unspecified: Secondary | ICD-10-CM | POA: Diagnosis not present

## 2014-03-19 DIAGNOSIS — N400 Enlarged prostate without lower urinary tract symptoms: Secondary | ICD-10-CM | POA: Diagnosis not present

## 2014-03-19 DIAGNOSIS — E876 Hypokalemia: Secondary | ICD-10-CM

## 2014-03-19 DIAGNOSIS — F03918 Unspecified dementia, unspecified severity, with other behavioral disturbance: Secondary | ICD-10-CM

## 2014-03-19 DIAGNOSIS — K219 Gastro-esophageal reflux disease without esophagitis: Secondary | ICD-10-CM

## 2014-03-19 DIAGNOSIS — I10 Essential (primary) hypertension: Secondary | ICD-10-CM

## 2014-03-19 NOTE — Assessment & Plan Note (Signed)
gradual decline  in SNF and continue Namenda

## 2014-03-19 NOTE — Assessment & Plan Note (Signed)
better on Effexor 75mg  and Mirtazapine 7.5mg  qhs. Sleeps well at night

## 2014-03-19 NOTE — Assessment & Plan Note (Signed)
Managed with Senokot S II bid and MOM q 3days-stable.

## 2014-03-19 NOTE — Assessment & Plan Note (Signed)
The lateral right lower eyelid-warm compress and Levofloxacin Ophthalmic Sol 0.5% 1-2gtt OD q2h x2 days then q4h x 5 days-not infected. Continue to observe.

## 2014-03-19 NOTE — Assessment & Plan Note (Signed)
Recurrent urinary retention-Foley inserted 11/04/12 at ED-successfully removed--continued Tamsulosin and Finasteride 11/06/13 c/o urinary frequency-UA C/S and US bladder scan.   11/07/13 US bladder: no significant postvoid residual volume is present.  11/08/13 Urine culture 40,000c/ml diphtheroids.   02/08/14 no urinary retention  stable

## 2014-03-19 NOTE — Assessment & Plan Note (Signed)
Creatinine 1.88 07/05/13(baseline 1.5-2.0)

## 2014-03-19 NOTE — Assessment & Plan Note (Signed)
Controlled on Amlodipine 5mg 

## 2014-03-19 NOTE — Progress Notes (Signed)
Patient ID: Vincent Potts, male   DOB: [birth year], 81 y.o.   MRN: [id number]   Code Status: DNR  Allergies  Allergen Reactions  . Olanzapine Other (See Comments)    unknown  . Rivastigmine Nausea Only    Chief Complaint  Patient presents with  . Medical Management of Chronic Issues  . Acute Visit    skin lesions top of head, R lower eyelid.     HPI: Patient is a 81 y.o. male seen in the SNF at [facility] today for evaluation of right lower eyelid stye, top of head AK,  and chronic medical condition Problem List Items Addressed This Visit    Stye    The lateral right lower eyelid-warm compress and Levofloxacin Ophthalmic Sol 0.5% 1-2gtt OD q2h x2 days then q4h x 5 days-not infected. Continue to observe.        Insomnia    staff reported 07/01/13 the patient stated he slept for 1st time in my bed in weeks. Continue Mirtazapine 7.5mg  qhs. Improved.        Hypokalemia    Resolved, takes Kcl 71meq daily. 02/11/14 K 4.3       Hypertension    Controlled on Amlodipine 5mg         GERD (gastroesophageal reflux disease)    09/11/13 consultant pharmacist: dc Omeprazole  stable. continue Zantac 75mg         Depression    better on Effexor 75mg  and Mirtazapine 7.5mg  qhs. Sleeps well at night       Dementia with behavioral disturbance    gradual decline  in SNF and continue Namenda       Constipation    Managed with Senokot S II bid and MOM q 3days-stable.        Chronic kidney disease    Creatinine 1.88 07/05/13(baseline 1.5-2.0)         BPH (benign prostatic hyperplasia)    Recurrent urinary retention-Foley inserted 11/04/12 at ED-successfully removed--continued Tamsulosin and Finasteride 11/06/13 c/o urinary frequency-UA C/S and US bladder scan.   11/07/13 US bladder: no significant postvoid residual volume is present.  11/08/13 Urine culture 40,000c/ml diphtheroids.   02/08/14 no urinary retention  stable        Back pain    Tylenol nightly and prn  Tylenol 650mg  q4h and Oxycodone 5mg  q4h prn available to him. He stated his back pain travels and feels like pain in his stomach.       Anemia - Primary    09/24/13 Iron 36, ferritin 34, TICB256, Fe Sat 14, Hgb 10.1, takes Iron 325mg  daily. IDA 10/25/13 Hgb 11.9 02/11/14 Hgb 10.7, Iron 40-continue Fe continue to observe.        AK (actinic keratosis)    Top of head-cutaneous horn-[facility]        Review of Systems  Constitutional: Negative for fever, chills, weight loss, malaise/fatigue and diaphoresis.       Eats well-gradual weight gain.   HENT: Positive for hearing loss. Negative for congestion, ear discharge, ear pain, nosebleeds, sore throat and tinnitus.   Eyes: Negative for blurred vision, double vision, photophobia, pain, discharge and redness.  Respiratory: Negative for cough, hemoptysis, sputum production, shortness of breath, wheezing and stridor.   Cardiovascular: Negative for chest pain, palpitations, orthopnea, claudication, leg swelling and PND.  Gastrointestinal: Negative for heartburn, nausea, vomiting, abdominal pain, diarrhea, constipation, blood in stool and melena.  Genitourinary: Positive for frequency. Negative for dysuria, urgency, hematuria and flank pain.  Musculoskeletal: Positive  for back pain. Negative for myalgias, joint pain, falls and neck pain.  Skin: Negative for itching and rash.       Top of head AK. Right lower eyelid stye.   Neurological: Negative for dizziness, tingling, tremors, sensory change, speech change, focal weakness, seizures, loss of consciousness, weakness and headaches.  Endo/Heme/Allergies: Negative for environmental allergies and polydipsia. Does not bruise/bleed easily.  Psychiatric/Behavioral: Positive for depression and memory loss. Negative for suicidal ideas, hallucinations and substance abuse. The patient is nervous/anxious and has insomnia.        Much improved.      Past Medical History  Diagnosis Date  . Thyroid  disease   . Anemia   . Vitamin D deficiency disease   . Hyponatremia   . Anxiety   . Parkinsonism   . Hearing loss   . Hypertension   . PVC (premature ventricular contraction)   . CVA (cerebral infarction)   . GERD (gastroesophageal reflux disease)   . Diverticulosis   . Sciatica   . Back pain   . Gait disorder   . Cataract   . Edema   . Keratosis   . Sciatica   . Cystic disease of liver   . Paresthesia   . Inguinal hernia   . Hearing loss   . Stroke 2010    no deficits  . Chronic kidney disease 2013    rhabdo  . H/O hiatal hernia 2008    repaired  . Depression 06/06/2012  . Dementia with behavioral disturbance 06/06/2012   Past Surgical History  Procedure Laterality Date  . Prostate surgery      cancer  . Hernia repair    . Appendectomy    . Esophagogastroduodenoscopy  10/25/2011    Procedure: ESOPHAGOGASTRODUODENOSCOPY (EGD);  Surgeon: Winfield Cunas., MD;  Location: [facility] ENDOSCOPY;  Service: Endoscopy;  Laterality: N/A;   Social History:   reports that he has never smoked. He does not have any smokeless tobacco history on file. He reports that he drinks alcohol. He reports that he does not use illicit drugs.  Family History  Problem Relation Age of Onset  . Hypertension Mother     Medications: Patient's Medications  New Prescriptions   No medications on file  Previous Medications   ACETAMINOPHEN (TYLENOL) 325 MG TABLET    Take 650 mg by mouth every 4 (four) hours as needed.   ACETAMINOPHEN (TYLENOL) 500 MG TABLET    Take 500 mg by mouth at bedtime. To be given with Restoril   AMLODIPINE (NORVASC) 5 MG TABLET    Take 1 tablet (5 mg total) by mouth every morning.   CARBOXYMETHYLCELLULOSE (REFRESH TEARS) 0.5 % SOLN    Place 1 drop into both eyes 3 (three) times daily.   CHOLECALCIFEROL (VITAMIN D) 1000 UNITS TABLET    Take 1,000 Units by mouth every morning.    CYCLOSPORINE (RESTASIS) 0.05 % OPHTHALMIC EMULSION    Place 1 drop into both eyes 2 (two) times  daily.    FEEDING SUPPLEMENT (ENSURE CLINICAL STRENGTH) LIQD    Take 237 mLs by mouth. Take one can daily as needed   FERROUS SULFATE 325 (65 FE) MG TABLET    Take 325 mg by mouth daily with breakfast.   FINASTERIDE (PROSCAR) 5 MG TABLET    Take 5 mg by mouth daily.   HYPROMELLOSE (GENTEAL OP)    Apply 1 drop to eye at bedtime.   MAGNESIUM HYDROXIDE (MILK OF MAGNESIA) 400 MG/5ML SUSPENSION  Take 30 mLs by mouth every 3 (three) days. Please take 30 ml by mouth daily prn for constipation.   MELATONIN 3 MG TABS    Take 3 mg by mouth at bedtime. Take one additional at bedtime for sleep   MEMANTINE HCL ER (NAMENDA XR) 28 MG CP24    Take 28 mg by mouth. Take one daily for memory   MIRTAZAPINE (REMERON) 7.5 MG TABLET    Take 7.5 mg by mouth at bedtime.   OXYCODONE (OXY-IR) 5 MG CAPSULE    Take 5 mg by mouth every 4 (four) hours as needed. pain   POTASSIUM CHLORIDE (K-DUR) 10 MEQ TABLET    Take 10 mEq by mouth daily.   RANITIDINE (ZANTAC) 150 MG TABLET    Take 150 mg by mouth at bedtime.   SENNA (SENOKOT) 8.6 MG TABLET    Take 2 tablets by mouth 2 (two) times daily.    SODIUM FLUORIDE (PREVIDENT 5000 PLUS) 1.1 % CREA DENTAL CREAM    Place 1 application onto teeth every evening.   TAMSULOSIN HCL (FLOMAX) 0.4 MG CAPS    Take 0.4 mg by mouth every morning.    VENLAFAXINE (EFFEXOR) 75 MG TABLET    Take 75 mg by mouth daily.  Modified Medications   No medications on file  Discontinued Medications   No medications on file    Physical Exam  Constitutional: He appears well-developed and well-nourished.  HENT:  Head: Normocephalic and atraumatic. Not macrocephalic.  Nose: Mucosal edema and rhinorrhea present. Right sinus exhibits no maxillary sinus tenderness and no frontal sinus tenderness. Left sinus exhibits no maxillary sinus tenderness and no frontal sinus tenderness.  Mouth/Throat: Uvula is midline. Posterior oropharyngeal erythema present. No oropharyngeal exudate or posterior oropharyngeal edema.   Eyes: EOM are normal. Pupils are equal, round, and reactive to light.  Lateral right lower eyelid stye  Neck: Normal range of motion. Neck supple. No JVD present. No thyromegaly present.  Cardiovascular: Normal rate and regular rhythm.   Murmur heard.  Systolic murmur is present with a grade of 2/6  Pulmonary/Chest: Effort normal and breath sounds normal. He has no wheezes. He has no rales.  Abdominal: Soft. Bowel sounds are normal. There is no tenderness.  Musculoskeletal: Normal range of motion. He exhibits tenderness. He exhibits no edema.  Scoliokyphosis, walking with walker with SBA. Chronic back pain. 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  Lymphadenopathy:    He has no cervical adenopathy.  Neurological: He is alert. He displays normal reflexes. No cranial nerve deficit. He exhibits normal muscle tone. Coordination normal.  Skin: Skin is warm and dry. No rash noted. No erythema.  AK top of head and right lower eyelid stye.    Psychiatric: His mood appears not anxious. His affect is not angry, not blunt and not labile. His speech is not delayed and not slurred. He is not agitated, not aggressive, not slowed, not withdrawn, not actively hallucinating and not combative. Thought content is not paranoid and not delusional. Cognition and memory are impaired. He expresses impulsivity. He does not exhibit a depressed mood. He exhibits abnormal recent memory. He is attentive.      Filed Vitals:   03/19/14 1204  BP: 146/89  Pulse: 83  Temp: 98.4 F (36.9 C)  TempSrc: Tympanic  Resp: 20      Labs reviewed: Basic Metabolic Panel:  Recent Labs  06/11/13 07/02/13 07/05/13 02/11/14  NA 135* 142 138 135*  K 4.4 3.8 3.9 4.3  BUN 26* 52* 27* 32*  CREATININE 1.6* 2.1* 1.9* 2.0*  TSH 1.04  --   --   --    Liver Function Tests:  Recent Labs  04/09/13 02/11/14  AST 10* 12*  ALT 10 8*  ALKPHOS 77 87   CBC:  Recent Labs  09/24/13 10/25/13 02/11/14  WBC 6.8 8.3 6.2    HGB 10.1* 11.9* 10.7*  HCT 30* 36* 32*  PLT 247 196 208       Assessment/Plan Anemia 09/24/13 Iron 36, ferritin 34, TICB256, Fe Sat 14, Hgb 10.1, takes Iron 325mg  daily. IDA 10/25/13 Hgb 11.9 02/11/14 Hgb 10.7, Iron 40-continue Fe continue to observe.     Back pain Tylenol nightly and prn Tylenol 650mg  q4h and Oxycodone 5mg  q4h prn available to him. He stated his back pain travels and feels like pain in his stomach.    BPH (benign prostatic hyperplasia) Recurrent urinary retention-Foley inserted 11/04/12 at ED-successfully removed--continued Tamsulosin and Finasteride 11/06/13 c/o urinary frequency-UA C/S and US bladder scan.   11/07/13 US bladder: no significant postvoid residual volume is present.  11/08/13 Urine culture 40,000c/ml diphtheroids.   02/08/14 no urinary retention  stable     Chronic kidney disease Creatinine 1.88 07/05/13(baseline 1.5-2.0)      Constipation Managed with Senokot S II bid and MOM q 3days-stable.     Dementia with behavioral disturbance gradual decline  in SNF and continue Namenda    Depression better on Effexor 75mg  and Mirtazapine 7.5mg  qhs. Sleeps well at night    GERD (gastroesophageal reflux disease) 09/11/13 consultant pharmacist: dc Omeprazole  stable. continue Zantac 75mg      Hypertension Controlled on Amlodipine 5mg      Hypokalemia Resolved, takes Kcl 21meq daily. 02/11/14 K 4.3    Insomnia staff reported 07/01/13 the patient stated he slept for 1st time in my bed in weeks. Continue Mirtazapine 7.5mg  qhs. Improved.     Stye The lateral right lower eyelid-warm compress and Levofloxacin Ophthalmic Sol 0.5% 1-2gtt OD q2h x2 days then q4h x 5 days-not infected. Continue to observe.     AK (actinic keratosis) Top of head-cutaneous horn-[facility]     Family/ Staff Communication: observe the patient.   Goals of Care: SNF  Labs/tests ordered: none

## 2014-03-19 NOTE — Assessment & Plan Note (Signed)
staff reported 07/01/13 the patient stated he slept for 1st time in my bed in weeks. Continue Mirtazapine 7.5mg  qhs. Improved.

## 2014-03-19 NOTE — Assessment & Plan Note (Signed)
09/11/13 consultant pharmacist: dc Omeprazole  stable. continue Zantac 75mg 

## 2014-03-19 NOTE — Assessment & Plan Note (Signed)
[facility]

## 2014-03-19 NOTE — Assessment & Plan Note (Signed)
09/24/13 Iron 36, ferritin 34, TICB256, Fe Sat 14, Hgb 10.1, takes Iron 325mg  daily. IDA 10/25/13 Hgb 11.9 02/11/14 Hgb 10.7, Iron 40-continue Fe continue to observe.

## 2014-03-19 NOTE — Assessment & Plan Note (Signed)
Tylenol nightly and prn Tylenol 650mg  q4h and Oxycodone 5mg  q4h prn available to him. He stated his back pain travels and feels like pain in his stomach.

## 2014-03-19 NOTE — Assessment & Plan Note (Signed)
Resolved, takes Kcl 51meq daily. 02/11/14 K 4.3

## 2014-04-08 DIAGNOSIS — H04123 Dry eye syndrome of bilateral lacrimal glands: Secondary | ICD-10-CM | POA: Diagnosis not present

## 2014-04-08 DIAGNOSIS — H1851 Endothelial corneal dystrophy: Secondary | ICD-10-CM | POA: Diagnosis not present

## 2014-04-23 ENCOUNTER — Encounter: Payer: Self-pay | Admitting: Nurse Practitioner

## 2014-04-23 ENCOUNTER — Non-Acute Institutional Stay (SKILLED_NURSING_FACILITY): Payer: Medicare Other | Admitting: Nurse Practitioner

## 2014-04-23 DIAGNOSIS — G4700 Insomnia, unspecified: Secondary | ICD-10-CM

## 2014-04-23 DIAGNOSIS — E876 Hypokalemia: Secondary | ICD-10-CM | POA: Diagnosis not present

## 2014-04-23 DIAGNOSIS — N400 Enlarged prostate without lower urinary tract symptoms: Secondary | ICD-10-CM | POA: Diagnosis not present

## 2014-04-23 DIAGNOSIS — H00013 Hordeolum externum right eye, unspecified eyelid: Secondary | ICD-10-CM | POA: Diagnosis not present

## 2014-04-23 DIAGNOSIS — F0391 Unspecified dementia with behavioral disturbance: Secondary | ICD-10-CM

## 2014-04-23 DIAGNOSIS — N183 Chronic kidney disease, stage 3 unspecified: Secondary | ICD-10-CM

## 2014-04-23 DIAGNOSIS — F03918 Unspecified dementia, unspecified severity, with other behavioral disturbance: Secondary | ICD-10-CM

## 2014-04-23 DIAGNOSIS — N189 Chronic kidney disease, unspecified: Secondary | ICD-10-CM | POA: Diagnosis not present

## 2014-04-23 DIAGNOSIS — F329 Major depressive disorder, single episode, unspecified: Secondary | ICD-10-CM | POA: Diagnosis not present

## 2014-04-23 DIAGNOSIS — M5440 Lumbago with sciatica, unspecified side: Secondary | ICD-10-CM | POA: Diagnosis not present

## 2014-04-23 DIAGNOSIS — I10 Essential (primary) hypertension: Secondary | ICD-10-CM | POA: Diagnosis not present

## 2014-04-23 DIAGNOSIS — K219 Gastro-esophageal reflux disease without esophagitis: Secondary | ICD-10-CM | POA: Diagnosis not present

## 2014-04-23 DIAGNOSIS — K5900 Constipation, unspecified: Secondary | ICD-10-CM

## 2014-04-23 DIAGNOSIS — D631 Anemia in chronic kidney disease: Secondary | ICD-10-CM

## 2014-04-23 DIAGNOSIS — F32A Depression, unspecified: Secondary | ICD-10-CM

## 2014-04-23 NOTE — Progress Notes (Signed)
Patient ID: Vincent Potts, male   DOB: [birth year], 81 y.o.   MRN: [id number]   Code Status: DNR  Allergies  Allergen Reactions  . Olanzapine Other (See Comments)    unknown  . Rivastigmine Nausea Only    Chief Complaint  Patient presents with  . Medical Management of Chronic Issues    HPI: Patient is a 81 y.o. male seen in the SNF at [facility] today for evaluation of chronic medical condition Problem List Items Addressed This Visit    Stye - Primary    Resolved.       Insomnia    staff reported 07/01/13 the patient stated he slept for 1st time in my bed in weeks. Continue Mirtazapine 7.5mg  qhs. Improved.         Hypokalemia    Resolved, takes Kcl 74meq daily. 02/11/14 K 4.3       Hypertension    Controlled on Amlodipine 5mg         GERD (gastroesophageal reflux disease)    09/11/13 consultant pharmacist: dc Omeprazole  stable. continue Zantac 75mg         Depression    better on Effexor 75mg  and Mirtazapine 7.5mg  qhs. Sleeps well at night        Dementia with behavioral disturbance    gradual decline  in SNF and continue Namenda        Constipation    Managed with Senokot S II bid and MOM q 3days-stable.         Chronic kidney disease    Creatinine 1.88 07/05/13(baseline 1.5-2.0)        BPH (benign prostatic hyperplasia)    Recurrent urinary retention-Foley inserted 11/04/12 at ED-successfully removed--continued Tamsulosin and Finasteride 11/06/13 c/o urinary frequency-UA C/S and US bladder scan.   11/07/13 US bladder: no significant postvoid residual volume is present.  11/08/13 Urine culture 40,000c/ml diphtheroids.   02/08/14 no urinary retention  stable       Back pain    Tylenol nightly and prn Tylenol 650mg  q4h and Oxycodone 5mg  q4h prn available to him. He stated his back pain travels and feels like pain in his stomach. -well managed pain.        Anemia    09/24/13 Iron 36, ferritin 34, TICB256, Fe Sat 14, Hgb 10.1, takes Iron  325mg  daily. IDA 10/25/13 Hgb 11.9 02/11/14 Hgb 10.7, Iron 40-continue Fe continue to observe.          Review of Systems  Constitutional: Negative for fever, chills, weight loss, malaise/fatigue and diaphoresis.  HENT: Positive for hearing loss. Negative for congestion, ear discharge, ear pain, nosebleeds, sore throat and tinnitus.   Eyes: Negative for blurred vision, double vision, photophobia, pain, discharge and redness.  Respiratory: Negative for cough, hemoptysis, sputum production, shortness of breath, wheezing and stridor.   Cardiovascular: Negative for chest pain, palpitations, orthopnea, claudication, leg swelling and PND.  Gastrointestinal: Negative for heartburn, nausea, vomiting, abdominal pain, diarrhea, constipation, blood in stool and melena.  Genitourinary: Positive for frequency. Negative for dysuria, urgency, hematuria and flank pain.  Musculoskeletal: Positive for back pain. Negative for myalgias, joint pain, falls and neck pain.  Skin: Negative for itching and rash.  Neurological: Negative for dizziness, tingling, tremors, sensory change, speech change, focal weakness, seizures, loss of consciousness, weakness and headaches.  Endo/Heme/Allergies: Negative for environmental allergies and polydipsia. Does not bruise/bleed easily.  Psychiatric/Behavioral: Positive for memory loss. Negative for depression, suicidal ideas, hallucinations and substance abuse. The patient is not nervous/anxious and  does not have insomnia.      Past Medical History  Diagnosis Date  . Thyroid disease   . Anemia   . Vitamin D deficiency disease   . Hyponatremia   . Anxiety   . Parkinsonism   . Hearing loss   . Hypertension   . PVC (premature ventricular contraction)   . CVA (cerebral infarction)   . GERD (gastroesophageal reflux disease)   . Diverticulosis   . Sciatica   . Back pain   . Gait disorder   . Cataract   . Edema   . Keratosis   . Sciatica   . Cystic disease of liver    . Paresthesia   . Inguinal hernia   . Hearing loss   . Stroke 2010    no deficits  . Chronic kidney disease 2013    rhabdo  . H/O hiatal hernia 2008    repaired  . Depression 06/06/2012  . Dementia with behavioral disturbance 06/06/2012   Past Surgical History  Procedure Laterality Date  . Prostate surgery      cancer  . Hernia repair    . Appendectomy    . Esophagogastroduodenoscopy  10/25/2011    Procedure: ESOPHAGOGASTRODUODENOSCOPY (EGD);  Surgeon: Winfield Cunas., MD;  Location: [facility] ENDOSCOPY;  Service: Endoscopy;  Laterality: N/A;   Social History:   reports that he has never smoked. He does not have any smokeless tobacco history on file. He reports that he drinks alcohol. He reports that he does not use illicit drugs.  Family History  Problem Relation Age of Onset  . Hypertension Mother     Medications: Patient's Medications  New Prescriptions   No medications on file  Previous Medications   ACETAMINOPHEN (TYLENOL) 325 MG TABLET    Take 650 mg by mouth every 4 (four) hours as needed.   ACETAMINOPHEN (TYLENOL) 500 MG TABLET    Take 500 mg by mouth at bedtime. To be given with Restoril   AMLODIPINE (NORVASC) 5 MG TABLET    Take 1 tablet (5 mg total) by mouth every morning.   CARBOXYMETHYLCELLULOSE (REFRESH TEARS) 0.5 % SOLN    Place 1 drop into both eyes 3 (three) times daily.   CHOLECALCIFEROL (VITAMIN D) 1000 UNITS TABLET    Take 1,000 Units by mouth every morning.    CYCLOSPORINE (RESTASIS) 0.05 % OPHTHALMIC EMULSION    Place 1 drop into both eyes 2 (two) times daily.    FEEDING SUPPLEMENT (ENSURE CLINICAL STRENGTH) LIQD    Take 237 mLs by mouth. Take one can daily as needed   FERROUS SULFATE 325 (65 FE) MG TABLET    Take 325 mg by mouth daily with breakfast.   FINASTERIDE (PROSCAR) 5 MG TABLET    Take 5 mg by mouth daily.   HYPROMELLOSE (GENTEAL OP)    Apply 1 drop to eye at bedtime.   MAGNESIUM HYDROXIDE (MILK OF MAGNESIA) 400 MG/5ML SUSPENSION    Take 30 mLs by  mouth every 3 (three) days. Please take 30 ml by mouth daily prn for constipation.   MELATONIN 3 MG TABS    Take 3 mg by mouth at bedtime. Take one additional at bedtime for sleep   MEMANTINE HCL ER (NAMENDA XR) 28 MG CP24    Take 28 mg by mouth. Take one daily for memory   MIRTAZAPINE (REMERON) 7.5 MG TABLET    Take 7.5 mg by mouth at bedtime.   OXYCODONE (OXY-IR) 5 MG CAPSULE    Take  5 mg by mouth every 4 (four) hours as needed. pain   POTASSIUM CHLORIDE (K-DUR) 10 MEQ TABLET    Take 10 mEq by mouth daily.   RANITIDINE (ZANTAC) 150 MG TABLET    Take 150 mg by mouth at bedtime.   SENNA (SENOKOT) 8.6 MG TABLET    Take 2 tablets by mouth 2 (two) times daily.    SODIUM FLUORIDE (PREVIDENT 5000 PLUS) 1.1 % CREA DENTAL CREAM    Place 1 application onto teeth every evening.   TAMSULOSIN HCL (FLOMAX) 0.4 MG CAPS    Take 0.4 mg by mouth every morning.    VENLAFAXINE (EFFEXOR) 75 MG TABLET    Take 75 mg by mouth daily.  Modified Medications   No medications on file  Discontinued Medications   No medications on file    Physical Exam  Constitutional: He appears well-developed and well-nourished.  HENT:  Head: Normocephalic and atraumatic. Not macrocephalic.  Nose: Mucosal edema and rhinorrhea present. Right sinus exhibits no maxillary sinus tenderness and no frontal sinus tenderness. Left sinus exhibits no maxillary sinus tenderness and no frontal sinus tenderness.  Mouth/Throat: Uvula is midline. Posterior oropharyngeal erythema present. No oropharyngeal exudate or posterior oropharyngeal edema.  Eyes: EOM are normal. Pupils are equal, round, and reactive to light.  Neck: Normal range of motion. Neck supple. No JVD present. No thyromegaly present.  Cardiovascular: Normal rate and regular rhythm.   Murmur heard.  Systolic murmur is present with a grade of 2/6  Pulmonary/Chest: Effort normal and breath sounds normal. He has no wheezes. He has no rales.  Abdominal: Soft. Bowel sounds are normal.  There is no tenderness.  Musculoskeletal: Normal range of motion. He exhibits tenderness. He exhibits no edema.  Scoliokyphosis, walking with walker with SBA. Chronic back pain. 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  Lymphadenopathy:    He has no cervical adenopathy.  Neurological: He is alert. He displays normal reflexes. No cranial nerve deficit. He exhibits normal muscle tone. Coordination normal.  Skin: Skin is warm and dry. No rash noted. No erythema.  AK top of head and right lower eyelid stye.    Psychiatric: His mood appears not anxious. His affect is not angry, not blunt and not labile. His speech is not delayed and not slurred. He is not agitated, not aggressive, not slowed, not withdrawn, not actively hallucinating and not combative. Thought content is not paranoid and not delusional. Cognition and memory are impaired. He expresses impulsivity. He does not exhibit a depressed mood. He exhibits abnormal recent memory. He is attentive.      Filed Vitals:   04/23/14 1509  BP: 124/90  Pulse: 84  Temp: 99.3 F (37.4 C)  TempSrc: Tympanic  Resp: 16      Labs reviewed: Basic Metabolic Panel:  Recent Labs  06/11/13 07/02/13 07/05/13 02/11/14  NA 135* 142 138 135*  K 4.4 3.8 3.9 4.3  BUN 26* 52* 27* 32*  CREATININE 1.6* 2.1* 1.9* 2.0*  TSH 1.04  --   --   --    Liver Function Tests:  Recent Labs  02/11/14  AST 12*  ALT 8*  ALKPHOS 87   CBC:  Recent Labs  09/24/13 10/25/13 02/11/14  WBC 6.8 8.3 6.2  HGB 10.1* 11.9* 10.7*  HCT 30* 36* 32*  PLT 247 196 208       Assessment/Plan Stye Resolved.    Insomnia staff reported 07/01/13 the patient stated he slept for 1st time in my  bed in weeks. Continue Mirtazapine 7.5mg  qhs. Improved.      Hypokalemia Resolved, takes Kcl 24meq daily. 02/11/14 K 4.3    Hypertension Controlled on Amlodipine 5mg      GERD (gastroesophageal reflux disease) 09/11/13 consultant pharmacist: dc Omeprazole    stable. continue Zantac 75mg      Depression better on Effexor 75mg  and Mirtazapine 7.5mg  qhs. Sleeps well at night     Dementia with behavioral disturbance gradual decline  in SNF and continue Namenda     Constipation Managed with Senokot S II bid and MOM q 3days-stable.      Chronic kidney disease Creatinine 1.88 07/05/13(baseline 1.5-2.0)     BPH (benign prostatic hyperplasia) Recurrent urinary retention-Foley inserted 11/04/12 at ED-successfully removed--continued Tamsulosin and Finasteride 11/06/13 c/o urinary frequency-UA C/S and US bladder scan.   11/07/13 US bladder: no significant postvoid residual volume is present.  11/08/13 Urine culture 40,000c/ml diphtheroids.   02/08/14 no urinary retention  stable    Back pain Tylenol nightly and prn Tylenol 650mg  q4h and Oxycodone 5mg  q4h prn available to him. He stated his back pain travels and feels like pain in his stomach. -well managed pain.     Anemia 09/24/13 Iron 36, ferritin 34, TICB256, Fe Sat 14, Hgb 10.1, takes Iron 325mg  daily. IDA 10/25/13 Hgb 11.9 02/11/14 Hgb 10.7, Iron 40-continue Fe continue to observe.       Family/ Staff Communication: observe the patient.   Goals of Care: SNF  Labs/tests ordered: none

## 2014-04-23 NOTE — Assessment & Plan Note (Signed)
staff reported 07/01/13 the patient stated he slept for 1st time in my bed in weeks. Continue Mirtazapine 7.5mg  qhs. Improved.

## 2014-04-23 NOTE — Assessment & Plan Note (Signed)
Controlled on Amlodipine 5mg 

## 2014-04-23 NOTE — Assessment & Plan Note (Signed)
gradual decline  in SNF and continue Namenda

## 2014-04-23 NOTE — Assessment & Plan Note (Signed)
Creatinine 1.88 07/05/13(baseline 1.5-2.0)

## 2014-04-23 NOTE — Assessment & Plan Note (Signed)
09/24/13 Iron 36, ferritin 34, TICB256, Fe Sat 14, Hgb 10.1, takes Iron 325mg  daily. IDA 10/25/13 Hgb 11.9 02/11/14 Hgb 10.7, Iron 40-continue Fe continue to observe.

## 2014-04-23 NOTE — Assessment & Plan Note (Signed)
better on Effexor 75mg  and Mirtazapine 7.5mg  qhs. Sleeps well at night

## 2014-04-23 NOTE — Assessment & Plan Note (Signed)
Recurrent urinary retention-Foley inserted 11/04/12 at ED-successfully removed--continued Tamsulosin and Finasteride 11/06/13 c/o urinary frequency-UA C/S and US bladder scan.   11/07/13 US bladder: no significant postvoid residual volume is present.  11/08/13 Urine culture 40,000c/ml diphtheroids.   02/08/14 no urinary retention  stable

## 2014-04-23 NOTE — Assessment & Plan Note (Signed)
09/11/13 consultant pharmacist: dc Omeprazole  stable. continue Zantac 75mg 

## 2014-04-23 NOTE — Assessment & Plan Note (Signed)
Tylenol nightly and prn Tylenol 650mg  q4h and Oxycodone 5mg  q4h prn available to him. He stated his back pain travels and feels like pain in his stomach. -well managed pain.

## 2014-04-23 NOTE — Assessment & Plan Note (Signed)
Resolved

## 2014-04-23 NOTE — Assessment & Plan Note (Signed)
Resolved, takes Kcl 10meq daily. 02/11/14 K 4.3

## 2014-04-23 NOTE — Assessment & Plan Note (Signed)
Managed with Senokot S II bid and MOM q 3days-stable.

## 2014-04-26 DIAGNOSIS — D485 Neoplasm of uncertain behavior of skin: Secondary | ICD-10-CM | POA: Diagnosis not present

## 2014-04-26 DIAGNOSIS — D044 Carcinoma in situ of skin of scalp and neck: Secondary | ICD-10-CM | POA: Diagnosis not present

## 2014-04-29 DIAGNOSIS — H02834 Dermatochalasis of left upper eyelid: Secondary | ICD-10-CM | POA: Diagnosis not present

## 2014-04-29 DIAGNOSIS — H1851 Endothelial corneal dystrophy: Secondary | ICD-10-CM | POA: Diagnosis not present

## 2014-04-29 DIAGNOSIS — H02831 Dermatochalasis of right upper eyelid: Secondary | ICD-10-CM | POA: Diagnosis not present

## 2014-04-29 DIAGNOSIS — Z961 Presence of intraocular lens: Secondary | ICD-10-CM | POA: Diagnosis not present

## 2014-05-14 ENCOUNTER — Non-Acute Institutional Stay (SKILLED_NURSING_FACILITY): Payer: Medicare Other | Admitting: Nurse Practitioner

## 2014-05-14 ENCOUNTER — Encounter: Payer: Self-pay | Admitting: Nurse Practitioner

## 2014-05-14 DIAGNOSIS — D509 Iron deficiency anemia, unspecified: Secondary | ICD-10-CM | POA: Diagnosis not present

## 2014-05-14 DIAGNOSIS — K219 Gastro-esophageal reflux disease without esophagitis: Secondary | ICD-10-CM | POA: Diagnosis not present

## 2014-05-14 DIAGNOSIS — M5440 Lumbago with sciatica, unspecified side: Secondary | ICD-10-CM

## 2014-05-14 DIAGNOSIS — N183 Chronic kidney disease, stage 3 unspecified: Secondary | ICD-10-CM

## 2014-05-14 DIAGNOSIS — N400 Enlarged prostate without lower urinary tract symptoms: Secondary | ICD-10-CM

## 2014-05-14 DIAGNOSIS — E876 Hypokalemia: Secondary | ICD-10-CM | POA: Diagnosis not present

## 2014-05-14 DIAGNOSIS — G4700 Insomnia, unspecified: Secondary | ICD-10-CM | POA: Diagnosis not present

## 2014-05-14 DIAGNOSIS — F32A Depression, unspecified: Secondary | ICD-10-CM

## 2014-05-14 DIAGNOSIS — I10 Essential (primary) hypertension: Secondary | ICD-10-CM | POA: Diagnosis not present

## 2014-05-14 DIAGNOSIS — F329 Major depressive disorder, single episode, unspecified: Secondary | ICD-10-CM

## 2014-05-14 DIAGNOSIS — J329 Chronic sinusitis, unspecified: Secondary | ICD-10-CM

## 2014-05-14 DIAGNOSIS — K5900 Constipation, unspecified: Secondary | ICD-10-CM | POA: Diagnosis not present

## 2014-05-14 DIAGNOSIS — F0391 Unspecified dementia with behavioral disturbance: Secondary | ICD-10-CM | POA: Diagnosis not present

## 2014-05-14 DIAGNOSIS — F03918 Unspecified dementia, unspecified severity, with other behavioral disturbance: Secondary | ICD-10-CM

## 2014-05-14 NOTE — Assessment & Plan Note (Signed)
Managed with Senokot S II bid and MOM q 3days-stable.

## 2014-05-14 NOTE — Assessment & Plan Note (Signed)
09/24/13 Iron 36, ferritin 34, TICB256, Fe Sat 14, Hgb 10.1, takes Iron 325mg  daily. IDA 10/25/13 Hgb 11.9 02/11/14 Hgb 10.7, Iron 40-continue Fe continue to observe.

## 2014-05-14 NOTE — Assessment & Plan Note (Signed)
Resolved, takes Kcl 70meq daily. 02/11/14 K 4.3 Update BMP

## 2014-05-14 NOTE — Assessment & Plan Note (Signed)
Tylenol nightly and prn Tylenol 650mg  q4h and Oxycodone 5mg  q4h prn available to him. He stated his back pain travels and feels like pain in his stomach. -well managed pain.

## 2014-05-14 NOTE — Assessment & Plan Note (Signed)
09/11/13 consultant pharmacist: dc Omeprazole  stable. continue Zantac 75mg 

## 2014-05-14 NOTE — Assessment & Plan Note (Signed)
Chronic. Change Claritin to prn-? Efficacy-ABX helped in the past-Septra DS bid x 10days. Continue Flonase

## 2014-05-14 NOTE — Assessment & Plan Note (Signed)
Controlled on Amlodipine 5mg 

## 2014-05-14 NOTE — Assessment & Plan Note (Signed)
Recurrent urinary retention-Foley inserted 11/04/12 at ED-successfully removed--continued Tamsulosin and Finasteride 11/06/13 c/o urinary frequency-UA C/S and US bladder scan.   11/07/13 US bladder: no significant postvoid residual volume is present.  11/08/13 Urine culture 40,000c/ml diphtheroids.   02/08/14 no urinary retention  stable

## 2014-05-14 NOTE — Assessment & Plan Note (Signed)
better on Effexor 75mg  and Mirtazapine 7.5mg  qhs. Sleeps well at night

## 2014-05-14 NOTE — Assessment & Plan Note (Signed)
gradual decline  in SNF and continue Namenda

## 2014-05-14 NOTE — Assessment & Plan Note (Signed)
Creatinine 1.88 07/05/13(baseline 1.5-2.0)

## 2014-05-14 NOTE — Assessment & Plan Note (Signed)
staff reported 07/01/13 the patient stated he slept for 1st time in my bed in weeks. Continue Mirtazapine 7.5mg  qhs. Improved.

## 2014-05-14 NOTE — Progress Notes (Signed)
Patient ID: Vincent Potts, male   DOB: January 09, 1925, 81 y.o.   MRN: [id number]   Code Status: DNR  Allergies  Allergen Reactions  . Olanzapine Other (See Comments)    unknown  . Rivastigmine Nausea Only    Chief Complaint  Patient presents with  . Medical Management of Chronic Issues  . Acute Visit    nasal congestion    HPI: Patient is a 81 y.o. male seen in the SNF at [facility] today for evaluation of nasal congestion and other chronic medical conditions.  Problem List Items Addressed This Visit    Hypertension    Controlled on Amlodipine 5mg       Back pain    Tylenol nightly and prn Tylenol 650mg  q4h and Oxycodone 5mg  q4h prn available to him. He stated his back pain travels and feels like pain in his stomach. -well managed pain.        GERD (gastroesophageal reflux disease)    09/11/13 consultant pharmacist: dc Omeprazole  stable. continue Zantac 75mg        Anemia    09/24/13 Iron 36, ferritin 34, TICB256, Fe Sat 14, Hgb 10.1, takes Iron 325mg  daily. IDA 10/25/13 Hgb 11.9 02/11/14 Hgb 10.7, Iron 40-continue Fe continue to observe.        Depression    better on Effexor 75mg  and Mirtazapine 7.5mg  qhs. Sleeps well at night        Dementia with behavioral disturbance    gradual decline  in SNF and continue Namenda        Constipation    Managed with Senokot S II bid and MOM q 3days-stable.         BPH (benign prostatic hyperplasia)    Recurrent urinary retention-Foley inserted 11/04/12 at ED-successfully removed--continued Tamsulosin and Finasteride 11/06/13 c/o urinary frequency-UA C/S and US bladder scan.   11/07/13 US bladder: no significant postvoid residual volume is present.  11/08/13 Urine culture 40,000c/ml diphtheroids.   02/08/14 no urinary retention  stable        Chronic kidney disease    Creatinine 1.88 07/05/13(baseline 1.5-2.0)        Hypokalemia    Resolved, takes Kcl 76meq daily. 02/11/14 K 4.3 Update BMP        Insomnia    staff reported 07/01/13 the patient stated he slept for 1st time in my bed in weeks. Continue Mirtazapine 7.5mg  qhs. Improved.         Sinusitis, chronic - Primary    Chronic. Change Claritin to prn-? Efficacy-ABX helped in the past-Septra DS bid x 10days. Continue Flonase         Review of Systems:  Review of Systems  Constitutional: Negative for fever, chills and diaphoresis.  HENT: Positive for congestion, hearing loss and sinus pressure. Negative for ear discharge, ear pain, nosebleeds, postnasal drip, rhinorrhea, sore throat and tinnitus.        Nasal congestion at night  Eyes: Negative for photophobia, pain, discharge and redness.  Respiratory: Negative for cough, shortness of breath, wheezing and stridor.   Cardiovascular: Negative for chest pain, palpitations and leg swelling.  Gastrointestinal: Negative for nausea, vomiting, abdominal pain, diarrhea, constipation and blood in stool.  Endocrine: Negative for polydipsia.  Genitourinary: Positive for frequency. Negative for dysuria, urgency, hematuria and flank pain.  Musculoskeletal: Positive for back pain. Negative for myalgias and neck pain.  Skin: Negative for rash.  Allergic/Immunologic: Negative for environmental allergies.  Neurological: Negative for dizziness, tremors, seizures, weakness and headaches.  Hematological:  Does not bruise/bleed easily.  Psychiatric/Behavioral: Negative for suicidal ideas and hallucinations. The patient is not nervous/anxious.      Past Medical History  Diagnosis Date  . Thyroid disease   . Anemia   . Vitamin D deficiency disease   . Hyponatremia   . Anxiety   . Parkinsonism   . Hearing loss   . Hypertension   . PVC (premature ventricular contraction)   . CVA (cerebral infarction)   . GERD (gastroesophageal reflux disease)   . Diverticulosis   . Sciatica   . Back pain   . Gait disorder   . Cataract   . Edema   . Keratosis   . Sciatica   . Cystic disease of liver    . Paresthesia   . Inguinal hernia   . Hearing loss   . Stroke 2010    no deficits  . Chronic kidney disease 2013    rhabdo  . H/O hiatal hernia 2008    repaired  . Depression 06/06/2012  . Dementia with behavioral disturbance 06/06/2012   Past Surgical History  Procedure Laterality Date  . Prostate surgery      cancer  . Hernia repair    . Appendectomy    . Esophagogastroduodenoscopy  10/25/2011    Procedure: ESOPHAGOGASTRODUODENOSCOPY (EGD);  Surgeon: Winfield Cunas., MD;  Location: [facility] ENDOSCOPY;  Service: Endoscopy;  Laterality: N/A;   Social History:   reports that he has never smoked. He does not have any smokeless tobacco history on file. He reports that he drinks alcohol. He reports that he does not use illicit drugs.  Family History  Problem Relation Age of Onset  . Hypertension Mother     Medications: Patient's Medications  New Prescriptions   No medications on file  Previous Medications   ACETAMINOPHEN (TYLENOL) 325 MG TABLET    Take 650 mg by mouth every 4 (four) hours as needed.   ACETAMINOPHEN (TYLENOL) 500 MG TABLET    Take 500 mg by mouth at bedtime. To be given with Restoril   AMLODIPINE (NORVASC) 5 MG TABLET    Take 1 tablet (5 mg total) by mouth every morning.   CARBOXYMETHYLCELLULOSE (REFRESH TEARS) 0.5 % SOLN    Place 1 drop into both eyes 3 (three) times daily.   CHOLECALCIFEROL (VITAMIN D) 1000 UNITS TABLET    Take 1,000 Units by mouth every morning.    CYCLOSPORINE (RESTASIS) 0.05 % OPHTHALMIC EMULSION    Place 1 drop into both eyes 2 (two) times daily.    FEEDING SUPPLEMENT (ENSURE CLINICAL STRENGTH) LIQD    Take 237 mLs by mouth. Take one can daily as needed   FERROUS SULFATE 325 (65 FE) MG TABLET    Take 325 mg by mouth daily with breakfast.   FINASTERIDE (PROSCAR) 5 MG TABLET    Take 5 mg by mouth daily.   HYPROMELLOSE (GENTEAL OP)    Apply 1 drop to eye at bedtime.   MAGNESIUM HYDROXIDE (MILK OF MAGNESIA) 400 MG/5ML SUSPENSION    Take 30 mLs by  mouth every 3 (three) days. Please take 30 ml by mouth daily prn for constipation.   MELATONIN 3 MG TABS    Take 3 mg by mouth at bedtime. Take one additional at bedtime for sleep   MEMANTINE HCL ER (NAMENDA XR) 28 MG CP24    Take 28 mg by mouth. Take one daily for memory   MIRTAZAPINE (REMERON) 7.5 MG TABLET    Take 7.5 mg by mouth  at bedtime.   OXYCODONE (OXY-IR) 5 MG CAPSULE    Take 5 mg by mouth every 4 (four) hours as needed. pain   POTASSIUM CHLORIDE (K-DUR) 10 MEQ TABLET    Take 10 mEq by mouth daily.   RANITIDINE (ZANTAC) 150 MG TABLET    Take 150 mg by mouth at bedtime.   SENNA (SENOKOT) 8.6 MG TABLET    Take 2 tablets by mouth 2 (two) times daily.    SODIUM FLUORIDE (PREVIDENT 5000 PLUS) 1.1 % CREA DENTAL CREAM    Place 1 application onto teeth every evening.   TAMSULOSIN HCL (FLOMAX) 0.4 MG CAPS    Take 0.4 mg by mouth every morning.    VENLAFAXINE (EFFEXOR) 75 MG TABLET    Take 75 mg by mouth daily.  Modified Medications   No medications on file  Discontinued Medications   No medications on file     Physical Exam: Physical Exam  Constitutional: He appears well-developed and well-nourished.  HENT:  Head: Normocephalic and atraumatic. Not macrocephalic.  Nose: Mucosal edema and rhinorrhea present. Right sinus exhibits no maxillary sinus tenderness and no frontal sinus tenderness. Left sinus exhibits no maxillary sinus tenderness and no frontal sinus tenderness.  Mouth/Throat: Uvula is midline. Posterior oropharyngeal erythema present. No oropharyngeal exudate or posterior oropharyngeal edema.  Eyes: EOM are normal. Pupils are equal, round, and reactive to light.  Neck: Normal range of motion. Neck supple. No JVD present. No thyromegaly present.  Cardiovascular: Normal rate and regular rhythm.   Murmur heard.  Systolic murmur is present with a grade of 2/6  Pulmonary/Chest: Effort normal and breath sounds normal. He has no wheezes. He has no rales.  Abdominal: Soft. Bowel  sounds are normal. There is no tenderness.  Musculoskeletal: Normal range of motion. He exhibits tenderness. He exhibits no edema.  Scoliokyphosis, walking with walker with SBA. Chronic back pain. 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  Lymphadenopathy:    He has no cervical adenopathy.  Neurological: He is alert. He displays normal reflexes. No cranial nerve deficit. He exhibits normal muscle tone. Coordination normal.  Skin: Skin is warm and dry. No rash noted. No erythema.  AK top of head and right lower eyelid stye.    Psychiatric: His mood appears not anxious. His affect is not angry, not blunt and not labile. His speech is not delayed and not slurred. He is not agitated, not aggressive, not slowed, not withdrawn, not actively hallucinating and not combative. Thought content is not paranoid and not delusional. Cognition and memory are impaired. He expresses impulsivity. He does not exhibit a depressed mood. He exhibits abnormal recent memory. He is attentive.   Filed Vitals:   05/14/14 1645  BP: 136/85  Pulse: 90  Temp: 98.2 F (36.8 C)  TempSrc: Tympanic  Resp: 20      Labs reviewed: Basic Metabolic Panel:  Recent Labs  06/11/13 07/02/13 07/05/13 02/11/14  NA 135* 142 138 135*  K 4.4 3.8 3.9 4.3  BUN 26* 52* 27* 32*  CREATININE 1.6* 2.1* 1.9* 2.0*  TSH 1.04  --   --   --    Liver Function Tests:  Recent Labs  02/11/14  AST 12*  ALT 8*  ALKPHOS 87   No results for input(s): LIPASE, AMYLASE in the last 8760 hours. No results for input(s): AMMONIA in the last 8760 hours. CBC:  Recent Labs  09/24/13 10/25/13 02/11/14  WBC 6.8 8.3 6.2  HGB 10.1* 11.9* 10.7*  HCT  30* 36* 32*  PLT 247 196 208   Lipid Panel: No results for input(s): CHOL, HDL, LDLCALC, TRIG, CHOLHDL, LDLDIRECT in the last 8760 hours.  Past Procedures:  None recently  Assessment/Plan Sinusitis, chronic Chronic. Change Claritin to prn-? Efficacy-ABX helped in the past-Septra DS bid  x 10days. Continue Flonase   Insomnia staff reported 07/01/13 the patient stated he slept for 1st time in my bed in weeks. Continue Mirtazapine 7.5mg  qhs. Improved.      Hypokalemia Resolved, takes Kcl 10meq daily. 02/11/14 K 4.3 Update BMP     Hypertension Controlled on Amlodipine 5mg    GERD (gastroesophageal reflux disease) 09/11/13 consultant pharmacist: dc Omeprazole  stable. continue Zantac 75mg     Depression better on Effexor 75mg  and Mirtazapine 7.5mg  qhs. Sleeps well at night     Dementia with behavioral disturbance gradual decline  in SNF and continue Namenda     Constipation Managed with Senokot S II bid and MOM q 3days-stable.      Chronic kidney disease Creatinine 1.88 07/05/13(baseline 1.5-2.0)     BPH (benign prostatic hyperplasia) Recurrent urinary retention-Foley inserted 11/04/12 at ED-successfully removed--continued Tamsulosin and Finasteride 11/06/13 c/o urinary frequency-UA C/S and US bladder scan.   11/07/13 US bladder: no significant postvoid residual volume is present.  11/08/13 Urine culture 40,000c/ml diphtheroids.   02/08/14 no urinary retention  stable     Back pain Tylenol nightly and prn Tylenol 650mg  q4h and Oxycodone 5mg  q4h prn available to him. He stated his back pain travels and feels like pain in his stomach. -well managed pain.     Anemia 09/24/13 Iron 36, ferritin 34, TICB256, Fe Sat 14, Hgb 10.1, takes Iron 325mg  daily. IDA 10/25/13 Hgb 11.9 02/11/14 Hgb 10.7, Iron 40-continue Fe continue to observe.       Family/ Staff Communication: observe the patient.   Goals of Care: SNF  Labs/tests ordered: BMP

## 2014-05-16 DIAGNOSIS — E876 Hypokalemia: Secondary | ICD-10-CM | POA: Diagnosis not present

## 2014-05-16 LAB — BASIC METABOLIC PANEL
BUN: 34 mg/dL — AB (ref 4–21)
CREATININE: 2.3 mg/dL — AB (ref 0.6–1.3)
GLUCOSE: 73 mg/dL
POTASSIUM: 4.1 mmol/L (ref 3.4–5.3)
SODIUM: 138 mmol/L (ref 137–147)

## 2014-05-17 ENCOUNTER — Other Ambulatory Visit: Payer: Self-pay | Admitting: Nurse Practitioner

## 2014-05-17 DIAGNOSIS — E871 Hypo-osmolality and hyponatremia: Secondary | ICD-10-CM

## 2014-05-17 DIAGNOSIS — N183 Chronic kidney disease, stage 3 unspecified: Secondary | ICD-10-CM

## 2014-05-17 DIAGNOSIS — E876 Hypokalemia: Secondary | ICD-10-CM

## 2014-05-23 ENCOUNTER — Non-Acute Institutional Stay (SKILLED_NURSING_FACILITY): Payer: Medicare Other | Admitting: Internal Medicine

## 2014-05-23 DIAGNOSIS — N183 Chronic kidney disease, stage 3 unspecified: Secondary | ICD-10-CM

## 2014-05-23 DIAGNOSIS — K219 Gastro-esophageal reflux disease without esophagitis: Secondary | ICD-10-CM | POA: Diagnosis not present

## 2014-05-23 DIAGNOSIS — F03918 Unspecified dementia, unspecified severity, with other behavioral disturbance: Secondary | ICD-10-CM

## 2014-05-23 DIAGNOSIS — G4700 Insomnia, unspecified: Secondary | ICD-10-CM | POA: Diagnosis not present

## 2014-05-23 DIAGNOSIS — F0391 Unspecified dementia with behavioral disturbance: Secondary | ICD-10-CM

## 2014-05-23 DIAGNOSIS — D509 Iron deficiency anemia, unspecified: Secondary | ICD-10-CM | POA: Diagnosis not present

## 2014-05-23 DIAGNOSIS — I10 Essential (primary) hypertension: Secondary | ICD-10-CM | POA: Diagnosis not present

## 2014-05-23 NOTE — Progress Notes (Signed)
Patient ID: Vincent Potts, male   DOB: 20-Dec-1924, 81 y.o.   MRN: [id number]    [place] Room Number: N [id number]  Place of Service: SNF (31)    Allergies  Allergen Reactions  . Olanzapine Other (See Comments)    unknown  . Rivastigmine Nausea Only    Chief Complaint  Patient presents with  . Medical Management of Chronic Issues    HPI:  Chronic kidney disease, stage 3 (moderate) on 05/16/2014 BUN 34 and creatinine 2.31. 02/11/2014 BUN 32, creatinine 2.00. Continue to follow-up with routine labs every few months.  Dementia with behavioral disturbance: Short-term memory is poor. Behavioral disturbances in the past have calmed down quite a bit. Patient has adapted fully to the skilled unit.  Essential hypertension: Controlled  Gastroesophageal reflux disease without esophagitis: Asymptomatic on current medication  Insomnia: Improved on current medication  Iron deficiency anemia: Further follow-up needed. Patient remains on iron. On 02/11/2014, serum iron was low at 40, hemoglobin was 10.7 with MCV 88.7.    Medications: Patient's Medications  New Prescriptions   No medications on file  Previous Medications   ACETAMINOPHEN (TYLENOL) 325 MG TABLET    Take 650 mg by mouth every 4 (four) hours as needed.   ACETAMINOPHEN (TYLENOL) 500 MG TABLET    Take 500 mg by mouth at bedtime. To be given with Restoril   AMLODIPINE (NORVASC) 5 MG TABLET    Take 1 tablet (5 mg total) by mouth every morning.   CARBOXYMETHYLCELLULOSE (REFRESH TEARS) 0.5 % SOLN    Place 1 drop into both eyes 3 (three) times daily.   CHOLECALCIFEROL (VITAMIN D) 1000 UNITS TABLET    Take 1,000 Units by mouth every morning.    CYCLOSPORINE (RESTASIS) 0.05 % OPHTHALMIC EMULSION    Place 1 drop into both eyes 2 (two) times daily.    FEEDING SUPPLEMENT (ENSURE CLINICAL STRENGTH) LIQD    Take 237 mLs by mouth. Take one can daily as needed   FERROUS SULFATE 325 (65 FE) MG TABLET    Take 325 mg by  mouth daily with breakfast.   FINASTERIDE (PROSCAR) 5 MG TABLET    Take 5 mg by mouth daily.   HYPROMELLOSE (GENTEAL OP)    Apply 1 drop to eye at bedtime.   MAGNESIUM HYDROXIDE (MILK OF MAGNESIA) 400 MG/5ML SUSPENSION    Take 30 mLs by mouth every 3 (three) days. Please take 30 ml by mouth daily prn for constipation.   MELATONIN 3 MG TABS    Take 3 mg by mouth at bedtime. Take one additional at bedtime for sleep   MEMANTINE HCL ER (NAMENDA XR) 28 MG CP24    Take 28 mg by mouth. Take one daily for memory   MIRTAZAPINE (REMERON) 7.5 MG TABLET    Take 7.5 mg by mouth at bedtime.   OXYCODONE (OXY-IR) 5 MG CAPSULE    Take 5 mg by mouth every 4 (four) hours as needed. pain   POTASSIUM CHLORIDE (K-DUR) 10 MEQ TABLET    Take 10 mEq by mouth daily.   RANITIDINE (ZANTAC) 150 MG TABLET    Take 150 mg by mouth at bedtime.   SENNA (SENOKOT) 8.6 MG TABLET    Take 2 tablets by mouth 2 (two) times daily.    SODIUM FLUORIDE (PREVIDENT 5000 PLUS) 1.1 % CREA DENTAL CREAM    Place 1 application onto teeth every evening.   TAMSULOSIN HCL (FLOMAX) 0.4 MG CAPS    Take  0.4 mg by mouth every morning.    VENLAFAXINE (EFFEXOR) 75 MG TABLET    Take 75 mg by mouth daily.  Modified Medications   No medications on file  Discontinued Medications   No medications on file     Review of Systems  Constitutional: Negative for fever, chills and diaphoresis.  HENT: Positive for congestion, hearing loss and sinus pressure. Negative for ear discharge, ear pain, nosebleeds, postnasal drip, rhinorrhea, sore throat and tinnitus.        Nasal congestion at night  Eyes: Negative for photophobia, pain, discharge and redness.  Respiratory: Negative for cough, shortness of breath, wheezing and stridor.   Cardiovascular: Negative for chest pain, palpitations and leg swelling.  Gastrointestinal: Negative for nausea, vomiting, abdominal pain, diarrhea, constipation and blood in stool.  Endocrine: Negative for polydipsia.  Genitourinary:  Positive for frequency. Negative for dysuria, urgency, hematuria and flank pain.  Musculoskeletal: Positive for back pain. Negative for myalgias and neck pain.  Skin: Negative for rash.  Allergic/Immunologic: Negative for environmental allergies.  Neurological: Negative for dizziness, tremors, seizures, weakness and headaches.  Hematological: Does not bruise/bleed easily.  Psychiatric/Behavioral: Negative for suicidal ideas and hallucinations. The patient is not nervous/anxious.     Filed Vitals:   05/23/14 1540  BP: 124/68  Pulse: 73  Temp: 97.6 F (36.4 C)  Resp: 20  Height: 5' (1.524 m)  Weight: 146 lb (66.225 kg)  SpO2: 96%   Body mass index is 28.51 kg/(m^2).  Physical Exam  Constitutional: He appears well-developed and well-nourished.  HENT:  Head: Normocephalic and atraumatic. Not macrocephalic.  Nose: Mucosal edema and rhinorrhea present. Right sinus exhibits no maxillary sinus tenderness and no frontal sinus tenderness. Left sinus exhibits no maxillary sinus tenderness and no frontal sinus tenderness.  Mouth/Throat: Uvula is midline. Posterior oropharyngeal erythema present. No oropharyngeal exudate or posterior oropharyngeal edema.  Eyes: EOM are normal. Pupils are equal, round, and reactive to light.  Neck: Normal range of motion. Neck supple. No JVD present. No thyromegaly present.  Cardiovascular: Normal rate and regular rhythm.   Murmur heard.  Systolic murmur is present with a grade of 2/6  Pulmonary/Chest: Effort normal and breath sounds normal. He has no wheezes. He has no rales.  Abdominal: Soft. Bowel sounds are normal. There is no tenderness.  Musculoskeletal: Normal range of motion. He exhibits tenderness. He exhibits no edema.  Scoliokyphosis, walking with walker with SBA. Chronic back pain. 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 Patient has lost 6 inches in height since 2006.  Lymphadenopathy:    He has no cervical adenopathy.  Neurological: He  is alert. He displays normal reflexes. No cranial nerve deficit. He exhibits normal muscle tone. Coordination normal.  Skin: Skin is warm and dry. No rash noted. No erythema.  AK top of head.  Psychiatric: His mood appears not anxious. His affect is not angry, not blunt and not labile. His speech is not delayed and not slurred. He is not agitated, not aggressive, not slowed, not withdrawn, not actively hallucinating and not combative. Thought content is not paranoid and not delusional. Cognition and memory are impaired. He expresses impulsivity. He does not exhibit a depressed mood. He exhibits abnormal recent memory. He is attentive.     Labs reviewed: Lab on 05/17/2014  Component Date Value Ref Range Status  . Glucose 05/16/2014 73   Final  . BUN 05/16/2014 34* 4 - 21 mg/dL Final  . Creatinine 05/16/2014 2.3* 0.6 - 1.3 mg/dL Final  .  Potassium 05/16/2014 4.1  3.4 - 5.3 mmol/L Final  . Sodium 05/16/2014 138  137 - 147 mmol/L Final     Assessment/Plan  1. Chronic kidney disease, stage 3 (moderate) Routine lab follow-up in a few weeks  2. Dementia with behavioral disturbance Stable  3. Essential hypertension Controlled  4. Gastroesophageal reflux disease without esophagitis Asymptomatic on PPI  5. Insomnia Improved on current medication  6. Iron deficiency anemia -CBC, reticulocyte count

## 2014-05-27 DIAGNOSIS — D649 Anemia, unspecified: Secondary | ICD-10-CM | POA: Diagnosis not present

## 2014-05-27 LAB — CBC AND DIFFERENTIAL
HEMATOCRIT: 40 % — AB (ref 41–53)
HEMOGLOBIN: 13.1 g/dL — AB (ref 13.5–17.5)
Platelets: 249 10*3/uL (ref 150–399)
WBC: 8.1 10*3/mL

## 2014-05-31 ENCOUNTER — Non-Acute Institutional Stay (SKILLED_NURSING_FACILITY): Payer: Medicare Other | Admitting: Nurse Practitioner

## 2014-05-31 DIAGNOSIS — M5440 Lumbago with sciatica, unspecified side: Secondary | ICD-10-CM | POA: Diagnosis not present

## 2014-05-31 DIAGNOSIS — N183 Chronic kidney disease, stage 3 unspecified: Secondary | ICD-10-CM

## 2014-05-31 DIAGNOSIS — I10 Essential (primary) hypertension: Secondary | ICD-10-CM | POA: Diagnosis not present

## 2014-05-31 DIAGNOSIS — N400 Enlarged prostate without lower urinary tract symptoms: Secondary | ICD-10-CM

## 2014-05-31 DIAGNOSIS — K219 Gastro-esophageal reflux disease without esophagitis: Secondary | ICD-10-CM | POA: Diagnosis not present

## 2014-05-31 DIAGNOSIS — D509 Iron deficiency anemia, unspecified: Secondary | ICD-10-CM

## 2014-05-31 DIAGNOSIS — F0391 Unspecified dementia with behavioral disturbance: Secondary | ICD-10-CM

## 2014-05-31 DIAGNOSIS — F03918 Unspecified dementia, unspecified severity, with other behavioral disturbance: Secondary | ICD-10-CM

## 2014-05-31 DIAGNOSIS — F32A Depression, unspecified: Secondary | ICD-10-CM

## 2014-05-31 DIAGNOSIS — K5900 Constipation, unspecified: Secondary | ICD-10-CM | POA: Diagnosis not present

## 2014-05-31 DIAGNOSIS — F329 Major depressive disorder, single episode, unspecified: Secondary | ICD-10-CM

## 2014-05-31 DIAGNOSIS — E876 Hypokalemia: Secondary | ICD-10-CM | POA: Diagnosis not present

## 2014-05-31 NOTE — Progress Notes (Signed)
Patient ID: Vincent Potts, male   DOB: May 26, 1924, 81 y.o.   MRN: [id number]   Code Status: DNR  Allergies  Allergen Reactions  . Olanzapine Other (See Comments)    unknown  . Rivastigmine Nausea Only    Chief Complaint  Patient presents with  . Medical Management of Chronic Issues  . Acute Visit    anemia    HPI: Patient is a 81 y.o. male seen in the SNF at [facility] today for evaluation of anemia and other chronic medical conditions.  Problem List Items Addressed This Visit    Hypertension (Chronic)    Controlled on Amlodipine 5mg        GERD (gastroesophageal reflux disease) (Chronic)    09/11/13 consultant pharmacist: dc Omeprazole  stable. continue Zantac 75mg         Dementia with behavioral disturbance (Chronic)    gradual decline  in SNF and continue Namenda        Chronic kidney disease (Chronic)    Creatinine 1.88 07/05/13(baseline 1.5-2.0) 02/11/14 creatinine 2.00 05/16/14 creatinine 2.31       Back pain    Tylenol nightly and prn Tylenol 650mg  q4h and Oxycodone 5mg  q4h prn available to him. He stated his back pain travels and feels like pain in his stomach. -well managed pain.       Anemia - Primary    09/24/13 Iron 36, ferritin 34, TICB256, Fe Sat 14, Hgb 10.1, takes Iron 325mg  daily. IDA 10/25/13 Hgb 11.9 02/11/14 Hgb 10.7, Iron 40-continue Fe.  05/27/14 Hgb 13.1 dc Iron f/u CBC one month.       Depression    better on Effexor 75mg  and Mirtazapine 7.5mg  qhs. Sleeps well at night        Constipation    Managed with Senokot S II bid and MOM q 3days-stable.          BPH (benign prostatic hyperplasia)    Recurrent urinary retention-Foley inserted 11/04/12 at ED-successfully removed--continued Tamsulosin and Finasteride 11/06/13 c/o urinary frequency-UA C/S and US bladder scan.   11/07/13 US bladder: no significant postvoid residual volume is present.  11/08/13 Urine culture 40,000c/ml diphtheroids.   02/08/14 no urinary  retention  stable       Hypokalemia    Supplemented with Kcl 17meq daily.          Review of Systems:  Review of Systems  Constitutional: Negative for fever, chills and diaphoresis.  HENT: Positive for hearing loss. Negative for congestion, ear discharge, ear pain, nosebleeds, postnasal drip, rhinorrhea, sinus pressure, sore throat and tinnitus.        Nasal congestion at night  Eyes: Negative for photophobia, pain, discharge and redness.  Respiratory: Negative for cough, shortness of breath, wheezing and stridor.   Cardiovascular: Negative for chest pain, palpitations and leg swelling.  Gastrointestinal: Negative for nausea, vomiting, abdominal pain, diarrhea, constipation and blood in stool.  Endocrine: Negative for polydipsia.  Genitourinary: Positive for frequency. Negative for dysuria, urgency, hematuria and flank pain.  Musculoskeletal: Positive for back pain. Negative for myalgias and neck pain.  Skin: Negative for rash.  Allergic/Immunologic: Negative for environmental allergies.  Neurological: Negative for dizziness, tremors, seizures, weakness and headaches.  Hematological: Does not bruise/bleed easily.  Psychiatric/Behavioral: Negative for suicidal ideas and hallucinations. The patient is not nervous/anxious.      Past Medical History  Diagnosis Date  . Thyroid disease   . Anemia   . Vitamin D deficiency disease   . Hyponatremia   . Anxiety   .  Parkinsonism   . Hearing loss   . Hypertension   . PVC (premature ventricular contraction)   . CVA (cerebral infarction)   . GERD (gastroesophageal reflux disease)   . Diverticulosis   . Sciatica   . Back pain   . Gait disorder   . Cataract   . Edema   . Keratosis   . Sciatica   . Cystic disease of liver   . Paresthesia   . Inguinal hernia   . Hearing loss   . Stroke 2010    no deficits  . Chronic kidney disease 2013    rhabdo  . H/O hiatal hernia 2008    repaired  . Depression 06/06/2012  . Dementia  with behavioral disturbance 06/06/2012   Past Surgical History  Procedure Laterality Date  . Prostate surgery      cancer  . Hernia repair    . Appendectomy    . Esophagogastroduodenoscopy  10/25/2011    Procedure: ESOPHAGOGASTRODUODENOSCOPY (EGD);  Surgeon: Winfield Cunas., MD;  Location: [facility] ENDOSCOPY;  Service: Endoscopy;  Laterality: N/A;   Social History:   reports that he has never smoked. He does not have any smokeless tobacco history on file. He reports that he drinks alcohol. He reports that he does not use illicit drugs.  Family History  Problem Relation Age of Onset  . Hypertension Mother     Medications: Patient's Medications  New Prescriptions   No medications on file  Previous Medications   ACETAMINOPHEN (TYLENOL) 325 MG TABLET    Take 650 mg by mouth every 4 (four) hours as needed.   ACETAMINOPHEN (TYLENOL) 500 MG TABLET    Take 500 mg by mouth at bedtime. To be given with Restoril   AMLODIPINE (NORVASC) 5 MG TABLET    Take 1 tablet (5 mg total) by mouth every morning.   CARBOXYMETHYLCELLULOSE (REFRESH TEARS) 0.5 % SOLN    Place 1 drop into both eyes 3 (three) times daily.   CHOLECALCIFEROL (VITAMIN D) 1000 UNITS TABLET    Take 1,000 Units by mouth every morning.    CYCLOSPORINE (RESTASIS) 0.05 % OPHTHALMIC EMULSION    Place 1 drop into both eyes 2 (two) times daily.    FEEDING SUPPLEMENT (ENSURE CLINICAL STRENGTH) LIQD    Take 237 mLs by mouth. Take one can daily as needed   FINASTERIDE (PROSCAR) 5 MG TABLET    Take 5 mg by mouth daily.   HYPROMELLOSE (GENTEAL OP)    Apply 1 drop to eye at bedtime.   MAGNESIUM HYDROXIDE (MILK OF MAGNESIA) 400 MG/5ML SUSPENSION    Take 30 mLs by mouth every 3 (three) days. Please take 30 ml by mouth daily prn for constipation.   MELATONIN 3 MG TABS    Take 3 mg by mouth at bedtime. Take one additional at bedtime for sleep   MEMANTINE HCL ER (NAMENDA XR) 28 MG CP24    Take 28 mg by mouth. Take one daily for memory   MIRTAZAPINE  (REMERON) 7.5 MG TABLET    Take 7.5 mg by mouth at bedtime.   OXYCODONE (OXY-IR) 5 MG CAPSULE    Take 5 mg by mouth every 4 (four) hours as needed. pain   POTASSIUM CHLORIDE (K-DUR) 10 MEQ TABLET    Take 10 mEq by mouth daily.   RANITIDINE (ZANTAC) 150 MG TABLET    Take 150 mg by mouth at bedtime.   SENNA (SENOKOT) 8.6 MG TABLET    Take 2 tablets by mouth 2 (  two) times daily.    SODIUM FLUORIDE (PREVIDENT 5000 PLUS) 1.1 % CREA DENTAL CREAM    Place 1 application onto teeth every evening.   TAMSULOSIN HCL (FLOMAX) 0.4 MG CAPS    Take 0.4 mg by mouth every morning.    VENLAFAXINE (EFFEXOR) 75 MG TABLET    Take 75 mg by mouth daily.  Modified Medications   No medications on file  Discontinued Medications   FERROUS SULFATE 325 (65 FE) MG TABLET    Take 325 mg by mouth daily with breakfast.     Physical Exam: Physical Exam  Constitutional: He appears well-developed and well-nourished.  HENT:  Head: Normocephalic and atraumatic. Not macrocephalic.  Nose: No mucosal edema or rhinorrhea. Right sinus exhibits no maxillary sinus tenderness and no frontal sinus tenderness. Left sinus exhibits no maxillary sinus tenderness and no frontal sinus tenderness.  Mouth/Throat: Uvula is midline. Posterior oropharyngeal erythema present. No oropharyngeal exudate or posterior oropharyngeal edema.  Eyes: EOM are normal. Pupils are equal, round, and reactive to light.  Neck: Normal range of motion. Neck supple. No JVD present. No thyromegaly present.  Cardiovascular: Normal rate and regular rhythm.   Murmur heard.  Systolic murmur is present with a grade of 2/6  Pulmonary/Chest: Effort normal and breath sounds normal. He has no wheezes. He has no rales.  Abdominal: Soft. Bowel sounds are normal. There is no tenderness.  Musculoskeletal: Normal range of motion. He exhibits tenderness. He exhibits no edema.  Scoliokyphosis, walking with walker with SBA. Chronic back pain. Hx of s/p left 5th digit 3rd phalange  amputation.  Lymphadenopathy:    He has no cervical adenopathy.  Neurological: He is alert. He displays normal reflexes. No cranial nerve deficit. He exhibits normal muscle tone. Coordination normal.  Skin: Skin is warm and dry. No rash noted. No erythema.  AK top of head and right lower eyelid stye.    Psychiatric: His mood appears not anxious. His affect is not angry, not blunt and not labile. His speech is not delayed and not slurred. He is not agitated, not aggressive, not slowed, not withdrawn, not actively hallucinating and not combative. Thought content is not paranoid and not delusional. Cognition and memory are impaired. He expresses impulsivity. He does not exhibit a depressed mood. He exhibits abnormal recent memory. He is attentive.   Filed Vitals:   05/31/14 1225  BP: 124/68  Pulse: 76  Temp: 98.5 F (36.9 C)  TempSrc: Tympanic  Resp: 18      Labs reviewed: Basic Metabolic Panel:  Recent Labs  06/11/13  07/05/13 02/11/14 05/16/14  NA 135*  < > 138 135* 138  K 4.4  < > 3.9 4.3 4.1  BUN 26*  < > 27* 32* 34*  CREATININE 1.6*  < > 1.9* 2.0* 2.3*  TSH 1.04  --   --   --   --   < > = values in this interval not displayed. Liver Function Tests:  Recent Labs  02/11/14  AST 12*  ALT 8*  ALKPHOS 87   No results for input(s): LIPASE, AMYLASE in the last 8760 hours. No results for input(s): AMMONIA in the last 8760 hours. CBC:  Recent Labs  10/25/13 02/11/14 05/27/14  WBC 8.3 6.2 8.1  HGB 11.9* 10.7* 13.1*  HCT 36* 32* 40*  PLT 196 208 249   Lipid Panel: No results for input(s): CHOL, HDL, LDLCALC, TRIG, CHOLHDL, LDLDIRECT in the last 8760 hours.  Past Procedures:  None recently  Assessment/Plan Anemia  09/24/13 Iron 36, ferritin 34, TICB256, Fe Sat 14, Hgb 10.1, takes Iron 325mg  daily. IDA 10/25/13 Hgb 11.9 02/11/14 Hgb 10.7, Iron 40-continue Fe.  05/27/14 Hgb 13.1 dc Iron f/u CBC one month.    Constipation Managed with Senokot S II bid and MOM q  3days-stable.       BPH (benign prostatic hyperplasia) Recurrent urinary retention-Foley inserted 11/04/12 at ED-successfully removed--continued Tamsulosin and Finasteride 11/06/13 c/o urinary frequency-UA C/S and US bladder scan.   11/07/13 US bladder: no significant postvoid residual volume is present.  11/08/13 Urine culture 40,000c/ml diphtheroids.   02/08/14 no urinary retention  stable    Hypertension Controlled on Amlodipine 5mg     Chronic kidney disease Creatinine 1.88 07/05/13(baseline 1.5-2.0) 02/11/14 creatinine 2.00 05/16/14 creatinine 2.31    Hypokalemia Supplemented with Kcl 52meq daily.    Dementia with behavioral disturbance gradual decline  in SNF and continue Namenda     Depression better on Effexor 75mg  and Mirtazapine 7.5mg  qhs. Sleeps well at night     GERD (gastroesophageal reflux disease) 09/11/13 consultant pharmacist: dc Omeprazole  stable. continue Zantac 75mg      Back pain Tylenol nightly and prn Tylenol 650mg  q4h and Oxycodone 5mg  q4h prn available to him. He stated his back pain travels and feels like pain in his stomach. -well managed pain.      Family/ Staff Communication: observe the patient.   Goals of Care: SNF  Labs/tests ordered: CBC in one month.

## 2014-06-02 ENCOUNTER — Encounter: Payer: Self-pay | Admitting: Nurse Practitioner

## 2014-06-02 DIAGNOSIS — E876 Hypokalemia: Secondary | ICD-10-CM | POA: Insufficient documentation

## 2014-06-02 NOTE — Assessment & Plan Note (Signed)
09/11/13 consultant pharmacist: dc Omeprazole  stable. continue Zantac 75mg 

## 2014-06-02 NOTE — Assessment & Plan Note (Signed)
better on Effexor 75mg  and Mirtazapine 7.5mg  qhs. Sleeps well at night

## 2014-06-02 NOTE — Assessment & Plan Note (Signed)
Creatinine 1.88 07/05/13(baseline 1.5-2.0) 02/11/14 creatinine 2.00 05/16/14 creatinine 2.31

## 2014-06-02 NOTE — Assessment & Plan Note (Signed)
Tylenol nightly and prn Tylenol 650mg  q4h and Oxycodone 5mg  q4h prn available to him. He stated his back pain travels and feels like pain in his stomach. -well managed pain.

## 2014-06-02 NOTE — Assessment & Plan Note (Signed)
Managed with Senokot S II bid and MOM q 3days-stable.

## 2014-06-02 NOTE — Assessment & Plan Note (Signed)
Controlled on Amlodipine 5mg 

## 2014-06-02 NOTE — Assessment & Plan Note (Signed)
Recurrent urinary retention-Foley inserted 11/04/12 at ED-successfully removed--continued Tamsulosin and Finasteride 11/06/13 c/o urinary frequency-UA C/S and US bladder scan.   11/07/13 US bladder: no significant postvoid residual volume is present.  11/08/13 Urine culture 40,000c/ml diphtheroids.   02/08/14 no urinary retention  stable

## 2014-06-02 NOTE — Assessment & Plan Note (Signed)
09/24/13 Iron 36, ferritin 34, TICB256, Fe Sat 14, Hgb 10.1, takes Iron 325mg  daily. IDA 10/25/13 Hgb 11.9 02/11/14 Hgb 10.7, Iron 40-continue Fe.  05/27/14 Hgb 13.1 dc Iron f/u CBC one month.

## 2014-06-02 NOTE — Assessment & Plan Note (Signed)
gradual decline  in SNF and continue Namenda

## 2014-06-02 NOTE — Assessment & Plan Note (Signed)
Supplemented with Kcl 70meq daily.

## 2014-06-17 DIAGNOSIS — Z961 Presence of intraocular lens: Secondary | ICD-10-CM | POA: Diagnosis not present

## 2014-06-17 DIAGNOSIS — H1851 Endothelial corneal dystrophy: Secondary | ICD-10-CM | POA: Diagnosis not present

## 2014-07-01 DIAGNOSIS — D649 Anemia, unspecified: Secondary | ICD-10-CM | POA: Diagnosis not present

## 2014-07-16 ENCOUNTER — Encounter: Payer: Self-pay | Admitting: Nurse Practitioner

## 2014-07-16 ENCOUNTER — Non-Acute Institutional Stay (SKILLED_NURSING_FACILITY): Payer: Medicare Other | Admitting: Nurse Practitioner

## 2014-07-16 DIAGNOSIS — F329 Major depressive disorder, single episode, unspecified: Secondary | ICD-10-CM | POA: Diagnosis not present

## 2014-07-16 DIAGNOSIS — G4700 Insomnia, unspecified: Secondary | ICD-10-CM | POA: Diagnosis not present

## 2014-07-16 DIAGNOSIS — N189 Chronic kidney disease, unspecified: Secondary | ICD-10-CM | POA: Diagnosis not present

## 2014-07-16 DIAGNOSIS — F0391 Unspecified dementia with behavioral disturbance: Secondary | ICD-10-CM | POA: Diagnosis not present

## 2014-07-16 DIAGNOSIS — F03918 Unspecified dementia, unspecified severity, with other behavioral disturbance: Secondary | ICD-10-CM

## 2014-07-16 DIAGNOSIS — I10 Essential (primary) hypertension: Secondary | ICD-10-CM

## 2014-07-16 DIAGNOSIS — K219 Gastro-esophageal reflux disease without esophagitis: Secondary | ICD-10-CM | POA: Diagnosis not present

## 2014-07-16 DIAGNOSIS — M5440 Lumbago with sciatica, unspecified side: Secondary | ICD-10-CM | POA: Diagnosis not present

## 2014-07-16 DIAGNOSIS — N183 Chronic kidney disease, stage 3 unspecified: Secondary | ICD-10-CM

## 2014-07-16 DIAGNOSIS — D631 Anemia in chronic kidney disease: Secondary | ICD-10-CM

## 2014-07-16 DIAGNOSIS — K5900 Constipation, unspecified: Secondary | ICD-10-CM

## 2014-07-16 DIAGNOSIS — N400 Enlarged prostate without lower urinary tract symptoms: Secondary | ICD-10-CM | POA: Diagnosis not present

## 2014-07-16 DIAGNOSIS — E876 Hypokalemia: Secondary | ICD-10-CM

## 2014-07-16 DIAGNOSIS — F32A Depression, unspecified: Secondary | ICD-10-CM

## 2014-07-16 NOTE — Assessment & Plan Note (Signed)
radual decline  in SNF and continue Namenda

## 2014-07-16 NOTE — Assessment & Plan Note (Signed)
Controlled on Amlodipine 5mg 

## 2014-07-16 NOTE — Assessment & Plan Note (Signed)
better on Effexor 75mg  and Mirtazapine 7.5mg  qhs. Sleeps well at night

## 2014-07-16 NOTE — Assessment & Plan Note (Signed)
staff reported 07/01/13 the patient stated he slept for 1st time in my bed in weeks. Continue Mirtazapine 7.5mg  qhs. Improved.

## 2014-07-16 NOTE — Assessment & Plan Note (Signed)
Supplemented with Kcl 40meq daily. Update serum K level.

## 2014-07-16 NOTE — Assessment & Plan Note (Signed)
Tylenol nightly and prn Tylenol 650mg  q4h and no longer needing Oxycodone 5mg  q4h prn. He stated his back pain travels and feels like pain in his stomach. -well managed pain.

## 2014-07-16 NOTE — Assessment & Plan Note (Signed)
Managed with Senokot S II bid and MOM q 3days-stable.

## 2014-07-16 NOTE — Assessment & Plan Note (Signed)
Creatinine 1.88 07/05/13(baseline 1.5-2.0) 02/11/14 creatinine 2.00 05/16/14 creatinine 2.31

## 2014-07-16 NOTE — Assessment & Plan Note (Signed)
09/11/13 consultant pharmacist: dc Omeprazole  stable. continue Zantac 75mg 

## 2014-07-16 NOTE — Assessment & Plan Note (Signed)
Recurrent urinary retention-Foley inserted 11/04/12 at ED-successfully removed--continued Tamsulosin and Finasteride 11/06/13 c/o urinary frequency-UA C/S and US bladder scan.   11/07/13 US bladder: no significant postvoid residual volume is present.  11/08/13 Urine culture 40,000c/ml diphtheroids.   02/08/14 no urinary retention  stable

## 2014-07-16 NOTE — Progress Notes (Signed)
Patient ID: Vincent Potts, male   DOB: 16-Sep-1924, 81 y.o.   MRN: [id number]   Code Status: DNR  Allergies  Allergen Reactions  . Olanzapine Other (See Comments)    unknown  . Rivastigmine Nausea Only    Chief Complaint  Patient presents with  . Medical Management of Chronic Issues    HPI: Patient is a 81 y.o. male seen in the SNF at [facility] today for evaluation chronic medical conditions.  Problem List Items Addressed This Visit    Hypertension (Chronic)    Controlled on Amlodipine 5mg        GERD (gastroesophageal reflux disease) (Chronic)    09/11/13 consultant pharmacist: dc Omeprazole  stable. continue Zantac 75mg         Dementia with behavioral disturbance (Chronic)    radual decline  in SNF and continue Namenda         Chronic kidney disease (Chronic)    Creatinine 1.88 07/05/13(baseline 1.5-2.0) 02/11/14 creatinine 2.00 05/16/14 creatinine 2.31        Back pain - Primary    Tylenol nightly and prn Tylenol 650mg  q4h and no longer needing Oxycodone 5mg  q4h prn. He stated his back pain travels and feels like pain in his stomach. -well managed pain.        Anemia    09/24/13 Iron 36, ferritin 34, TICB256, Fe Sat 14, Hgb 10.1, takes Iron 325mg  daily. IDA 10/25/13 Hgb 11.9 02/11/14 Hgb 10.7, Iron 40-continue Fe.  05/27/14 Hgb 13.1 dc Iron f/u CBC one month.  07/16/14 update CBC       Depression    better on Effexor 75mg  and Mirtazapine 7.5mg  qhs. Sleeps well at night         Constipation    Managed with Senokot S II bid and MOM q 3days-stable.         BPH (benign prostatic hyperplasia)    Recurrent urinary retention-Foley inserted 11/04/12 at ED-successfully removed--continued Tamsulosin and Finasteride 11/06/13 c/o urinary frequency-UA C/S and US bladder scan.   11/07/13 US bladder: no significant postvoid residual volume is present.  11/08/13 Urine culture 40,000c/ml diphtheroids.   02/08/14 no urinary retention  stable        Insomnia    staff reported 07/01/13 the patient stated he slept for 1st time in my bed in weeks. Continue Mirtazapine 7.5mg  qhs. Improved.         Hypokalemia    Supplemented with Kcl 66meq daily. Update serum K level.           Review of Systems:  Review of Systems  Constitutional: Negative for fever, chills and diaphoresis.  HENT: Positive for hearing loss. Negative for congestion, ear discharge, ear pain, nosebleeds, postnasal drip, rhinorrhea, sinus pressure, sore throat and tinnitus.        Nasal congestion at night  Eyes: Negative for photophobia, pain, discharge and redness.  Respiratory: Negative for cough, shortness of breath, wheezing and stridor.   Cardiovascular: Negative for chest pain, palpitations and leg swelling.  Gastrointestinal: Negative for nausea, vomiting, abdominal pain, diarrhea, constipation and blood in stool.  Endocrine: Negative for polydipsia.  Genitourinary: Positive for frequency. Negative for dysuria, urgency, hematuria and flank pain.  Musculoskeletal: Positive for back pain. Negative for myalgias and neck pain.  Skin: Negative for rash.  Allergic/Immunologic: Negative for environmental allergies.  Neurological: Negative for dizziness, tremors, seizures, weakness and headaches.  Hematological: Does not bruise/bleed easily.  Psychiatric/Behavioral: Negative for suicidal ideas and hallucinations. The patient is not nervous/anxious.  Past Medical History  Diagnosis Date  . Thyroid disease   . Anemia   . Vitamin D deficiency disease   . Hyponatremia   . Anxiety   . Parkinsonism   . Hearing loss   . Hypertension   . PVC (premature ventricular contraction)   . CVA (cerebral infarction)   . GERD (gastroesophageal reflux disease)   . Diverticulosis   . Sciatica   . Back pain   . Gait disorder   . Cataract   . Edema   . Keratosis   . Sciatica   . Cystic disease of liver   . Paresthesia   . Inguinal hernia   . Hearing loss   .  Stroke 2010    no deficits  . Chronic kidney disease 2013    rhabdo  . H/O hiatal hernia 2008    repaired  . Depression 06/06/2012  . Dementia with behavioral disturbance 06/06/2012   Past Surgical History  Procedure Laterality Date  . Prostate surgery      cancer  . Hernia repair    . Appendectomy    . Esophagogastroduodenoscopy  10/25/2011    Procedure: ESOPHAGOGASTRODUODENOSCOPY (EGD);  Surgeon: Winfield Cunas., MD;  Location: [facility] ENDOSCOPY;  Service: Endoscopy;  Laterality: N/A;   Social History:   reports that he has never smoked. He does not have any smokeless tobacco history on file. He reports that he drinks alcohol. He reports that he does not use illicit drugs.  Family History  Problem Relation Age of Onset  . Hypertension Mother     Medications: Patient's Medications  New Prescriptions   No medications on file  Previous Medications   ACETAMINOPHEN (TYLENOL) 325 MG TABLET    Take 650 mg by mouth every 4 (four) hours as needed.   ACETAMINOPHEN (TYLENOL) 500 MG TABLET    Take 500 mg by mouth at bedtime. To be given with Restoril   AMLODIPINE (NORVASC) 5 MG TABLET    Take 1 tablet (5 mg total) by mouth every morning.   CARBOXYMETHYLCELLULOSE (REFRESH TEARS) 0.5 % SOLN    Place 1 drop into both eyes 3 (three) times daily.   CHOLECALCIFEROL (VITAMIN D) 1000 UNITS TABLET    Take 1,000 Units by mouth every morning.    CYCLOSPORINE (RESTASIS) 0.05 % OPHTHALMIC EMULSION    Place 1 drop into both eyes 2 (two) times daily.    FEEDING SUPPLEMENT (ENSURE CLINICAL STRENGTH) LIQD    Take 237 mLs by mouth. Take one can daily as needed   FINASTERIDE (PROSCAR) 5 MG TABLET    Take 5 mg by mouth daily.   HYPROMELLOSE (GENTEAL OP)    Apply 1 drop to eye at bedtime.   MAGNESIUM HYDROXIDE (MILK OF MAGNESIA) 400 MG/5ML SUSPENSION    Take 30 mLs by mouth every 3 (three) days. Please take 30 ml by mouth daily prn for constipation.   MELATONIN 3 MG TABS    Take 3 mg by mouth at bedtime. Take  one additional at bedtime for sleep   MEMANTINE HCL ER (NAMENDA XR) 28 MG CP24    Take 28 mg by mouth. Take one daily for memory   MIRTAZAPINE (REMERON) 7.5 MG TABLET    Take 7.5 mg by mouth at bedtime.   OXYCODONE (OXY-IR) 5 MG CAPSULE    Take 5 mg by mouth every 4 (four) hours as needed. pain   POTASSIUM CHLORIDE (K-DUR) 10 MEQ TABLET    Take 10 mEq by mouth daily.  RANITIDINE (ZANTAC) 150 MG TABLET    Take 150 mg by mouth at bedtime.   SENNA (SENOKOT) 8.6 MG TABLET    Take 2 tablets by mouth 2 (two) times daily.    SODIUM FLUORIDE (PREVIDENT 5000 PLUS) 1.1 % CREA DENTAL CREAM    Place 1 application onto teeth every evening.   TAMSULOSIN HCL (FLOMAX) 0.4 MG CAPS    Take 0.4 mg by mouth every morning.    VENLAFAXINE (EFFEXOR) 75 MG TABLET    Take 75 mg by mouth daily.  Modified Medications   No medications on file  Discontinued Medications   No medications on file     Physical Exam: Physical Exam  Constitutional: He appears well-developed and well-nourished.  HENT:  Head: Normocephalic and atraumatic. Not macrocephalic.  Nose: No mucosal edema or rhinorrhea. Right sinus exhibits no maxillary sinus tenderness and no frontal sinus tenderness. Left sinus exhibits no maxillary sinus tenderness and no frontal sinus tenderness.  Mouth/Throat: Uvula is midline. Posterior oropharyngeal erythema present. No oropharyngeal exudate or posterior oropharyngeal edema.  Eyes: EOM are normal. Pupils are equal, round, and reactive to light.  Neck: Normal range of motion. Neck supple. No JVD present. No thyromegaly present.  Cardiovascular: Normal rate and regular rhythm.   Murmur heard.  Systolic murmur is present with a grade of 2/6  Pulmonary/Chest: Effort normal and breath sounds normal. He has no wheezes. He has no rales.  Abdominal: Soft. Bowel sounds are normal. There is no tenderness.  Musculoskeletal: Normal range of motion. He exhibits tenderness. He exhibits no edema.  Scoliokyphosis,  walking with walker with SBA. Chronic back pain. 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  Lymphadenopathy:    He has no cervical adenopathy.  Neurological: He is alert. He displays normal reflexes. No cranial nerve deficit. He exhibits normal muscle tone. Coordination normal.  Skin: Skin is warm and dry. No rash noted. No erythema.  AK top of head and right lower eyelid stye.    Psychiatric: His mood appears not anxious. His affect is not angry, not blunt and not labile. His speech is not delayed and not slurred. He is not agitated, not aggressive, not slowed, not withdrawn, not actively hallucinating and not combative. Thought content is not paranoid and not delusional. Cognition and memory are impaired. He expresses impulsivity. He does not exhibit a depressed mood. He exhibits abnormal recent memory. He is attentive.   Filed Vitals:   07/16/14 1626  BP: 144/88  Pulse: 80  Temp: 98.6 F (37 C)  TempSrc: Tympanic  Resp: 20      Labs reviewed: Basic Metabolic Panel:  Recent Labs  02/11/14 05/16/14  NA 135* 138  K 4.3 4.1  BUN 32* 34*  CREATININE 2.0* 2.3*   Liver Function Tests:  Recent Labs  02/11/14  AST 12*  ALT 8*  ALKPHOS 87   No results for input(s): LIPASE, AMYLASE in the last 8760 hours. No results for input(s): AMMONIA in the last 8760 hours. CBC:  Recent Labs  10/25/13 02/11/14 05/27/14  WBC 8.3 6.2 8.1  HGB 11.9* 10.7* 13.1*  HCT 36* 32* 40*  PLT 196 208 249   Lipid Panel: No results for input(s): CHOL, HDL, LDLCALC, TRIG, CHOLHDL, LDLDIRECT in the last 8760 hours.  Past Procedures:  None recently  Assessment/Plan Back pain Tylenol nightly and prn Tylenol 650mg  q4h and no longer needing Oxycodone 5mg  q4h prn. He stated his back pain travels and feels like pain in his stomach. -  well managed pain.     Hypertension Controlled on Amlodipine 5mg     GERD (gastroesophageal reflux disease) 09/11/13 consultant pharmacist: dc Omeprazole    stable. continue Zantac 75mg      Dementia with behavioral disturbance radual decline  in SNF and continue Namenda      Chronic kidney disease Creatinine 1.88 07/05/13(baseline 1.5-2.0) 02/11/14 creatinine 2.00 05/16/14 creatinine 2.31     Anemia 09/24/13 Iron 36, ferritin 34, TICB256, Fe Sat 14, Hgb 10.1, takes Iron 325mg  daily. IDA 10/25/13 Hgb 11.9 02/11/14 Hgb 10.7, Iron 40-continue Fe.  05/27/14 Hgb 13.1 dc Iron f/u CBC one month.  07/16/14 update CBC    Depression better on Effexor 75mg  and Mirtazapine 7.5mg  qhs. Sleeps well at night      Constipation Managed with Senokot S II bid and MOM q 3days-stable.      BPH (benign prostatic hyperplasia) Recurrent urinary retention-Foley inserted 11/04/12 at ED-successfully removed--continued Tamsulosin and Finasteride 11/06/13 c/o urinary frequency-UA C/S and US bladder scan.   11/07/13 US bladder: no significant postvoid residual volume is present.  11/08/13 Urine culture 40,000c/ml diphtheroids.   02/08/14 no urinary retention  stable     Hypokalemia Supplemented with Kcl 25meq daily. Update serum K level.     Insomnia staff reported 07/01/13 the patient stated he slept for 1st time in my bed in weeks. Continue Mirtazapine 7.5mg  qhs. Improved.        Family/ Staff Communication: observe the patient.   Goals of Care: SNF  Labs/tests ordered: CBC and CMP

## 2014-07-16 NOTE — Assessment & Plan Note (Signed)
09/24/13 Iron 36, ferritin 34, TICB256, Fe Sat 14, Hgb 10.1, takes Iron 325mg  daily. IDA 10/25/13 Hgb 11.9 02/11/14 Hgb 10.7, Iron 40-continue Fe.  05/27/14 Hgb 13.1 dc Iron f/u CBC one month.  07/16/14 update CBC

## 2014-07-18 DIAGNOSIS — D649 Anemia, unspecified: Secondary | ICD-10-CM | POA: Diagnosis not present

## 2014-07-18 DIAGNOSIS — E860 Dehydration: Secondary | ICD-10-CM | POA: Diagnosis not present

## 2014-07-18 LAB — CBC AND DIFFERENTIAL
HCT: 37 % — AB (ref 41–53)
Hemoglobin: 12.4 g/dL — AB (ref 13.5–17.5)
PLATELETS: 223 10*3/uL (ref 150–399)
WBC: 7.2 10*3/mL

## 2014-07-18 LAB — BASIC METABOLIC PANEL
BUN: 30 mg/dL — AB (ref 4–21)
Creatinine: 2.2 mg/dL — AB (ref 0.6–1.3)
Glucose: 85 mg/dL
Potassium: 4.1 mmol/L (ref 3.4–5.3)
SODIUM: 137 mmol/L (ref 137–147)

## 2014-07-18 LAB — HEPATIC FUNCTION PANEL
ALT: 8 U/L — AB (ref 10–40)
AST: 12 U/L — AB (ref 14–40)
Alkaline Phosphatase: 82 U/L (ref 25–125)
Bilirubin, Total: 0.5 mg/dL

## 2014-07-19 ENCOUNTER — Other Ambulatory Visit: Payer: Self-pay | Admitting: Nurse Practitioner

## 2014-07-19 DIAGNOSIS — N183 Chronic kidney disease, stage 3 unspecified: Secondary | ICD-10-CM

## 2014-07-19 DIAGNOSIS — D509 Iron deficiency anemia, unspecified: Secondary | ICD-10-CM

## 2014-08-13 ENCOUNTER — Non-Acute Institutional Stay (SKILLED_NURSING_FACILITY): Payer: Medicare Other | Admitting: Nurse Practitioner

## 2014-08-13 ENCOUNTER — Encounter: Payer: Self-pay | Admitting: Nurse Practitioner

## 2014-08-13 DIAGNOSIS — K219 Gastro-esophageal reflux disease without esophagitis: Secondary | ICD-10-CM

## 2014-08-13 DIAGNOSIS — G4700 Insomnia, unspecified: Secondary | ICD-10-CM

## 2014-08-13 DIAGNOSIS — N183 Chronic kidney disease, stage 3 unspecified: Secondary | ICD-10-CM

## 2014-08-13 DIAGNOSIS — F03918 Unspecified dementia, unspecified severity, with other behavioral disturbance: Secondary | ICD-10-CM

## 2014-08-13 DIAGNOSIS — F0391 Unspecified dementia with behavioral disturbance: Secondary | ICD-10-CM | POA: Diagnosis not present

## 2014-08-13 DIAGNOSIS — F32A Depression, unspecified: Secondary | ICD-10-CM

## 2014-08-13 DIAGNOSIS — I10 Essential (primary) hypertension: Secondary | ICD-10-CM

## 2014-08-13 DIAGNOSIS — D509 Iron deficiency anemia, unspecified: Secondary | ICD-10-CM | POA: Diagnosis not present

## 2014-08-13 DIAGNOSIS — E876 Hypokalemia: Secondary | ICD-10-CM | POA: Diagnosis not present

## 2014-08-13 DIAGNOSIS — M5440 Lumbago with sciatica, unspecified side: Secondary | ICD-10-CM | POA: Diagnosis not present

## 2014-08-13 DIAGNOSIS — K5900 Constipation, unspecified: Secondary | ICD-10-CM

## 2014-08-13 DIAGNOSIS — N400 Enlarged prostate without lower urinary tract symptoms: Secondary | ICD-10-CM

## 2014-08-13 DIAGNOSIS — F329 Major depressive disorder, single episode, unspecified: Secondary | ICD-10-CM | POA: Diagnosis not present

## 2014-08-13 NOTE — Assessment & Plan Note (Signed)
Recurrent urinary retention-Foley inserted 11/04/12 at ED-successfully removed--continued Tamsulosin and Finasteride 11/06/13 c/o urinary frequency-UA C/S and US bladder scan.   11/07/13 US bladder: no significant postvoid residual volume is present.  11/08/13 Urine culture 40,000c/ml diphtheroids.   02/08/14 no urinary retention  stable

## 2014-08-13 NOTE — Assessment & Plan Note (Signed)
07/18/14 Hgb 12.4-off Iron

## 2014-08-13 NOTE — Assessment & Plan Note (Signed)
Managed with Senokot S II bid and MOM q 3days-stable.

## 2014-08-13 NOTE — Assessment & Plan Note (Signed)
05/16/14 creatinine 2.31 07/18/14 creatinine 2.22

## 2014-08-13 NOTE — Assessment & Plan Note (Signed)
better on Effexor 75mg  and Mirtazapine 7.5mg  qhs. Sleeps well at night

## 2014-08-13 NOTE — Assessment & Plan Note (Signed)
09/11/13 consultant pharmacist: dc Omeprazole  stable. continue Zantac 75mg 

## 2014-08-13 NOTE — Assessment & Plan Note (Signed)
staff reported 07/01/13 the patient stated he slept for 1st time in my bed in weeks. Continue Mirtazapine 7.5mg  qhs. Improved.

## 2014-08-13 NOTE — Assessment & Plan Note (Signed)
gradual decline  in SNF and continue Namenda

## 2014-08-13 NOTE — Assessment & Plan Note (Signed)
Tylenol nightly and prn Tylenol 650mg  q4h. He stated his back pain travels and feels like pain in his stomach. 07/16/14 dc prn Oxycodone-not used.  -well managed pain.

## 2014-08-13 NOTE — Assessment & Plan Note (Signed)
Supplemented with Kcl 66meq daily. 07/18/14 K 4.1

## 2014-08-13 NOTE — Progress Notes (Signed)
Patient ID: Vincent Potts, male   DOB: Jun 21, 1924, 81 y.o.   MRN: [id number]   Code Status: DNR  Allergies  Allergen Reactions  . Olanzapine Other (See Comments)    unknown  . Rivastigmine Nausea Only    Chief Complaint  Patient presents with  . Medical Management of Chronic Issues    HPI: Patient is a 81 y.o. male seen in the SNF at [facility] today for evaluation chronic medical conditions.  Problem List Items Addressed This Visit    Hypertension - Primary (Chronic)    Controlled on Amlodipine 5mg         GERD (gastroesophageal reflux disease) (Chronic)    09/11/13 consultant pharmacist: dc Omeprazole  stable. continue Zantac 75mg         Dementia with behavioral disturbance (Chronic)    gradual decline  in SNF and continue Namenda        Chronic kidney disease (Chronic)    05/16/14 creatinine 2.31 07/18/14 creatinine 2.22      Back pain    Tylenol nightly and prn Tylenol 650mg  q4h. He stated his back pain travels and feels like pain in his stomach. 07/16/14 dc prn Oxycodone-not used.  -well managed pain.         Anemia    07/18/14 Hgb 12.4-off Iron      Depression    better on Effexor 75mg  and Mirtazapine 7.5mg  qhs. Sleeps well at night        Constipation    Managed with Senokot S II bid and MOM q 3days-stable.         BPH (benign prostatic hyperplasia)    Recurrent urinary retention-Foley inserted 11/04/12 at ED-successfully removed--continued Tamsulosin and Finasteride 11/06/13 c/o urinary frequency-UA C/S and US bladder scan.   11/07/13 US bladder: no significant postvoid residual volume is present.  11/08/13 Urine culture 40,000c/ml diphtheroids.   02/08/14 no urinary retention  stable        Insomnia    staff reported 07/01/13 the patient stated he slept for 1st time in my bed in weeks. Continue Mirtazapine 7.5mg  qhs. Improved.          Hypokalemia    Supplemented with Kcl 52meq daily. 07/18/14 K 4.1           Review of  Systems:  Review of Systems  Constitutional: Negative for fever, chills and diaphoresis.  HENT: Positive for hearing loss. Negative for congestion, ear discharge, ear pain, nosebleeds, postnasal drip, rhinorrhea, sinus pressure, sore throat and tinnitus.        Nasal congestion at night  Eyes: Negative for photophobia, pain, discharge and redness.  Respiratory: Negative for cough, shortness of breath, wheezing and stridor.   Cardiovascular: Negative for chest pain, palpitations and leg swelling.  Gastrointestinal: Negative for nausea, vomiting, abdominal pain, diarrhea, constipation and blood in stool.  Endocrine: Negative for polydipsia.  Genitourinary: Positive for frequency. Negative for dysuria, urgency, hematuria and flank pain.  Musculoskeletal: Positive for back pain. Negative for myalgias and neck pain.  Skin: Negative for rash.  Allergic/Immunologic: Negative for environmental allergies.  Neurological: Negative for dizziness, tremors, seizures, weakness and headaches.  Hematological: Does not bruise/bleed easily.  Psychiatric/Behavioral: Negative for suicidal ideas and hallucinations. The patient is not nervous/anxious.      Past Medical History  Diagnosis Date  . Thyroid disease   . Anemia   . Vitamin D deficiency disease   . Hyponatremia   . Anxiety   . Parkinsonism   . Hearing loss   .  Hypertension   . PVC (premature ventricular contraction)   . CVA (cerebral infarction)   . GERD (gastroesophageal reflux disease)   . Diverticulosis   . Sciatica   . Back pain   . Gait disorder   . Cataract   . Edema   . Keratosis   . Sciatica   . Cystic disease of liver   . Paresthesia   . Inguinal hernia   . Hearing loss   . Stroke 2010    no deficits  . Chronic kidney disease 2013    rhabdo  . H/O hiatal hernia 2008    repaired  . Depression 06/06/2012  . Dementia with behavioral disturbance 06/06/2012   Past Surgical History  Procedure Laterality Date  . Prostate  surgery      cancer  . Hernia repair    . Appendectomy    . Esophagogastroduodenoscopy  10/25/2011    Procedure: ESOPHAGOGASTRODUODENOSCOPY (EGD);  Surgeon: Winfield Cunas., MD;  Location: [facility] ENDOSCOPY;  Service: Endoscopy;  Laterality: N/A;   Social History:   reports that he has never smoked. He does not have any smokeless tobacco history on file. He reports that he drinks alcohol. He reports that he does not use illicit drugs.  Family History  Problem Relation Age of Onset  . Hypertension Mother     Medications: Patient's Medications  New Prescriptions   No medications on file  Previous Medications   ACETAMINOPHEN (TYLENOL) 325 MG TABLET    Take 650 mg by mouth every 4 (four) hours as needed.   ACETAMINOPHEN (TYLENOL) 500 MG TABLET    Take 500 mg by mouth at bedtime. To be given with Restoril   AMLODIPINE (NORVASC) 5 MG TABLET    Take 1 tablet (5 mg total) by mouth every morning.   CARBOXYMETHYLCELLULOSE (REFRESH TEARS) 0.5 % SOLN    Place 1 drop into both eyes 3 (three) times daily.   CHOLECALCIFEROL (VITAMIN D) 1000 UNITS TABLET    Take 1,000 Units by mouth every morning.    CYCLOSPORINE (RESTASIS) 0.05 % OPHTHALMIC EMULSION    Place 1 drop into both eyes 2 (two) times daily.    FEEDING SUPPLEMENT (ENSURE CLINICAL STRENGTH) LIQD    Take 237 mLs by mouth. Take one can daily as needed   FINASTERIDE (PROSCAR) 5 MG TABLET    Take 5 mg by mouth daily.   HYPROMELLOSE (GENTEAL OP)    Apply 1 drop to eye at bedtime.   MAGNESIUM HYDROXIDE (MILK OF MAGNESIA) 400 MG/5ML SUSPENSION    Take 30 mLs by mouth every 3 (three) days. Please take 30 ml by mouth daily prn for constipation.   MELATONIN 3 MG TABS    Take 3 mg by mouth at bedtime. Take one additional at bedtime for sleep   MEMANTINE HCL ER (NAMENDA XR) 28 MG CP24    Take 28 mg by mouth. Take one daily for memory   MIRTAZAPINE (REMERON) 7.5 MG TABLET    Take 7.5 mg by mouth at bedtime.   OXYCODONE (OXY-IR) 5 MG CAPSULE    Take 5 mg  by mouth every 4 (four) hours as needed. pain   POTASSIUM CHLORIDE (K-DUR) 10 MEQ TABLET    Take 10 mEq by mouth daily.   RANITIDINE (ZANTAC) 150 MG TABLET    Take 150 mg by mouth at bedtime.   SENNA (SENOKOT) 8.6 MG TABLET    Take 2 tablets by mouth 2 (two) times daily.    SODIUM FLUORIDE (PREVIDENT  5000 PLUS) 1.1 % CREA DENTAL CREAM    Place 1 application onto teeth every evening.   TAMSULOSIN HCL (FLOMAX) 0.4 MG CAPS    Take 0.4 mg by mouth every morning.    VENLAFAXINE (EFFEXOR) 75 MG TABLET    Take 75 mg by mouth daily.  Modified Medications   No medications on file  Discontinued Medications   No medications on file     Physical Exam: Physical Exam  Constitutional: He appears well-developed and well-nourished.  HENT:  Head: Normocephalic and atraumatic. Not macrocephalic.  Nose: No mucosal edema or rhinorrhea. Right sinus exhibits no maxillary sinus tenderness and no frontal sinus tenderness. Left sinus exhibits no maxillary sinus tenderness and no frontal sinus tenderness.  Mouth/Throat: Uvula is midline. Posterior oropharyngeal erythema present. No oropharyngeal exudate or posterior oropharyngeal edema.  Eyes: EOM are normal. Pupils are equal, round, and reactive to light.  Neck: Normal range of motion. Neck supple. No JVD present. No thyromegaly present.  Cardiovascular: Normal rate and regular rhythm.   Murmur heard.  Systolic murmur is present with a grade of 2/6  Pulmonary/Chest: Effort normal and breath sounds normal. He has no wheezes. He has no rales.  Abdominal: Soft. Bowel sounds are normal. There is no tenderness.  Musculoskeletal: Normal range of motion. He exhibits tenderness. He exhibits no edema.  Scoliokyphosis, walking with walker with SBA. Chronic back pain. 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  Lymphadenopathy:    He has no cervical adenopathy.  Neurological: He is alert. He displays normal reflexes. No cranial nerve deficit. He exhibits normal  muscle tone. Coordination normal.  Skin: Skin is warm and dry. No rash noted. No erythema.  AK top of head and right lower eyelid stye.    Psychiatric: His mood appears not anxious. His affect is not angry, not blunt and not labile. His speech is not delayed and not slurred. He is not agitated, not aggressive, not slowed, not withdrawn, not actively hallucinating and not combative. Thought content is not paranoid and not delusional. Cognition and memory are impaired. He expresses impulsivity. He does not exhibit a depressed mood. He exhibits abnormal recent memory. He is attentive.   Filed Vitals:   08/13/14 1348  BP: 129/84  Pulse: 69  Temp: 98 F (36.7 C)  TempSrc: Tympanic  Resp: 20      Labs reviewed: Basic Metabolic Panel:  Recent Labs  02/11/14 05/16/14 07/18/14  NA 135* 138 137  K 4.3 4.1 4.1  BUN 32* 34* 30*  CREATININE 2.0* 2.3* 2.2*   Liver Function Tests:  Recent Labs  02/11/14 07/18/14  AST 12* 12*  ALT 8* 8*  ALKPHOS 87 82   No results for input(s): LIPASE, AMYLASE in the last 8760 hours. No results for input(s): AMMONIA in the last 8760 hours. CBC:  Recent Labs  02/11/14 05/27/14 07/18/14  WBC 6.2 8.1 7.2  HGB 10.7* 13.1* 12.4*  HCT 32* 40* 37*  PLT 208 249 223   Lipid Panel: No results for input(s): CHOL, HDL, LDLCALC, TRIG, CHOLHDL, LDLDIRECT in the last 8760 hours.  Past Procedures:  None recently  Assessment/Plan Hypertension Controlled on Amlodipine 5mg     GERD (gastroesophageal reflux disease) 09/11/13 consultant pharmacist: dc Omeprazole  stable. continue Zantac 75mg     Dementia with behavioral disturbance gradual decline  in SNF and continue Namenda    Chronic kidney disease 05/16/14 creatinine 2.31 07/18/14 creatinine 2.22  Back pain Tylenol nightly and prn Tylenol 650mg  q4h. He stated  his back pain travels and feels like pain in his stomach. 07/16/14 dc prn Oxycodone-not used.  -well managed pain.      Anemia 07/18/14 Hgb 12.4-off Iron  Depression better on Effexor 75mg  and Mirtazapine 7.5mg  qhs. Sleeps well at night    Constipation Managed with Senokot S II bid and MOM q 3days-stable.     BPH (benign prostatic hyperplasia) Recurrent urinary retention-Foley inserted 11/04/12 at ED-successfully removed--continued Tamsulosin and Finasteride 11/06/13 c/o urinary frequency-UA C/S and US bladder scan.   11/07/13 US bladder: no significant postvoid residual volume is present.  11/08/13 Urine culture 40,000c/ml diphtheroids.   02/08/14 no urinary retention  stable    Insomnia staff reported 07/01/13 the patient stated he slept for 1st time in my bed in weeks. Continue Mirtazapine 7.5mg  qhs. Improved.      Hypokalemia Supplemented with Kcl 29meq daily. 07/18/14 K 4.1      Family/ Staff Communication: observe the patient.   Goals of Care: SNF  Labs/tests ordered: none

## 2014-08-13 NOTE — Assessment & Plan Note (Signed)
Controlled on Amlodipine 5mg 

## 2014-09-12 ENCOUNTER — Non-Acute Institutional Stay (SKILLED_NURSING_FACILITY): Payer: Medicare Other | Admitting: Internal Medicine

## 2014-09-12 ENCOUNTER — Encounter: Payer: Self-pay | Admitting: Internal Medicine

## 2014-09-12 DIAGNOSIS — F03918 Unspecified dementia, unspecified severity, with other behavioral disturbance: Secondary | ICD-10-CM

## 2014-09-12 DIAGNOSIS — N400 Enlarged prostate without lower urinary tract symptoms: Secondary | ICD-10-CM

## 2014-09-12 DIAGNOSIS — F0391 Unspecified dementia with behavioral disturbance: Secondary | ICD-10-CM

## 2014-09-12 DIAGNOSIS — N183 Chronic kidney disease, stage 3 unspecified: Secondary | ICD-10-CM

## 2014-09-12 DIAGNOSIS — K219 Gastro-esophageal reflux disease without esophagitis: Secondary | ICD-10-CM

## 2014-09-12 DIAGNOSIS — I10 Essential (primary) hypertension: Secondary | ICD-10-CM | POA: Diagnosis not present

## 2014-09-12 NOTE — Progress Notes (Signed)
Patient ID: Vincent Potts, male   DOB: February 23, 1925, 81 y.o.   MRN: [id number]    HISTORY AND PHYSICAL  Location:  [facility] Room Number: N 8 Place of Service: SNF (31)   Extended Emergency Contact Information Primary Emergency Contact: [place] [address] [place] Phone: [phone] Relation: Nephew  Advanced Directive information Does patient have an advance directive?: Yes, Type of Advance Directive: Healthcare Power of [place];Living will;Out of facility DNR (pink MOST or yellow form), Pre-existing out of facility DNR order (yellow form or pink MOST form): Pink MOST form placed in chart (order not valid for inpatient use);Yellow form placed in chart (order not valid for inpatient use), Does patient want to make changes to advanced directive?: No - Patient declined  Chief Complaint  Patient presents with  . Medical Management of Chronic Issues    HPI:  Patient feels it is doing well at the present time. He has no specific problems today.  Chronic kidney disease, stage 3 (moderate): Stable  Dementia with behavioral disturbance: Unchanged over the last year. Seems much calmer in behaviors have improved.  Gastroesophageal reflux disease without esophagitis: Denies reflux or heartburn.  Essential hypertension: Controlled  BPH (benign prostatic hyperplasia): Stable on current medications, Proscar and tamsulosin    Past Medical History  Diagnosis Date  . Thyroid disease   . Anemia   . Vitamin D deficiency disease   . Hyponatremia   . Anxiety   . Parkinsonism   . Hearing loss   . Hypertension   . PVC (premature ventricular contraction)   . CVA (cerebral infarction)   . GERD (gastroesophageal reflux disease)   . Diverticulosis   . Sciatica   . Back pain   . Gait disorder   . Cataract   . Edema   . Keratosis   . Sciatica   . Cystic disease of liver   . Paresthesia   . Inguinal hernia   . Hearing loss     . Stroke 2010    no deficits  . Chronic kidney disease 2013    rhabdo  . H/O hiatal hernia 2008    repaired  . Depression 06/06/2012  . Dementia with behavioral disturbance 06/06/2012    Past Surgical History  Procedure Laterality Date  . Prostate surgery      cancer  . Hernia repair    . Appendectomy    . Esophagogastroduodenoscopy  10/25/2011    Procedure: ESOPHAGOGASTRODUODENOSCOPY (EGD);  Surgeon: Winfield Cunas., MD;  Location: [facility] ENDOSCOPY;  Service: Endoscopy;  Laterality: N/A;    Patient Care Team: Estill Dooms, MD as PCP - General (Internal Medicine)  History   Social History  . Marital Status: Single    Spouse Name: N/A  . Number of Children: N/A  . Years of Education: N/A   Occupational History  . Not on file.   Social History Main Topics  . Smoking status: Never Smoker   . Smokeless tobacco: Not on file  . Alcohol Use: Yes     Comment: occasionaly  . Drug Use: No  . Sexual Activity: No   Other Topics Concern  . Not on file   Social History Narrative     reports that he has never smoked. He does not have any smokeless tobacco history on file. He reports that he drinks alcohol. He reports that he does not use illicit drugs.  Family  History  Problem Relation Age of Onset  . Hypertension Mother    Family Status  Relation Status Death Age  . Mother Deceased   . Father Deceased   . Sister Deceased   . Brother Deceased     Immunization History  Administered Date(s) Administered  . Influenza Whole 12/20/2012  . Influenza-Unspecified 12/06/2013  . Tdap 10/24/2011    Allergies  Allergen Reactions  . Olanzapine Other (See Comments)    unknown  . Rivastigmine Nausea Only    Medications: Patient's Medications  New Prescriptions   No medications on file  Previous Medications   ACETAMINOPHEN (TYLENOL) 325 MG TABLET    Take 650 mg by mouth every 4 (four) hours as needed.   ACETAMINOPHEN (TYLENOL) 500 MG TABLET    Take 500 mg by mouth at  bedtime. To be given with Restoril   AMLODIPINE (NORVASC) 5 MG TABLET    Take 1 tablet (5 mg total) by mouth every morning.   CARBOXYMETHYLCELLULOSE (REFRESH TEARS) 0.5 % SOLN    Place 1 drop into both eyes 3 (three) times daily.   CHOLECALCIFEROL (VITAMIN D) 1000 UNITS TABLET    Take 1,000 Units by mouth every morning.    CYCLOSPORINE (RESTASIS) 0.05 % OPHTHALMIC EMULSION    Place 1 drop into both eyes 2 (two) times daily.    FEEDING SUPPLEMENT (ENSURE CLINICAL STRENGTH) LIQD    Take 237 mLs by mouth. Take one can daily as needed   FINASTERIDE (PROSCAR) 5 MG TABLET    Take 5 mg by mouth daily.   HYPROMELLOSE (GENTEAL OP)    Apply 1 drop to eye at bedtime.   MAGNESIUM HYDROXIDE (MILK OF MAGNESIA) 400 MG/5ML SUSPENSION    Take 30 mLs by mouth every 3 (three) days. Please take 30 ml by mouth daily prn for constipation.   MELATONIN 3 MG TABS    Take 3 mg by mouth at bedtime. Take one additional at bedtime for sleep   MEMANTINE HCL ER (NAMENDA XR) 28 MG CP24    Take 28 mg by mouth. Take one daily for memory   MIRTAZAPINE (REMERON) 7.5 MG TABLET    Take 7.5 mg by mouth at bedtime.   OXYCODONE (OXY-IR) 5 MG CAPSULE    Take 5 mg by mouth every 4 (four) hours as needed. pain   POTASSIUM CHLORIDE (K-DUR) 10 MEQ TABLET    Take 10 mEq by mouth daily.   RANITIDINE (ZANTAC) 150 MG TABLET    Take 150 mg by mouth at bedtime.   SENNA (SENOKOT) 8.6 MG TABLET    Take 2 tablets by mouth 2 (two) times daily.    SODIUM FLUORIDE (PREVIDENT 5000 PLUS) 1.1 % CREA DENTAL CREAM    Place 1 application onto teeth every evening.   TAMSULOSIN HCL (FLOMAX) 0.4 MG CAPS    Take 0.4 mg by mouth every morning.    VENLAFAXINE (EFFEXOR) 75 MG TABLET    Take 75 mg by mouth daily.  Modified Medications   No medications on file  Discontinued Medications   No medications on file    Review of Systems  Constitutional: Negative for fever, chills and diaphoresis.  HENT: Positive for congestion, hearing loss and sinus pressure.  Negative for ear discharge, ear pain, nosebleeds, postnasal drip, rhinorrhea, sore throat and tinnitus.        Nasal congestion at night  Eyes: Negative for photophobia, pain, discharge and redness.  Respiratory: Negative for cough, shortness of breath, wheezing and stridor.  Cardiovascular: Negative for chest pain, palpitations and leg swelling.  Gastrointestinal: Negative for nausea, vomiting, abdominal pain, diarrhea, constipation and blood in stool.  Endocrine: Negative for polydipsia.  Genitourinary: Positive for frequency. Negative for dysuria, urgency, hematuria and flank pain.  Musculoskeletal: Positive for back pain. Negative for myalgias and neck pain.  Skin: Negative for rash.  Allergic/Immunologic: Negative for environmental allergies.  Neurological: Negative for dizziness, tremors, seizures, weakness and headaches.  Hematological: Does not bruise/bleed easily.  Psychiatric/Behavioral: Negative for suicidal ideas and hallucinations. The patient is not nervous/anxious.     Filed Vitals:   09/12/14 1609  BP: 131/80  Pulse: 76  Temp: 98.2 F (36.8 C)  Resp: 18  Height: 5' (1.524 m)  Weight: 145 lb (65.772 kg)  SpO2: 92%   Body mass index is 28.32 kg/(m^2).  Physical Exam  Constitutional: He appears well-developed and well-nourished.  HENT:  Head: Normocephalic and atraumatic. Not macrocephalic.  Nose: No mucosal edema or rhinorrhea. Right sinus exhibits no maxillary sinus tenderness and no frontal sinus tenderness. Left sinus exhibits no maxillary sinus tenderness and no frontal sinus tenderness.  Mouth/Throat: Uvula is midline. Posterior oropharyngeal erythema present. No oropharyngeal exudate or posterior oropharyngeal edema.  Eyes: EOM are normal. Pupils are equal, round, and reactive to light.  Neck: Normal range of motion. Neck supple. No JVD present. No thyromegaly present.  Cardiovascular: Normal rate and regular rhythm.   Murmur heard.  Systolic murmur is  present with a grade of 2/6  Pulmonary/Chest: Effort normal and breath sounds normal. He has no wheezes. He has no rales.  Abdominal: Soft. Bowel sounds are normal. There is no tenderness.  Musculoskeletal: Normal range of motion. He exhibits tenderness. He exhibits no edema.  Scoliokyphosis, walking with walker with SBA. Chronic back pain. 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  Lymphadenopathy:    He has no cervical adenopathy.  Neurological: He is alert. He displays normal reflexes. No cranial nerve deficit. He exhibits normal muscle tone. Coordination normal.  Skin: Skin is warm and dry. No rash noted. No erythema.  AK top of head and right lower eyelid stye.    Psychiatric: His mood appears not anxious. His affect is not angry, not blunt and not labile. His speech is not delayed and not slurred. He is not agitated, not aggressive, not slowed, not withdrawn, not actively hallucinating and not combative. Thought content is not paranoid and not delusional. Cognition and memory are impaired. He expresses impulsivity. He does not exhibit a depressed mood. He exhibits abnormal recent memory. He is attentive.     Labs reviewed: Lab on 07/19/2014  Component Date Value Ref Range Status  . Hemoglobin 07/18/2014 12.4* 13.5 - 17.5 g/dL Final  . HCT 07/18/2014 37* 41 - 53 % Final  . Platelets 07/18/2014 223  150 - 399 K/L Final  . WBC 07/18/2014 7.2   Final  . Glucose 07/18/2014 85   Final  . BUN 07/18/2014 30* 4 - 21 mg/dL Final  . Creatinine 07/18/2014 2.2* 0.6 - 1.3 mg/dL Final  . Potassium 07/18/2014 4.1  3.4 - 5.3 mmol/L Final  . Sodium 07/18/2014 137  137 - 147 mmol/L Final  . Alkaline Phosphatase 07/18/2014 82  25 - 125 U/L Final  . ALT 07/18/2014 8* 10 - 40 U/L Final  . AST 07/18/2014 12* 14 - 40 U/L Final  . Bilirubin, Total 07/18/2014 0.5   Final    No results found.   Assessment/Plan  1. Chronic kidney disease,  stage 3 (moderate) BUN 30 and creatinine 2.22 when  last checked 07/18/2014  2. Dementia with behavioral disturbance Unchanged except for improvement in behaviors  3. Gastroesophageal reflux disease without esophagitis Asymptomatic  4. Essential hypertension Controlled  5. BPH (benign prostatic hyperplasia) Voiding reasonably well

## 2014-11-01 ENCOUNTER — Non-Acute Institutional Stay (SKILLED_NURSING_FACILITY): Payer: Medicare Other | Admitting: Nurse Practitioner

## 2014-11-01 ENCOUNTER — Encounter: Payer: Self-pay | Admitting: Nurse Practitioner

## 2014-11-01 DIAGNOSIS — N183 Chronic kidney disease, stage 3 unspecified: Secondary | ICD-10-CM

## 2014-11-01 DIAGNOSIS — I10 Essential (primary) hypertension: Secondary | ICD-10-CM | POA: Diagnosis not present

## 2014-11-01 DIAGNOSIS — F03918 Unspecified dementia, unspecified severity, with other behavioral disturbance: Secondary | ICD-10-CM

## 2014-11-01 DIAGNOSIS — F0391 Unspecified dementia with behavioral disturbance: Secondary | ICD-10-CM | POA: Diagnosis not present

## 2014-11-01 DIAGNOSIS — K219 Gastro-esophageal reflux disease without esophagitis: Secondary | ICD-10-CM | POA: Diagnosis not present

## 2014-11-01 DIAGNOSIS — N400 Enlarged prostate without lower urinary tract symptoms: Secondary | ICD-10-CM | POA: Diagnosis not present

## 2014-11-01 DIAGNOSIS — M5440 Lumbago with sciatica, unspecified side: Secondary | ICD-10-CM

## 2014-11-01 NOTE — Assessment & Plan Note (Signed)
Last urinary retention, Foley inserted 11/04/12 at ED-successfully removed--continued Tamsulosin and Finasteride 

## 2014-11-01 NOTE — Assessment & Plan Note (Signed)
Back pain, controlled on Tylenol, had Oxycodone use in the past for flare up pain.

## 2014-11-01 NOTE — Assessment & Plan Note (Signed)
Controlled, continue Amlodipine 5mg  

## 2014-11-01 NOTE — Assessment & Plan Note (Signed)
Stable to be off Omeprazole,  Continue Zantac.

## 2014-11-01 NOTE — Assessment & Plan Note (Signed)
Functioning well in SNF, stays in his room most of time, ambulates with walker, continue Namenda 

## 2014-11-01 NOTE — Assessment & Plan Note (Signed)
Creatinine 2s, chronic

## 2014-11-01 NOTE — Progress Notes (Signed)
Patient ID: Vincent Potts, male   DOB: [birth year], 81 y.o.   MRN: [id number]  Location:  SNF [facility] Provider:  Marlana Latus NP  Code Status:  DNR Goals of care: Advanced Directive information    Chief Complaint  Patient presents with  . Medical Management of Chronic Issues     HPI: Patient is a 81 y.o. male seen in the SNF at [facility] today for evaluation of  chronic medical conditions. Hx of HTN controlled while on Amlodipine 5mg , his GERD is well controlled on Zantac, able to wean off Omeprazole, dementia has been stabilized on Namenda, resides in SNF for care needs. His chronic kidney disease has been stable with creat 2s. He is forgetful, but able to voice his needs, taking Namenda, resides in SNF for care needs. Hx of BPH with recurrent urinary retention is the past, last episode was about 2 years ago, taking Tamsulosin and Finasteride.   Review of Systems:  Review of Systems  Constitutional: Negative for fever and chills.  HENT: Positive for hearing loss. Negative for congestion, ear pain and nosebleeds.   Eyes: Negative for pain, discharge and redness.  Respiratory: Negative for cough, shortness of breath, wheezing and stridor.   Cardiovascular: Negative for chest pain, palpitations and leg swelling.  Gastrointestinal: Negative for nausea, vomiting, abdominal pain, diarrhea and constipation.  Genitourinary: Positive for frequency. Negative for dysuria, urgency and flank pain.  Musculoskeletal: Positive for back pain. Negative for myalgias and neck pain.  Skin: Negative for rash.  Neurological: Negative for dizziness, tremors, seizures, weakness and headaches.  Psychiatric/Behavioral: Positive for memory loss. The patient is not nervous/anxious and does not have insomnia.     Past Medical History  Diagnosis Date  . Thyroid disease   . Anemia   . Vitamin D deficiency disease   . Hyponatremia   . Anxiety   . Parkinsonism   . Hearing loss   . Hypertension   . PVC  (premature ventricular contraction)   . CVA (cerebral infarction)   . GERD (gastroesophageal reflux disease)   . Diverticulosis   . Sciatica   . Back pain   . Gait disorder   . Cataract   . Edema   . Keratosis   . Sciatica   . Cystic disease of liver   . Paresthesia   . Inguinal hernia   . Hearing loss   . Stroke 2010    no deficits  . Chronic kidney disease 2013    rhabdo  . H/O hiatal hernia 2008    repaired  . Depression 06/06/2012  . Dementia with behavioral disturbance 06/06/2012    Patient Active Problem List   Diagnosis Date Noted  . AK (actinic keratosis) 03/19/2014  . Sinusitis, chronic 01/08/2014  . Insomnia 06/08/2013  . Chronic kidney disease 08/29/2012  . Depression 06/06/2012  . Dementia with behavioral disturbance 06/06/2012  . Constipation 06/06/2012  . BPH (benign prostatic hyperplasia) 06/06/2012  . Nephrolithiasis 10/25/2011  . Fall 04/19/2011  . Parkinsonism   . CVA (cerebral infarction)   . Hypertension   . Back pain   . Hearing loss   . Gait disorder   . Diverticulosis   . GERD (gastroesophageal reflux disease)     Allergies  Allergen Reactions  . Olanzapine Other (See Comments)    unknown  . Rivastigmine Nausea Only    Medications: Patient's Medications  New Prescriptions   No medications on file  Previous Medications   ACETAMINOPHEN (TYLENOL) 325 MG TABLET  Take 650 mg by mouth every 4 (four) hours as needed.   ACETAMINOPHEN (TYLENOL) 500 MG TABLET    Take 500 mg by mouth at bedtime. To be given with Restoril   AMLODIPINE (NORVASC) 5 MG TABLET    Take 1 tablet (5 mg total) by mouth every morning.   CARBOXYMETHYLCELLULOSE (REFRESH TEARS) 0.5 % SOLN    Place 1 drop into both eyes 3 (three) times daily.   CHOLECALCIFEROL (VITAMIN D) 1000 UNITS TABLET    Take 1,000 Units by mouth every morning.    CYCLOSPORINE (RESTASIS) 0.05 % OPHTHALMIC EMULSION    Place 1 drop into both eyes 2 (two) times daily.    FEEDING SUPPLEMENT (ENSURE  CLINICAL STRENGTH) LIQD    Take 237 mLs by mouth. Take one can daily as needed   FINASTERIDE (PROSCAR) 5 MG TABLET    Take 5 mg by mouth daily.   HYPROMELLOSE (GENTEAL OP)    Apply 1 drop to eye at bedtime.   MAGNESIUM HYDROXIDE (MILK OF MAGNESIA) 400 MG/5ML SUSPENSION    Take 30 mLs by mouth every 3 (three) days. Please take 30 ml by mouth daily prn for constipation.   MELATONIN 3 MG TABS    Take 3 mg by mouth at bedtime. Take one additional at bedtime for sleep   MEMANTINE HCL ER (NAMENDA XR) 28 MG CP24    Take 28 mg by mouth. Take one daily for memory   MIRTAZAPINE (REMERON) 7.5 MG TABLET    Take 7.5 mg by mouth at bedtime.   OXYCODONE (OXY-IR) 5 MG CAPSULE    Take 5 mg by mouth every 4 (four) hours as needed. pain   POTASSIUM CHLORIDE (K-DUR) 10 MEQ TABLET    Take 10 mEq by mouth daily.   RANITIDINE (ZANTAC) 150 MG TABLET    Take 150 mg by mouth at bedtime.   SENNA (SENOKOT) 8.6 MG TABLET    Take 2 tablets by mouth 2 (two) times daily.    SODIUM FLUORIDE (PREVIDENT 5000 PLUS) 1.1 % CREA DENTAL CREAM    Place 1 application onto teeth every evening.   TAMSULOSIN HCL (FLOMAX) 0.4 MG CAPS    Take 0.4 mg by mouth every morning.    VENLAFAXINE (EFFEXOR) 75 MG TABLET    Take 75 mg by mouth daily.  Modified Medications   No medications on file  Discontinued Medications   No medications on file    Physical Exam: Filed Vitals:   11/01/14 1233  BP: 134/88  Pulse: 88  Temp: 98.9 F (37.2 C)  TempSrc: Tympanic  Resp: 18   There is no weight on file to calculate BMI.  Physical Exam  Constitutional: He appears well-developed and well-nourished.  HENT:  Head: Normocephalic and atraumatic. Not macrocephalic.  Nose: No mucosal edema or rhinorrhea. Right sinus exhibits no maxillary sinus tenderness and no frontal sinus tenderness. Left sinus exhibits no maxillary sinus tenderness and no frontal sinus tenderness.  Mouth/Throat: Uvula is midline. Posterior oropharyngeal erythema present. No  oropharyngeal exudate or posterior oropharyngeal edema.  Eyes: EOM are normal. Pupils are equal, round, and reactive to light.  Neck: Normal range of motion. Neck supple. No JVD present. No thyromegaly present.  Cardiovascular: Normal rate and regular rhythm.   Murmur heard.  Systolic murmur is present with a grade of 2/6  Pulmonary/Chest: Effort normal and breath sounds normal. He has no wheezes. He has no rales.  Abdominal: Soft. Bowel sounds are normal. There is no tenderness.  Musculoskeletal: Normal  range of motion. He exhibits tenderness. He exhibits no edema.  Scoliokyphosis, walking with walker with SBA. Chronic back pain. 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  Lymphadenopathy:    He has no cervical adenopathy.  Neurological: He is alert. He displays normal reflexes. No cranial nerve deficit. He exhibits normal muscle tone. Coordination normal.  Skin: Skin is warm and dry. No rash noted. No erythema.  AK top of head   Psychiatric: His mood appears not anxious. His affect is not angry, not blunt and not labile. His speech is not delayed and not slurred. He is not agitated, not aggressive, not slowed, not withdrawn, not actively hallucinating and not combative. Thought content is not paranoid and not delusional. Cognition and memory are impaired. He expresses impulsivity. He does not exhibit a depressed mood. He exhibits abnormal recent memory. He is attentive.    Labs reviewed: Basic Metabolic Panel:  Recent Labs  02/11/14 05/16/14 07/18/14  NA 135* 138 137  K 4.3 4.1 4.1  BUN 32* 34* 30*  CREATININE 2.0* 2.3* 2.2*    Liver Function Tests:  Recent Labs  02/11/14 07/18/14  AST 12* 12*  ALT 8* 8*  ALKPHOS 87 82    CBC:  Recent Labs  02/11/14 05/27/14 07/18/14  WBC 6.2 8.1 7.2  HGB 10.7* 13.1* 12.4*  HCT 32* 40* 37*  PLT 208 249 223    Lab Results  Component Value Date   TSH 1.04 06/11/2013   Lab Results  Component Value Date   HGBA1C 6.4*  08/10/2012   No results found for: CHOL, HDL, LDLCALC, LDLDIRECT, TRIG, CHOLHDL  Significant Diagnostic Results since last visit: none  Patient Care Team: Estill Dooms, MD as PCP - General (Internal Medicine)  Assessment/Plan Problem List Items Addressed This Visit    Hypertension - Primary (Chronic)    Controlled, continue Amlodipine 5mg       GERD (gastroesophageal reflux disease) (Chronic)    Stable to be off Omeprazole,  Continue Zantac.       Dementia with behavioral disturbance (Chronic)    Functioning well in SNF, stays in his room most of time, ambulates with walker, continue Namenda.       Chronic kidney disease (Chronic)    Creatinine 2s, chronic       Back pain    Back pain, controlled on Tylenol, had Oxycodone use in the past for flare up pain.       BPH (benign prostatic hyperplasia)    Last urinary retention, Foley inserted 11/04/12 at ED-successfully removed--continued Tamsulosin and Finasteride          Family/ staff Communication: SNF for continue care needs.   Labs/tests ordered: none  ManXie Tarae Wooden NP [facility] [address], [place] [address] On Call:  [phone] & follow prompts after 5pm & weekends Office Phone:  [phone] Office Fax:  [phone]

## 2014-11-05 DIAGNOSIS — Z85828 Personal history of other malignant neoplasm of skin: Secondary | ICD-10-CM | POA: Diagnosis not present

## 2014-11-05 DIAGNOSIS — L570 Actinic keratosis: Secondary | ICD-10-CM | POA: Diagnosis not present

## 2014-12-02 ENCOUNTER — Encounter: Payer: Self-pay | Admitting: Internal Medicine

## 2014-12-02 ENCOUNTER — Non-Acute Institutional Stay (SKILLED_NURSING_FACILITY): Payer: Medicare Other | Admitting: Internal Medicine

## 2014-12-02 DIAGNOSIS — I10 Essential (primary) hypertension: Secondary | ICD-10-CM

## 2014-12-02 DIAGNOSIS — K219 Gastro-esophageal reflux disease without esophagitis: Secondary | ICD-10-CM | POA: Diagnosis not present

## 2014-12-02 DIAGNOSIS — F0391 Unspecified dementia with behavioral disturbance: Secondary | ICD-10-CM | POA: Diagnosis not present

## 2014-12-02 DIAGNOSIS — N183 Chronic kidney disease, stage 3 unspecified: Secondary | ICD-10-CM

## 2014-12-02 DIAGNOSIS — F03918 Unspecified dementia, unspecified severity, with other behavioral disturbance: Secondary | ICD-10-CM

## 2014-12-02 NOTE — Progress Notes (Signed)
Patient ID: Vincent Potts, male   DOB: 1924-12-01, 81 y.o.   MRN: [id number]    [facility] Room Number: N 8  Place of Service: SNF (31)     Allergies  Allergen Reactions  . Olanzapine Other (See Comments)    unknown  . Rivastigmine Nausea Only    Chief Complaint  Patient presents with  . Medical Management of Chronic Issues    HPI:  Essential hypertension - controlled  Gastroesophageal reflux disease without esophagitis  - asymptomatic   Dementia with behavioral disturbance - Unchanged. Remains on Namenda 28 mg.   CKD (chronic kidney disease) stage 3, GFR 30-59 ml/min - Unchanged    Medications: Patient's Medications  New Prescriptions   No medications on file  Previous Medications   ACETAMINOPHEN (TYLENOL) 325 MG TABLET    Take 650 mg by mouth every 4 (four) hours as needed.   ACETAMINOPHEN (TYLENOL) 500 MG TABLET    Take 500 mg by mouth at bedtime. To be given with Restoril   AMLODIPINE (NORVASC) 5 MG TABLET    Take 1 tablet (5 mg total) by mouth every morning.   CARBOXYMETHYLCELLULOSE (REFRESH TEARS) 0.5 % SOLN    Place 1 drop into both eyes 3 (three) times daily.   CHOLECALCIFEROL (VITAMIN D) 1000 UNITS TABLET    Take 1,000 Units by mouth every morning.    CYCLOSPORINE (RESTASIS) 0.05 % OPHTHALMIC EMULSION    Place 1 drop into both eyes 2 (two) times daily.    FEEDING SUPPLEMENT (ENSURE CLINICAL STRENGTH) LIQD    Take 237 mLs by mouth. Take one can daily as needed   FINASTERIDE (PROSCAR) 5 MG TABLET    Take 5 mg by mouth daily.   HYPROMELLOSE (GENTEAL OP)    Apply 1 drop to eye at bedtime.   MAGNESIUM HYDROXIDE (MILK OF MAGNESIA) 400 MG/5ML SUSPENSION    Take 30 mLs by mouth every 3 (three) days. Please take 30 ml by mouth daily prn for constipation.   MELATONIN 3 MG TABS    Take 3 mg by mouth at bedtime. Take one additional at bedtime for sleep   MEMANTINE HCL ER (NAMENDA XR) 28 MG CP24    Take 28 mg by mouth. Take one daily for memory     MIRTAZAPINE (REMERON) 7.5 MG TABLET    Take 7.5 mg by mouth at bedtime.   OXYCODONE (OXY-IR) 5 MG CAPSULE    Take 5 mg by mouth every 4 (four) hours as needed. pain   POTASSIUM CHLORIDE (K-DUR) 10 MEQ TABLET    Take 10 mEq by mouth daily.   RANITIDINE (ZANTAC) 150 MG TABLET    Take 150 mg by mouth at bedtime.   SENNA (SENOKOT) 8.6 MG TABLET    Take 2 tablets by mouth 2 (two) times daily.    SODIUM FLUORIDE (PREVIDENT 5000 PLUS) 1.1 % CREA DENTAL CREAM    Place 1 application onto teeth every evening.   TAMSULOSIN HCL (FLOMAX) 0.4 MG CAPS    Take 0.4 mg by mouth every morning.    VENLAFAXINE (EFFEXOR) 75 MG TABLET    Take 75 mg by mouth daily.  Modified Medications   No medications on file  Discontinued Medications   No medications on file     Review of Systems  Constitutional: Negative for fever, chills and diaphoresis.  HENT: Positive for congestion, hearing loss and sinus pressure. Negative for ear discharge, ear pain, nosebleeds, postnasal drip, rhinorrhea, sore throat and  tinnitus.        Nasal congestion at night  Eyes: Negative for photophobia, pain, discharge and redness.  Respiratory: Negative for cough, shortness of breath, wheezing and stridor.   Cardiovascular: Negative for chest pain, palpitations and leg swelling.  Gastrointestinal: Negative for nausea, vomiting, abdominal pain, diarrhea, constipation and blood in stool.  Endocrine: Negative for polydipsia.  Genitourinary: Positive for frequency. Negative for dysuria, urgency, hematuria and flank pain.  Musculoskeletal: Positive for back pain. Negative for myalgias and neck pain.  Skin: Negative for rash.  Allergic/Immunologic: Negative for environmental allergies.  Neurological: Negative for dizziness, tremors, seizures, weakness and headaches.  Hematological: Does not bruise/bleed easily.  Psychiatric/Behavioral: Negative for suicidal ideas and hallucinations. The patient is not nervous/anxious.     Filed Vitals:    12/02/14 1440  BP: 134/70  Pulse: 71  Temp: 98.1 F (36.7 C)  Resp: 20  Height: 5' (1.524 m)  Weight: 142 lb (64.411 kg)  SpO2: 92%   Body mass index is 27.73 kg/(m^2).  Physical Exam  Constitutional: He appears well-developed and well-nourished.  HENT:  Head: Normocephalic and atraumatic. Not macrocephalic.  Nose: No mucosal edema or rhinorrhea. Right sinus exhibits no maxillary sinus tenderness and no frontal sinus tenderness. Left sinus exhibits no maxillary sinus tenderness and no frontal sinus tenderness.  Mouth/Throat: Uvula is midline. No oropharyngeal exudate, posterior oropharyngeal edema or posterior oropharyngeal erythema.  Eyes: EOM are normal. Pupils are equal, round, and reactive to light.  Neck: Normal range of motion. Neck supple. No JVD present. No thyromegaly present.  Cardiovascular: Normal rate and regular rhythm.   Murmur heard.  Systolic murmur is present with a grade of 2/6  Pulmonary/Chest: Effort normal and breath sounds normal. He has no wheezes. He has no rales.  Abdominal: Soft. Bowel sounds are normal. There is no tenderness.  Musculoskeletal: Normal range of motion. He exhibits tenderness. He exhibits no edema.  Scoliokyphosis, walking with walker with SBA. Chronic back pain. 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  Lymphadenopathy:    He has no cervical adenopathy.  Neurological: He is alert. He displays normal reflexes. No cranial nerve deficit. He exhibits normal muscle tone. Coordination normal.  Skin: Skin is warm and dry. No rash noted. No erythema.  Psychiatric: He has a normal mood and affect. His mood appears not anxious. His affect is not angry, not blunt and not labile. His speech is not delayed and not slurred. He is not agitated, not aggressive, not slowed, not withdrawn, not actively hallucinating and not combative. Thought content is not paranoid and not delusional. Cognition and memory are impaired. He expresses impulsivity. He  does not exhibit a depressed mood. He exhibits abnormal recent memory. He is attentive.     Labs reviewed: Lab Summary Latest Ref Rng 07/18/2014 05/27/2014 05/16/2014 02/11/2014 10/25/2013  Hemoglobin 13.5 - 17.5 g/dL 12.4(A) 13.1(A) (None) 10.7(A) 11.9(A)  Hematocrit 41 - 53 % 37(A) 40(A) (None) 32(A) 36(A)  White count - 7.2 8.1 (None) 6.2 8.3  Platelet count 150 - 399 K/L 223 249 (None) 208 196  Sodium 137 - 147 mmol/L 137 (None) 138 135(A) (None)  Potassium 3.4 - 5.3 mmol/L 4.1 (None) 4.1 4.3 (None)  Calcium - (None) (None) (None) (None) (None)  Phosphorus - (None) (None) (None) (None) (None)  Creatinine 0.6 - 1.3 mg/dL 2.2(A) (None) 2.3(A) 2.0(A) (None)  AST 14 - 40 U/L 12(A) (None) (None) 12(A) (None)  Alk Phos 25 - 125 U/L 82 (None) (None) 87 (None)  Bilirubin - (None) (None) (None) (None) (None)  Glucose - 85 (None) 73 71 (None)  Cholesterol - (None) (None) (None) (None) (None)  HDL cholesterol - (None) (None) (None) (None) (None)  Triglycerides - (None) (None) (None) (None) (None)  LDL Direct - (None) (None) (None) (None) (None)  LDL Calc - (None) (None) (None) (None) (None)  Total protein - (None) (None) (None) (None) (None)  Albumin - (None) (None) (None) (None) (None)   Lab Results  Component Value Date   TSH 1.04 06/11/2013   Lab Results  Component Value Date   BUN 30* 07/18/2014   Lab Results  Component Value Date   HGBA1C 6.4* 08/10/2012    Assessment/Plan 1. Essential hypertension Controlled  2. Gastroesophageal reflux disease without esophagitis Asymptomatic  3. Dementia with behavioral disturbance Continue current medications  4. CKD (chronic kidney disease) stage 3, GFR 30-59 ml/min Continue to observe

## 2014-12-27 ENCOUNTER — Non-Acute Institutional Stay (SKILLED_NURSING_FACILITY): Payer: Medicare Other | Admitting: Nurse Practitioner

## 2014-12-27 ENCOUNTER — Encounter: Payer: Self-pay | Admitting: Nurse Practitioner

## 2014-12-27 DIAGNOSIS — J329 Chronic sinusitis, unspecified: Secondary | ICD-10-CM | POA: Diagnosis not present

## 2014-12-27 DIAGNOSIS — K5900 Constipation, unspecified: Secondary | ICD-10-CM | POA: Diagnosis not present

## 2014-12-27 DIAGNOSIS — F03918 Unspecified dementia, unspecified severity, with other behavioral disturbance: Secondary | ICD-10-CM

## 2014-12-27 DIAGNOSIS — I10 Essential (primary) hypertension: Secondary | ICD-10-CM | POA: Diagnosis not present

## 2014-12-27 DIAGNOSIS — N400 Enlarged prostate without lower urinary tract symptoms: Secondary | ICD-10-CM

## 2014-12-27 DIAGNOSIS — N183 Chronic kidney disease, stage 3 unspecified: Secondary | ICD-10-CM

## 2014-12-27 DIAGNOSIS — F329 Major depressive disorder, single episode, unspecified: Secondary | ICD-10-CM | POA: Diagnosis not present

## 2014-12-27 DIAGNOSIS — M5440 Lumbago with sciatica, unspecified side: Secondary | ICD-10-CM | POA: Diagnosis not present

## 2014-12-27 DIAGNOSIS — K219 Gastro-esophageal reflux disease without esophagitis: Secondary | ICD-10-CM | POA: Diagnosis not present

## 2014-12-27 DIAGNOSIS — F0391 Unspecified dementia with behavioral disturbance: Secondary | ICD-10-CM

## 2014-12-27 DIAGNOSIS — F32A Depression, unspecified: Secondary | ICD-10-CM

## 2014-12-27 NOTE — Assessment & Plan Note (Signed)
Stable, continue Senokot S II bid and MOM q 3days-stable.

## 2014-12-27 NOTE — Assessment & Plan Note (Signed)
Stable, off Omeprazole, continue Zantac.

## 2014-12-27 NOTE — Assessment & Plan Note (Signed)
Stable, continue Effexor 75mg  and Mirtazapine 7.5mg  qhs. Sleeps well at night

## 2014-12-27 NOTE — Assessment & Plan Note (Signed)
Stable, dc Flonase, observe.

## 2014-12-27 NOTE — Assessment & Plan Note (Signed)
Last urinary retention, Foley inserted 11/04/12 at ED-successfully removed--continued Tamsulosin and Finasteride 

## 2014-12-27 NOTE — Assessment & Plan Note (Signed)
Controlled, continue Amlodipine 5mg  

## 2014-12-27 NOTE — Assessment & Plan Note (Signed)
Functioning well in SNF, stays in his room most of time, ambulates with walker, continue Namenda 

## 2014-12-27 NOTE — Progress Notes (Signed)
Patient ID: Vincent Potts, male   DOB: 1924-12-27, 81 y.o.   MRN: [id number]  Location:  SNF [facility] Provider:  Marlana Latus NP  Code Status:  DNR Goals of care: Advanced Directive information    Chief Complaint  Patient presents with  . Medical Management of Chronic Issues     HPI: Patient is a 81 y.o. male seen in the SNF at [facility] today for evaluation of  chronic medical conditions. Hx of HTN controlled while on Amlodipine 5mg , his GERD is well controlled on Zantac, off Omeprazole, dementia has been stabilized on Namenda, resides in SNF for care needs. His chronic kidney disease with baseline creat 2s. He is forgetful, but able to voice his needs while on Namenda Hx of BPH with recurrent urinary retention is the past, last episode was about 2 years ago, taking Tamsulosin and Finasteride.   Review of Systems:  Review of Systems  Constitutional: Negative for fever and chills.  HENT: Positive for hearing loss. Negative for congestion, ear pain and nosebleeds.   Eyes: Negative for pain, discharge and redness.  Respiratory: Negative for cough, shortness of breath, wheezing and stridor.   Cardiovascular: Negative for chest pain, palpitations and leg swelling.  Gastrointestinal: Negative for nausea, vomiting, abdominal pain, diarrhea and constipation.  Genitourinary: Positive for frequency. Negative for dysuria, urgency and flank pain.  Musculoskeletal: Positive for back pain. Negative for myalgias and neck pain.  Skin: Negative for rash.  Neurological: Negative for dizziness, tremors, seizures, weakness and headaches.  Psychiatric/Behavioral: Positive for memory loss. The patient is not nervous/anxious and does not have insomnia.     Past Medical History  Diagnosis Date  . Thyroid disease   . Anemia   . Vitamin D deficiency disease   . Hyponatremia   . Anxiety   . Parkinsonism ([place])   . Hearing loss   . Hypertension   . PVC (premature ventricular contraction)   . CVA  (cerebral infarction)   . GERD (gastroesophageal reflux disease)   . Diverticulosis   . Sciatica   . Back pain   . Gait disorder   . Cataract   . Edema   . Keratosis   . Sciatica   . Cystic disease of liver   . Paresthesia   . Inguinal hernia   . Hearing loss   . Stroke [facility]) 2010    no deficits  . Chronic kidney disease 2013    rhabdo  . H/O hiatal hernia 2008    repaired  . Depression 06/06/2012  . Dementia with behavioral disturbance 06/06/2012  . CKD (chronic kidney disease) stage 3, GFR 30-59 ml/min 08/29/2012    Creatinine 1.88 07/05/13(baseline 1.5-2.0) 02/11/14 creatinine 2.00 05/16/14 creatinine 2.31 07/18/14 creatinine 2.22       Patient Active Problem List   Diagnosis Date Noted  . AK (actinic keratosis) 03/19/2014  . Sinusitis, chronic 01/08/2014  . Insomnia 06/08/2013  . CKD (chronic kidney disease) stage 3, GFR 30-59 ml/min 08/29/2012  . Depression 06/06/2012  . Dementia with behavioral disturbance 06/06/2012  . Constipation 06/06/2012  . BPH (benign prostatic hyperplasia) 06/06/2012  . Nephrolithiasis 10/25/2011  . Fall 04/19/2011  . Parkinsonism ([place])   . CVA (cerebral infarction)   . Hypertension   . Back pain   . Hearing loss   . Gait disorder   . Diverticulosis   . GERD (gastroesophageal reflux disease)     Allergies  Allergen Reactions  . Olanzapine Other (See Comments)    unknown  .  Rivastigmine Nausea Only    Medications: Patient's Medications  New Prescriptions   No medications on file  Previous Medications   ACETAMINOPHEN (TYLENOL) 325 MG TABLET    Take 650 mg by mouth every 4 (four) hours as needed.   ACETAMINOPHEN (TYLENOL) 500 MG TABLET    Take 500 mg by mouth at bedtime. To be given with Restoril   AMLODIPINE (NORVASC) 5 MG TABLET    Take 1 tablet (5 mg total) by mouth every morning.   CARBOXYMETHYLCELLULOSE (REFRESH TEARS) 0.5 % SOLN    Place 1 drop into both eyes 3 (three) times daily.   CHOLECALCIFEROL (VITAMIN D) 1000 UNITS TABLET     Take 1,000 Units by mouth every morning.    CYCLOSPORINE (RESTASIS) 0.05 % OPHTHALMIC EMULSION    Place 1 drop into both eyes 2 (two) times daily.    FEEDING SUPPLEMENT (ENSURE CLINICAL STRENGTH) LIQD    Take 237 mLs by mouth. Take one can daily as needed   FINASTERIDE (PROSCAR) 5 MG TABLET    Take 5 mg by mouth daily.   HYPROMELLOSE (GENTEAL OP)    Apply 1 drop to eye at bedtime.   MAGNESIUM HYDROXIDE (MILK OF MAGNESIA) 400 MG/5ML SUSPENSION    Take 30 mLs by mouth every 3 (three) days. Please take 30 ml by mouth daily prn for constipation.   MELATONIN 3 MG TABS    Take 3 mg by mouth at bedtime. Take one additional at bedtime for sleep   MEMANTINE HCL ER (NAMENDA XR) 28 MG CP24    Take 28 mg by mouth. Take one daily for memory   MIRTAZAPINE (REMERON) 7.5 MG TABLET    Take 7.5 mg by mouth at bedtime.   OXYCODONE (OXY-IR) 5 MG CAPSULE    Take 5 mg by mouth every 4 (four) hours as needed. pain   POTASSIUM CHLORIDE (K-DUR) 10 MEQ TABLET    Take 10 mEq by mouth daily.   RANITIDINE (ZANTAC) 150 MG TABLET    Take 150 mg by mouth at bedtime.   SENNA (SENOKOT) 8.6 MG TABLET    Take 2 tablets by mouth 2 (two) times daily.    SODIUM FLUORIDE (PREVIDENT 5000 PLUS) 1.1 % CREA DENTAL CREAM    Place 1 application onto teeth every evening.   TAMSULOSIN HCL (FLOMAX) 0.4 MG CAPS    Take 0.4 mg by mouth every morning.    VENLAFAXINE (EFFEXOR) 75 MG TABLET    Take 75 mg by mouth daily.  Modified Medications   No medications on file  Discontinued Medications   No medications on file    Physical Exam: Filed Vitals:   12/27/14 1204  BP: 136/80  Pulse: 84  Temp: 97.6 F (36.4 C)  TempSrc: Tympanic  Resp: 20   There is no weight on file to calculate BMI.  Physical Exam  Constitutional: He appears well-developed and well-nourished.  HENT:  Head: Normocephalic and atraumatic. Not macrocephalic.  Nose: No mucosal edema or rhinorrhea. Right sinus exhibits no maxillary sinus tenderness and no frontal  sinus tenderness. Left sinus exhibits no maxillary sinus tenderness and no frontal sinus tenderness.  Mouth/Throat: Uvula is midline. Posterior oropharyngeal erythema present. No oropharyngeal exudate or posterior oropharyngeal edema.  Eyes: EOM are normal. Pupils are equal, round, and reactive to light.  Neck: Normal range of motion. Neck supple. No JVD present. No thyromegaly present.  Cardiovascular: Normal rate and regular rhythm.   Murmur heard.  Systolic murmur is present with a grade of  2/6  Pulmonary/Chest: Effort normal and breath sounds normal. He has no wheezes. He has no rales.  Abdominal: Soft. Bowel sounds are normal. There is no tenderness.  Musculoskeletal: Normal range of motion. He exhibits tenderness. He exhibits no edema.  Scoliokyphosis, walking with walker with SBA. Chronic back pain. 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  Lymphadenopathy:    He has no cervical adenopathy.  Neurological: He is alert. He displays normal reflexes. No cranial nerve deficit. He exhibits normal muscle tone. Coordination normal.  Skin: Skin is warm and dry. No rash noted. No erythema.  AK top of head   Psychiatric: His mood appears not anxious. His affect is not angry, not blunt and not labile. His speech is not delayed and not slurred. He is not agitated, not aggressive, not slowed, not withdrawn, not actively hallucinating and not combative. Thought content is not paranoid and not delusional. Cognition and memory are impaired. He expresses impulsivity. He does not exhibit a depressed mood. He exhibits abnormal recent memory. He is attentive.    Labs reviewed: Basic Metabolic Panel:  Recent Labs  02/11/14 05/16/14 07/18/14  NA 135* 138 137  K 4.3 4.1 4.1  BUN 32* 34* 30*  CREATININE 2.0* 2.3* 2.2*    Liver Function Tests:  Recent Labs  02/11/14 07/18/14  AST 12* 12*  ALT 8* 8*  ALKPHOS 87 82    CBC:  Recent Labs  02/11/14 05/27/14 07/18/14  WBC 6.2 8.1 7.2    HGB 10.7* 13.1* 12.4*  HCT 32* 40* 37*  PLT 208 249 223    Lab Results  Component Value Date   TSH 1.04 06/11/2013   Lab Results  Component Value Date   HGBA1C 6.4* 08/10/2012   No results found for: CHOL, HDL, LDLCALC, LDLDIRECT, TRIG, CHOLHDL  Significant Diagnostic Results since last visit: none  Patient Care Team: Estill Dooms, MD as PCP - General (Internal Medicine)  Assessment/Plan Problem List Items Addressed This Visit    Back pain - Primary    Back pain, controlled on Tylenol, off Oxycodone since 07/16/14      BPH (benign prostatic hyperplasia)    Last urinary retention, Foley inserted 11/04/12 at ED-successfully removed--continued Tamsulosin and Finasteride      CKD (chronic kidney disease) stage 3, GFR 30-59 ml/min (Chronic)    Baseline creatinine 2s, chronic       Constipation    Stable, continue Senokot S II bid and MOM q 3days-stable.       Dementia with behavioral disturbance (Chronic)    Functioning well in SNF, stays in his room most of time, ambulates with walker, continue Namenda.       Depression    Stable, continue Effexor 75mg  and Mirtazapine 7.5mg  qhs. Sleeps well at night       GERD (gastroesophageal reflux disease) (Chronic)    Stable, off Omeprazole, continue Zantac.       Hypertension (Chronic)    Controlled, continue Amlodipine 5mg       Sinusitis, chronic    Stable, dc Flonase, observe.           Family/ staff Communication: SNF for continue care needs.   Labs/tests ordered: none  ManXie Rossie Bretado NP [facility] [address] [place],  [address] On Call:  [phone] & follow prompts after 5pm & weekends Office Phone:  [phone] Office Fax:  [phone]

## 2014-12-27 NOTE — Assessment & Plan Note (Signed)
Baseline creatinine 2s, chronic

## 2014-12-27 NOTE — Assessment & Plan Note (Signed)
Back pain, controlled on Tylenol, off Oxycodone since 07/16/14

## 2015-01-21 ENCOUNTER — Encounter: Payer: Self-pay | Admitting: Nurse Practitioner

## 2015-01-21 ENCOUNTER — Non-Acute Institutional Stay (SKILLED_NURSING_FACILITY): Payer: Medicare Other | Admitting: Nurse Practitioner

## 2015-01-21 DIAGNOSIS — F0391 Unspecified dementia with behavioral disturbance: Secondary | ICD-10-CM | POA: Diagnosis not present

## 2015-01-21 DIAGNOSIS — F03918 Unspecified dementia, unspecified severity, with other behavioral disturbance: Secondary | ICD-10-CM

## 2015-01-21 DIAGNOSIS — M5440 Lumbago with sciatica, unspecified side: Secondary | ICD-10-CM

## 2015-01-21 DIAGNOSIS — I10 Essential (primary) hypertension: Secondary | ICD-10-CM | POA: Diagnosis not present

## 2015-01-21 DIAGNOSIS — N183 Chronic kidney disease, stage 3 unspecified: Secondary | ICD-10-CM

## 2015-01-21 DIAGNOSIS — F32A Depression, unspecified: Secondary | ICD-10-CM

## 2015-01-21 DIAGNOSIS — K5900 Constipation, unspecified: Secondary | ICD-10-CM | POA: Diagnosis not present

## 2015-01-21 DIAGNOSIS — F329 Major depressive disorder, single episode, unspecified: Secondary | ICD-10-CM | POA: Diagnosis not present

## 2015-01-21 DIAGNOSIS — K219 Gastro-esophageal reflux disease without esophagitis: Secondary | ICD-10-CM | POA: Diagnosis not present

## 2015-01-21 DIAGNOSIS — N400 Enlarged prostate without lower urinary tract symptoms: Secondary | ICD-10-CM | POA: Diagnosis not present

## 2015-01-21 NOTE — Assessment & Plan Note (Signed)
Stable, continue Effexor 75mg and Mirtazapine 7.5mg qhs. Sleeps well at night  

## 2015-01-21 NOTE — Assessment & Plan Note (Signed)
Controlled, continue Amlodipine 5mg  

## 2015-01-21 NOTE — Progress Notes (Signed)
Patient ID: Vincent Potts, male   DOB: 02-13-1925, 81 y.o.   MRN: [id number]  Location:  SNF [facility] Provider:  Marlana Latus NP  Code Status:  DNR Goals of care: Advanced Directive information    Chief Complaint  Patient presents with  . Medical Management of Chronic Issues     HPI: Patient is a 81 y.o. male seen in the SNF at [facility] today for evaluation of Hx of HTN controlled while on Amlodipine 5mg , GERD is managed on Zantac, off Omeprazole, dementia has been stabilized on Namenda, resides in SNF for care needs. Hx if chronic kidney disease with baseline creat 2s. He is forgetful, but able to voice his needs while on Namenda Hx of BPH with recurrent urinary retention is the past, last episode was about 2 years ago, taking Tamsulosin and Finasteride.   Review of Systems  Constitutional: Negative for fever and chills.  HENT: Positive for hearing loss. Negative for congestion, ear pain and nosebleeds.   Eyes: Negative for pain, discharge and redness.  Respiratory: Negative for cough, shortness of breath, wheezing and stridor.   Cardiovascular: Negative for chest pain, palpitations and leg swelling.  Gastrointestinal: Negative for nausea, vomiting, abdominal pain, diarrhea and constipation.  Genitourinary: Positive for frequency. Negative for dysuria, urgency and flank pain.  Musculoskeletal: Positive for back pain. Negative for myalgias and neck pain.  Skin: Negative for rash.  Neurological: Negative for dizziness, tremors, seizures, weakness and headaches.  Psychiatric/Behavioral: Positive for memory loss. The patient is not nervous/anxious and does not have insomnia.     Past Medical History  Diagnosis Date  . Thyroid disease   . Anemia   . Vitamin D deficiency disease   . Hyponatremia   . Anxiety   . Parkinsonism ([place])   . Hearing loss   . Hypertension   . PVC (premature ventricular contraction)   . CVA (cerebral infarction)   . GERD (gastroesophageal reflux disease)    . Diverticulosis   . Sciatica   . Back pain   . Gait disorder   . Cataract   . Edema   . Keratosis   . Sciatica   . Cystic disease of liver   . Paresthesia   . Inguinal hernia   . Hearing loss   . Stroke [facility]) 2010    no deficits  . Chronic kidney disease 2013    rhabdo  . H/O hiatal hernia 2008    repaired  . Depression 06/06/2012  . Dementia with behavioral disturbance 06/06/2012  . CKD (chronic kidney disease) stage 3, GFR 30-59 ml/min 08/29/2012    Creatinine 1.88 07/05/13(baseline 1.5-2.0) 02/11/14 creatinine 2.00 05/16/14 creatinine 2.31 07/18/14 creatinine 2.22       Patient Active Problem List   Diagnosis Date Noted  . AK (actinic keratosis) 03/19/2014  . Sinusitis, chronic 01/08/2014  . Insomnia 06/08/2013  . CKD (chronic kidney disease) stage 3, GFR 30-59 ml/min 08/29/2012  . Depression 06/06/2012  . Dementia with behavioral disturbance 06/06/2012  . Constipation 06/06/2012  . BPH (benign prostatic hyperplasia) 06/06/2012  . Nephrolithiasis 10/25/2011  . Fall 04/19/2011  . Parkinsonism ([place])   . CVA (cerebral infarction)   . Hypertension   . Back pain   . Hearing loss   . Gait disorder   . Diverticulosis   . GERD (gastroesophageal reflux disease)     Allergies  Allergen Reactions  . Olanzapine Other (See Comments)    unknown  . Rivastigmine Nausea Only    Medications: Patient's  Medications  New Prescriptions   No medications on file  Previous Medications   ACETAMINOPHEN (TYLENOL) 325 MG TABLET    Take 650 mg by mouth every 4 (four) hours as needed.   ACETAMINOPHEN (TYLENOL) 500 MG TABLET    Take 500 mg by mouth at bedtime. To be given with Restoril   AMLODIPINE (NORVASC) 5 MG TABLET    Take 1 tablet (5 mg total) by mouth every morning.   CARBOXYMETHYLCELLULOSE (REFRESH TEARS) 0.5 % SOLN    Place 1 drop into both eyes 3 (three) times daily.   CHOLECALCIFEROL (VITAMIN D) 1000 UNITS TABLET    Take 1,000 Units by mouth every morning.    CYCLOSPORINE  (RESTASIS) 0.05 % OPHTHALMIC EMULSION    Place 1 drop into both eyes 2 (two) times daily.    FEEDING SUPPLEMENT (ENSURE CLINICAL STRENGTH) LIQD    Take 237 mLs by mouth. Take one can daily as needed   FINASTERIDE (PROSCAR) 5 MG TABLET    Take 5 mg by mouth daily.   HYPROMELLOSE (GENTEAL OP)    Apply 1 drop to eye at bedtime.   MAGNESIUM HYDROXIDE (MILK OF MAGNESIA) 400 MG/5ML SUSPENSION    Take 30 mLs by mouth every 3 (three) days. Please take 30 ml by mouth daily prn for constipation.   MELATONIN 3 MG TABS    Take 3 mg by mouth at bedtime. Take one additional at bedtime for sleep   MEMANTINE HCL ER (NAMENDA XR) 28 MG CP24    Take 28 mg by mouth. Take one daily for memory   MIRTAZAPINE (REMERON) 7.5 MG TABLET    Take 7.5 mg by mouth at bedtime.   OXYCODONE (OXY-IR) 5 MG CAPSULE    Take 5 mg by mouth every 4 (four) hours as needed. pain   POTASSIUM CHLORIDE (K-DUR) 10 MEQ TABLET    Take 10 mEq by mouth daily.   RANITIDINE (ZANTAC) 150 MG TABLET    Take 150 mg by mouth at bedtime.   SENNA (SENOKOT) 8.6 MG TABLET    Take 2 tablets by mouth 2 (two) times daily.    SODIUM FLUORIDE (PREVIDENT 5000 PLUS) 1.1 % CREA DENTAL CREAM    Place 1 application onto teeth every evening.   TAMSULOSIN HCL (FLOMAX) 0.4 MG CAPS    Take 0.4 mg by mouth every morning.    VENLAFAXINE (EFFEXOR) 75 MG TABLET    Take 75 mg by mouth daily.  Modified Medications   No medications on file  Discontinued Medications   No medications on file    Physical Exam: Filed Vitals:   01/21/15 1449  BP: 151/89  Pulse: 93  Temp: 97.4 F (36.3 C)  TempSrc: Tympanic  Resp: 20   There is no weight on file to calculate BMI.  Physical Exam  Constitutional: He appears well-developed and well-nourished.  HENT:  Head: Normocephalic and atraumatic. Not macrocephalic.  Nose: No mucosal edema or rhinorrhea. Right sinus exhibits no maxillary sinus tenderness and no frontal sinus tenderness. Left sinus exhibits no maxillary sinus  tenderness and no frontal sinus tenderness.  Mouth/Throat: Uvula is midline. Posterior oropharyngeal erythema present. No oropharyngeal exudate or posterior oropharyngeal edema.  Eyes: EOM are normal. Pupils are equal, round, and reactive to light.  Neck: Normal range of motion. Neck supple. No JVD present. No thyromegaly present.  Cardiovascular: Normal rate and regular rhythm.   Murmur heard.  Systolic murmur is present with a grade of 2/6  Pulmonary/Chest: Effort normal and breath sounds  normal. He has no wheezes. He has no rales.  Abdominal: Soft. Bowel sounds are normal. There is no tenderness.  Musculoskeletal: Normal range of motion. He exhibits tenderness. He exhibits no edema.  Scoliokyphosis, walking with walker with SBA. Chronic back pain. 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  Lymphadenopathy:    He has no cervical adenopathy.  Neurological: He is alert. He displays normal reflexes. No cranial nerve deficit. He exhibits normal muscle tone. Coordination normal.  Skin: Skin is warm and dry. No rash noted. No erythema.  AK top of head   Psychiatric: His mood appears not anxious. His affect is not angry, not blunt and not labile. His speech is not delayed and not slurred. He is not agitated, not aggressive, not slowed, not withdrawn, not actively hallucinating and not combative. Thought content is not paranoid and not delusional. Cognition and memory are impaired. He expresses impulsivity. He does not exhibit a depressed mood. He exhibits abnormal recent memory. He is attentive.    Labs reviewed: Basic Metabolic Panel:  Recent Labs  02/11/14 05/16/14 07/18/14  NA 135* 138 137  K 4.3 4.1 4.1  BUN 32* 34* 30*  CREATININE 2.0* 2.3* 2.2*    Liver Function Tests:  Recent Labs  02/11/14 07/18/14  AST 12* 12*  ALT 8* 8*  ALKPHOS 87 82    CBC:  Recent Labs  02/11/14 05/27/14 07/18/14  WBC 6.2 8.1 7.2  HGB 10.7* 13.1* 12.4*  HCT 32* 40* 37*  PLT 208 249 223      Lab Results  Component Value Date   TSH 1.04 06/11/2013   Lab Results  Component Value Date   HGBA1C 6.4* 08/10/2012   No results found for: CHOL, HDL, LDLCALC, LDLDIRECT, TRIG, CHOLHDL  Significant Diagnostic Results since last visit: none  Patient Care Team: Estill Dooms, MD as PCP - General (Internal Medicine)  Assessment/Plan Problem List Items Addressed This Visit    Hypertension - Primary (Chronic)    Controlled, continue Amlodipine 5mg       GERD (gastroesophageal reflux disease) (Chronic)    Stable, off Omeprazole, continue Zantac.       Dementia with behavioral disturbance (Chronic)    Functioning well in SNF, stays in his room most of time, ambulates with walker, continue Namenda.      CKD (chronic kidney disease) stage 3, GFR 30-59 ml/min (Chronic)    Baseline creatinine 2s, chronic       Back pain    Back pain, controlled on Tylenol, off Oxycodone since 07/16/14      Depression    Stable, continue Effexor 75mg  and Mirtazapine 7.5mg  qhs. Sleeps well at night      Constipation    Stable, but occasional constipated, continue Senokot S II bid and MOM q 3days-stable. Adding Doculax suppository prn.       BPH (benign prostatic hyperplasia)    Last urinary retention, Foley inserted 11/04/12 at ED-successfully removed--continued Tamsulosin and Finasteride          Family/ staff Communication: SNF for continue care needs.   Labs/tests ordered: none  ManXie Tiffancy Moger NP [facility] [address], [place] [address] On Call:  [phone] & follow prompts after 5pm & weekends Office Phone:  [phone] Office Fax:  [phone]

## 2015-01-21 NOTE — Assessment & Plan Note (Signed)
Last urinary retention, Foley inserted 11/04/12 at ED-successfully removed--continued Tamsulosin and Finasteride 

## 2015-01-21 NOTE — Assessment & Plan Note (Addendum)
Stable, but occasional constipated, continue Senokot S II bid and MOM q 3days-stable. Adding Doculax suppository prn.

## 2015-01-21 NOTE — Assessment & Plan Note (Signed)
Baseline creatinine 2s, chronic  

## 2015-01-21 NOTE — Assessment & Plan Note (Signed)
Stable, off Omeprazole, continue Zantac.  

## 2015-01-21 NOTE — Assessment & Plan Note (Signed)
Functioning well in SNF, stays in his room most of time, ambulates with walker, continue Namenda 

## 2015-01-21 NOTE — Assessment & Plan Note (Signed)
Back pain, controlled on Tylenol, off Oxycodone since 07/16/14 

## 2015-02-21 ENCOUNTER — Non-Acute Institutional Stay (SKILLED_NURSING_FACILITY): Payer: Medicare Other | Admitting: Nurse Practitioner

## 2015-02-21 ENCOUNTER — Encounter: Payer: Self-pay | Admitting: Nurse Practitioner

## 2015-02-21 DIAGNOSIS — K5900 Constipation, unspecified: Secondary | ICD-10-CM | POA: Diagnosis not present

## 2015-02-21 DIAGNOSIS — M5440 Lumbago with sciatica, unspecified side: Secondary | ICD-10-CM

## 2015-02-21 DIAGNOSIS — N183 Chronic kidney disease, stage 3 unspecified: Secondary | ICD-10-CM

## 2015-02-21 DIAGNOSIS — N400 Enlarged prostate without lower urinary tract symptoms: Secondary | ICD-10-CM

## 2015-02-21 DIAGNOSIS — F329 Major depressive disorder, single episode, unspecified: Secondary | ICD-10-CM

## 2015-02-21 DIAGNOSIS — F0391 Unspecified dementia with behavioral disturbance: Secondary | ICD-10-CM

## 2015-02-21 DIAGNOSIS — K219 Gastro-esophageal reflux disease without esophagitis: Secondary | ICD-10-CM | POA: Diagnosis not present

## 2015-02-21 DIAGNOSIS — F32A Depression, unspecified: Secondary | ICD-10-CM

## 2015-02-21 DIAGNOSIS — I10 Essential (primary) hypertension: Secondary | ICD-10-CM

## 2015-02-21 DIAGNOSIS — F03918 Unspecified dementia, unspecified severity, with other behavioral disturbance: Secondary | ICD-10-CM

## 2015-02-21 NOTE — Assessment & Plan Note (Signed)
Stable, but occasional constipated, continue Senokot S II bid and MOM q 3days-stable. Adding Doculax suppository prn.

## 2015-02-21 NOTE — Assessment & Plan Note (Signed)
Controlled, continue Amlodipine 5mg  

## 2015-02-21 NOTE — Assessment & Plan Note (Signed)
Baseline creatinine 2s, chronic  

## 2015-02-21 NOTE — Progress Notes (Signed)
Patient ID: Vincent Potts, male   DOB: [birth year], 81 y.o.   MRN: [id number]  Location:  SNF [facility] Provider:  Marlana Latus NP  Code Status:  DNR Goals of care: Advanced Directive information    Chief Complaint  Patient presents with  . Medical Management of Chronic Issues     HPI: Patient is a 81 y.o. male seen in the SNF at [facility] today for evaluation of Hx of HTN controlled while on Amlodipine 5mg , GERD is managed on Zantac, off Omeprazole, dementia has been stabilized on Namenda, resides in SNF for care needs. Hx if chronic kidney disease with baseline creat 2s. He is forgetful, but able to voice his needs while on Namenda Hx of BPH with recurrent urinary retention is the past, last episode was about 2 years ago, taking Tamsulosin and Finasteride.   Review of Systems  Constitutional: Negative for fever and chills.  HENT: Positive for hearing loss. Negative for congestion, ear pain and nosebleeds.   Eyes: Negative for pain, discharge and redness.  Respiratory: Negative for cough, shortness of breath, wheezing and stridor.   Cardiovascular: Negative for chest pain, palpitations and leg swelling.  Gastrointestinal: Negative for nausea, vomiting, abdominal pain, diarrhea and constipation.  Genitourinary: Positive for frequency. Negative for dysuria, urgency and flank pain.  Musculoskeletal: Positive for back pain. Negative for myalgias and neck pain.  Skin: Negative for rash.  Neurological: Negative for dizziness, tremors, seizures, weakness and headaches.  Psychiatric/Behavioral: Positive for memory loss. The patient is not nervous/anxious and does not have insomnia.     Past Medical History  Diagnosis Date  . Thyroid disease   . Anemia   . Vitamin D deficiency disease   . Hyponatremia   . Anxiety   . Parkinsonism ([place])   . Hearing loss   . Hypertension   . PVC (premature ventricular contraction)   . CVA (cerebral infarction)   . GERD (gastroesophageal reflux disease)    . Diverticulosis   . Sciatica   . Back pain   . Gait disorder   . Cataract   . Edema   . Keratosis   . Sciatica   . Cystic disease of liver   . Paresthesia   . Inguinal hernia   . Hearing loss   . Stroke [facility]) 2010    no deficits  . Chronic kidney disease 2013    rhabdo  . H/O hiatal hernia 2008    repaired  . Depression 06/06/2012  . Dementia with behavioral disturbance 06/06/2012  . CKD (chronic kidney disease) stage 3, GFR 30-59 ml/min 08/29/2012    Creatinine 1.88 07/05/13(baseline 1.5-2.0) 02/11/14 creatinine 2.00 05/16/14 creatinine 2.31 07/18/14 creatinine 2.22       Patient Active Problem List   Diagnosis Date Noted  . AK (actinic keratosis) 03/19/2014  . Sinusitis, chronic 01/08/2014  . Insomnia 06/08/2013  . CKD (chronic kidney disease) stage 3, GFR 30-59 ml/min 08/29/2012  . Depression 06/06/2012  . Dementia with behavioral disturbance 06/06/2012  . Constipation 06/06/2012  . BPH (benign prostatic hyperplasia) 06/06/2012  . Nephrolithiasis 10/25/2011  . Fall 04/19/2011  . Parkinsonism ([place])   . CVA (cerebral infarction)   . Hypertension   . Back pain   . Hearing loss   . Gait disorder   . Diverticulosis   . GERD (gastroesophageal reflux disease)     Allergies  Allergen Reactions  . Olanzapine Other (See Comments)    unknown  . Rivastigmine Nausea Only    Medications: Patient's  Medications  New Prescriptions   No medications on file  Previous Medications   ACETAMINOPHEN (TYLENOL) 325 MG TABLET    Take 650 mg by mouth every 4 (four) hours as needed.   ACETAMINOPHEN (TYLENOL) 500 MG TABLET    Take 500 mg by mouth at bedtime. To be given with Restoril   AMLODIPINE (NORVASC) 5 MG TABLET    Take 1 tablet (5 mg total) by mouth every morning.   CARBOXYMETHYLCELLULOSE (REFRESH TEARS) 0.5 % SOLN    Place 1 drop into both eyes 3 (three) times daily.   CHOLECALCIFEROL (VITAMIN D) 1000 UNITS TABLET    Take 1,000 Units by mouth every morning.    CYCLOSPORINE  (RESTASIS) 0.05 % OPHTHALMIC EMULSION    Place 1 drop into both eyes 2 (two) times daily.    FEEDING SUPPLEMENT (ENSURE CLINICAL STRENGTH) LIQD    Take 237 mLs by mouth. Take one can daily as needed   FINASTERIDE (PROSCAR) 5 MG TABLET    Take 5 mg by mouth daily.   HYPROMELLOSE (GENTEAL OP)    Apply 1 drop to eye at bedtime.   MAGNESIUM HYDROXIDE (MILK OF MAGNESIA) 400 MG/5ML SUSPENSION    Take 30 mLs by mouth every 3 (three) days. Please take 30 ml by mouth daily prn for constipation.   MELATONIN 3 MG TABS    Take 3 mg by mouth at bedtime. Take one additional at bedtime for sleep   MEMANTINE HCL ER (NAMENDA XR) 28 MG CP24    Take 28 mg by mouth. Take one daily for memory   MIRTAZAPINE (REMERON) 7.5 MG TABLET    Take 7.5 mg by mouth at bedtime.   OXYCODONE (OXY-IR) 5 MG CAPSULE    Take 5 mg by mouth every 4 (four) hours as needed. pain   POTASSIUM CHLORIDE (K-DUR) 10 MEQ TABLET    Take 10 mEq by mouth daily.   RANITIDINE (ZANTAC) 150 MG TABLET    Take 150 mg by mouth at bedtime.   SENNA (SENOKOT) 8.6 MG TABLET    Take 2 tablets by mouth 2 (two) times daily.    SODIUM FLUORIDE (PREVIDENT 5000 PLUS) 1.1 % CREA DENTAL CREAM    Place 1 application onto teeth every evening.   TAMSULOSIN HCL (FLOMAX) 0.4 MG CAPS    Take 0.4 mg by mouth every morning.    VENLAFAXINE (EFFEXOR) 75 MG TABLET    Take 75 mg by mouth daily.  Modified Medications   No medications on file  Discontinued Medications   No medications on file    Physical Exam: Filed Vitals:   02/21/15 1317  BP: 158/91  Pulse: 76  Temp: 98.5 F (36.9 C)  TempSrc: Tympanic  Resp: 19   There is no weight on file to calculate BMI.  Physical Exam  Constitutional: He appears well-developed and well-nourished.  HENT:  Head: Normocephalic and atraumatic. Not macrocephalic.  Nose: No mucosal edema or rhinorrhea. Right sinus exhibits no maxillary sinus tenderness and no frontal sinus tenderness. Left sinus exhibits no maxillary sinus  tenderness and no frontal sinus tenderness.  Mouth/Throat: Uvula is midline. Posterior oropharyngeal erythema present. No oropharyngeal exudate or posterior oropharyngeal edema.  Eyes: EOM are normal. Pupils are equal, round, and reactive to light.  Neck: Normal range of motion. Neck supple. No JVD present. No thyromegaly present.  Cardiovascular: Normal rate and regular rhythm.   Murmur heard.  Systolic murmur is present with a grade of 2/6  Pulmonary/Chest: Effort normal and breath sounds  normal. He has no wheezes. He has no rales.  Abdominal: Soft. Bowel sounds are normal. There is no tenderness.  Musculoskeletal: Normal range of motion. He exhibits tenderness. He exhibits no edema.  Scoliokyphosis, walking with walker with SBA. Chronic back pain. 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  Lymphadenopathy:    He has no cervical adenopathy.  Neurological: He is alert. He displays normal reflexes. No cranial nerve deficit. He exhibits normal muscle tone. Coordination normal.  Skin: Skin is warm and dry. No rash noted. No erythema.  AK top of head   Psychiatric: His mood appears not anxious. His affect is not angry, not blunt and not labile. His speech is not delayed and not slurred. He is not agitated, not aggressive, not slowed, not withdrawn, not actively hallucinating and not combative. Thought content is not paranoid and not delusional. Cognition and memory are impaired. He expresses impulsivity. He does not exhibit a depressed mood. He exhibits abnormal recent memory. He is attentive.    Labs reviewed: Basic Metabolic Panel:  Recent Labs  05/16/14 07/18/14  NA 138 137  K 4.1 4.1  BUN 34* 30*  CREATININE 2.3* 2.2*    Liver Function Tests:  Recent Labs  07/18/14  AST 12*  ALT 8*  ALKPHOS 82    CBC:  Recent Labs  05/27/14 07/18/14  WBC 8.1 7.2  HGB 13.1* 12.4*  HCT 40* 37*  PLT 249 223    Lab Results  Component Value Date   TSH 1.04 06/11/2013   Lab  Results  Component Value Date   HGBA1C 6.4* 08/10/2012   No results found for: CHOL, HDL, LDLCALC, LDLDIRECT, TRIG, CHOLHDL  Significant Diagnostic Results since last visit: none  Patient Care Team: Estill Dooms, MD as PCP - General (Internal Medicine)  Assessment/Plan Problem List Items Addressed This Visit    Hypertension - Primary (Chronic)    Controlled, continue Amlodipine 5mg        GERD (gastroesophageal reflux disease) (Chronic)    Stable, off Omeprazole, continue Zantac.       Depression    Stable, continue Effexor 75mg  and Mirtazapine 7.5mg  qhs. Sleeps well at night      Dementia with behavioral disturbance (Chronic)    Functioning well in SNF, stays in his room most of time, ambulates with walker, continue Namenda.      Constipation    Stable, but occasional constipated, continue Senokot S II bid and MOM q 3days-stable. Adding Doculax suppository prn.       CKD (chronic kidney disease) stage 3, GFR 30-59 ml/min (Chronic)    Baseline creatinine 2s, chronic       BPH (benign prostatic hyperplasia)    Last urinary retention, Foley inserted 11/04/12 at ED-successfully removed--continued Tamsulosin and Finasteride      Back pain    Back pain, controlled on Tylenol, off Oxycodone since 07/16/14          Family/ staff Communication: SNF for continue care needs.   Labs/tests ordered: none  ManXie Sherrill Buikema NP [facility] [address], [place] [address] On Call:  [phone] & follow prompts after 5pm & weekends Office Phone:  [phone] Office Fax:  [phone]

## 2015-02-21 NOTE — Assessment & Plan Note (Signed)
Back pain, controlled on Tylenol, off Oxycodone since 07/16/14 

## 2015-02-21 NOTE — Assessment & Plan Note (Signed)
Functioning well in SNF, stays in his room most of time, ambulates with walker, continue Namenda 

## 2015-02-21 NOTE — Assessment & Plan Note (Signed)
Last urinary retention, Foley inserted 11/04/12 at ED-successfully removed--continued Tamsulosin and Finasteride 

## 2015-02-21 NOTE — Assessment & Plan Note (Signed)
Stable, continue Effexor 75mg and Mirtazapine 7.5mg qhs. Sleeps well at night  

## 2015-02-21 NOTE — Assessment & Plan Note (Signed)
Stable, off Omeprazole, continue Zantac.  

## 2015-03-21 ENCOUNTER — Encounter: Payer: Self-pay | Admitting: Nurse Practitioner

## 2015-03-21 ENCOUNTER — Non-Acute Institutional Stay (SKILLED_NURSING_FACILITY): Payer: Medicare Other | Admitting: Nurse Practitioner

## 2015-03-21 DIAGNOSIS — N183 Chronic kidney disease, stage 3 unspecified: Secondary | ICD-10-CM

## 2015-03-21 DIAGNOSIS — I10 Essential (primary) hypertension: Secondary | ICD-10-CM

## 2015-03-21 DIAGNOSIS — F03918 Unspecified dementia, unspecified severity, with other behavioral disturbance: Secondary | ICD-10-CM

## 2015-03-21 DIAGNOSIS — K219 Gastro-esophageal reflux disease without esophagitis: Secondary | ICD-10-CM | POA: Diagnosis not present

## 2015-03-21 DIAGNOSIS — F0391 Unspecified dementia with behavioral disturbance: Secondary | ICD-10-CM | POA: Diagnosis not present

## 2015-03-21 DIAGNOSIS — F32A Depression, unspecified: Secondary | ICD-10-CM

## 2015-03-21 DIAGNOSIS — N400 Enlarged prostate without lower urinary tract symptoms: Secondary | ICD-10-CM | POA: Diagnosis not present

## 2015-03-21 DIAGNOSIS — M5440 Lumbago with sciatica, unspecified side: Secondary | ICD-10-CM | POA: Diagnosis not present

## 2015-03-21 DIAGNOSIS — F329 Major depressive disorder, single episode, unspecified: Secondary | ICD-10-CM

## 2015-03-21 DIAGNOSIS — K5900 Constipation, unspecified: Secondary | ICD-10-CM | POA: Diagnosis not present

## 2015-03-21 NOTE — Assessment & Plan Note (Signed)
Stable, continue Effexor 75mg and Mirtazapine 7.5mg qhs. Sleeps well at night  

## 2015-03-21 NOTE — Assessment & Plan Note (Signed)
Stable, off Omeprazole, continue Zantac.  

## 2015-03-21 NOTE — Assessment & Plan Note (Signed)
Back pain, controlled on Tylenol, off Oxycodone since 07/16/14 

## 2015-03-21 NOTE — Progress Notes (Signed)
Patient ID: Vincent Potts, male   DOB: 27-Feb-1925, 82 y.o.   MRN: [id number]  Location:  SNF [facility] Provider:  Marlana Latus NP  Code Status:  DNR Goals of care: Advanced Directive information    Chief Complaint  Patient presents with  . Medical Management of Chronic Issues     HPI: Patient is a 82 y.o. male seen in the SNF at [facility] today for evaluation of Hx of HTN controlled while on Amlodipine 5mg , GERD is managed on Zantac, off Omeprazole, dementia has been stabilized on Namenda, resides in SNF for care needs. Hx if chronic kidney disease with baseline creat 2s. He is forgetful, but able to voice his needs while on Namenda Hx of BPH with recurrent urinary retention is the past, last episode was about 2 years ago, taking Tamsulosin and Finasteride.   Review of Systems  Constitutional: Negative for fever and chills.  HENT: Positive for hearing loss. Negative for congestion, ear pain and nosebleeds.   Eyes: Negative for pain, discharge and redness.  Respiratory: Negative for cough, shortness of breath, wheezing and stridor.   Cardiovascular: Negative for chest pain, palpitations and leg swelling.  Gastrointestinal: Negative for nausea, vomiting, abdominal pain, diarrhea and constipation.  Genitourinary: Positive for frequency. Negative for dysuria, urgency and flank pain.  Musculoskeletal: Positive for back pain. Negative for myalgias and neck pain.  Skin: Negative for rash.  Neurological: Negative for dizziness, tremors, seizures, weakness and headaches.  Psychiatric/Behavioral: Positive for memory loss. The patient is not nervous/anxious and does not have insomnia.     Past Medical History  Diagnosis Date  . Thyroid disease   . Anemia   . Vitamin D deficiency disease   . Hyponatremia   . Anxiety   . Parkinsonism ([place])   . Hearing loss   . Hypertension   . PVC (premature ventricular contraction)   . CVA (cerebral infarction)   . GERD (gastroesophageal reflux disease)    . Diverticulosis   . Sciatica   . Back pain   . Gait disorder   . Cataract   . Edema   . Keratosis   . Sciatica   . Cystic disease of liver   . Paresthesia   . Inguinal hernia   . Hearing loss   . Stroke [facility]) 2010    no deficits  . Chronic kidney disease 2013    rhabdo  . H/O hiatal hernia 2008    repaired  . Depression 06/06/2012  . Dementia with behavioral disturbance 06/06/2012  . CKD (chronic kidney disease) stage 3, GFR 30-59 ml/min 08/29/2012    Creatinine 1.88 07/05/13(baseline 1.5-2.0) 02/11/14 creatinine 2.00 05/16/14 creatinine 2.31 07/18/14 creatinine 2.22       Patient Active Problem List   Diagnosis Date Noted  . AK (actinic keratosis) 03/19/2014  . Sinusitis, chronic 01/08/2014  . Insomnia 06/08/2013  . CKD (chronic kidney disease) stage 3, GFR 30-59 ml/min 08/29/2012  . Depression 06/06/2012  . Dementia with behavioral disturbance 06/06/2012  . Constipation 06/06/2012  . BPH (benign prostatic hyperplasia) 06/06/2012  . Nephrolithiasis 10/25/2011  . Fall 04/19/2011  . Parkinsonism ([place])   . CVA (cerebral infarction)   . Hypertension   . Back pain   . Hearing loss   . Gait disorder   . Diverticulosis   . GERD (gastroesophageal reflux disease)     Allergies  Allergen Reactions  . Olanzapine Other (See Comments)    unknown  . Rivastigmine Nausea Only    Medications: Patient's  Medications  New Prescriptions   No medications on file  Previous Medications   ACETAMINOPHEN (TYLENOL) 325 MG TABLET    Take 650 mg by mouth every 4 (four) hours as needed.   ACETAMINOPHEN (TYLENOL) 500 MG TABLET    Take 500 mg by mouth at bedtime. To be given with Restoril   AMLODIPINE (NORVASC) 5 MG TABLET    Take 1 tablet (5 mg total) by mouth every morning.   CARBOXYMETHYLCELLULOSE (REFRESH TEARS) 0.5 % SOLN    Place 1 drop into both eyes 3 (three) times daily.   CHOLECALCIFEROL (VITAMIN D) 1000 UNITS TABLET    Take 1,000 Units by mouth every morning.    CYCLOSPORINE  (RESTASIS) 0.05 % OPHTHALMIC EMULSION    Place 1 drop into both eyes 2 (two) times daily.    FEEDING SUPPLEMENT (ENSURE CLINICAL STRENGTH) LIQD    Take 237 mLs by mouth. Take one can daily as needed   FINASTERIDE (PROSCAR) 5 MG TABLET    Take 5 mg by mouth daily.   HYPROMELLOSE (GENTEAL OP)    Apply 1 drop to eye at bedtime.   MAGNESIUM HYDROXIDE (MILK OF MAGNESIA) 400 MG/5ML SUSPENSION    Take 30 mLs by mouth every 3 (three) days. Please take 30 ml by mouth daily prn for constipation.   MELATONIN 3 MG TABS    Take 3 mg by mouth at bedtime. Take one additional at bedtime for sleep   MEMANTINE HCL ER (NAMENDA XR) 28 MG CP24    Take 28 mg by mouth. Take one daily for memory   MIRTAZAPINE (REMERON) 7.5 MG TABLET    Take 7.5 mg by mouth at bedtime.   OXYCODONE (OXY-IR) 5 MG CAPSULE    Take 5 mg by mouth every 4 (four) hours as needed. pain   POTASSIUM CHLORIDE (K-DUR) 10 MEQ TABLET    Take 10 mEq by mouth daily.   RANITIDINE (ZANTAC) 150 MG TABLET    Take 150 mg by mouth at bedtime.   SENNA (SENOKOT) 8.6 MG TABLET    Take 2 tablets by mouth 2 (two) times daily.    SODIUM FLUORIDE (PREVIDENT 5000 PLUS) 1.1 % CREA DENTAL CREAM    Place 1 application onto teeth every evening.   TAMSULOSIN HCL (FLOMAX) 0.4 MG CAPS    Take 0.4 mg by mouth every morning.    VENLAFAXINE (EFFEXOR) 75 MG TABLET    Take 75 mg by mouth daily.  Modified Medications   No medications on file  Discontinued Medications   No medications on file    Physical Exam: Filed Vitals:   03/21/15 1443  BP: 118/62  Pulse: 76  Temp: 98.5 F (36.9 C)  TempSrc: Tympanic  Resp: 18   There is no weight on file to calculate BMI.  Physical Exam  Constitutional: He appears well-developed and well-nourished.  HENT:  Head: Normocephalic and atraumatic. Not macrocephalic.  Nose: No mucosal edema or rhinorrhea. Right sinus exhibits no maxillary sinus tenderness and no frontal sinus tenderness. Left sinus exhibits no maxillary sinus  tenderness and no frontal sinus tenderness.  Mouth/Throat: Uvula is midline. Posterior oropharyngeal erythema present. No oropharyngeal exudate or posterior oropharyngeal edema.  Eyes: EOM are normal. Pupils are equal, round, and reactive to light.  Neck: Normal range of motion. Neck supple. No JVD present. No thyromegaly present.  Cardiovascular: Normal rate and regular rhythm.   Murmur heard.  Systolic murmur is present with a grade of 2/6  Pulmonary/Chest: Effort normal and breath sounds  normal. He has no wheezes. He has no rales.  Abdominal: Soft. Bowel sounds are normal. There is no tenderness.  Musculoskeletal: Normal range of motion. He exhibits tenderness. He exhibits no edema.  Scoliokyphosis, walking with walker with SBA. Chronic back pain. 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  Lymphadenopathy:    He has no cervical adenopathy.  Neurological: He is alert. He displays normal reflexes. No cranial nerve deficit. He exhibits normal muscle tone. Coordination normal.  Skin: Skin is warm and dry. No rash noted. No erythema.  AK top of head   Psychiatric: His mood appears not anxious. His affect is not angry, not blunt and not labile. His speech is not delayed and not slurred. He is not agitated, not aggressive, not slowed, not withdrawn, not actively hallucinating and not combative. Thought content is not paranoid and not delusional. Cognition and memory are impaired. He expresses impulsivity. He does not exhibit a depressed mood. He exhibits abnormal recent memory. He is attentive.    Labs reviewed: Basic Metabolic Panel:  Recent Labs  05/16/14 07/18/14  NA 138 137  K 4.1 4.1  BUN 34* 30*  CREATININE 2.3* 2.2*    Liver Function Tests:  Recent Labs  07/18/14  AST 12*  ALT 8*  ALKPHOS 82    CBC:  Recent Labs  05/27/14 07/18/14  WBC 8.1 7.2  HGB 13.1* 12.4*  HCT 40* 37*  PLT 249 223    Lab Results  Component Value Date   TSH 1.04 06/11/2013   Lab  Results  Component Value Date   HGBA1C 6.4* 08/10/2012   No results found for: CHOL, HDL, LDLCALC, LDLDIRECT, TRIG, CHOLHDL  Significant Diagnostic Results since last visit: none  Patient Care Team: Estill Dooms, MD as PCP - General (Internal Medicine)  Assessment/Plan Problem List Items Addressed This Visit    Hypertension - Primary (Chronic)    Controlled, continue Amlodipine 5mg       GERD (gastroesophageal reflux disease) (Chronic)    Stable, off Omeprazole, continue Zantac.       Depression    Stable, continue Effexor 75mg  and Mirtazapine 7.5mg  qhs. Sleeps well at night      Dementia with behavioral disturbance (Chronic)    Functioning well in SNF, stays in his room most of time, ambulates with walker, continue Namenda      Constipation    Stable, but occasional constipated, continue Senokot S II bid and MOM q 3days, Doculax suppository prn.       CKD (chronic kidney disease) stage 3, GFR 30-59 ml/min (Chronic)    Baseline creatinine 2s, chronic      BPH (benign prostatic hyperplasia)    Last urinary retention, Foley inserted 11/04/12 at ED-successfully removed--continued Tamsulosin and Finasteride      Back pain    Back pain, controlled on Tylenol, off Oxycodone since 07/16/14          Family/ staff Communication: SNF for continue care needs.   Labs/tests ordered: none  Vincent Calyn Rubi NP [facility] [address], [place] [address] On Call:  [phone] & follow prompts after 5pm & weekends Office Phone:  [phone] Office Fax:  [phone]

## 2015-03-21 NOTE — Assessment & Plan Note (Signed)
Functioning well in SNF, stays in his room most of time, ambulates with walker, continue Namenda 

## 2015-03-21 NOTE — Assessment & Plan Note (Signed)
Baseline creatinine 2s, chronic  

## 2015-03-21 NOTE — Assessment & Plan Note (Signed)
Stable, but occasional constipated, continue Senokot S II bid and MOM q 3days, Doculax suppository prn.

## 2015-03-21 NOTE — Assessment & Plan Note (Signed)
Last urinary retention, Foley inserted 11/04/12 at ED-successfully removed--continued Tamsulosin and Finasteride 

## 2015-03-21 NOTE — Assessment & Plan Note (Signed)
Controlled, continue Amlodipine 5mg  

## 2015-05-08 ENCOUNTER — Non-Acute Institutional Stay (SKILLED_NURSING_FACILITY): Payer: Medicare Other | Admitting: Internal Medicine

## 2015-05-08 ENCOUNTER — Encounter: Payer: Self-pay | Admitting: Internal Medicine

## 2015-05-08 DIAGNOSIS — F329 Major depressive disorder, single episode, unspecified: Secondary | ICD-10-CM

## 2015-05-08 DIAGNOSIS — I10 Essential (primary) hypertension: Secondary | ICD-10-CM | POA: Diagnosis not present

## 2015-05-08 DIAGNOSIS — F0391 Unspecified dementia with behavioral disturbance: Secondary | ICD-10-CM

## 2015-05-08 DIAGNOSIS — M5440 Lumbago with sciatica, unspecified side: Secondary | ICD-10-CM

## 2015-05-08 DIAGNOSIS — N183 Chronic kidney disease, stage 3 unspecified: Secondary | ICD-10-CM

## 2015-05-08 DIAGNOSIS — F03918 Unspecified dementia, unspecified severity, with other behavioral disturbance: Secondary | ICD-10-CM

## 2015-05-08 DIAGNOSIS — F32A Depression, unspecified: Secondary | ICD-10-CM

## 2015-05-08 NOTE — Progress Notes (Signed)
Location:  Cool Room Number: N-8 Place of Service:  SNF (31) Provider:  Estill Dooms, MD  Patient Care Team: Estill Dooms, MD as PCP - General (Internal Medicine)  Extended Emergency Contact Information Primary Emergency Contact: Phillis Haggis [address] [place] Phone: [phone] Relation: Nephew  Code Status: DNR Goals of care: Advanced Directive information Advanced Directives 05/08/2015  Does patient have an advance directive? Yes  Type of Paramedic of [place];Out of facility DNR (pink MOST or yellow form)  Does patient want to make changes to advanced directive? No - Patient declined  Copy of advanced directive(s) in chart? Yes  Pre-existing out of facility DNR order (yellow form or pink MOST form) -     Chief Complaint  Patient presents with  . Medical Management of Chronic Issues    HPI:  Pt is a 82 y.o. male seen today for medical management of chronic diseases.  Patient is unwell for the last several months without significant acute problems.  Hypertension is controlled.  Depression stable  There has been slow progression in his dementia.  Back pains although they inhibit his walking are not too troublesome now.  Bladder issues and incontinence have improved.  CKD 3 is stable.   Past Medical History  Diagnosis Date  . Thyroid disease   . Anemia   . Vitamin D deficiency disease   . Hyponatremia   . Anxiety   . Parkinsonism ([place])   . Hearing loss   . Hypertension   . PVC (premature ventricular contraction)   . CVA (cerebral infarction)   . GERD (gastroesophageal reflux disease)   . Diverticulosis   . Sciatica   . Back pain   . Gait disorder   . Cataract   . Edema   . Keratosis   . Sciatica   . Cystic disease of liver   . Paresthesia   . Inguinal hernia   . Hearing loss   . Stroke [facility]) 2010    no deficits  . Chronic kidney disease 2013    rhabdo   . H/O hiatal hernia 2008    repaired  . Depression 06/06/2012  . Dementia with behavioral disturbance 06/06/2012  . CKD (chronic kidney disease) stage 3, GFR 30-59 ml/min 08/29/2012    Creatinine 1.88 07/05/13(baseline 1.5-2.0) 02/11/14 creatinine 2.00 05/16/14 creatinine 2.31 07/18/14 creatinine 2.22      Past Surgical History  Procedure Laterality Date  . Prostate surgery      cancer  . Hernia repair    . Appendectomy    . Esophagogastroduodenoscopy  10/25/2011    Procedure: ESOPHAGOGASTRODUODENOSCOPY (EGD);  Surgeon: Winfield Cunas., MD;  Location: [facility] ENDOSCOPY;  Service: Endoscopy;  Laterality: N/A;    Allergies  Allergen Reactions  . Olanzapine Other (See Comments)    unknown  . Rivastigmine Nausea Only      Medication List       This list is accurate as of: 05/08/15  4:28 PM.  Always use your most recent med list.               acetaminophen 500 MG tablet  Commonly known as:  TYLENOL  Take 500 mg by mouth at bedtime. To be given with Restoril     acetaminophen 325 MG tablet  Commonly known as:  TYLENOL  Take 650 mg by mouth every 4 (four) hours as needed.  amLODipine 5 MG tablet  Commonly known as:  NORVASC  Take 1 tablet (5 mg total) by mouth every morning.     cholecalciferol 1000 units tablet  Commonly known as:  VITAMIN D  Take 1,000 Units by mouth every morning.     cycloSPORINE 0.05 % ophthalmic emulsion  Commonly known as:  RESTASIS  Place 1 drop into both eyes 2 (two) times daily.     feeding supplement Liqd  Take 237 mLs by mouth. Take one can daily as needed     finasteride 5 MG tablet  Commonly known as:  PROSCAR  Take 5 mg by mouth daily.     Fish Oil 1000 MG Caps  Take 2 capsules by mouth daily.     GENTEAL OP  Apply 1 drop to eye at bedtime.     hydrocortisone cream 1 %  Apply 1 application topically. To the itchy area as needed.     loratadine 10 MG tablet  Commonly known as:  CLARITIN  Take 10 mg by mouth daily.     magnesium  hydroxide 400 MG/5ML suspension  Commonly known as:  MILK OF MAGNESIA  Take 30 mLs by mouth every 3 (three) days. Please take 30 ml by mouth daily prn for constipation.     Melatonin 3 MG Tabs  Take 3 mg by mouth at bedtime. Take one additional at bedtime for sleep     mirtazapine 7.5 MG tablet  Commonly known as:  REMERON  Take 7.5 mg by mouth at bedtime.     NAMENDA XR 28 MG Cp24 24 hr capsule  Generic drug:  memantine  Take 28 mg by mouth. Take one daily for memory     potassium chloride 10 MEQ tablet  Commonly known as:  K-DUR  Take 10 mEq by mouth daily.     PREVIDENT 5000 PLUS 1.1 % Crea dental cream  Generic drug:  sodium fluoride  Place 1 application onto teeth every evening.     ranitidine 150 MG tablet  Commonly known as:  ZANTAC  Take 150 mg by mouth at bedtime.     REFRESH TEARS 0.5 % Soln  Generic drug:  carboxymethylcellulose  Place 1 drop into both eyes 3 (three) times daily.     senna 8.6 MG tablet  Commonly known as:  SENOKOT  Take 2 tablets by mouth 2 (two) times daily.     sodium chloride 2 % ophthalmic solution  Commonly known as:  MURO 128  Place 1 drop into the left eye at bedtime.     tamsulosin 0.4 MG Caps capsule  Commonly known as:  FLOMAX  Take 0.4 mg by mouth every morning.     venlafaxine 75 MG tablet  Commonly known as:  EFFEXOR  Take 75 mg by mouth daily.        Review of Systems  Constitutional: Negative for fever, chills and diaphoresis.  HENT: Positive for congestion, hearing loss and sinus pressure. Negative for ear discharge, ear pain, nosebleeds, postnasal drip, rhinorrhea, sore throat and tinnitus.        Nasal congestion at night  Eyes: Negative for photophobia, pain, discharge and redness.  Respiratory: Negative for cough, shortness of breath, wheezing and stridor.   Cardiovascular: Negative for chest pain, palpitations and leg swelling.  Gastrointestinal: Negative for nausea, vomiting, abdominal pain, diarrhea,  constipation and blood in stool.  Endocrine: Negative for polydipsia.  Genitourinary: Positive for frequency. Negative for dysuria, urgency, hematuria and flank pain.  Musculoskeletal:  Positive for back pain and gait problem. Negative for myalgias and neck pain.  Skin: Negative for rash.  Allergic/Immunologic: Negative for environmental allergies.  Neurological: Negative for dizziness, tremors, seizures, weakness and headaches.  Hematological: Does not bruise/bleed easily.  Psychiatric/Behavioral: Negative for suicidal ideas and hallucinations. The patient is not nervous/anxious.     Immunization History  Administered Date(s) Administered  . Influenza Whole 12/20/2012  . Influenza-Unspecified 12/06/2013, 12/06/2014  . Tdap 10/24/2011   Pertinent  Health Maintenance Due  Topic Date Due  . PNA vac Low Risk Adult (1 of 2 - PCV13) 04/06/1991  . INFLUENZA VACCINE  09/30/2015   Fall Risk  09/12/2014 05/23/2014  Falls in the past year? Yes No  Number falls in past yr: 1 -  Injury with Fall? No -  Risk for fall due to : - History of fall(s);Impaired balance/gait   Functional Status Survey:    Filed Vitals:   05/08/15 1610  BP: 158/90  Pulse: 84  Temp: 98.5 F (36.9 C)  TempSrc: Oral  Resp: 20  SpO2: 92%   There is no weight on file to calculate BMI. Physical Exam  Constitutional: He appears well-developed and well-nourished.  HENT:  Head: Normocephalic and atraumatic. Not macrocephalic.  Nose: No mucosal edema or rhinorrhea. Right sinus exhibits no maxillary sinus tenderness and no frontal sinus tenderness. Left sinus exhibits no maxillary sinus tenderness and no frontal sinus tenderness.  Mouth/Throat: Uvula is midline. Posterior oropharyngeal erythema present. No oropharyngeal exudate or posterior oropharyngeal edema.  Eyes: EOM are normal. Pupils are equal, round, and reactive to light.  Neck: Normal range of motion. Neck supple. No JVD present. No thyromegaly present.    Cardiovascular: Normal rate and regular rhythm.   Murmur heard.  Systolic murmur is present with a grade of 2/6  Pulmonary/Chest: Effort normal and breath sounds normal. He has no wheezes. He has no rales.  Abdominal: Soft. Bowel sounds are normal. There is no tenderness.  Musculoskeletal: Normal range of motion. He exhibits tenderness. He exhibits no edema.  Scoliokyphosis, walking with walker with SBA. Chronic back pain. 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  Lymphadenopathy:    He has no cervical adenopathy.  Neurological: He is alert. He displays normal reflexes. No cranial nerve deficit. He exhibits normal muscle tone. Coordination normal.  Skin: Skin is warm and dry. No rash noted. No erythema.  AK top of head  Psychiatric: His mood appears not anxious. His affect is not angry, not blunt and not labile. His speech is not delayed and not slurred. He is not agitated, not aggressive, not slowed, not withdrawn, not actively hallucinating and not combative. Thought content is not paranoid and not delusional. Cognition and memory are impaired. He expresses impulsivity. He does not exhibit a depressed mood. He exhibits abnormal recent memory. He is attentive.    Labs reviewed:  Recent Labs  05/16/14 07/18/14  NA 138 137  K 4.1 4.1  BUN 34* 30*  CREATININE 2.3* 2.2*    Recent Labs  07/18/14  AST 12*  ALT 8*  ALKPHOS 82    Recent Labs  05/27/14 07/18/14  WBC 8.1 7.2  HGB 13.1* 12.4*  HCT 40* 37*  PLT 249 223   Lab Results  Component Value Date   TSH 1.04 06/11/2013   Lab Results  Component Value Date   HGBA1C 6.4* 08/10/2012    Assessment/Plan 1. Dementia with behavioral disturbance Slowly worsening  2. Essential hypertension Controlled  3. Depression In  remission  4. Midline low back pain with sciatica, sciatica laterality unspecified Stable  5. CKD (chronic kidney disease) stage 3, GFR 30-59 ml/min Stable

## 2015-06-10 ENCOUNTER — Non-Acute Institutional Stay (SKILLED_NURSING_FACILITY): Payer: Medicare Other | Admitting: Nurse Practitioner

## 2015-06-10 ENCOUNTER — Encounter: Payer: Self-pay | Admitting: Nurse Practitioner

## 2015-06-10 DIAGNOSIS — N400 Enlarged prostate without lower urinary tract symptoms: Secondary | ICD-10-CM | POA: Diagnosis not present

## 2015-06-10 DIAGNOSIS — N183 Chronic kidney disease, stage 3 unspecified: Secondary | ICD-10-CM

## 2015-06-10 DIAGNOSIS — R269 Unspecified abnormalities of gait and mobility: Secondary | ICD-10-CM | POA: Diagnosis not present

## 2015-06-10 DIAGNOSIS — F0391 Unspecified dementia with behavioral disturbance: Secondary | ICD-10-CM | POA: Diagnosis not present

## 2015-06-10 DIAGNOSIS — F329 Major depressive disorder, single episode, unspecified: Secondary | ICD-10-CM

## 2015-06-10 DIAGNOSIS — I10 Essential (primary) hypertension: Secondary | ICD-10-CM

## 2015-06-10 DIAGNOSIS — F32A Depression, unspecified: Secondary | ICD-10-CM

## 2015-06-10 DIAGNOSIS — K5900 Constipation, unspecified: Secondary | ICD-10-CM | POA: Diagnosis not present

## 2015-06-10 DIAGNOSIS — M5440 Lumbago with sciatica, unspecified side: Secondary | ICD-10-CM | POA: Diagnosis not present

## 2015-06-10 DIAGNOSIS — K219 Gastro-esophageal reflux disease without esophagitis: Secondary | ICD-10-CM | POA: Diagnosis not present

## 2015-06-10 DIAGNOSIS — F03918 Unspecified dementia, unspecified severity, with other behavioral disturbance: Secondary | ICD-10-CM

## 2015-06-10 NOTE — Assessment & Plan Note (Signed)
Stable, off Omeprazole, continue Zantac.  

## 2015-06-10 NOTE — Assessment & Plan Note (Signed)
Back pain, controlled on Tylenol, off Oxycodone since 07/16/14 

## 2015-06-10 NOTE — Progress Notes (Signed)
Patient ID: Vincent Potts, male   DOB: 1924-12-08, 82 y.o.   MRN: [id number]  Location:  [place] Room Number: 8 Place of Service:  SNF (31) Provider: Lennie Odor Stephany Poorman NP  GREEN, Viviann Spare, MD  Patient Care Team: Estill Dooms, MD as PCP - General (Internal Medicine)  Extended Emergency Contact Information Primary Emergency Contact: Phillis Haggis [address] [place] Phone: [phone] Relation: Nephew  Code Status:  DNR Goals of care: Advanced Directive information Advanced Directives 06/10/2015  Does patient have an advance directive? Yes  Type of Paramedic of [place];Out of facility DNR (pink MOST or yellow form)  Does patient want to make changes to advanced directive? No - Patient declined  Copy of advanced directive(s) in chart? Yes     Chief Complaint  Patient presents with  . Medical Management of Chronic Issues    Routine Visit    HPI: Pt is a 82 y.o. male seen today for medical management of chronic diseases.  Hx of HTN controlled while on Amlodipine 5mg , GERD is managed on Zantac, off Omeprazole, dementia has been stabilized on Namenda, resides in SNF for care needs. Hx if chronic kidney disease with baseline creat 2s. He is forgetful, but able to voice his needs while on Namenda Hx of BPH with recurrent urinary retention is the past, last episode was about 2 years ago, taking Tamsulosin and Finasteride.    Past Medical History  Diagnosis Date  . Thyroid disease   . Anemia   . Vitamin D deficiency disease   . Hyponatremia   . Anxiety   . Parkinsonism ([place])   . Hearing loss   . Hypertension   . PVC (premature ventricular contraction)   . CVA (cerebral infarction)   . GERD (gastroesophageal reflux disease)   . Diverticulosis   . Sciatica   . Back pain   . Gait disorder   . Cataract   . Edema   . Keratosis   . Sciatica   . Cystic disease of liver   . Paresthesia   . Inguinal  hernia   . Hearing loss   . Stroke [facility]) 2010    no deficits  . Chronic kidney disease 2013    rhabdo  . H/O hiatal hernia 2008    repaired  . Depression 06/06/2012  . Dementia with behavioral disturbance 06/06/2012  . CKD (chronic kidney disease) stage 3, GFR 30-59 ml/min 08/29/2012    Creatinine 1.88 07/05/13(baseline 1.5-2.0) 02/11/14 creatinine 2.00 05/16/14 creatinine 2.31 07/18/14 creatinine 2.22      Past Surgical History  Procedure Laterality Date  . Prostate surgery      cancer  . Hernia repair    . Appendectomy    . Esophagogastroduodenoscopy  10/25/2011    Procedure: ESOPHAGOGASTRODUODENOSCOPY (EGD);  Surgeon: Winfield Cunas., MD;  Location: [facility] ENDOSCOPY;  Service: Endoscopy;  Laterality: N/A;    Allergies  Allergen Reactions  . Olanzapine Other (See Comments)    unknown  . Rivastigmine Nausea Only      Medication List       This list is accurate as of: 06/10/15  4:43 PM.  Always use your most recent med list.               acetaminophen 500 MG tablet  Commonly known as:  TYLENOL  Take 500 mg by mouth at bedtime. To be given with Restoril  acetaminophen 325 MG tablet  Commonly known as:  TYLENOL  Take 650 mg by mouth every 4 (four) hours as needed.     amLODipine 5 MG tablet  Commonly known as:  NORVASC  Take 1 tablet (5 mg total) by mouth every morning.     bisacodyl 10 MG suppository  Commonly known as:  DULCOLAX  Place 10 mg rectally daily as needed for moderate constipation.     cholecalciferol 1000 units tablet  Commonly known as:  VITAMIN D  Take 1,000 Units by mouth every morning.     feeding supplement Liqd  Take 237 mLs by mouth. Take one can daily as needed     finasteride 5 MG tablet  Commonly known as:  PROSCAR  Take 5 mg by mouth daily.     Fish Oil 1000 MG Caps  Take 2 capsules by mouth daily.     fluticasone 50 MCG/ACT nasal spray  Commonly known as:  FLONASE  Place 1 spray into both nostrils at bedtime.     loteprednol  0.5 % ophthalmic suspension  Commonly known as:  LOTEMAX  Place 1 drop into the left eye daily.     magnesium hydroxide 400 MG/5ML suspension  Commonly known as:  MILK OF MAGNESIA  Take 30 mLs by mouth every 3 (three) days. Please take 30 ml by mouth daily prn for constipation.     Melatonin 3 MG Tabs  Take 3 mg by mouth at bedtime. Take one additional at bedtime for sleep     mirtazapine 7.5 MG tablet  Commonly known as:  REMERON  Take 7.5 mg by mouth at bedtime.     NAMENDA XR 28 MG Cp24 24 hr capsule  Generic drug:  memantine  Take 28 mg by mouth. Take one daily for memory     potassium chloride 10 MEQ tablet  Commonly known as:  K-DUR  Take 10 mEq by mouth daily.     PREVIDENT 5000 PLUS 1.1 % Crea dental cream  Generic drug:  sodium fluoride  Place 1 application onto teeth every evening.     ranitidine 150 MG tablet  Commonly known as:  ZANTAC  Take 150 mg by mouth at bedtime.     senna 8.6 MG tablet  Commonly known as:  SENOKOT  Take 2 tablets by mouth 2 (two) times daily.     sodium chloride 2 % ophthalmic solution  Commonly known as:  MURO 128  Place 1 drop into the left eye at bedtime.     SYSTANE 0.4-0.3 % Soln  Generic drug:  Polyethyl Glycol-Propyl Glycol  Place 1 drop into both eyes 3 (three) times daily.     tamsulosin 0.4 MG Caps capsule  Commonly known as:  FLOMAX  Take 0.4 mg by mouth every morning.     venlafaxine 75 MG tablet  Commonly known as:  EFFEXOR  Take 75 mg by mouth daily.        Review of Systems  Constitutional: Negative for fever, chills and diaphoresis.  HENT: Positive for congestion, hearing loss and sinus pressure. Negative for ear discharge, ear pain, nosebleeds, postnasal drip, rhinorrhea, sore throat and tinnitus.        Nasal congestion at night  Eyes: Negative for photophobia, pain, discharge and redness.  Respiratory: Negative for cough, shortness of breath, wheezing and stridor.   Cardiovascular: Negative for chest  pain, palpitations and leg swelling.  Gastrointestinal: Negative for nausea, vomiting, abdominal pain, diarrhea, constipation and blood in stool.  Endocrine: Negative for polydipsia.  Genitourinary: Positive for frequency. Negative for dysuria, urgency, hematuria and flank pain.  Musculoskeletal: Positive for back pain and gait problem. Negative for myalgias and neck pain.  Skin: Negative for rash.  Allergic/Immunologic: Negative for environmental allergies.  Neurological: Negative for dizziness, tremors, seizures, weakness and headaches.  Hematological: Does not bruise/bleed easily.  Psychiatric/Behavioral: Negative for suicidal ideas and hallucinations. The patient is not nervous/anxious.     Immunization History  Administered Date(s) Administered  . Influenza Whole 12/20/2012  . Influenza-Unspecified 12/06/2013, 12/06/2014  . Tdap 10/24/2011   Pertinent  Health Maintenance Due  Topic Date Due  . PNA vac Low Risk Adult (1 of 2 - PCV13) 04/06/1991  . INFLUENZA VACCINE  09/30/2015   Fall Risk  09/12/2014 05/23/2014  Falls in the past year? Yes No  Number falls in past yr: 1 -  Injury with Fall? No -  Risk for fall due to : - History of fall(s);Impaired balance/gait   Functional Status Survey:    Filed Vitals:   06/10/15 1028  BP: 140/70  Pulse: 79  Temp: 97.9 F (36.6 C)  TempSrc: Oral  Resp: 20  Height: 5' (1.524 m)  Weight: 149 lb (67.586 kg)  SpO2: 93%   Body mass index is 29.1 kg/(m^2). Physical Exam  Constitutional: He appears well-developed and well-nourished.  HENT:  Head: Normocephalic and atraumatic. Not macrocephalic.  Nose: No mucosal edema or rhinorrhea. Right sinus exhibits no maxillary sinus tenderness and no frontal sinus tenderness. Left sinus exhibits no maxillary sinus tenderness and no frontal sinus tenderness.  Mouth/Throat: Uvula is midline. Posterior oropharyngeal erythema present. No oropharyngeal exudate or posterior oropharyngeal edema.  Eyes:  EOM are normal. Pupils are equal, round, and reactive to light.  Neck: Normal range of motion. Neck supple. No JVD present. No thyromegaly present.  Cardiovascular: Normal rate and regular rhythm.   Murmur heard.  Systolic murmur is present with a grade of 2/6  Pulmonary/Chest: Effort normal and breath sounds normal. He has no wheezes. He has no rales.  Abdominal: Soft. Bowel sounds are normal. There is no tenderness.  Musculoskeletal: Normal range of motion. He exhibits tenderness. He exhibits no edema.  Scoliokyphosis, walking with walker with SBA. Chronic back pain. 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  Lymphadenopathy:    He has no cervical adenopathy.  Neurological: He is alert. He displays normal reflexes. No cranial nerve deficit. He exhibits normal muscle tone. Coordination normal.  Skin: Skin is warm and dry. No rash noted. No erythema.  AK top of head  Psychiatric: His mood appears not anxious. His affect is not angry, not blunt and not labile. His speech is not delayed and not slurred. He is not agitated, not aggressive, not slowed, not withdrawn, not actively hallucinating and not combative. Thought content is not paranoid and not delusional. Cognition and memory are impaired. He expresses impulsivity. He does not exhibit a depressed mood. He exhibits abnormal recent memory. He is attentive.    Labs reviewed:  Recent Labs  07/18/14  NA 137  K 4.1  BUN 30*  CREATININE 2.2*    Recent Labs  07/18/14  AST 12*  ALT 8*  ALKPHOS 82    Recent Labs  07/18/14  WBC 7.2  HGB 12.4*  HCT 37*  PLT 223   Lab Results  Component Value Date   TSH 1.04 06/11/2013   Lab Results  Component Value Date   HGBA1C 6.4* 08/10/2012   No results  found for: CHOL, HDL, LDLCALC, LDLDIRECT, TRIG, CHOLHDL  Significant Diagnostic Results in last 30 days:  No results found.  Assessment/Plan BPH (benign prostatic hyperplasia) Last urinary retention, Foley inserted 11/04/12 at  ED-successfully removed--continued Tamsulosin and Finasteride   Back pain Back pain, controlled on Tylenol, off Oxycodone since 07/16/14  CKD (chronic kidney disease) stage 3, GFR 30-59 ml/min Baseline creatinine 2s, chronic, update CBC, CMP, TSH, Hgb a1c  Constipation Baseline creatinine 2s, chronic  Dementia with behavioral disturbance Functioning well in SNF, stays in his room most of time, ambulates with walker, continue Namenda   Depression Stable, continue Effexor 75mg  and Mirtazapine 7.5mg  qhs. Sleeps well at night   Gait disorder Ambulates with walker   GERD (gastroesophageal reflux disease) Stable, off Omeprazole, continue Zantac.   Hypertension Controlled, continue Amlodipine 5mg     Family/ staff Communication: SNF for care needs  Labs/tests ordered:  CBC, CMP, TSH, Hgb a1c

## 2015-06-10 NOTE — Assessment & Plan Note (Signed)
Controlled, continue Amlodipine 5mg  

## 2015-06-10 NOTE — Assessment & Plan Note (Signed)
Functioning well in SNF, stays in his room most of time, ambulates with walker, continue Namenda 

## 2015-06-10 NOTE — Assessment & Plan Note (Signed)
Ambulates with walker.  

## 2015-06-10 NOTE — Assessment & Plan Note (Signed)
Baseline creatinine 2s, chronic  

## 2015-06-10 NOTE — Assessment & Plan Note (Signed)
Stable, continue Effexor 75mg and Mirtazapine 7.5mg qhs. Sleeps well at night  

## 2015-06-10 NOTE — Assessment & Plan Note (Addendum)
Baseline creatinine 2s, chronic, update CBC, CMP, TSH, Hgb a1c

## 2015-06-10 NOTE — Assessment & Plan Note (Signed)
Last urinary retention, Foley inserted 11/04/12 at ED-successfully removed--continued Tamsulosin and Finasteride 

## 2015-06-12 ENCOUNTER — Encounter: Payer: Self-pay | Admitting: Nurse Practitioner

## 2015-06-12 DIAGNOSIS — I10 Essential (primary) hypertension: Secondary | ICD-10-CM | POA: Diagnosis not present

## 2015-06-12 DIAGNOSIS — E119 Type 2 diabetes mellitus without complications: Secondary | ICD-10-CM | POA: Diagnosis not present

## 2015-06-12 DIAGNOSIS — N189 Chronic kidney disease, unspecified: Secondary | ICD-10-CM

## 2015-06-12 DIAGNOSIS — E039 Hypothyroidism, unspecified: Secondary | ICD-10-CM | POA: Diagnosis not present

## 2015-06-12 DIAGNOSIS — D631 Anemia in chronic kidney disease: Secondary | ICD-10-CM | POA: Insufficient documentation

## 2015-06-12 DIAGNOSIS — D649 Anemia, unspecified: Secondary | ICD-10-CM | POA: Diagnosis not present

## 2015-06-12 LAB — CBC AND DIFFERENTIAL
HCT: 31 % — AB (ref 41–53)
Hemoglobin: 10.1 g/dL — AB (ref 13.5–17.5)
Platelets: 204 10*3/uL (ref 150–399)
WBC: 9.1 10^3/mL

## 2015-06-12 LAB — BASIC METABOLIC PANEL
BUN: 42 mg/dL — AB (ref 4–21)
CREATININE: 2.6 mg/dL — AB (ref 0.6–1.3)
GLUCOSE: 83 mg/dL
POTASSIUM: 4.3 mmol/L (ref 3.4–5.3)
SODIUM: 138 mmol/L (ref 137–147)

## 2015-06-12 LAB — TSH: TSH: 1.09 u[IU]/mL (ref 0.41–5.90)

## 2015-06-12 LAB — HEPATIC FUNCTION PANEL
ALT: 8 U/L — AB (ref 10–40)
AST: 12 U/L — AB (ref 14–40)
Alkaline Phosphatase: 76 U/L (ref 25–125)
Bilirubin, Total: 0.4 mg/dL

## 2015-06-12 LAB — HEMOGLOBIN A1C: Hemoglobin A1C: 5.3

## 2015-07-15 ENCOUNTER — Non-Acute Institutional Stay (SKILLED_NURSING_FACILITY): Payer: Medicare Other | Admitting: Nurse Practitioner

## 2015-07-15 ENCOUNTER — Encounter: Payer: Self-pay | Admitting: Nurse Practitioner

## 2015-07-15 DIAGNOSIS — F329 Major depressive disorder, single episode, unspecified: Secondary | ICD-10-CM | POA: Diagnosis not present

## 2015-07-15 DIAGNOSIS — K5900 Constipation, unspecified: Secondary | ICD-10-CM | POA: Diagnosis not present

## 2015-07-15 DIAGNOSIS — K219 Gastro-esophageal reflux disease without esophagitis: Secondary | ICD-10-CM

## 2015-07-15 DIAGNOSIS — I10 Essential (primary) hypertension: Secondary | ICD-10-CM

## 2015-07-15 DIAGNOSIS — F0391 Unspecified dementia with behavioral disturbance: Secondary | ICD-10-CM | POA: Diagnosis not present

## 2015-07-15 DIAGNOSIS — M5440 Lumbago with sciatica, unspecified side: Secondary | ICD-10-CM | POA: Diagnosis not present

## 2015-07-15 DIAGNOSIS — N400 Enlarged prostate without lower urinary tract symptoms: Secondary | ICD-10-CM | POA: Diagnosis not present

## 2015-07-15 DIAGNOSIS — N183 Chronic kidney disease, stage 3 unspecified: Secondary | ICD-10-CM

## 2015-07-15 DIAGNOSIS — R269 Unspecified abnormalities of gait and mobility: Secondary | ICD-10-CM | POA: Diagnosis not present

## 2015-07-15 DIAGNOSIS — F03918 Unspecified dementia, unspecified severity, with other behavioral disturbance: Secondary | ICD-10-CM

## 2015-07-15 DIAGNOSIS — D631 Anemia in chronic kidney disease: Secondary | ICD-10-CM

## 2015-07-15 DIAGNOSIS — F32A Depression, unspecified: Secondary | ICD-10-CM

## 2015-07-15 DIAGNOSIS — N189 Chronic kidney disease, unspecified: Secondary | ICD-10-CM | POA: Diagnosis not present

## 2015-07-15 NOTE — Assessment & Plan Note (Addendum)
Baseline creatinine 2s, chronic, 06/12/15 Na 138, K 4.3, Bun 42, creat 2.62

## 2015-07-15 NOTE — Assessment & Plan Note (Signed)
Functioning well in SNF, stays in his room most of time, ambulates with walker, continue Namenda 

## 2015-07-15 NOTE — Assessment & Plan Note (Signed)
Last urinary retention, Foley inserted 11/04/12 at ED-successfully removed--continued Tamsulosin and Finasteride 

## 2015-07-15 NOTE — Progress Notes (Signed)
Patient ID: Vincent Potts, male   DOB: 03-Jun-1924, 82 y.o.   MRN: [id number]  Location:  [place] Room Number: N 8 Place of Service:  SNF (31) Provider: Lennie Odor Laylynn Campanella NP  GREEN, Viviann Spare, MD  Patient Care Team: Estill Dooms, MD as PCP - General (Internal Medicine)  Extended Emergency Contact Information Primary Emergency Contact: Phillis Haggis [address] [place] Phone: [phone] Relation: Nephew  Code Status:  DNR Goals of care: Advanced Directive information Advanced Directives 07/15/2015  Does patient have an advance directive? Yes  Type of Paramedic of [place];Out of facility DNR (pink MOST or yellow form)  Does patient want to make changes to advanced directive? No - Patient declined  Copy of advanced directive(s) in chart? Yes     Chief Complaint  Patient presents with  . Medical Management of Chronic Issues    Routine Visit    HPI: Pt is a 82 y.o. male seen today for medical management of chronic diseases.  Hx of HTN controlled while on Amlodipine 5mg , GERD is managed on Zantac, off Omeprazole, dementia has been stabilized on Namenda, resides in SNF for care needs. Hx if chronic kidney disease with baseline creat 2s. He is forgetful, but able to voice his needs while on Namenda Hx of BPH with recurrent urinary retention is the past, last episode was about 2 years ago, taking Tamsulosin and Finasteride.    Past Medical History  Diagnosis Date  . Thyroid disease   . Anemia   . Vitamin D deficiency disease   . Hyponatremia   . Anxiety   . Parkinsonism ([place])   . Hearing loss   . Hypertension   . PVC (premature ventricular contraction)   . CVA (cerebral infarction)   . GERD (gastroesophageal reflux disease)   . Diverticulosis   . Sciatica   . Back pain   . Gait disorder   . Cataract   . Edema   . Keratosis   . Sciatica   . Cystic disease of liver   . Paresthesia   . Inguinal  hernia   . Hearing loss   . Stroke [facility]) 2010    no deficits  . Chronic kidney disease 2013    rhabdo  . H/O hiatal hernia 2008    repaired  . Depression 06/06/2012  . Dementia with behavioral disturbance 06/06/2012  . CKD (chronic kidney disease) stage 3, GFR 30-59 ml/min 08/29/2012    Creatinine 1.88 07/05/13(baseline 1.5-2.0) 02/11/14 creatinine 2.00 05/16/14 creatinine 2.31 07/18/14 creatinine 2.22      Past Surgical History  Procedure Laterality Date  . Prostate surgery      cancer  . Hernia repair    . Appendectomy    . Esophagogastroduodenoscopy  10/25/2011    Procedure: ESOPHAGOGASTRODUODENOSCOPY (EGD);  Surgeon: Winfield Cunas., MD;  Location: [facility] ENDOSCOPY;  Service: Endoscopy;  Laterality: N/A;    Allergies  Allergen Reactions  . Olanzapine Other (See Comments)    unknown  . Rivastigmine Nausea Only      Medication List       This list is accurate as of: 07/15/15  1:12 PM.  Always use your most recent med list.               acetaminophen 500 MG tablet  Commonly known as:  TYLENOL  Take 500 mg by mouth at bedtime.     acetaminophen  325 MG tablet  Commonly known as:  TYLENOL  Take 650 mg by mouth every 4 (four) hours as needed.     amLODipine 5 MG tablet  Commonly known as:  NORVASC  Take 1 tablet (5 mg total) by mouth every morning.     bisacodyl 10 MG suppository  Commonly known as:  DULCOLAX  Place 10 mg rectally daily as needed for moderate constipation.     cholecalciferol 1000 units tablet  Commonly known as:  VITAMIN D  Take 1,000 Units by mouth every morning.     feeding supplement Liqd  Take 237 mLs by mouth. Take one can daily as needed     finasteride 5 MG tablet  Commonly known as:  PROSCAR  Take 5 mg by mouth daily.     Fish Oil 1000 MG Caps  Take 2 capsules by mouth daily.     fluticasone 50 MCG/ACT nasal spray  Commonly known as:  FLONASE  Place 1 spray into both nostrils at bedtime.     loratadine 10 MG tablet  Commonly known  as:  CLARITIN  Take 10 mg by mouth daily as needed for allergies or rhinitis.     loteprednol 0.5 % ophthalmic suspension  Commonly known as:  LOTEMAX  Place 1 drop into the left eye daily.     magnesium hydroxide 400 MG/5ML suspension  Commonly known as:  MILK OF MAGNESIA  Take 30 mLs by mouth every 3 (three) days. Please take 30 ml by mouth daily prn for constipation.     Melatonin 3 MG Tabs  Take 3 mg by mouth at bedtime.     mirtazapine 7.5 MG tablet  Commonly known as:  REMERON  Take 7.5 mg by mouth at bedtime.     NAMENDA XR 28 MG Cp24 24 hr capsule  Generic drug:  memantine  Take one daily for memory     potassium chloride 10 MEQ tablet  Commonly known as:  K-DUR  Take 10 mEq by mouth daily.     PREVIDENT 5000 PLUS 1.1 % Crea dental cream  Generic drug:  sodium fluoride  Place 1 application onto teeth every evening.     ranitidine 75 MG tablet  Commonly known as:  ZANTAC  Take 75 mg by mouth daily as needed for heartburn.     senna 8.6 MG tablet  Commonly known as:  SENOKOT  Take 2 tablets by mouth 2 (two) times daily.     sodium chloride 2 % ophthalmic solution  Commonly known as:  MURO 128  Place 1 drop into the left eye at bedtime.     SYSTANE 0.4-0.3 % Soln  Generic drug:  Polyethyl Glycol-Propyl Glycol  Place 1 drop into both eyes 3 (three) times daily.     tamsulosin 0.4 MG Caps capsule  Commonly known as:  FLOMAX  Take 0.4 mg by mouth every morning.     venlafaxine 75 MG tablet  Commonly known as:  EFFEXOR  Take 75 mg by mouth daily.        Review of Systems  Constitutional: Negative for fever, chills and diaphoresis.  HENT: Positive for congestion, hearing loss and sinus pressure. Negative for ear discharge, ear pain, nosebleeds, postnasal drip, rhinorrhea, sore throat and tinnitus.        Nasal congestion at night  Eyes: Negative for photophobia, pain, discharge and redness.  Respiratory: Negative for cough, shortness of breath, wheezing  and stridor.   Cardiovascular: Negative for chest pain, palpitations  and leg swelling.  Gastrointestinal: Negative for nausea, vomiting, abdominal pain, diarrhea, constipation and blood in stool.  Endocrine: Negative for polydipsia.  Genitourinary: Positive for frequency. Negative for dysuria, urgency, hematuria and flank pain.  Musculoskeletal: Positive for back pain and gait problem. Negative for myalgias and neck pain.  Skin: Negative for rash.  Allergic/Immunologic: Negative for environmental allergies.  Neurological: Negative for dizziness, tremors, seizures, weakness and headaches.  Hematological: Does not bruise/bleed easily.  Psychiatric/Behavioral: Negative for suicidal ideas and hallucinations. The patient is not nervous/anxious.     Immunization History  Administered Date(s) Administered  . Influenza Whole 12/20/2012  . Influenza-Unspecified 12/06/2013, 12/06/2014  . Tdap 10/24/2011   Pertinent  Health Maintenance Due  Topic Date Due  . PNA vac Low Risk Adult (1 of 2 - PCV13) 04/06/1991  . INFLUENZA VACCINE  09/30/2015   Fall Risk  09/12/2014 05/23/2014  Falls in the past year? Yes No  Number falls in past yr: 1 -  Injury with Fall? No -  Risk for fall due to : - History of fall(s);Impaired balance/gait   Functional Status Survey:    Filed Vitals:   07/15/15 0910  BP: 148/80  Pulse: 80  Temp: 97.2 F (36.2 C)  TempSrc: Oral  Resp: 20  Height: 5' (1.524 m)  Weight: 154 lb (69.854 kg)  SpO2: 97%   Body mass index is 30.08 kg/(m^2). Physical Exam  Constitutional: He appears well-developed and well-nourished.  HENT:  Head: Normocephalic and atraumatic. Not macrocephalic.  Nose: No mucosal edema or rhinorrhea. Right sinus exhibits no maxillary sinus tenderness and no frontal sinus tenderness. Left sinus exhibits no maxillary sinus tenderness and no frontal sinus tenderness.  Mouth/Throat: Uvula is midline. Posterior oropharyngeal erythema present. No  oropharyngeal exudate or posterior oropharyngeal edema.  Eyes: EOM are normal. Pupils are equal, round, and reactive to light.  Neck: Normal range of motion. Neck supple. No JVD present. No thyromegaly present.  Cardiovascular: Normal rate and regular rhythm.   Murmur heard.  Systolic murmur is present with a grade of 2/6  Pulmonary/Chest: Effort normal and breath sounds normal. He has no wheezes. He has no rales.  Abdominal: Soft. Bowel sounds are normal. There is no tenderness.  Musculoskeletal: Normal range of motion. He exhibits tenderness. He exhibits no edema.  Scoliokyphosis, walking with walker with SBA. Chronic back pain. 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  Lymphadenopathy:    He has no cervical adenopathy.  Neurological: He is alert. He displays normal reflexes. No cranial nerve deficit. He exhibits normal muscle tone. Coordination normal.  Skin: Skin is warm and dry. No rash noted. No erythema.  AK top of head  Psychiatric: His mood appears not anxious. His affect is not angry, not blunt and not labile. His speech is not delayed and not slurred. He is not agitated, not aggressive, not slowed, not withdrawn, not actively hallucinating and not combative. Thought content is not paranoid and not delusional. Cognition and memory are impaired. He expresses impulsivity. He does not exhibit a depressed mood. He exhibits abnormal recent memory. He is attentive.    Labs reviewed:  Recent Labs  07/18/14 06/12/15  NA 137 138  K 4.1 4.3  BUN 30* 42*  CREATININE 2.2* 2.6*    Recent Labs  07/18/14 06/12/15  AST 12* 12*  ALT 8* 8*  ALKPHOS 82 76    Recent Labs  07/18/14 06/12/15  WBC 7.2 9.1  HGB 12.4* 10.1*  HCT 37* 31*  PLT  223 204   Lab Results  Component Value Date   TSH 1.09 06/12/2015   Lab Results  Component Value Date   HGBA1C 5.3 06/12/2015   No results found for: CHOL, HDL, LDLCALC, LDLDIRECT, TRIG, CHOLHDL  Significant Diagnostic Results in  last 30 days:  No results found.  Assessment/Plan Anemia in chronic kidney disease 06/12/15 Hgb 10.1  Back pain Back pain, controlled on Tylenol, off Oxycodone since 07/16/14   BPH (benign prostatic hyperplasia) Last urinary retention, Foley inserted 11/04/12 at ED-successfully removed--continued Tamsulosin and Finasteride    CKD (chronic kidney disease) stage 3, GFR 30-59 ml/min Baseline creatinine 2s, chronic, 06/12/15 Na 138, K 4.3, Bun 42, creat 2.62   Constipation Stable, continue Senokot S II bid and MOM q 3days, Doculax suppository prn.    Dementia with behavioral disturbance Functioning well in SNF, stays in his room most of time, ambulates with walker, continue Namenda   Depression Stable, continue Effexor 75mg  and Mirtazapine 7.5mg  qhs. Sleeps well at night, 06/12/15 TSH 1.09. Hgb a1c 5.3     Gait disorder Ambulates with walker    GERD (gastroesophageal reflux disease) Stable, off Omeprazole, continue Zantac.    Hypertension Controlled, continue Amlodipine 5mg      Family/ staff Communication: SNF for care needs  Labs/tests ordered: none

## 2015-07-15 NOTE — Assessment & Plan Note (Signed)
Ambulates with walker.  

## 2015-07-15 NOTE — Assessment & Plan Note (Signed)
06/12/15 Hgb 10.1

## 2015-07-15 NOTE — Assessment & Plan Note (Signed)
Back pain, controlled on Tylenol, off Oxycodone since 07/16/14

## 2015-07-15 NOTE — Assessment & Plan Note (Signed)
Stable, continue Effexor 75mg  and Mirtazapine 7.5mg  qhs. Sleeps well at night, 06/12/15 TSH 1.09. Hgb a1c 5.3

## 2015-07-15 NOTE — Assessment & Plan Note (Signed)
Stable, off Omeprazole, continue Zantac.

## 2015-07-15 NOTE — Assessment & Plan Note (Signed)
Stable, continue Senokot S II bid and MOM q 3days, Doculax suppository prn.

## 2015-07-15 NOTE — Assessment & Plan Note (Signed)
Controlled, continue Amlodipine 5mg  

## 2015-08-12 ENCOUNTER — Non-Acute Institutional Stay (SKILLED_NURSING_FACILITY): Payer: Medicare Other | Admitting: Nurse Practitioner

## 2015-08-12 ENCOUNTER — Encounter: Payer: Self-pay | Admitting: Nurse Practitioner

## 2015-08-12 DIAGNOSIS — D631 Anemia in chronic kidney disease: Secondary | ICD-10-CM | POA: Diagnosis not present

## 2015-08-12 DIAGNOSIS — K5900 Constipation, unspecified: Secondary | ICD-10-CM

## 2015-08-12 DIAGNOSIS — F329 Major depressive disorder, single episode, unspecified: Secondary | ICD-10-CM | POA: Diagnosis not present

## 2015-08-12 DIAGNOSIS — G4700 Insomnia, unspecified: Secondary | ICD-10-CM

## 2015-08-12 DIAGNOSIS — F32A Depression, unspecified: Secondary | ICD-10-CM

## 2015-08-12 DIAGNOSIS — N400 Enlarged prostate without lower urinary tract symptoms: Secondary | ICD-10-CM

## 2015-08-12 DIAGNOSIS — M5440 Lumbago with sciatica, unspecified side: Secondary | ICD-10-CM

## 2015-08-12 DIAGNOSIS — F03918 Unspecified dementia, unspecified severity, with other behavioral disturbance: Secondary | ICD-10-CM

## 2015-08-12 DIAGNOSIS — I10 Essential (primary) hypertension: Secondary | ICD-10-CM | POA: Diagnosis not present

## 2015-08-12 DIAGNOSIS — G20 Parkinson's disease: Secondary | ICD-10-CM | POA: Diagnosis not present

## 2015-08-12 DIAGNOSIS — N183 Chronic kidney disease, stage 3 unspecified: Secondary | ICD-10-CM

## 2015-08-12 DIAGNOSIS — F0391 Unspecified dementia with behavioral disturbance: Secondary | ICD-10-CM | POA: Diagnosis not present

## 2015-08-12 DIAGNOSIS — K219 Gastro-esophageal reflux disease without esophagitis: Secondary | ICD-10-CM | POA: Diagnosis not present

## 2015-08-12 DIAGNOSIS — N189 Chronic kidney disease, unspecified: Secondary | ICD-10-CM

## 2015-08-12 NOTE — Assessment & Plan Note (Signed)
Stable, continue Senokot S II bid and MOM q 3days, Doculax suppository prn.

## 2015-08-12 NOTE — Assessment & Plan Note (Signed)
Not disabling, not on meds.  

## 2015-08-12 NOTE — Assessment & Plan Note (Signed)
Baseline creatinine 2s, chronic, 06/12/15 Na 138, K 4.3, Bun 42, creat 2.62

## 2015-08-12 NOTE — Progress Notes (Signed)
Patient ID: Vincent Potts, male   DOB: Jul 30, 1924, 82 y.o.   MRN: [id number]  Location:  [place] Room Number: N 8 Place of Service:  SNF (31) Provider: Lennie Odor Leylah Tarnow NP  GREEN, Viviann Spare, MD  Patient Care Team: Estill Dooms, MD as PCP - General (Internal Medicine)  Extended Emergency Contact Information Primary Emergency Contact: Phillis Haggis [address] [place] Phone: [phone] Relation: Nephew  Code Status:  DNR Goals of care: Advanced Directive information Advanced Directives 08/12/2015  Does patient have an advance directive? Yes  Type of Paramedic of [place];Out of facility DNR (pink MOST or yellow form)  Does patient want to make changes to advanced directive? No - Patient declined  Copy of advanced directive(s) in chart? Yes     Chief Complaint  Patient presents with  . Medical Management of Chronic Issues    HPI: Pt is a 82 y.o. male seen today for medical management of chronic diseases.  Hx of HTN controlled while on Amlodipine 5mg , GERD is managed on Zantac, off Omeprazole, dementia has been stabilized on Namenda, resides in SNF for care needs. Hx if chronic kidney disease with baseline creat 2s. He is forgetful, but able to voice his needs while on Namenda Hx of BPH with recurrent urinary retention is the past, last episode was about 2 years ago, taking Tamsulosin and Finasteride.    Past Medical History  Diagnosis Date  . Thyroid disease   . Anemia   . Vitamin D deficiency disease   . Hyponatremia   . Anxiety   . Parkinsonism ([place])   . Hearing loss   . Hypertension   . PVC (premature ventricular contraction)   . CVA (cerebral infarction)   . GERD (gastroesophageal reflux disease)   . Diverticulosis   . Sciatica   . Back pain   . Gait disorder   . Cataract   . Edema   . Keratosis   . Sciatica   . Cystic disease of liver   . Paresthesia   . Inguinal hernia   .  Hearing loss   . Stroke [facility]) 2010    no deficits  . Chronic kidney disease 2013    rhabdo  . H/O hiatal hernia 2008    repaired  . Depression 06/06/2012  . Dementia with behavioral disturbance 06/06/2012  . CKD (chronic kidney disease) stage 3, GFR 30-59 ml/min 08/29/2012    Creatinine 1.88 07/05/13(baseline 1.5-2.0) 02/11/14 creatinine 2.00 05/16/14 creatinine 2.31 07/18/14 creatinine 2.22      Past Surgical History  Procedure Laterality Date  . Prostate surgery      cancer  . Hernia repair    . Appendectomy    . Esophagogastroduodenoscopy  10/25/2011    Procedure: ESOPHAGOGASTRODUODENOSCOPY (EGD);  Surgeon: Winfield Cunas., MD;  Location: [facility] ENDOSCOPY;  Service: Endoscopy;  Laterality: N/A;    Allergies  Allergen Reactions  . Olanzapine Other (See Comments)    unknown  . Rivastigmine Nausea Only      Medication List       This list is accurate as of: 08/12/15  1:37 PM.  Always use your most recent med list.               acetaminophen 500 MG tablet  Commonly known as:  TYLENOL  Take 500 mg by mouth at bedtime.     acetaminophen 325 MG tablet  Commonly  known as:  TYLENOL  Take 650 mg by mouth every 4 (four) hours as needed.     amLODipine 5 MG tablet  Commonly known as:  NORVASC  Take 1 tablet (5 mg total) by mouth every morning.     bisacodyl 10 MG suppository  Commonly known as:  DULCOLAX  Place 10 mg rectally daily as needed for moderate constipation.     cholecalciferol 1000 units tablet  Commonly known as:  VITAMIN D  Take 1,000 Units by mouth every morning.     feeding supplement Liqd  Take 237 mLs by mouth. Take one can daily as needed     finasteride 5 MG tablet  Commonly known as:  PROSCAR  Take 5 mg by mouth daily.     Fish Oil 1000 MG Caps  Take 2 capsules by mouth daily.     fluticasone 50 MCG/ACT nasal spray  Commonly known as:  FLONASE  Place 1 spray into both nostrils at bedtime.     loratadine 10 MG tablet  Commonly known as:   CLARITIN  Take 10 mg by mouth daily as needed for allergies or rhinitis.     loteprednol 0.5 % ophthalmic suspension  Commonly known as:  LOTEMAX  Place 1 drop into the left eye daily.     magnesium hydroxide 400 MG/5ML suspension  Commonly known as:  MILK OF MAGNESIA  Take 30 mLs by mouth every 3 (three) days. Please take 30 ml by mouth daily prn for constipation.     Melatonin 3 MG Tabs  Take 3 mg by mouth at bedtime.     mirtazapine 7.5 MG tablet  Commonly known as:  REMERON  Take 7.5 mg by mouth at bedtime.     NAMENDA XR 28 MG Cp24 24 hr capsule  Generic drug:  memantine  Take one daily for memory     potassium chloride 10 MEQ tablet  Commonly known as:  K-DUR  Take 10 mEq by mouth daily.     PREVIDENT 5000 PLUS 1.1 % Crea dental cream  Generic drug:  sodium fluoride  Place 1 application onto teeth every evening.     ranitidine 75 MG tablet  Commonly known as:  ZANTAC  Take 75 mg by mouth daily as needed for heartburn.     senna 8.6 MG tablet  Commonly known as:  SENOKOT  Take 2 tablets by mouth 2 (two) times daily.     sodium chloride 2 % ophthalmic solution  Commonly known as:  MURO 128  Place 1 drop into the left eye at bedtime.     SYSTANE 0.4-0.3 % Soln  Generic drug:  Polyethyl Glycol-Propyl Glycol  Place 1 drop into both eyes 3 (three) times daily.     tamsulosin 0.4 MG Caps capsule  Commonly known as:  FLOMAX  Take 0.4 mg by mouth every morning.     venlafaxine 75 MG tablet  Commonly known as:  EFFEXOR  Take 75 mg by mouth daily.        Review of Systems  Constitutional: Negative for fever, chills and diaphoresis.  HENT: Positive for congestion, hearing loss and sinus pressure. Negative for ear discharge, ear pain, nosebleeds, postnasal drip, rhinorrhea, sore throat and tinnitus.        Nasal congestion at night  Eyes: Negative for photophobia, pain, discharge and redness.  Respiratory: Negative for cough, shortness of breath, wheezing and  stridor.   Cardiovascular: Negative for chest pain, palpitations and leg swelling.  Gastrointestinal:  Negative for nausea, vomiting, abdominal pain, diarrhea, constipation and blood in stool.  Endocrine: Negative for polydipsia.  Genitourinary: Positive for frequency. Negative for dysuria, urgency, hematuria and flank pain.  Musculoskeletal: Positive for back pain and gait problem. Negative for myalgias and neck pain.  Skin: Negative for rash.  Allergic/Immunologic: Negative for environmental allergies.  Neurological: Negative for dizziness, tremors, seizures, weakness and headaches.  Hematological: Does not bruise/bleed easily.  Psychiatric/Behavioral: Negative for suicidal ideas and hallucinations. The patient is not nervous/anxious.     Immunization History  Administered Date(s) Administered  . Influenza Whole 12/20/2012  . Influenza-Unspecified 12/06/2013, 12/06/2014  . Tdap 10/24/2011   Pertinent  Health Maintenance Due  Topic Date Due  . PNA vac Low Risk Adult (1 of 2 - PCV13) 04/06/1991  . INFLUENZA VACCINE  09/30/2015   Fall Risk  09/12/2014 05/23/2014  Falls in the past year? Yes No  Number falls in past yr: 1 -  Injury with Fall? No -  Risk for fall due to : - History of fall(s);Impaired balance/gait   Functional Status Survey:    Filed Vitals:   08/12/15 1215  Pulse: 74  Temp: 98.2 F (36.8 C)  Resp: 20  Weight: 154 lb (69.854 kg)   Body mass index is 30.08 kg/(m^2). Physical Exam  Constitutional: He appears well-developed and well-nourished.  HENT:  Head: Normocephalic and atraumatic. Not macrocephalic.  Nose: No mucosal edema or rhinorrhea. Right sinus exhibits no maxillary sinus tenderness and no frontal sinus tenderness. Left sinus exhibits no maxillary sinus tenderness and no frontal sinus tenderness.  Mouth/Throat: Uvula is midline. Posterior oropharyngeal erythema present. No oropharyngeal exudate or posterior oropharyngeal edema.  Eyes: EOM are normal.  Pupils are equal, round, and reactive to light.  Neck: Normal range of motion. Neck supple. No JVD present. No thyromegaly present.  Cardiovascular: Normal rate and regular rhythm.   Murmur heard.  Systolic murmur is present with a grade of 2/6  Pulmonary/Chest: Effort normal and breath sounds normal. He has no wheezes. He has no rales.  Abdominal: Soft. Bowel sounds are normal. There is no tenderness.  Musculoskeletal: Normal range of motion. He exhibits tenderness. He exhibits no edema.  Scoliokyphosis, walking with walker with SBA. Chronic back pain. 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  Lymphadenopathy:    He has no cervical adenopathy.  Neurological: He is alert. He displays normal reflexes. No cranial nerve deficit. He exhibits normal muscle tone. Coordination normal.  Skin: Skin is warm and dry. No rash noted. No erythema.  AK top of head  Psychiatric: His mood appears not anxious. His affect is not angry, not blunt and not labile. His speech is not delayed and not slurred. He is not agitated, not aggressive, not slowed, not withdrawn, not actively hallucinating and not combative. Thought content is not paranoid and not delusional. Cognition and memory are impaired. He expresses impulsivity. He does not exhibit a depressed mood. He exhibits abnormal recent memory. He is attentive.    Labs reviewed:  Recent Labs  06/12/15  NA 138  K 4.3  BUN 42*  CREATININE 2.6*    Recent Labs  06/12/15  AST 12*  ALT 8*  ALKPHOS 76    Recent Labs  06/12/15  WBC 9.1  HGB 10.1*  HCT 31*  PLT 204   Lab Results  Component Value Date   TSH 1.09 06/12/2015   Lab Results  Component Value Date   HGBA1C 5.3 06/12/2015   No results found  for: CHOL, HDL, LDLCALC, LDLDIRECT, TRIG, CHOLHDL  Significant Diagnostic Results in last 30 days:  No results found.  Assessment/Plan Hypertension Controlled, continue Amlodipine 5mg   GERD (gastroesophageal reflux disease) Stable,  off Omeprazole, continue Zantac.    Constipation Stable, continue Senokot S II bid and MOM q 3days, Doculax suppository prn.   Dementia with behavioral disturbance Functioning well in SNF, stays in his room most of time, ambulates with walker, continue Namenda  Parkinsonism Not disabling, not on meds  CKD (chronic kidney disease) stage 3, GFR 30-59 ml/min Baseline creatinine 2s, chronic, 06/12/15 Na 138, K 4.3, Bun 42, creat 2.62  BPH (benign prostatic hyperplasia) Last urinary retention, Foley inserted 11/04/12 at ED-successfully removed--continued Tamsulosin and Finasteride  Anemia in chronic kidney disease 06/12/15 Hgb 10.1  Back pain Back pain, controlled on Tylenol, off Oxycodone since 07/16/14  Depression Stable, continue Effexor 75mg  and Mirtazapine 7.5mg  qhs. Sleeps well at night, 06/12/15 TSH 1.09. Hgb a1c 5.3  Insomnia Stable, continue Mirtazapine 7.5mg , Melatonin 3mg     Family/ staff Communication: SNF for care needs  Labs/tests ordered: none

## 2015-08-12 NOTE — Assessment & Plan Note (Signed)
Stable, continue Effexor 75mg  and Mirtazapine 7.5mg  qhs. Sleeps well at night, 06/12/15 TSH 1.09. Hgb a1c 5.3

## 2015-08-12 NOTE — Assessment & Plan Note (Signed)
Functioning well in SNF, stays in his room most of time, ambulates with walker, continue Namenda 

## 2015-08-12 NOTE — Assessment & Plan Note (Signed)
Stable, continue Mirtazapine 7.5mg , Melatonin 3mg 

## 2015-08-12 NOTE — Assessment & Plan Note (Signed)
06/12/15 Hgb 10.1

## 2015-08-12 NOTE — Assessment & Plan Note (Signed)
Last urinary retention, Foley inserted 11/04/12 at ED-successfully removed--continued Tamsulosin and Finasteride 

## 2015-08-12 NOTE — Assessment & Plan Note (Signed)
Stable, off Omeprazole, continue Zantac.

## 2015-08-12 NOTE — Assessment & Plan Note (Signed)
Back pain, controlled on Tylenol, off Oxycodone since 07/16/14

## 2015-08-12 NOTE — Assessment & Plan Note (Signed)
Controlled, continue Amlodipine 5mg  

## 2015-08-28 ENCOUNTER — Non-Acute Institutional Stay (SKILLED_NURSING_FACILITY): Payer: Medicare Other | Admitting: Internal Medicine

## 2015-08-28 ENCOUNTER — Encounter: Payer: Self-pay | Admitting: Internal Medicine

## 2015-08-28 DIAGNOSIS — G4700 Insomnia, unspecified: Secondary | ICD-10-CM | POA: Diagnosis not present

## 2015-08-28 DIAGNOSIS — N183 Chronic kidney disease, stage 3 unspecified: Secondary | ICD-10-CM

## 2015-08-28 DIAGNOSIS — M5440 Lumbago with sciatica, unspecified side: Secondary | ICD-10-CM | POA: Diagnosis not present

## 2015-08-28 DIAGNOSIS — N189 Chronic kidney disease, unspecified: Secondary | ICD-10-CM | POA: Diagnosis not present

## 2015-08-28 DIAGNOSIS — F0391 Unspecified dementia with behavioral disturbance: Secondary | ICD-10-CM | POA: Diagnosis not present

## 2015-08-28 DIAGNOSIS — F03918 Unspecified dementia, unspecified severity, with other behavioral disturbance: Secondary | ICD-10-CM

## 2015-08-28 DIAGNOSIS — D631 Anemia in chronic kidney disease: Secondary | ICD-10-CM | POA: Diagnosis not present

## 2015-08-28 DIAGNOSIS — I10 Essential (primary) hypertension: Secondary | ICD-10-CM

## 2015-08-28 NOTE — Progress Notes (Signed)
Patient ID: Vincent Potts, male   DOB: Nov 28, 1924, 82 y.o.   MRN: [id number]   Acute visit  Location:   [place] Room Number: N8 Place of Service:  SNF (31) Provider:  Estill Dooms, MD  Patient Care Team: Estill Dooms, MD as PCP - General (Internal Medicine)  Extended Emergency Contact Information Primary Emergency Contact: Phillis Haggis [address] [place] Phone: [phone] Relation: Nephew   Goals of care: Advanced Directive information Advanced Directives 08/28/2015  Does patient have an advance directive? Yes  Type of Paramedic of [place];Out of facility DNR (pink MOST or yellow form)  Does patient want to make changes to advanced directive? -  Copy of advanced directive(s) in chart? Yes  Pre-existing out of facility DNR order (yellow form or pink MOST form) Yellow form placed in chart (order not valid for inpatient use);Pink MOST form placed in chart (order not valid for inpatient use)     Chief Complaint  Patient presents with  . Acute Visit    bruises and redness at right middle and lower back, right hip     HPI:  Pt is a 82 y.o. male seen today for an acute visit for Report of bruising and redness of the right middle and lower back and the right hip. Patient does not recall falling. He says he is not in any pain at this time.  Blood pressure is under control.  CKD 3 is stable.  Dementia seems unchanged to me compared to last fall 2016.  Patient remains unsteady on his feet.  Insomnia is better.   Past Medical History  Diagnosis Date  . Thyroid disease   . Anemia   . Vitamin D deficiency disease   . Hyponatremia   . Anxiety   . Parkinsonism ([place])   . Hearing loss   . Hypertension   . PVC (premature ventricular contraction)   . CVA (cerebral infarction)   . GERD (gastroesophageal reflux disease)   . Diverticulosis   . Sciatica   . Back pain   . Gait disorder   . Cataract     . Edema   . Keratosis   . Sciatica   . Cystic disease of liver   . Paresthesia   . Inguinal hernia   . Hearing loss   . Stroke [facility]) 2010    no deficits  . Chronic kidney disease 2013    rhabdo  . H/O hiatal hernia 2008    repaired  . Depression 06/06/2012  . Dementia with behavioral disturbance 06/06/2012  . CKD (chronic kidney disease) stage 3, GFR 30-59 ml/min 08/29/2012    Creatinine 1.88 07/05/13(baseline 1.5-2.0) 02/11/14 creatinine 2.00 05/16/14 creatinine 2.31 07/18/14 creatinine 2.22      Past Surgical History  Procedure Laterality Date  . Prostate surgery      cancer  . Hernia repair    . Appendectomy    . Esophagogastroduodenoscopy  10/25/2011    Procedure: ESOPHAGOGASTRODUODENOSCOPY (EGD);  Surgeon: Winfield Cunas., MD;  Location: [facility] ENDOSCOPY;  Service: Endoscopy;  Laterality: N/A;    Allergies  Allergen Reactions  . Olanzapine Other (See Comments)    unknown  . Rivastigmine Nausea Only      Medication List       This list is accurate as of: 08/28/15  9:53 AM.  Always use your most recent med list.  acetaminophen 500 MG tablet  Commonly known as:  TYLENOL  Take 500 mg by mouth at bedtime.     acetaminophen 325 MG tablet  Commonly known as:  TYLENOL  Take 650 mg by mouth every 4 (four) hours as needed.     amLODipine 5 MG tablet  Commonly known as:  NORVASC  Take 1 tablet (5 mg total) by mouth every morning.     bisacodyl 10 MG suppository  Commonly known as:  DULCOLAX  Place 10 mg rectally daily as needed for moderate constipation.     cholecalciferol 1000 units tablet  Commonly known as:  VITAMIN D  Take 1,000 Units by mouth every morning.     feeding supplement Liqd  Take 237 mLs by mouth. Take one can daily as needed     finasteride 5 MG tablet  Commonly known as:  PROSCAR  Take 5 mg by mouth daily.     Fish Oil 1000 MG Caps  Take 2 capsules by mouth daily.     fluticasone 50 MCG/ACT nasal spray  Commonly known as:   FLONASE  Place 1 spray into both nostrils at bedtime.     loratadine 10 MG tablet  Commonly known as:  CLARITIN  Take 10 mg by mouth daily as needed for allergies or rhinitis.     loteprednol 0.5 % ophthalmic suspension  Commonly known as:  LOTEMAX  Place 1 drop into the left eye daily.     magnesium hydroxide 400 MG/5ML suspension  Commonly known as:  MILK OF MAGNESIA  Take 30 mLs by mouth. Please take 30 ml by mouth every other day  prn for constipation.     Melatonin 3 MG Tabs  Take 3 mg by mouth at bedtime.     mirtazapine 7.5 MG tablet  Commonly known as:  REMERON  Take 7.5 mg by mouth at bedtime.     NAMENDA XR 28 MG Cp24 24 hr capsule  Generic drug:  memantine  Take one daily for memory     potassium chloride 10 MEQ tablet  Commonly known as:  K-DUR  Take 10 mEq by mouth daily.     PREVIDENT 5000 PLUS 1.1 % Crea dental cream  Generic drug:  sodium fluoride  Place 1 application onto teeth every evening.     ranitidine 75 MG tablet  Commonly known as:  ZANTAC  Take 75 mg by mouth daily as needed for heartburn.     senna 8.6 MG tablet  Commonly known as:  SENOKOT  Take 2 tablets by mouth 2 (two) times daily.     sodium chloride 2 % ophthalmic solution  Commonly known as:  MURO 128  Place 1 drop into the left eye. Four times a day     sodium chloride 2 % ophthalmic solution  Commonly known as:  MURO 128  Place 1 drop into the left eye at bedtime.     SYSTANE 0.4-0.3 % Soln  Generic drug:  Polyethyl Glycol-Propyl Glycol  Place 1 drop into both eyes 3 (three) times daily.     tamsulosin 0.4 MG Caps capsule  Commonly known as:  FLOMAX  Take 0.4 mg by mouth every morning.     venlafaxine 75 MG tablet  Commonly known as:  EFFEXOR  Take 75 mg by mouth daily.        Review of Systems  Constitutional: Negative for fever, chills and diaphoresis.  HENT: Positive for congestion, hearing loss and sinus pressure. Negative for ear  discharge, ear pain,  nosebleeds, postnasal drip, rhinorrhea, sore throat and tinnitus.        Nasal congestion at night  Eyes: Negative for photophobia, pain, discharge and redness.  Respiratory: Negative for cough, shortness of breath, wheezing and stridor.   Cardiovascular: Negative for chest pain, palpitations and leg swelling.  Gastrointestinal: Negative for nausea, vomiting, abdominal pain, diarrhea, constipation and blood in stool.  Endocrine: Negative for polydipsia.  Genitourinary: Positive for frequency. Negative for dysuria, urgency, hematuria and flank pain.  Musculoskeletal: Positive for back pain and gait problem. Negative for myalgias and neck pain.  Skin: Negative for rash.  Allergic/Immunologic: Negative for environmental allergies.  Neurological: Negative for dizziness, tremors, seizures, weakness and headaches.  Hematological: Does not bruise/bleed easily.  Psychiatric/Behavioral: Negative for suicidal ideas and hallucinations. The patient is not nervous/anxious.     Immunization History  Administered Date(s) Administered  . DTaP 10/31/2011  . Influenza Whole 12/20/2012  . Influenza-Unspecified 12/06/2013, 12/06/2014  . Pneumococcal-Unspecified 10/31/2011  . Tdap 10/24/2011   Pertinent  Health Maintenance Due  Topic Date Due  . PNA vac Low Risk Adult (1 of 2 - PCV13) 04/06/1991  . INFLUENZA VACCINE  09/30/2015   Fall Risk  09/12/2014 05/23/2014  Falls in the past year? Yes No  Number falls in past yr: 1 -  Injury with Fall? No -  Risk for fall due to : - History of fall(s);Impaired balance/gait   Functional Status Survey:    Filed Vitals:   08/28/15 0930  BP: 137/92  Pulse: 83  Temp: 98 F (36.7 C)  Resp: 18  Height: 5' (1.524 m)  Weight: 154 lb (69.854 kg)  SpO2: 96%   Body mass index is 30.08 kg/(m^2). Physical Exam  Constitutional: He appears well-developed and well-nourished.  HENT:  Head: Normocephalic and atraumatic. Not macrocephalic.  Nose: No mucosal edema or  rhinorrhea. Right sinus exhibits no maxillary sinus tenderness and no frontal sinus tenderness. Left sinus exhibits no maxillary sinus tenderness and no frontal sinus tenderness.  Mouth/Throat: Uvula is midline. Posterior oropharyngeal erythema present. No oropharyngeal exudate or posterior oropharyngeal edema.  Eyes: EOM are normal. Pupils are equal, round, and reactive to light.  Neck: Normal range of motion. Neck supple. No JVD present. No thyromegaly present.  Cardiovascular: Normal rate and regular rhythm.   Murmur heard.  Systolic murmur is present with a grade of 2/6  Pulmonary/Chest: Effort normal and breath sounds normal. He has no wheezes. He has no rales.  Abdominal: Soft. Bowel sounds are normal. There is no tenderness.  Musculoskeletal: Normal range of motion. He exhibits tenderness. He exhibits no edema.  Scoliokyphosis, walking with walker with SBA. Chronic back pain. 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  Lymphadenopathy:    He has no cervical adenopathy.  Neurological: He is alert. He displays normal reflexes. No cranial nerve deficit. He exhibits normal muscle tone. Coordination normal.  Skin: Skin is warm and dry. No rash noted. No erythema.  AK top of head  Psychiatric: His mood appears not anxious. His affect is not angry, not blunt and not labile. His speech is not delayed and not slurred. He is not agitated, not aggressive, not slowed, not withdrawn, not actively hallucinating and not combative. Thought content is not paranoid and not delusional. Cognition and memory are impaired. He expresses impulsivity. He does not exhibit a depressed mood. He exhibits abnormal recent memory. He is attentive.    Labs reviewed:  Recent Labs  06/12/15  NA 138  K 4.3  BUN 42*  CREATININE 2.6*    Recent Labs  06/12/15  AST 12*  ALT 8*  ALKPHOS 76    Recent Labs  06/12/15  WBC 9.1  HGB 10.1*  HCT 31*  PLT 204   Lab Results  Component Value Date   TSH 1.09  06/12/2015   Lab Results  Component Value Date   HGBA1C 5.3 06/12/2015    Assessment/Plan 1. Midline low back pain with sciatica, sciatica laterality unspecified Unchanged  2. CKD (chronic kidney disease) stage 3, GFR 30-59 ml/min Stable  3. Dementia with behavioral disturbance Stable  4. Essential hypertension Controlled  5. Anemia in chronic kidney disease Stable  6. Insomnia Improved

## 2015-10-07 ENCOUNTER — Encounter: Payer: Self-pay | Admitting: Nurse Practitioner

## 2015-10-07 ENCOUNTER — Non-Acute Institutional Stay (SKILLED_NURSING_FACILITY): Payer: Medicare Other | Admitting: Nurse Practitioner

## 2015-10-07 DIAGNOSIS — F0391 Unspecified dementia with behavioral disturbance: Secondary | ICD-10-CM

## 2015-10-07 DIAGNOSIS — N400 Enlarged prostate without lower urinary tract symptoms: Secondary | ICD-10-CM | POA: Diagnosis not present

## 2015-10-07 DIAGNOSIS — N183 Chronic kidney disease, stage 3 unspecified: Secondary | ICD-10-CM

## 2015-10-07 DIAGNOSIS — G218 Other secondary parkinsonism: Secondary | ICD-10-CM

## 2015-10-07 DIAGNOSIS — D631 Anemia in chronic kidney disease: Secondary | ICD-10-CM

## 2015-10-07 DIAGNOSIS — K2100 Gastro-esophageal reflux disease with esophagitis, without bleeding: Secondary | ICD-10-CM

## 2015-10-07 DIAGNOSIS — M5440 Lumbago with sciatica, unspecified side: Secondary | ICD-10-CM

## 2015-10-07 DIAGNOSIS — I10 Essential (primary) hypertension: Secondary | ICD-10-CM

## 2015-10-07 DIAGNOSIS — F32A Depression, unspecified: Secondary | ICD-10-CM

## 2015-10-07 DIAGNOSIS — F03918 Unspecified dementia, unspecified severity, with other behavioral disturbance: Secondary | ICD-10-CM

## 2015-10-07 DIAGNOSIS — F329 Major depressive disorder, single episode, unspecified: Secondary | ICD-10-CM | POA: Diagnosis not present

## 2015-10-07 DIAGNOSIS — N189 Chronic kidney disease, unspecified: Secondary | ICD-10-CM

## 2015-10-07 DIAGNOSIS — G4700 Insomnia, unspecified: Secondary | ICD-10-CM | POA: Diagnosis not present

## 2015-10-07 DIAGNOSIS — K5900 Constipation, unspecified: Secondary | ICD-10-CM

## 2015-10-07 NOTE — Assessment & Plan Note (Signed)
Stable, continue Mirtazapine 7.5mg , Melatonin 3mg 

## 2015-10-07 NOTE — Assessment & Plan Note (Signed)
Not disabling, not on meds.  

## 2015-10-07 NOTE — Assessment & Plan Note (Signed)
Back pain, controlled on Tylenol, off Oxycodone since 07/16/14, still able to ambulate with walker on unit.

## 2015-10-07 NOTE — Assessment & Plan Note (Signed)
Baseline creatinine 2s, chronic, 06/12/15 Na 138, K 4.3, Bun 42, creat 2.62

## 2015-10-07 NOTE — Assessment & Plan Note (Signed)
Last urinary retention, Foley inserted 11/04/12 at ED-successfully removed--continued Tamsulosin and Finasteride 

## 2015-10-07 NOTE — Progress Notes (Signed)
Location:   [facility] Room Number: N 4 Place of Service:  SNF (31) Provider:  Marlana Latus  NP    Patient Care Team: Estill Dooms, MD as PCP - General (Internal Medicine) Kripa Foskey Otho Darner, NP as Nurse Practitioner (Internal Medicine)  Extended Emergency Contact Information Primary Emergency Contact: Phillis Haggis [address] [place] Phone: [phone] Relation: Nephew  Code Status: DNR Goals of care: Advanced Directive information Advanced Directives 10/07/2015  Does patient have an advance directive? Yes  Type of Paramedic of [place];Out of facility DNR (pink MOST or yellow form)  Does patient want to make changes to advanced directive? No - Patient declined  Copy of advanced directive(s) in chart? Yes  Pre-existing out of facility DNR order (yellow form or pink MOST form) -     Chief Complaint  Patient presents with  . Medical Management of Chronic Issues    HPI:  Pt is a 82 y.o. male seen today for medical management of chronic diseases.    Hx of HTN controlled while on Amlodipine 5mg , GERD is managed on Zantac, off Omeprazole, dementia has been stabilized on Namenda, resides in SNF for care needs. Hx if chronic kidney disease with baseline creat 2s. He is forgetful, but able to voice his needs while on Namenda Hx of BPH with recurrent urinary retention is the past, taking Tamsulosin and Finasteride.    Past Medical History:  Diagnosis Date  . Anemia   . Anxiety   . Back pain   . Cataract   . Chronic kidney disease 2013   rhabdo  . CKD (chronic kidney disease) stage 3, GFR 30-59 ml/min 08/29/2012   Creatinine 1.88 07/05/13(baseline 1.5-2.0) 02/11/14 creatinine 2.00 05/16/14 creatinine 2.31 07/18/14 creatinine 2.22     . CVA (cerebral infarction)   . Cystic disease of liver   . Dementia with behavioral disturbance 06/06/2012  . Depression 06/06/2012  . Diverticulosis   . Edema   . Gait  disorder   . GERD (gastroesophageal reflux disease)   . H/O hiatal hernia 2008   repaired  . Hearing loss   . Hearing loss   . Hypertension   . Hyponatremia   . Inguinal hernia   . Keratosis   . Paresthesia   . Parkinsonism ([place])   . PVC (premature ventricular contraction)   . Sciatica   . Sciatica   . Stroke [facility]) 2010   no deficits  . Thyroid disease   . Vitamin D deficiency disease    Past Surgical History:  Procedure Laterality Date  . APPENDECTOMY    . ESOPHAGOGASTRODUODENOSCOPY  10/25/2011   Procedure: ESOPHAGOGASTRODUODENOSCOPY (EGD);  Surgeon: Winfield Cunas., MD;  Location: [facility] ENDOSCOPY;  Service: Endoscopy;  Laterality: N/A;  . HERNIA REPAIR    . PROSTATE SURGERY     cancer    Allergies  Allergen Reactions  . Olanzapine Other (See Comments)    unknown  . Rivastigmine Nausea Only      Medication List       Accurate as of 10/07/15  6:31 PM. Always use your most recent med list.          acetaminophen 500 MG tablet Commonly known as:  TYLENOL Take 500 mg by mouth at bedtime.   acetaminophen 325 MG tablet Commonly known as:  TYLENOL Take 650 mg by mouth every 4 (four) hours as needed.   amLODipine 5  MG tablet Commonly known as:  NORVASC Take 1 tablet (5 mg total) by mouth every morning.   bisacodyl 10 MG suppository Commonly known as:  DULCOLAX Place 10 mg rectally daily as needed for moderate constipation.   feeding supplement Liqd Take 237 mLs by mouth. Take one can daily as needed   finasteride 5 MG tablet Commonly known as:  PROSCAR Take 5 mg by mouth daily.   Fish Oil 1000 MG Caps Take 2 capsules by mouth daily.   loratadine 10 MG tablet Commonly known as:  CLARITIN Take 10 mg by mouth daily as needed for allergies or rhinitis.   loteprednol 0.5 % ophthalmic suspension Commonly known as:  LOTEMAX Place 1 drop into the left eye daily.   magnesium hydroxide 400 MG/5ML suspension Commonly known as:  MILK OF MAGNESIA Take 30  mLs by mouth. Please take 30 ml by mouth every other day  prn for constipation.   Melatonin 3 MG Tabs Take 3 mg by mouth at bedtime.   mirtazapine 7.5 MG tablet Commonly known as:  REMERON Take 7.5 mg by mouth at bedtime.   NAMENDA XR 28 MG Cp24 24 hr capsule Generic drug:  memantine Take one daily for memory   potassium chloride 10 MEQ tablet Commonly known as:  K-DUR Take 10 mEq by mouth daily.   PREVIDENT 5000 PLUS 1.1 % Crea dental cream Generic drug:  sodium fluoride Place 1 application onto teeth every evening.   ranitidine 75 MG tablet Commonly known as:  ZANTAC Take 75 mg by mouth daily as needed for heartburn.   senna 8.6 MG tablet Commonly known as:  SENOKOT Take 2 tablets by mouth 2 (two) times daily.   sodium chloride 2 % ophthalmic solution Commonly known as:  MURO 128 Place 1 drop into the left eye. Three times a day   sodium chloride 2 % ophthalmic solution Commonly known as:  MURO 128 Place 1 drop into the left eye at bedtime.   SYSTANE 0.4-0.3 % Soln Generic drug:  Polyethyl Glycol-Propyl Glycol Place 1 drop into both eyes 3 (three) times daily.   tamsulosin 0.4 MG Caps capsule Commonly known as:  FLOMAX Take 0.4 mg by mouth every morning.   venlafaxine 75 MG tablet Commonly known as:  EFFEXOR Take 75 mg by mouth daily.   Vitamin D3 10000 units Tabs Take 1 tablet by mouth daily.       Review of Systems  Constitutional: Negative for chills, diaphoresis and fever.  HENT: Positive for congestion, hearing loss and sinus pressure. Negative for ear discharge, ear pain, nosebleeds, postnasal drip, rhinorrhea, sore throat and tinnitus.        Nasal congestion at night  Eyes: Negative for photophobia, pain, discharge and redness.  Respiratory: Negative for cough, shortness of breath, wheezing and stridor.   Cardiovascular: Negative for chest pain, palpitations and leg swelling.  Gastrointestinal: Negative for abdominal pain, blood in stool,  constipation, diarrhea, nausea and vomiting.  Endocrine: Negative for polydipsia.  Genitourinary: Positive for frequency. Negative for dysuria, flank pain, hematuria and urgency.  Musculoskeletal: Positive for back pain and gait problem. Negative for myalgias and neck pain.  Skin: Negative for rash.  Allergic/Immunologic: Negative for environmental allergies.  Neurological: Negative for dizziness, tremors, seizures, weakness and headaches.  Hematological: Does not bruise/bleed easily.  Psychiatric/Behavioral: Negative for hallucinations and suicidal ideas. The patient is not nervous/anxious.     Immunization History  Administered Date(s) Administered  . DTaP 10/31/2011  . Influenza Whole 12/20/2012  .  Influenza-Unspecified 12/06/2013, 12/06/2014  . Pneumococcal-Unspecified 10/31/2011  . Tdap 10/24/2011   Pertinent  Health Maintenance Due  Topic Date Due  . PNA vac Low Risk Adult (2 of 2 - PCV13) 10/30/2012  . INFLUENZA VACCINE  09/30/2015   Fall Risk  09/12/2014 05/23/2014  Falls in the past year? Yes No  Number falls in past yr: 1 -  Injury with Fall? No -  Risk for fall due to : - History of fall(s);Impaired balance/gait   Functional Status Survey:    Vitals:   10/07/15 1448  BP: 130/74  Pulse: 72  Resp: 18  Temp: 97.4 F (36.3 C)  Weight: 153 lb (69.4 kg)  Height: 5' (1.524 m)   Body mass index is 29.88 kg/m. Physical Exam  Constitutional: He appears well-developed and well-nourished.  HENT:  Head: Normocephalic and atraumatic. Not macrocephalic.  Nose: No mucosal edema or rhinorrhea. Right sinus exhibits no maxillary sinus tenderness and no frontal sinus tenderness. Left sinus exhibits no maxillary sinus tenderness and no frontal sinus tenderness.  Mouth/Throat: Uvula is midline. Posterior oropharyngeal erythema present. No oropharyngeal exudate or posterior oropharyngeal edema.  Eyes: EOM are normal. Pupils are equal, round, and reactive to light.  Neck: Normal  range of motion. Neck supple. No JVD present. No thyromegaly present.  Cardiovascular: Normal rate and regular rhythm.   Murmur heard.  Systolic murmur is present with a grade of 2/6  Pulmonary/Chest: Effort normal and breath sounds normal. He has no wheezes. He has no rales.  Abdominal: Soft. Bowel sounds are normal. There is no tenderness.  Musculoskeletal: Normal range of motion. He exhibits tenderness. He exhibits no edema.  Scoliokyphosis, walking with walker with SBA. Chronic back pain. 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  Lymphadenopathy:    He has no cervical adenopathy.  Neurological: He is alert. He displays normal reflexes. No cranial nerve deficit. He exhibits normal muscle tone. Coordination normal.  Skin: Skin is warm and dry. No rash noted. No erythema.  AK top of head  Psychiatric: His mood appears not anxious. His affect is not angry, not blunt and not labile. His speech is not delayed and not slurred. He is not agitated, not aggressive, not slowed, not withdrawn, not actively hallucinating and not combative. Thought content is not paranoid and not delusional. Cognition and memory are impaired. He expresses impulsivity. He does not exhibit a depressed mood. He exhibits abnormal recent memory. He is attentive.    Labs reviewed:  Recent Labs  06/12/15  NA 138  K 4.3  BUN 42*  CREATININE 2.6*    Recent Labs  06/12/15  AST 12*  ALT 8*  ALKPHOS 76    Recent Labs  06/12/15  WBC 9.1  HGB 10.1*  HCT 31*  PLT 204   Lab Results  Component Value Date   TSH 1.09 06/12/2015   Lab Results  Component Value Date   HGBA1C 5.3 06/12/2015   No results found for: CHOL, HDL, LDLCALC, LDLDIRECT, TRIG, CHOLHDL  Significant Diagnostic Results in last 30 days:  No results found.  Assessment/Plan There are no diagnoses linked to this encounter.Hypertension Controlled, continue Amlodipine 5mg    GERD (gastroesophageal reflux disease) Stable, off  Omeprazole, continue Zantac.   Constipation Stable, continue Senokot S II bid and MOM q 3days, Doculax suppository prn.    Dementia with behavioral disturbance Functioning well in SNF, stays in his room most of time, ambulates with walker, continue Namenda   Parkinsonism Not disabling, not on  meds   CKD (chronic kidney disease) stage 3, GFR 30-59 ml/min Baseline creatinine 2s, chronic, 06/12/15 Na 138, K 4.3, Bun 42, creat 2.62  BPH (benign prostatic hyperplasia) Last urinary retention, Foley inserted 11/04/12 at ED-successfully removed--continued Tamsulosin and Finasteride   Anemia in chronic kidney disease 06/12/15 Hgb 10.1   Back pain Back pain, controlled on Tylenol, off Oxycodone since 07/16/14, still able to ambulate with walker on unit.   Depression Stable, continue Effexor 75mg  and Mirtazapine 7.5mg  qhs. Sleeps well at night, 06/12/15 TSH 1.09. Hgb a1c 5.3   Insomnia Stable, continue Mirtazapine 7.5mg , Melatonin 3mg       Family/ staff Communication:continue SNF for care assistance.   Labs/tests ordered: none

## 2015-10-07 NOTE — Assessment & Plan Note (Signed)
Stable, continue Senokot S II bid and MOM q 3days, Doculax suppository prn.

## 2015-10-07 NOTE — Assessment & Plan Note (Signed)
Controlled, continue Amlodipine 5mg  

## 2015-10-07 NOTE — Assessment & Plan Note (Signed)
Functioning well in SNF, stays in his room most of time, ambulates with walker, continue Namenda 

## 2015-10-07 NOTE — Assessment & Plan Note (Signed)
Stable, off Omeprazole, continue Zantac.

## 2015-10-07 NOTE — Assessment & Plan Note (Signed)
Stable, continue Effexor 75mg  and Mirtazapine 7.5mg  qhs. Sleeps well at night, 06/12/15 TSH 1.09. Hgb a1c 5.3

## 2015-10-07 NOTE — Assessment & Plan Note (Signed)
06/12/15 Hgb 10.1

## 2015-11-11 ENCOUNTER — Encounter: Payer: Self-pay | Admitting: Nurse Practitioner

## 2015-11-11 ENCOUNTER — Non-Acute Institutional Stay (SKILLED_NURSING_FACILITY): Payer: Medicare Other | Admitting: Nurse Practitioner

## 2015-11-11 DIAGNOSIS — N183 Chronic kidney disease, stage 3 unspecified: Secondary | ICD-10-CM

## 2015-11-11 DIAGNOSIS — F03918 Unspecified dementia, unspecified severity, with other behavioral disturbance: Secondary | ICD-10-CM

## 2015-11-11 DIAGNOSIS — K5900 Constipation, unspecified: Secondary | ICD-10-CM

## 2015-11-11 DIAGNOSIS — M5440 Lumbago with sciatica, unspecified side: Secondary | ICD-10-CM | POA: Diagnosis not present

## 2015-11-11 DIAGNOSIS — N400 Enlarged prostate without lower urinary tract symptoms: Secondary | ICD-10-CM | POA: Diagnosis not present

## 2015-11-11 DIAGNOSIS — G20 Parkinson's disease: Secondary | ICD-10-CM | POA: Diagnosis not present

## 2015-11-11 DIAGNOSIS — G20C Parkinsonism, unspecified: Secondary | ICD-10-CM

## 2015-11-11 DIAGNOSIS — N189 Chronic kidney disease, unspecified: Secondary | ICD-10-CM | POA: Diagnosis not present

## 2015-11-11 DIAGNOSIS — F329 Major depressive disorder, single episode, unspecified: Secondary | ICD-10-CM

## 2015-11-11 DIAGNOSIS — K219 Gastro-esophageal reflux disease without esophagitis: Secondary | ICD-10-CM

## 2015-11-11 DIAGNOSIS — F0391 Unspecified dementia with behavioral disturbance: Secondary | ICD-10-CM | POA: Diagnosis not present

## 2015-11-11 DIAGNOSIS — G4700 Insomnia, unspecified: Secondary | ICD-10-CM

## 2015-11-11 DIAGNOSIS — I10 Essential (primary) hypertension: Secondary | ICD-10-CM | POA: Diagnosis not present

## 2015-11-11 DIAGNOSIS — D631 Anemia in chronic kidney disease: Secondary | ICD-10-CM

## 2015-11-11 DIAGNOSIS — F32A Depression, unspecified: Secondary | ICD-10-CM

## 2015-11-11 NOTE — Assessment & Plan Note (Addendum)
Controlled, continue Amlodipine 5mg , 06/12/15 Na 138, K 4.3, Bun 42, creat 2.62

## 2015-11-11 NOTE — Assessment & Plan Note (Signed)
Functioning well in SNF, stays in his room most of time, ambulates with walker, continue Namenda 

## 2015-11-11 NOTE — Assessment & Plan Note (Signed)
06/12/15 Hgb 10.1

## 2015-11-11 NOTE — Assessment & Plan Note (Signed)
Stable, continue Mirtazapine 7.5mg , Melatonin 3mg 

## 2015-11-11 NOTE — Progress Notes (Signed)
Location:   [place] Room Number: 4 Place of Service:  SNF (31) Provider:  Vona Whiters, Manx[place]  NP  Jeanmarie Hubert, MD  Patient Care Team: Estill Dooms, MD as PCP - General (Internal Medicine) Kam Kushnir Otho Darner, NP as Nurse Practitioner (Internal Medicine)  Extended Emergency Contact Information Primary Emergency Contact: Phillis Haggis [address] [place] of [place] Phone: [phone] Relation: Nephew  Code Status:  DNR Goals of care: Advanced Directive information Advanced Directives 11/11/2015  Does patient have an advance directive? Yes  Type of Paramedic of [place];Out of facility DNR (pink MOST or yellow form)  Does patient want to make changes to advanced directive? No - Patient declined  Copy of advanced directive(s) in chart? Yes  Pre-existing out of facility DNR order (yellow form or pink MOST form) -     Chief Complaint  Patient presents with  . Medical Management of Chronic Issues    HPI:  Pt is a 82 y.o. male seen today for medical management of chronic diseases.      Hx of HTN controlled while on Amlodipine 5mg , GERD is managed on Zantac, off Omeprazole, dementia has been stabilized on Namenda, resides in SNF for care needs. Hx if chronic kidney disease with baseline creat 2s. He is forgetful, but able to voice his needs while on Namenda Hx of BPH with recurrent urinary retention is the past, taking Tamsulosin and Finasteride.  Past Medical History:  Diagnosis Date  . Anemia   . Anxiety   . Back pain   . Cataract   . Chronic kidney disease 2013   rhabdo  . CKD (chronic kidney disease) stage 3, GFR 30-59 ml/min 08/29/2012   Creatinine 1.88 07/05/13(baseline 1.5-2.0) 02/11/14 creatinine 2.00 05/16/14 creatinine 2.31 07/18/14 creatinine 2.22     . CVA (cerebral infarction)   . Cystic disease of liver   . Dementia with behavioral disturbance 06/06/2012  . Depression 06/06/2012  . Diverticulosis   . Edema     . Gait disorder   . GERD (gastroesophageal reflux disease)   . H/O hiatal hernia 2008   repaired  . Hearing loss   . Hearing loss   . Hypertension   . Hyponatremia   . Inguinal hernia   . Keratosis   . Paresthesia   . Parkinsonism ([place])   . PVC (premature ventricular contraction)   . Sciatica   . Sciatica   . Stroke [facility]) 2010   no deficits  . Thyroid disease   . Vitamin D deficiency disease    Past Surgical History:  Procedure Laterality Date  . APPENDECTOMY    . ESOPHAGOGASTRODUODENOSCOPY  10/25/2011   Procedure: ESOPHAGOGASTRODUODENOSCOPY (EGD);  Surgeon: Winfield Cunas., MD;  Location: [facility] ENDOSCOPY;  Service: Endoscopy;  Laterality: N/A;  . HERNIA REPAIR    . PROSTATE SURGERY     cancer    Allergies  Allergen Reactions  . Olanzapine Other (See Comments)    unknown  . Rivastigmine Nausea Only      Medication List       Accurate as of 11/11/15  3:11 PM. Always use your most recent med list.          acetaminophen 500 MG tablet Commonly known as:  TYLENOL Take 500 mg by mouth at bedtime.   acetaminophen 325 MG tablet Commonly known as:  TYLENOL Take 650 mg by mouth every 4 (four) hours as needed.  amLODipine 5 MG tablet Commonly known as:  NORVASC Take 1 tablet (5 mg total) by mouth every morning.   bisacodyl 10 MG suppository Commonly known as:  DULCOLAX Place 10 mg rectally daily as needed for moderate constipation.   feeding supplement Liqd Take 237 mLs by mouth. Take one can daily as needed   finasteride 5 MG tablet Commonly known as:  PROSCAR Take 5 mg by mouth daily.   Fish Oil 1000 MG Caps Take 2 capsules by mouth daily.   fluticasone 50 MCG/ACT nasal spray Commonly known as:  FLONASE Place 1 spray into both nostrils daily.   loteprednol 0.5 % ophthalmic suspension Commonly known as:  LOTEMAX Place 1 drop into the left eye daily.   magnesium hydroxide 400 MG/5ML suspension Commonly known as:  MILK OF MAGNESIA Take 30 mLs  by mouth. Please take 30 ml by mouth every other day  prn for constipation.   Melatonin 3 MG Tabs Take 3 mg by mouth at bedtime.   mirtazapine 7.5 MG tablet Commonly known as:  REMERON Take 7.5 mg by mouth at bedtime.   NAMENDA XR 28 MG Cp24 24 hr capsule Generic drug:  memantine Take one daily for memory   potassium chloride 10 MEQ tablet Commonly known as:  K-DUR Take 10 mEq by mouth daily.   PREVIDENT 5000 PLUS 1.1 % Crea dental cream Generic drug:  sodium fluoride Place 1 application onto teeth every evening.   senna 8.6 MG tablet Commonly known as:  SENOKOT Take 2 tablets by mouth 2 (two) times daily.   sodium chloride 2 % ophthalmic solution Commonly known as:  MURO 128 Place 1 drop into the left eye. Three times a day   sodium chloride 2 % ophthalmic solution Commonly known as:  MURO 128 Place 1 drop into the left eye at bedtime.   SYSTANE 0.4-0.3 % Soln Generic drug:  Polyethyl Glycol-Propyl Glycol Place 1 drop into both eyes 3 (three) times daily.   tamsulosin 0.4 MG Caps capsule Commonly known as:  FLOMAX Take 0.4 mg by mouth every morning.   venlafaxine 75 MG tablet Commonly known as:  EFFEXOR Take 75 mg by mouth daily.   Vitamin D3 10000 units Tabs Take 1 tablet by mouth daily.       Review of Systems  Constitutional: Negative for chills, diaphoresis and fever.  HENT: Positive for congestion, hearing loss and sinus pressure. Negative for ear discharge, ear pain, nosebleeds, postnasal drip, rhinorrhea, sore throat and tinnitus.        Nasal congestion at night  Eyes: Negative for photophobia, pain, discharge and redness.  Respiratory: Negative for cough, shortness of breath, wheezing and stridor.   Cardiovascular: Negative for chest pain, palpitations and leg swelling.  Gastrointestinal: Negative for abdominal pain, blood in stool, constipation, diarrhea, nausea and vomiting.  Endocrine: Negative for polydipsia.  Genitourinary: Positive for  frequency. Negative for dysuria, flank pain, hematuria and urgency.  Musculoskeletal: Positive for back pain and gait problem. Negative for myalgias and neck pain.  Skin: Negative for rash.  Allergic/Immunologic: Negative for environmental allergies.  Neurological: Negative for dizziness, tremors, seizures, weakness and headaches.  Hematological: Does not bruise/bleed easily.  Psychiatric/Behavioral: Negative for hallucinations and suicidal ideas. The patient is not nervous/anxious.     Immunization History  Administered Date(s) Administered  . DTaP 10/31/2011  . Influenza Whole 12/20/2012  . Influenza-Unspecified 12/06/2013, 12/06/2014  . Pneumococcal-Unspecified 10/31/2011  . Tdap 10/24/2011   Pertinent  Health Maintenance Due  Topic Date  Due  . PNA vac Low Risk Adult (2 of 2 - PCV13) 10/30/2012  . INFLUENZA VACCINE  09/30/2015   Fall Risk  09/12/2014 05/23/2014  Falls in the past year? Yes No  Number falls in past yr: 1 -  Injury with Fall? No -  Risk for fall due to : - History of fall(s);Impaired balance/gait   Functional Status Survey:    Vitals:   11/11/15 1424  BP: 137/81  Pulse: 87  Resp: 20  Temp: 98 F (36.7 C)  Weight: 150 lb (68 kg)  Height: 5' (1.524 m)   Body mass index is 29.29 kg/m. Physical Exam  Constitutional: He appears well-developed and well-nourished.  HENT:  Head: Normocephalic and atraumatic. Not macrocephalic.  Nose: No mucosal edema or rhinorrhea. Right sinus exhibits no maxillary sinus tenderness and no frontal sinus tenderness. Left sinus exhibits no maxillary sinus tenderness and no frontal sinus tenderness.  Mouth/Throat: Uvula is midline. Posterior oropharyngeal erythema present. No oropharyngeal exudate or posterior oropharyngeal edema.  Eyes: EOM are normal. Pupils are equal, round, and reactive to light.  Neck: Normal range of motion. Neck supple. No JVD present. No thyromegaly present.  Cardiovascular: Normal rate and regular  rhythm.   Murmur heard.  Systolic murmur is present with a grade of 2/6  Pulmonary/Chest: Effort normal and breath sounds normal. He has no wheezes. He has no rales.  Abdominal: Soft. Bowel sounds are normal. There is no tenderness.  Musculoskeletal: Normal range of motion. He exhibits tenderness. He exhibits no edema.  Scoliokyphosis, walking with walker with SBA. Chronic back pain. 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  Lymphadenopathy:    He has no cervical adenopathy.  Neurological: He is alert. He displays normal reflexes. No cranial nerve deficit. He exhibits normal muscle tone. Coordination normal.  Skin: Skin is warm and dry. No rash noted. No erythema.  AK top of head  Psychiatric: His mood appears not anxious. His affect is not angry, not blunt and not labile. His speech is not delayed and not slurred. He is not agitated, not aggressive, not slowed, not withdrawn, not actively hallucinating and not combative. Thought content is not paranoid and not delusional. Cognition and memory are impaired. He expresses impulsivity. He does not exhibit a depressed mood. He exhibits abnormal recent memory. He is attentive.    Labs reviewed:  Recent Labs  06/12/15  NA 138  K 4.3  BUN 42*  CREATININE 2.6*    Recent Labs  06/12/15  AST 12*  ALT 8*  ALKPHOS 76    Recent Labs  06/12/15  WBC 9.1  HGB 10.1*  HCT 31*  PLT 204   Lab Results  Component Value Date   TSH 1.09 06/12/2015   Lab Results  Component Value Date   HGBA1C 5.3 06/12/2015   No results found for: CHOL, HDL, LDLCALC, LDLDIRECT, TRIG, CHOLHDL  Significant Diagnostic Results in last 30 days:  No results found.  Assessment/Plan There are no diagnoses linked to this encounter.Hypertension Controlled, continue Amlodipine 5mg , 06/12/15 Na 138, K 4.3, Bun 42, creat 2.62     GERD (gastroesophageal reflux disease) Stable, off Omeprazole, continue Zantac.    Constipation Stable, continue Senokot  S II bid and MOM q 3days, Doculax suppository prn.     Dementia with behavioral disturbance Functioning well in SNF, stays in his room most of time, ambulates with walker, continue Namenda   Parkinsonism Not disabling, not on meds    CKD (chronic kidney disease)  stage 3, GFR 30-59 ml/min Baseline creatinine 2s, chronic, 06/12/15 Na 138, K 4.3, Bun 42, creat 2.62  BPH (benign prostatic hyperplasia) Last urinary retention, Foley inserted 11/04/12 at ED-successfully removed--continued Tamsulosin and Finasteride    Anemia in chronic kidney disease 06/12/15 Hgb 10.1  Back pain Back pain, controlled on Tylenol, off Oxycodone since 07/16/14, still able to ambulate with walker on unit.    Depression Stable, continue Effexor 75mg  and Mirtazapine 7.5mg  qhs. Sleeps well at night, 06/12/15 TSH 1.09. Hgb a1c 5.3    Insomnia Stable, continue Mirtazapine 7.5mg , Melatonin 3mg        Family/ staff Communication: continue SNF for care assistance  Labs/tests ordered:  none

## 2015-11-11 NOTE — Assessment & Plan Note (Signed)
Baseline creatinine 2s, chronic, 06/12/15 Na 138, K 4.3, Bun 42, creat 2.62

## 2015-11-11 NOTE — Assessment & Plan Note (Signed)
Last urinary retention, Foley inserted 11/04/12 at ED-successfully removed--continued Tamsulosin and Finasteride 

## 2015-11-11 NOTE — Assessment & Plan Note (Signed)
Stable, continue Effexor 75mg  and Mirtazapine 7.5mg  qhs. Sleeps well at night, 06/12/15 TSH 1.09. Hgb a1c 5.3

## 2015-11-11 NOTE — Assessment & Plan Note (Signed)
Stable, continue Senokot S II bid and MOM q 3days, Doculax suppository prn.

## 2015-11-11 NOTE — Assessment & Plan Note (Signed)
Not disabling, not on meds.  

## 2015-11-11 NOTE — Assessment & Plan Note (Signed)
Stable, off Omeprazole, continue Zantac.

## 2015-11-11 NOTE — Assessment & Plan Note (Signed)
Back pain, controlled on Tylenol, off Oxycodone since 07/16/14, still able to ambulate with walker on unit.

## 2015-11-14 DIAGNOSIS — R3914 Feeling of incomplete bladder emptying: Secondary | ICD-10-CM | POA: Diagnosis not present

## 2015-12-03 DIAGNOSIS — R339 Retention of urine, unspecified: Secondary | ICD-10-CM | POA: Diagnosis not present

## 2015-12-04 ENCOUNTER — Non-Acute Institutional Stay (SKILLED_NURSING_FACILITY): Payer: Medicare Other | Admitting: Internal Medicine

## 2015-12-04 ENCOUNTER — Encounter: Payer: Self-pay | Admitting: Internal Medicine

## 2015-12-04 DIAGNOSIS — N183 Chronic kidney disease, stage 3 unspecified: Secondary | ICD-10-CM

## 2015-12-04 DIAGNOSIS — R269 Unspecified abnormalities of gait and mobility: Secondary | ICD-10-CM | POA: Diagnosis not present

## 2015-12-04 DIAGNOSIS — N189 Chronic kidney disease, unspecified: Secondary | ICD-10-CM

## 2015-12-04 DIAGNOSIS — G309 Alzheimer's disease, unspecified: Secondary | ICD-10-CM

## 2015-12-04 DIAGNOSIS — D631 Anemia in chronic kidney disease: Secondary | ICD-10-CM | POA: Diagnosis not present

## 2015-12-04 DIAGNOSIS — F0281 Dementia in other diseases classified elsewhere with behavioral disturbance: Secondary | ICD-10-CM | POA: Diagnosis not present

## 2015-12-04 DIAGNOSIS — K219 Gastro-esophageal reflux disease without esophagitis: Secondary | ICD-10-CM | POA: Diagnosis not present

## 2015-12-04 DIAGNOSIS — I10 Essential (primary) hypertension: Secondary | ICD-10-CM | POA: Diagnosis not present

## 2015-12-04 DIAGNOSIS — W19XXXA Unspecified fall, initial encounter: Secondary | ICD-10-CM | POA: Diagnosis not present

## 2015-12-04 DIAGNOSIS — G308 Other Alzheimer's disease: Secondary | ICD-10-CM

## 2015-12-04 NOTE — Progress Notes (Signed)
Progress Note    Location:  [place] Room Number: N04 Place of Service:  SNF [phone]) Provider:   Jeanmarie Hubert, MD  Patient Care Team: Estill Dooms, MD as PCP - General (Internal Medicine) Man Otho Darner, NP as Nurse Practitioner (Internal Medicine)  Extended Emergency Contact Information Primary Emergency Contact: Phillis Haggis [address] [place] Phone: [phone] Relation: Nephew  Code Status:  DNR Goals of care: Advanced Directive information Advanced Directives 12/04/2015  Does patient have an advance directive? Yes  Type of Paramedic of [place];Living will;Out of facility DNR (pink MOST or yellow form)  Does patient want to make changes to advanced directive? -  Copy of advanced directive(s) in chart? Yes  Pre-existing out of facility DNR order (yellow form or pink MOST form) Yellow form placed in chart (order not valid for inpatient use);Pink MOST form placed in chart (order not valid for inpatient use)     Chief Complaint  Patient presents with  . Acute Visit    "not feeling good" for few days, more confused.  Vincent Potts today, no injury     HPI:  Pt is a 82 y.o. male seen today for an acute visit for follow up of a fall and report of increased confusion.  Patient responds appropriately. He says he did not get hurt with the fall. He is mumbling at times, coughing and hoarse. Afebrile.  BP are in normal to low range on amlodipine 5 mg. There is small amount of edema.   Past Medical History:  Diagnosis Date  . Anemia   . Anxiety   . Back pain   . Cataract   . Chronic kidney disease 2013   rhabdo  . CKD (chronic kidney disease) stage 3, GFR 30-59 ml/min 08/29/2012   Creatinine 1.88 07/05/13(baseline 1.5-2.0) 02/11/14 creatinine 2.00 05/16/14 creatinine 2.31 07/18/14 creatinine 2.22     . CVA (cerebral infarction)   . Cystic disease of liver   . Dementia with behavioral disturbance  06/06/2012  . Depression 06/06/2012  . Diverticulosis   . Edema   . Gait disorder   . GERD (gastroesophageal reflux disease)   . H/O hiatal hernia 2008   repaired  . Hearing loss   . Hearing loss   . Hypertension   . Hyponatremia   . Inguinal hernia   . Keratosis   . Paresthesia   . Parkinsonism ([place])   . PVC (premature ventricular contraction)   . Sciatica   . Sciatica   . Stroke [facility]) 2010   no deficits  . Thyroid disease   . Vitamin D deficiency disease    Past Surgical History:  Procedure Laterality Date  . APPENDECTOMY    . ESOPHAGOGASTRODUODENOSCOPY  10/25/2011   Procedure: ESOPHAGOGASTRODUODENOSCOPY (EGD);  Surgeon: Winfield Cunas., MD;  Location: [facility] ENDOSCOPY;  Service: Endoscopy;  Laterality: N/A;  . HERNIA REPAIR    . PROSTATE SURGERY     cancer    Allergies  Allergen Reactions  . Olanzapine Other (See Comments)    unknown  . Rivastigmine Nausea Only      Medication List       Accurate as of 12/04/15 12:25 PM. Always use your most recent med list.          acetaminophen 500 MG tablet Commonly known as:  TYLENOL Take 500 mg by mouth at bedtime.   acetaminophen 325 MG tablet Commonly  known as:  TYLENOL Take 650 mg by mouth every 4 (four) hours as needed.   amLODipine 5 MG tablet Commonly known as:  NORVASC Take 1 tablet (5 mg total) by mouth every morning.   bisacodyl 10 MG suppository Commonly known as:  DULCOLAX Place 10 mg rectally daily as needed for moderate constipation.   feeding supplement Liqd Take 237 mLs by mouth. Take one can daily as needed   finasteride 5 MG tablet Commonly known as:  PROSCAR Take 5 mg by mouth daily.   Fish Oil 1000 MG Caps Take 2 capsules by mouth daily.   fluticasone 50 MCG/ACT nasal spray Commonly known as:  FLONASE Place 1 spray into both nostrils daily.   loteprednol 0.5 % ophthalmic suspension Commonly known as:  LOTEMAX Place 1 drop into the left eye daily.   magnesium hydroxide 400 MG/5ML  suspension Commonly known as:  MILK OF MAGNESIA Take 30 mLs by mouth. take 30 ml by mouth daily   prn for constipation.   Melatonin 3 MG Tabs Take 3 mg by mouth at bedtime.   NAMENDA XR 28 MG Cp24 24 hr capsule Generic drug:  memantine Take one daily for memory   potassium chloride 10 MEQ tablet Commonly known as:  K-DUR Take 10 mEq by mouth daily.   PREVIDENT 5000 PLUS 1.1 % Crea dental cream Generic drug:  sodium fluoride Place 1 application onto teeth every evening.   senna 8.6 MG tablet Commonly known as:  SENOKOT Take 2 tablets by mouth 2 (two) times daily.   sodium chloride 2 % ophthalmic solution Commonly known as:  MURO 128 Place 1 drop into the left eye. Three times a day   sodium chloride 2 % ophthalmic solution Commonly known as:  MURO 128 Place 1 drop into the left eye at bedtime.   SYSTANE 0.4-0.3 % Soln Generic drug:  Polyethyl Glycol-Propyl Glycol Place 1 drop into both eyes 3 (three) times daily.   tamsulosin 0.4 MG Caps capsule Commonly known as:  FLOMAX Take 0.4 mg by mouth every morning.   venlafaxine 75 MG tablet Commonly known as:  EFFEXOR Take 75 mg by mouth daily.   Vitamin D3 10000 units Tabs Take 1 tablet by mouth daily.       Review of Systems  Constitutional: Negative for chills, diaphoresis and fever.  HENT: Positive for congestion, hearing loss, sinus pressure and voice change (hoarse). Negative for ear discharge, ear pain, nosebleeds, postnasal drip, rhinorrhea, sore throat and tinnitus.        Nasal congestion at night  Eyes: Negative for photophobia, pain, discharge and redness.  Respiratory: Negative for cough, shortness of breath, wheezing and stridor.   Cardiovascular: Negative for chest pain, palpitations and leg swelling.  Gastrointestinal: Negative for abdominal pain, blood in stool, constipation, diarrhea, nausea and vomiting.  Endocrine: Negative for polydipsia.  Genitourinary: Positive for frequency. Negative for  dysuria, flank pain, hematuria and urgency.  Musculoskeletal: Positive for back pain and gait problem. Negative for myalgias and neck pain.  Skin: Negative for rash.  Allergic/Immunologic: Negative for environmental allergies.  Neurological: Negative for dizziness, tremors, seizures, weakness and headaches.  Hematological: Does not bruise/bleed easily.  Psychiatric/Behavioral: Negative for hallucinations and suicidal ideas. The patient is not nervous/anxious.     Immunization History  Administered Date(s) Administered  . DTaP 10/31/2011  . Influenza Whole 12/20/2012  . Influenza-Unspecified 12/06/2013, 12/06/2014  . Pneumococcal-Unspecified 10/31/2011  . Tdap 10/24/2011   Pertinent  Health Maintenance Due  Topic Date  Due  . PNA vac Low Risk Adult (2 of 2 - PCV13) 10/30/2012  . INFLUENZA VACCINE  09/30/2015   Fall Risk  09/12/2014 05/23/2014  Falls in the past year? Yes No  Number falls in past yr: 1 -  Injury with Fall? No -  Risk for fall due to : - History of fall(s);Impaired balance/gait   Functional Status Survey:    Vitals:   12/04/15 1211  BP: (!) 160/80  Pulse: 88  Resp: 18  Temp: 97.7 F (36.5 C)  SpO2: 95%  Weight: 150 lb (68 kg)  Height: 5' (1.524 m)   Body mass index is 29.29 kg/m. Physical Exam  Constitutional: He appears well-developed and well-nourished.  HENT:  Head: Normocephalic and atraumatic. Not macrocephalic.  Nose: No mucosal edema or rhinorrhea. Right sinus exhibits no maxillary sinus tenderness and no frontal sinus tenderness. Left sinus exhibits no maxillary sinus tenderness and no frontal sinus tenderness.  Mouth/Throat: Uvula is midline. Posterior oropharyngeal erythema present. No oropharyngeal exudate or posterior oropharyngeal edema.  Eyes: EOM are normal. Pupils are equal, round, and reactive to light.  Neck: Normal range of motion. Neck supple. No JVD present. No thyromegaly present.  Cardiovascular: Normal rate and regular rhythm.     Murmur heard.  Systolic murmur is present with a grade of 2/6  Pulmonary/Chest: Effort normal and breath sounds normal. He has no wheezes. He has no rales.  Abdominal: Soft. Bowel sounds are normal. There is no tenderness.  Musculoskeletal: Normal range of motion. He exhibits tenderness. He exhibits no edema.  Scoliokyphosis, walking with walker with SBA. Chronic back pain. 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  Lymphadenopathy:    He has no cervical adenopathy.  Neurological: He is alert. He displays normal reflexes. No cranial nerve deficit. He exhibits normal muscle tone. Coordination normal.  Mumbling and incoherent answers sometimes.  Skin: Skin is warm and dry. No rash noted. No erythema.  AK top of head  Psychiatric: His mood appears not anxious. His affect is not angry, not blunt and not labile. His speech is not delayed and not slurred. He is not agitated, not aggressive, not slowed, not withdrawn, not actively hallucinating and not combative. Thought content is not paranoid and not delusional. Cognition and memory are impaired. He expresses impulsivity. He does not exhibit a depressed mood. He exhibits abnormal recent memory. He is attentive.    Labs reviewed:  Recent Labs  06/12/15  NA 138  K 4.3  BUN 42*  CREATININE 2.6*    Recent Labs  06/12/15  AST 12*  ALT 8*  ALKPHOS 76    Recent Labs  06/12/15  WBC 9.1  HGB 10.1*  HCT 31*  PLT 204   Lab Results  Component Value Date   TSH 1.09 06/12/2015   Lab Results  Component Value Date   HGBA1C 5.3 06/12/2015   No results found for: CHOL, HDL, LDLCALC, LDLDIRECT, TRIG, CHOLHDL  Significant Diagnostic Results in last 30 days:  No results found.  Assessment/Plan 1. Fall, initial encounter Simple mechanical fall  2. Essential hypertension D/C amlodipine D/C KCl  3. CKD (chronic kidney disease) stage 3, GFR 30-59 ml/min -CMP  4. Alzheimer's dementia with behavioral disturbance, unspecified  timing of dementia onset unchanged  5. Gait disorder At risk for future falls  6. Gastroesophageal reflux disease without esophagitis asynmptomatic  7. Anemia in chronic kidney disease, unspecified CKD stage -CBC

## 2015-12-06 DIAGNOSIS — N179 Acute kidney failure, unspecified: Secondary | ICD-10-CM | POA: Diagnosis not present

## 2015-12-06 DIAGNOSIS — N200 Calculus of kidney: Secondary | ICD-10-CM | POA: Diagnosis not present

## 2015-12-06 DIAGNOSIS — N399 Disorder of urinary system, unspecified: Secondary | ICD-10-CM | POA: Diagnosis not present

## 2015-12-06 LAB — HEPATIC FUNCTION PANEL
ALK PHOS: 67 U/L (ref 25–125)
ALT: 11 U/L (ref 10–40)
AST: 18 U/L (ref 14–40)
BILIRUBIN, TOTAL: 0.3 mg/dL

## 2015-12-06 LAB — CBC AND DIFFERENTIAL
HEMATOCRIT: 30 % — AB (ref 41–53)
HEMOGLOBIN: 10.1 g/dL — AB (ref 13.5–17.5)
Platelets: 216 10*3/uL (ref 150–399)
WBC: 5.9 10*3/mL

## 2015-12-06 LAB — BASIC METABOLIC PANEL
BUN: 27 mg/dL — AB (ref 4–21)
Creatinine: 2.5 mg/dL — AB (ref <=1.3)
Glucose: 88 mg/dL
Potassium: 3.9 mmol/L (ref 3.4–5.3)
Sodium: 125 mmol/L — AB (ref 137–147)

## 2015-12-08 DIAGNOSIS — N179 Acute kidney failure, unspecified: Secondary | ICD-10-CM | POA: Diagnosis not present

## 2015-12-08 DIAGNOSIS — D649 Anemia, unspecified: Secondary | ICD-10-CM | POA: Diagnosis not present

## 2015-12-08 LAB — BASIC METABOLIC PANEL
BUN: 23 mg/dL — AB (ref 4–21)
Creatinine: 2.6 mg/dL — AB (ref <=1.3)
GLUCOSE: 89 mg/dL
POTASSIUM: 3.7 mmol/L (ref 3.4–5.3)
SODIUM: 126 mmol/L — AB (ref 137–147)

## 2015-12-08 LAB — CBC AND DIFFERENTIAL
HEMATOCRIT: 29 % — AB (ref 41–53)
HEMOGLOBIN: 10 g/dL — AB (ref 13.5–17.5)
Platelets: 214 10*3/uL (ref 150–399)
WBC: 7.6 10*3/mL

## 2015-12-08 LAB — HEPATIC FUNCTION PANEL
ALT: 11 U/L (ref 10–40)
AST: 15 U/L (ref 14–40)
Alkaline Phosphatase: 77 U/L (ref 25–125)
BILIRUBIN, TOTAL: 0.5 mg/dL

## 2015-12-09 ENCOUNTER — Encounter: Payer: Self-pay | Admitting: Nurse Practitioner

## 2015-12-09 ENCOUNTER — Other Ambulatory Visit: Payer: Self-pay | Admitting: *Deleted

## 2015-12-09 DIAGNOSIS — I10 Essential (primary) hypertension: Secondary | ICD-10-CM | POA: Diagnosis not present

## 2015-12-09 LAB — BASIC METABOLIC PANEL
BUN: 25 mg/dL — AB (ref 4–21)
CREATININE: 2.4 mg/dL — AB (ref <=1.3)
Glucose: 126 mg/dL
POTASSIUM: 3.8 mmol/L (ref 3.4–5.3)
Sodium: 125 mmol/L — AB (ref 137–147)

## 2015-12-11 ENCOUNTER — Encounter: Payer: Self-pay | Admitting: Nurse Practitioner

## 2015-12-11 ENCOUNTER — Non-Acute Institutional Stay (SKILLED_NURSING_FACILITY): Payer: Medicare Other | Admitting: Nurse Practitioner

## 2015-12-11 DIAGNOSIS — F0151 Vascular dementia with behavioral disturbance: Secondary | ICD-10-CM

## 2015-12-11 DIAGNOSIS — N183 Chronic kidney disease, stage 3 unspecified: Secondary | ICD-10-CM

## 2015-12-11 DIAGNOSIS — N182 Chronic kidney disease, stage 2 (mild): Secondary | ICD-10-CM

## 2015-12-11 DIAGNOSIS — I10 Essential (primary) hypertension: Secondary | ICD-10-CM | POA: Diagnosis not present

## 2015-12-11 DIAGNOSIS — G4700 Insomnia, unspecified: Secondary | ICD-10-CM | POA: Diagnosis not present

## 2015-12-11 DIAGNOSIS — K5900 Constipation, unspecified: Secondary | ICD-10-CM | POA: Diagnosis not present

## 2015-12-11 DIAGNOSIS — K219 Gastro-esophageal reflux disease without esophagitis: Secondary | ICD-10-CM | POA: Diagnosis not present

## 2015-12-11 DIAGNOSIS — E871 Hypo-osmolality and hyponatremia: Secondary | ICD-10-CM

## 2015-12-11 DIAGNOSIS — D631 Anemia in chronic kidney disease: Secondary | ICD-10-CM

## 2015-12-11 DIAGNOSIS — N400 Enlarged prostate without lower urinary tract symptoms: Secondary | ICD-10-CM

## 2015-12-11 DIAGNOSIS — F01518 Vascular dementia, unspecified severity, with other behavioral disturbance: Secondary | ICD-10-CM

## 2015-12-11 DIAGNOSIS — F320 Major depressive disorder, single episode, mild: Secondary | ICD-10-CM

## 2015-12-11 DIAGNOSIS — M5440 Lumbago with sciatica, unspecified side: Secondary | ICD-10-CM | POA: Diagnosis not present

## 2015-12-11 LAB — BASIC METABOLIC PANEL
BUN: 23 mg/dL — AB (ref 4–21)
Creatinine: 2.3 mg/dL — AB (ref <=1.3)
GLUCOSE: 96 mg/dL
Potassium: 3.5 mmol/L (ref 3.4–5.3)
SODIUM: 129 mmol/L — AB (ref 137–147)

## 2015-12-11 NOTE — Progress Notes (Signed)
Location:  [place] Room Number: 4 Place of Service:  SNF (31) Provider:  Mast, Manxie  NP  Jeanmarie Hubert, MD  Patient Care Team: Estill Dooms, MD as PCP - General (Internal Medicine) Man Otho Darner, NP as Nurse Practitioner (Internal Medicine)  Extended Emergency Contact Information Primary Emergency Contact: Phillis Haggis [address] [place] Phone: [phone] Relation: Nephew  Code Status:  DNR Goals of care: Advanced Directive information Advanced Directives 12/11/2015  Does patient have an advance directive? Yes  Type of Paramedic of [place];Living will;Out of facility DNR (pink MOST or yellow form)  Does patient want to make changes to advanced directive? No - Patient declined  Copy of advanced directive(s) in chart? Yes  Pre-existing out of facility DNR order (yellow form or pink MOST form) -     Chief Complaint  Patient presents with  . Acute Visit    HPI:  Pt is a 82 y.o. male seen today for an acute visit for hyponatremia, Na 126 12/09/15, IVF NS 800cc, Na 129, the patient stated his appetite remains the same. Denied nausea, vomiting, constipation, diarrhea, dysuria, or cough. He is afebrile. C/o not sleeping well for a few days, off Mirtazapine per pharm recommendation since 11/18/15.    Hx of HTN controlled while on Amlodipine 5mg , GERD is managed on Zantac, off Omeprazole, dementia has been stabilized on Namenda, resides in SNF for care needs. Hx of chronic kidney disease with baseline creat 2s. He is forgetful, but able to voice his needs while on Namenda Hx of BPH with recurrent urinary retention is the past, taking Tamsulosin and Finasteride.    Past Medical History:  Diagnosis Date  . Anemia   . Anxiety   . Back pain   . Cataract   . Chronic kidney disease 2013   rhabdo  . CKD (chronic kidney disease) stage 3, GFR 30-59 ml/min 08/29/2012   Creatinine 1.88 07/05/13(baseline  1.5-2.0) 02/11/14 creatinine 2.00 05/16/14 creatinine 2.31 07/18/14 creatinine 2.22     . CVA (cerebral infarction)   . Cystic disease of liver   . Dementia with behavioral disturbance 06/06/2012  . Depression 06/06/2012  . Diverticulosis   . Edema   . Gait disorder   . GERD (gastroesophageal reflux disease)   . H/O hiatal hernia 2008   repaired  . Hearing loss   . Hearing loss   . Hypertension   . Hyponatremia   . Inguinal hernia   . Keratosis   . Paresthesia   . Parkinsonism ([place])   . PVC (premature ventricular contraction)   . Sciatica   . Sciatica   . Stroke [facility]) 2010   no deficits  . Thyroid disease   . Vitamin D deficiency disease    Past Surgical History:  Procedure Laterality Date  . APPENDECTOMY    . ESOPHAGOGASTRODUODENOSCOPY  10/25/2011   Procedure: ESOPHAGOGASTRODUODENOSCOPY (EGD);  Surgeon: Winfield Cunas., MD;  Location: [facility] ENDOSCOPY;  Service: Endoscopy;  Laterality: N/A;  . HERNIA REPAIR    . PROSTATE SURGERY     cancer    Allergies  Allergen Reactions  . Olanzapine Other (See Comments)    unknown  . Rivastigmine Nausea Only      Medication List       Accurate as of 12/11/15  3:40 PM. Always use your most recent med list.          acetaminophen  500 MG tablet Commonly known as:  TYLENOL Take 500 mg by mouth at bedtime.   acetaminophen 325 MG tablet Commonly known as:  TYLENOL Take 650 mg by mouth every 4 (four) hours as needed.   bisacodyl 10 MG suppository Commonly known as:  DULCOLAX Place 10 mg rectally daily as needed for moderate constipation.   feeding supplement Liqd Take 237 mLs by mouth. Take one can daily as needed   finasteride 5 MG tablet Commonly known as:  PROSCAR Take 5 mg by mouth daily.   Fish Oil 1000 MG Caps Take 2 capsules by mouth daily.   fluticasone 50 MCG/ACT nasal spray Commonly known as:  FLONASE Place 1 spray into both nostrils daily.   loteprednol 0.5 % ophthalmic suspension Commonly known as:   LOTEMAX Place 1 drop into the left eye daily.   magnesium hydroxide 400 MG/5ML suspension Commonly known as:  MILK OF MAGNESIA Take 30 mLs by mouth. take 30 ml by mouth daily   prn for constipation.   Melatonin 3 MG Tabs Take 3 mg by mouth at bedtime.   NAMENDA XR 28 MG Cp24 24 hr capsule Generic drug:  memantine Take one daily for memory   PREVIDENT 5000 PLUS 1.1 % Crea dental cream Generic drug:  sodium fluoride Place 1 application onto teeth every evening.   senna 8.6 MG tablet Commonly known as:  SENOKOT Take 2 tablets by mouth 2 (two) times daily.   sodium chloride 2 % ophthalmic solution Commonly known as:  MURO 128 Place 1 drop into the left eye at bedtime.   SYSTANE 0.4-0.3 % Soln Generic drug:  Polyethyl Glycol-Propyl Glycol Place 1 drop into both eyes 3 (three) times daily.   tamsulosin 0.4 MG Caps capsule Commonly known as:  FLOMAX Take 0.4 mg by mouth every morning.   venlafaxine 75 MG tablet Commonly known as:  EFFEXOR Take 75 mg by mouth daily.   Vitamin D3 10000 units Tabs Take 1 tablet by mouth daily.       Review of Systems  Constitutional: Negative for chills, diaphoresis and fever.  HENT: Positive for congestion, hearing loss, sinus pressure and voice change (hoarse). Negative for ear discharge, ear pain, nosebleeds, postnasal drip, rhinorrhea, sore throat and tinnitus.        Nasal congestion at night  Eyes: Negative for photophobia, pain, discharge and redness.  Respiratory: Negative for cough, shortness of breath, wheezing and stridor.   Cardiovascular: Negative for chest pain, palpitations and leg swelling.  Gastrointestinal: Negative for abdominal pain, blood in stool, constipation, diarrhea, nausea and vomiting.  Endocrine: Negative for polydipsia.  Genitourinary: Positive for frequency. Negative for dysuria, flank pain, hematuria and urgency.  Musculoskeletal: Positive for back pain and gait problem. Negative for myalgias and neck pain.    Skin: Negative for rash.  Allergic/Immunologic: Negative for environmental allergies.  Neurological: Negative for dizziness, tremors, seizures, weakness and headaches.  Hematological: Does not bruise/bleed easily.  Psychiatric/Behavioral: Positive for sleep disturbance. Negative for hallucinations and suicidal ideas. The patient is not nervous/anxious.     Immunization History  Administered Date(s) Administered  . DTaP 10/31/2011  . Influenza Whole 12/20/2012  . Influenza-Unspecified 12/06/2013, 12/06/2014  . Pneumococcal-Unspecified 10/31/2011  . Tdap 10/24/2011   Pertinent  Health Maintenance Due  Topic Date Due  . PNA vac Low Risk Adult (2 of 2 - PCV13) 10/30/2012  . INFLUENZA VACCINE  09/30/2015   Fall Risk  09/12/2014 05/23/2014  Falls in the past year? Yes No  Number  falls in past yr: 1 -  Injury with Fall? No -  Risk for fall due to : - History of fall(s);Impaired balance/gait   Functional Status Survey:    Vitals:   12/11/15 1451  BP: (!) 172/98  Pulse: 74  Resp: 17  Temp: 98.1 F (36.7 C)  Weight: 143 lb (64.9 kg)  Height: 5' (1.524 m)   Body mass index is 27.93 kg/m. Physical Exam  Constitutional: He appears well-developed and well-nourished.  HENT:  Head: Normocephalic and atraumatic. Not macrocephalic.  Nose: No mucosal edema or rhinorrhea. Right sinus exhibits no maxillary sinus tenderness and no frontal sinus tenderness. Left sinus exhibits no maxillary sinus tenderness and no frontal sinus tenderness.  Mouth/Throat: Uvula is midline. Posterior oropharyngeal erythema present. No oropharyngeal exudate or posterior oropharyngeal edema.  Eyes: EOM are normal. Pupils are equal, round, and reactive to light.  Neck: Normal range of motion. Neck supple. No JVD present. No thyromegaly present.  Cardiovascular: Normal rate and regular rhythm.   Murmur heard.  Systolic murmur is present with a grade of 2/6  Pulmonary/Chest: Effort normal and breath sounds  normal. He has no wheezes. He has no rales.  Abdominal: Soft. Bowel sounds are normal. There is no tenderness.  Musculoskeletal: Normal range of motion. He exhibits tenderness. He exhibits no edema.  Scoliokyphosis, walking with walker with SBA. Chronic back pain. 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  Lymphadenopathy:    He has no cervical adenopathy.  Neurological: He is alert. He displays normal reflexes. No cranial nerve deficit. He exhibits normal muscle tone. Coordination normal.  Mumbling and incoherent answers sometimes.  Skin: Skin is warm and dry. No rash noted. No erythema.  AK top of head  Psychiatric: His mood appears not anxious. His affect is not angry, not blunt and not labile. His speech is not delayed and not slurred. He is not agitated, not aggressive, not slowed, not withdrawn, not actively hallucinating and not combative. Thought content is not paranoid and not delusional. Cognition and memory are impaired. He expresses impulsivity. He does not exhibit a depressed mood. He exhibits abnormal recent memory. He is attentive.    Labs reviewed:  Recent Labs  12/06/15 12/08/15 12/09/15  NA 125* 126* 125*  K 3.9 3.7 3.8  BUN 27* 23* 25*  CREATININE 2.5* 2.6* 2.4*    Recent Labs  06/12/15 12/06/15 12/08/15  AST 12* 18 15  ALT 8* 11 11  ALKPHOS 76 67 77    Recent Labs  06/12/15 12/06/15 12/08/15  WBC 9.1 5.9 7.6  HGB 10.1* 10.1* 10.0*  HCT 31* 30* 29*  PLT 204 216 214   Lab Results  Component Value Date   TSH 1.09 06/12/2015   Lab Results  Component Value Date   HGBA1C 5.3 06/12/2015   No results found for: CHOL, HDL, LDLCALC, LDLDIRECT, TRIG, CHOLHDL  Significant Diagnostic Results in last 30 days:  No results found.  Assessment/Plan Hyponatremia 05/16/14 Na 138 12/08/15 wbc 5.9, Hgb 10.1, plt 216, Na 126, K 3.7, Bun 23, creat 2.56. 12/09/15 stat BMP, IVF NS 75cchr x 2068ml, repeat BMP after IVF 12/11/15 about 800cc NS  Infused, Na 129, K  3.5, Bun 23, creat 2.27, update BMP and UA C/S    Insomnia Not sleeping well for a few days, , off Mirtazapine 7.5mg  since 11/18/15, resumed it, continue  Melatonin 3mg     Depression Mood is stable, continue Effexor 75mg  and resume Mirtazapine 7.5mg  qhs due to relapsed insomnia(off  it since 11/18/15).  Sleeps well at night, 06/12/15 TSH 1.09. Hgb a1c 5.3   Back pain Back pain, controlled on Tylenol, off Oxycodone since 07/16/14, still able to ambulate with walker on unit.    Anemia in chronic kidney disease 12/08/15 Hgb 10.1  BPH (benign prostatic hyperplasia) Last urinary retention, Foley inserted 11/04/12 at ED-successfully removed--continued Tamsulosin and Finasteride    CKD (chronic kidney disease) stage 3, GFR 30-59 ml/min 12/11/15 Na 129, K 3.5, Bun 23, creat 2.27   Dementia with behavioral disturbance Functioning well in SNF, stays in his room most of time, ambulates with walker, continue Namenda    Constipation Stable, continue Senokot S II bid and MOM q 3days, Doculax suppository prn.      GERD (gastroesophageal reflux disease) Stable, off Omeprazole, continue Zantac.     Hypertension Controlled, continue Amlodipine 5mg       Family/ staff Communication: continue SNF for care assistance.   Labs/tests ordered: BMP, UA C/S

## 2015-12-11 NOTE — Assessment & Plan Note (Addendum)
Not sleeping well for a few days, , off Mirtazapine 7.5mg  since 11/18/15, resumed it, continue  Melatonin 3mg 

## 2015-12-11 NOTE — Assessment & Plan Note (Signed)
Back pain, controlled on Tylenol, off Oxycodone since 07/16/14, still able to ambulate with walker on unit.

## 2015-12-11 NOTE — Assessment & Plan Note (Signed)
Stable, continue Senokot S II bid and MOM q 3days, Doculax suppository prn.

## 2015-12-11 NOTE — Assessment & Plan Note (Signed)
12/08/15 Hgb 10.1

## 2015-12-11 NOTE — Assessment & Plan Note (Signed)
Stable, off Omeprazole, continue Zantac.

## 2015-12-11 NOTE — Assessment & Plan Note (Signed)
Functioning well in SNF, stays in his room most of time, ambulates with walker, continue Namenda 

## 2015-12-11 NOTE — Assessment & Plan Note (Addendum)
Mood is stable, continue Effexor 75mg  and resume Mirtazapine 7.5mg  qhs due to relapsed insomnia(off it since 11/18/15).  Sleeps well at night, 06/12/15 TSH 1.09. Hgb a1c 5.3

## 2015-12-11 NOTE — Assessment & Plan Note (Addendum)
05/16/14 Na 138 12/08/15 wbc 5.9, Hgb 10.1, plt 216, Na 126, K 3.7, Bun 23, creat 2.56. 12/09/15 stat BMP, IVF NS 75cchr x 2089ml, repeat BMP after IVF 12/11/15 about 800cc NS  Infused, Na 129, K 3.5, Bun 23, creat 2.27, update BMP and UA C/S

## 2015-12-11 NOTE — Assessment & Plan Note (Signed)
Controlled, continue Amlodipine 5mg  

## 2015-12-11 NOTE — Assessment & Plan Note (Signed)
12/11/15 Na 129, K 3.5, Bun 23, creat 2.27

## 2015-12-11 NOTE — Assessment & Plan Note (Signed)
Last urinary retention, Foley inserted 11/04/12 at ED-successfully removed--continued Tamsulosin and Finasteride 

## 2015-12-12 DIAGNOSIS — N390 Urinary tract infection, site not specified: Secondary | ICD-10-CM | POA: Diagnosis not present

## 2015-12-15 DIAGNOSIS — E871 Hypo-osmolality and hyponatremia: Secondary | ICD-10-CM | POA: Diagnosis not present

## 2015-12-15 LAB — BASIC METABOLIC PANEL
BUN: 33 mg/dL — AB (ref 4–21)
CREATININE: 2.7 mg/dL — AB (ref <=1.3)
GLUCOSE: 91 mg/dL
POTASSIUM: 3.7 mmol/L (ref 3.4–5.3)
Sodium: 133 mmol/L — AB (ref 137–147)

## 2015-12-16 ENCOUNTER — Encounter: Payer: Self-pay | Admitting: Nurse Practitioner

## 2015-12-16 ENCOUNTER — Other Ambulatory Visit: Payer: Self-pay | Admitting: *Deleted

## 2015-12-16 DIAGNOSIS — N390 Urinary tract infection, site not specified: Secondary | ICD-10-CM | POA: Insufficient documentation

## 2016-01-11 ENCOUNTER — Encounter (HOSPITAL_COMMUNITY): Payer: Self-pay | Admitting: Emergency Medicine

## 2016-01-11 ENCOUNTER — Emergency Department (HOSPITAL_COMMUNITY): Payer: Medicare Other

## 2016-01-11 ENCOUNTER — Emergency Department (HOSPITAL_COMMUNITY)
Admission: EM | Admit: 2016-01-11 | Discharge: 2016-01-11 | Disposition: A | Discharge status: Home / Self Care | Payer: Medicare Other | Attending: Emergency Medicine | Admitting: Emergency Medicine

## 2016-01-11 DIAGNOSIS — S0990XA Unspecified injury of head, initial encounter: Secondary | ICD-10-CM | POA: Diagnosis present

## 2016-01-11 DIAGNOSIS — G20 Parkinson's disease: Secondary | ICD-10-CM | POA: Insufficient documentation

## 2016-01-11 DIAGNOSIS — Y999 Unspecified external cause status: Secondary | ICD-10-CM | POA: Insufficient documentation

## 2016-01-11 DIAGNOSIS — W19XXXA Unspecified fall, initial encounter: Secondary | ICD-10-CM | POA: Insufficient documentation

## 2016-01-11 DIAGNOSIS — N183 Chronic kidney disease, stage 3 (moderate): Secondary | ICD-10-CM | POA: Diagnosis not present

## 2016-01-11 DIAGNOSIS — I129 Hypertensive chronic kidney disease with stage 1 through stage 4 chronic kidney disease, or unspecified chronic kidney disease: Secondary | ICD-10-CM | POA: Diagnosis not present

## 2016-01-11 DIAGNOSIS — S0091XA Abrasion of unspecified part of head, initial encounter: Secondary | ICD-10-CM | POA: Diagnosis not present

## 2016-01-11 DIAGNOSIS — Y939 Activity, unspecified: Secondary | ICD-10-CM | POA: Insufficient documentation

## 2016-01-11 DIAGNOSIS — S080XXA Avulsion of scalp, initial encounter: Secondary | ICD-10-CM | POA: Diagnosis not present

## 2016-01-11 DIAGNOSIS — S199XXA Unspecified injury of neck, initial encounter: Secondary | ICD-10-CM | POA: Diagnosis not present

## 2016-01-11 DIAGNOSIS — Z8673 Personal history of transient ischemic attack (TIA), and cerebral infarction without residual deficits: Secondary | ICD-10-CM | POA: Insufficient documentation

## 2016-01-11 DIAGNOSIS — Y929 Unspecified place or not applicable: Secondary | ICD-10-CM | POA: Insufficient documentation

## 2016-01-11 DIAGNOSIS — S0003XA Contusion of scalp, initial encounter: Secondary | ICD-10-CM | POA: Diagnosis not present

## 2016-01-11 DIAGNOSIS — S0180XA Unspecified open wound of other part of head, initial encounter: Secondary | ICD-10-CM | POA: Insufficient documentation

## 2016-01-11 DIAGNOSIS — S098XXA Other specified injuries of head, initial encounter: Secondary | ICD-10-CM | POA: Diagnosis not present

## 2016-01-11 DIAGNOSIS — Z79899 Other long term (current) drug therapy: Secondary | ICD-10-CM | POA: Diagnosis not present

## 2016-01-11 NOTE — ED Notes (Signed)
Pt transported to CT ?

## 2016-01-11 NOTE — Discharge Instructions (Signed)
Clean abrasion twice a day gently with soap and water. Then apply bandage follow-up with your family doctor in a week for recheck

## 2016-01-11 NOTE — ED Notes (Signed)
MD at bedside. 

## 2016-01-11 NOTE — ED Notes (Signed)
Laceration to right forehead cleaned and bandaged, per MD; skin tear on right knee from previous fall rebandaged per nephew's request

## 2016-01-11 NOTE — ED Provider Notes (Signed)
[facility] DEPT Provider Note   CSN: [id number] Arrival date & time: 01/11/16  1932     History   Chief Complaint Chief Complaint  Patient presents with  . Fall    HPI Vincent Potts is a 82 y.o. male.  Patient fell hit his head unknown loss of consciousness   The history is provided by a relative. No language interpreter was used.  Fall  This is a new problem. The current episode started 6 to 12 hours ago. The problem occurs every several days. The problem has not changed since onset.Nothing aggravates the symptoms. Nothing relieves the symptoms. He has tried nothing for the symptoms.    Past Medical History:  Diagnosis Date  . Anemia   . Anxiety   . Back pain   . Cataract   . Chronic kidney disease 2013   rhabdo  . CKD (chronic kidney disease) stage 3, GFR 30-59 ml/min 08/29/2012   Creatinine 1.88 07/05/13(baseline 1.5-2.0) 02/11/14 creatinine 2.00 05/16/14 creatinine 2.31 07/18/14 creatinine 2.22     . CVA (cerebral infarction)   . Cystic disease of liver   . Dementia with behavioral disturbance 06/06/2012  . Depression 06/06/2012  . Diverticulosis   . Edema   . Gait disorder   . GERD (gastroesophageal reflux disease)   . H/O hiatal hernia 2008   repaired  . Hearing loss   . Hearing loss   . Hypertension   . Hyponatremia   . Inguinal hernia   . Keratosis   . Paresthesia   . Parkinsonism ([place])   . PVC (premature ventricular contraction)   . Sciatica   . Sciatica   . Stroke [facility]) 2010   no deficits  . Thyroid disease   . Vitamin D deficiency disease     Patient Active Problem List   Diagnosis Date Noted  . UTI (urinary tract infection) 12/16/2015  . Anemia in chronic kidney disease 06/12/2015  . AK (actinic keratosis) 03/19/2014  . Sinusitis, chronic 01/08/2014  . Insomnia 06/08/2013  . CKD (chronic kidney disease) stage 3, GFR 30-59 ml/min 08/29/2012  . Depression 06/06/2012  . Dementia with behavioral disturbance 06/06/2012  . Constipation  06/06/2012  . BPH (benign prostatic hyperplasia) 06/06/2012  . Nephrolithiasis 10/25/2011  . Hyponatremia 10/24/2011  . Fall 04/19/2011  . Parkinsonism ([place])   . CVA (cerebral infarction)   . Hypertension   . Back pain   . Hearing loss   . Gait disorder   . Diverticulosis   . GERD (gastroesophageal reflux disease)     Past Surgical History:  Procedure Laterality Date  . APPENDECTOMY    . ESOPHAGOGASTRODUODENOSCOPY  10/25/2011   Procedure: ESOPHAGOGASTRODUODENOSCOPY (EGD);  Surgeon: Winfield Cunas., MD;  Location: [facility] ENDOSCOPY;  Service: Endoscopy;  Laterality: N/A;  . HERNIA REPAIR    . PROSTATE SURGERY     cancer       Home Medications    Prior to Admission medications   Medication Sig Start Date End Date Taking? Authorizing Provider  acetaminophen (TYLENOL) 325 MG tablet Take 650 mg by mouth every 4 (four) hours as needed.   Yes Historical Provider, MD  acetaminophen (TYLENOL) 500 MG tablet Take 500 mg by mouth at bedtime.    Yes Historical Provider, MD  bisacodyl (DULCOLAX) 10 MG suppository Place 10 mg rectally daily as needed for moderate constipation.   Yes Historical Provider, MD  Cholecalciferol (VITAMIN D3) 10000 units TABS Take 1 tablet by mouth daily.   Yes Historical Provider, MD  feeding supplement (ENSURE CLINICAL STRENGTH) LIQD Take 237 mLs by mouth daily as needed (nutrition). Take one can daily as needed  04/23/11  Yes Theodis Blaze, MD  finasteride (PROSCAR) 5 MG tablet Take 5 mg by mouth daily.   Yes Historical Provider, MD  fluticasone (FLONASE) 50 MCG/ACT nasal spray Place 1 spray into both nostrils daily.   Yes Historical Provider, MD  loteprednol (LOTEMAX) 0.5 % ophthalmic suspension Place 1 drop into the left eye daily.   Yes Historical Provider, MD  magnesium hydroxide (MILK OF MAGNESIA) 400 MG/5ML suspension Take 30 mLs by mouth daily as needed for mild constipation.  10/28/11  Yes Velvet Bathe, MD  Melatonin 3 MG TABS Take 3 mg by mouth at bedtime.     Yes Historical Provider, MD  Memantine HCl ER (NAMENDA XR) 28 MG CP24 Take 28 mg by mouth daily. Take one daily for memory   Yes Historical Provider, MD  Omega-3 Fatty Acids (FISH OIL) 1000 MG CAPS Take 2 capsules by mouth daily.   Yes Historical Provider, MD  Polyethyl Glycol-Propyl Glycol (SYSTANE) 0.4-0.3 % SOLN Place 1 drop into both eyes 3 (three) times daily.   Yes Historical Provider, MD  senna (SENOKOT) 8.6 MG tablet Take 2 tablets by mouth 2 (two) times daily.    Yes Historical Provider, MD  sodium chloride (MURO 128) 2 % ophthalmic solution Place 1 drop into the left eye at bedtime.   Yes Historical Provider, MD  sodium fluoride (PREVIDENT 5000 PLUS) 1.1 % CREA dental cream Place 1 application onto teeth every evening.   Yes Historical Provider, MD  Tamsulosin HCl (FLOMAX) 0.4 MG CAPS Take 0.4 mg by mouth every morning.    Yes Historical Provider, MD  venlafaxine (EFFEXOR) 75 MG tablet Take 75 mg by mouth daily.   Yes Historical Provider, MD    Family History Family History  Problem Relation Age of Onset  . Hypertension Mother     Social History Social History  Substance Use Topics  . Smoking status: Never Smoker  . Smokeless tobacco: Never Used  . Alcohol use 0.0 oz/week     Comment: occasionaly     Allergies   Olanzapine and Rivastigmine   Review of Systems Review of Systems  Unable to perform ROS: Dementia  Skin: Negative for rash.     Physical Exam Updated Vital Signs BP (!) 186/102   Pulse 68   Temp 97.6 F (36.4 C) (Oral)   Resp 18   SpO2 95%   Physical Exam  Constitutional: He appears well-developed.  HENT:  Head: Normocephalic.  Patient has an avulsion to his occipital area. Avulsion is approximately 2 cm x 2 cm  Eyes: Conjunctivae and EOM are normal. No scleral icterus.  Neck: Neck supple. No thyromegaly present.  Cardiovascular: Normal rate and regular rhythm.  Exam reveals no gallop and no friction rub.   No murmur  heard. Pulmonary/Chest: No stridor. He has no wheezes. He has no rales. He exhibits no tenderness.  Abdominal: He exhibits no distension. There is no tenderness. There is no rebound.  Musculoskeletal: Normal range of motion. He exhibits no edema.  Lymphadenopathy:    He has no cervical adenopathy.  Neurological: He is alert. He exhibits normal muscle tone. Coordination normal.  Patient alert oriented person only  Skin: No rash noted. No erythema.     ED Treatments / Results  Labs (all labs ordered are listed, but only abnormal results are displayed) Labs Reviewed - No data to  display  EKG  EKG Interpretation None       Radiology Ct Head Wo Contrast  Result Date: 01/11/2016 CLINICAL DATA:  Pt brought from [facility] via EMS for unwitnessed fall. Pt has hx dementia but is at baseline neuro status, PERRLA. Pt is not on blood thinners. Two inch gash to right forehead and skin tear to right upper arm EXAM: CT HEAD WITHOUT CONTRAST CT CERVICAL SPINE WITHOUT CONTRAST TECHNIQUE: Multidetector CT imaging of the head and cervical spine was performed following the standard protocol without intravenous contrast. Multiplanar CT image reconstructions of the cervical spine were also generated. COMPARISON:  None. FINDINGS: CT HEAD FINDINGS Brain: No intracranial hemorrhage. No parenchymal contusion. No midline shift or mass effect. Basilar cisterns are patent. No skull base fracture. No fluid in the paranasal sinuses or mastoid air cells. Orbits are normal. There is generalized cortical atrophy and proportional ventricular dilatation. Vascular: No hyperdense vessel or unexpected calcification. Skull: No fracture. Small scalp hematoma superficial to the RIGHT frontal bone. Sinuses/Orbits: No acute finding. Other: None. CT CERVICAL SPINE FINDINGS Alignment: Normal alignment of the vertebral bodies. No prevertebral soft tissue swelling. Skull base and vertebrae: Craniocervical junction is  intact. No acute loss of vertebral body height or disc height. Normal facet articulation. Soft tissues and spinal canal: No epidural or paraspinal hematoma. Disc levels: Multiple levels of endplate spurring and disc space narrowing. Multiple levels of uncovertebral and facet hypertrophy. Extensive calcification in the nuchal ligament posterior to the lower cervical spine elements Upper chest: Upper lungs are clear Other: None IMPRESSION: 1. No intracranial trauma. 2. Small scalp hematoma. 3. Extensive atrophy and mild white matter microvascular disease 4. No evidence cervical spine fracture. Patient is tilted [id number] which makes evaluation difficult. 5. Multilevel disc osteophytic disease and facet hypertrophy. Electronically Signed   By: Suzy Bouchard M.D.   On: 01/11/2016 21:45   Ct Cervical Spine Wo Contrast  Result Date: 01/11/2016 CLINICAL DATA:  Pt brought from [place] via EMS for unwitnessed fall. Pt has hx dementia but is at baseline neuro status, PERRLA. Pt is not on blood thinners. Two inch gash to right forehead and skin tear to right upper arm EXAM: CT HEAD WITHOUT CONTRAST CT CERVICAL SPINE WITHOUT CONTRAST TECHNIQUE: Multidetector CT imaging of the head and cervical spine was performed following the standard protocol without intravenous contrast. Multiplanar CT image reconstructions of the cervical spine were also generated. COMPARISON:  None. FINDINGS: CT HEAD FINDINGS Brain: No intracranial hemorrhage. No parenchymal contusion. No midline shift or mass effect. Basilar cisterns are patent. No skull base fracture. No fluid in the paranasal sinuses or mastoid air cells. Orbits are normal. There is generalized cortical atrophy and proportional ventricular dilatation. Vascular: No hyperdense vessel or unexpected calcification. Skull: No fracture. Small scalp hematoma superficial to the RIGHT frontal bone. Sinuses/Orbits: No acute finding. Other: None. CT CERVICAL SPINE  FINDINGS Alignment: Normal alignment of the vertebral bodies. No prevertebral soft tissue swelling. Skull base and vertebrae: Craniocervical junction is intact. No acute loss of vertebral body height or disc height. Normal facet articulation. Soft tissues and spinal canal: No epidural or paraspinal hematoma. Disc levels: Multiple levels of endplate spurring and disc space narrowing. Multiple levels of uncovertebral and facet hypertrophy. Extensive calcification in the nuchal ligament posterior to the lower cervical spine elements Upper chest: Upper lungs are clear Other: None IMPRESSION: 1. No intracranial trauma. 2. Small scalp hematoma. 3. Extensive atrophy and mild white matter microvascular disease 4.  No evidence cervical spine fracture. Patient is tilted [id number] which makes evaluation difficult. 5. Multilevel disc osteophytic disease and facet hypertrophy. Electronically Signed   By: Suzy Bouchard M.D.   On: 01/11/2016 21:45    Procedures Procedures (including critical care time)  Medications Ordered in ED Medications - No data to display   Initial Impression / Assessment and Plan / ED Course  I have reviewed the triage vital signs and the nursing notes.  Pertinent labs & imaging results that were available during my care of the patient were reviewed by me and considered in my medical decision making (see chart for details).  Clinical Course     CT head and neck are unremarkable. Patient with the avulsion. Area will be cleaned thoroughly and bandaged and he will have daily dressing changes  Final Clinical Impressions(s) / ED Diagnoses   Final diagnoses:  Fall, initial encounter    New Prescriptions New Prescriptions   No medications on file     Milton Ferguson, MD 01/11/16 2259

## 2016-01-11 NOTE — ED Triage Notes (Signed)
Pt brought from [facility] via EMS for unwitnessed fall.  Pt has hx dementia but is at baseline neuro status, PERRLA.  Pt is not on blood thinners.  Two inch gash to right forehead and skin tear to right upper back, bleeding controlled for both.  Staff reports that pt may have hit head on doorknob on the way down.  Pt in C-collar.

## 2016-01-11 NOTE — ED Notes (Signed)
Non-adhesive dressing placed on skin tear to upper right back

## 2016-01-11 NOTE — ED Notes (Signed)
Bed: [id number] Expected date:  Expected time:  Means of arrival:  Comments: 82yo M fall

## 2016-01-11 NOTE — ED Notes (Signed)
MD aware of elevated BP.

## 2016-01-12 ENCOUNTER — Encounter: Payer: Self-pay | Admitting: Internal Medicine

## 2016-01-12 ENCOUNTER — Non-Acute Institutional Stay (SKILLED_NURSING_FACILITY): Payer: Medicare Other | Admitting: Internal Medicine

## 2016-01-12 DIAGNOSIS — S0101XA Laceration without foreign body of scalp, initial encounter: Secondary | ICD-10-CM | POA: Diagnosis not present

## 2016-01-12 DIAGNOSIS — R269 Unspecified abnormalities of gait and mobility: Secondary | ICD-10-CM

## 2016-01-12 DIAGNOSIS — S0100XA Unspecified open wound of scalp, initial encounter: Secondary | ICD-10-CM | POA: Insufficient documentation

## 2016-01-12 DIAGNOSIS — N183 Chronic kidney disease, stage 3 unspecified: Secondary | ICD-10-CM

## 2016-01-12 DIAGNOSIS — W19XXXA Unspecified fall, initial encounter: Secondary | ICD-10-CM | POA: Diagnosis not present

## 2016-01-12 DIAGNOSIS — F0151 Vascular dementia with behavioral disturbance: Secondary | ICD-10-CM

## 2016-01-12 DIAGNOSIS — I10 Essential (primary) hypertension: Secondary | ICD-10-CM

## 2016-01-12 DIAGNOSIS — F01518 Vascular dementia, unspecified severity, with other behavioral disturbance: Secondary | ICD-10-CM

## 2016-01-12 NOTE — Progress Notes (Signed)
Progress Note    Location:  [place] Room Number: N04 Place of Service:  SNF [phone]) Provider:  Jeanmarie Hubert, MD  Patient Care Team: Estill Dooms, MD as PCP - General (Internal Medicine) Man Otho Darner, NP as Nurse Practitioner (Internal Medicine)  Extended Emergency Contact Information Primary Emergency Contact: Phillis Haggis [address] [place] Phone: [phone] Relation: Nephew  Code Status:  DNR Goals of care: Advanced Directive information Advanced Directives 01/12/2016  Does patient have an advance directive? Yes  Type of Par[employer] of [place];Living will  Does patient want to make changes to advanced directive? -  Copy of advanced directive(s) in chart? Yes  Pre-existing out of facility DNR order (yellow form or pink MOST form) Pink MOST form placed in chart (order not valid for inpatient use);Yellow form placed in chart (order not valid for inpatient use)     Chief Complaint  Patient presents with  . Acute Visit    patient fell hit head 01/11/16, went to ED avulsion to his occipital area.     HPI:  Pt is a 82 y.o. male seen today for an acute visit for follow u of a fall yesterday that caused injuries and a visit to the ER. There is a small laceration of the right parietal area. There are other small skin tears. Speech is a litte more slurred than usual. He is approrpiate in his responses to questions.   Past Medical History:  Diagnosis Date  . Anemia   . Anxiety   . Back pain   . Cataract   . Chronic kidney disease 2013   rhabdo  . CKD (chronic kidney disease) stage 3, GFR 30-59 ml/min 08/29/2012   Creatinine 1.88 07/05/13(baseline 1.5-2.0) 02/11/14 creatinine 2.00 05/16/14 creatinine 2.31 07/18/14 creatinine 2.22     . CVA (cerebral infarction)   . Cystic disease of liver   . Dementia with behavioral disturbance 06/06/2012  . Depression 06/06/2012  . Diverticulosis   . Edema   .  Gait disorder   . GERD (gastroesophageal reflux disease)   . H/O hiatal hernia 2008   repaired  . Hearing loss   . Hearing loss   . Hypertension   . Hyponatremia   . Inguinal hernia   . Keratosis   . Paresthesia   . Parkinsonism ([place])   . PVC (premature ventricular contraction)   . Sciatica   . Sciatica   . Stroke [facility]) 2010   no deficits  . Thyroid disease   . Vitamin D deficiency disease    Past Surgical History:  Procedure Laterality Date  . APPENDECTOMY    . ESOPHAGOGASTRODUODENOSCOPY  10/25/2011   Procedure: ESOPHAGOGASTRODUODENOSCOPY (EGD);  Surgeon: Winfield Cunas., MD;  Location: [facility] ENDOSCOPY;  Service: Endoscopy;  Laterality: N/A;  . HERNIA REPAIR    . PROSTATE SURGERY     cancer    Allergies  Allergen Reactions  . Olanzapine Other (See Comments)    unknown  . Rivastigmine Nausea Only      Medication List       Accurate as of 01/12/16 11:47 AM. Always use your most recent med list.          acetaminophen 500 MG tablet Commonly known as:  TYLENOL Take 500 mg by mouth at bedtime.   acetaminophen 325 MG tablet Commonly known as:  TYLENOL Take 650 mg by mouth every 4 (four) hours  as needed.   amLODipine 5 MG tablet Commonly known as:  NORVASC Take 5 mg by mouth. Take one tablet daily for blood pressure   bisacodyl 10 MG suppository Commonly known as:  DULCOLAX Place 10 mg rectally daily as needed for moderate constipation.   feeding supplement Liqd Take 237 mLs by mouth daily as needed (nutrition). Take one can daily as needed   finasteride 5 MG tablet Commonly known as:  PROSCAR Take 5 mg by mouth daily.   Fish Oil 1000 MG Caps Take 2 capsules by mouth daily.   fluticasone 50 MCG/ACT nasal spray Commonly known as:  FLONASE Place 1 spray into both nostrils daily.   loteprednol 0.5 % ophthalmic suspension Commonly known as:  LOTEMAX Place 1 drop into the left eye daily.   magnesium hydroxide 400 MG/5ML suspension Commonly known  as:  MILK OF MAGNESIA Take 30 mLs by mouth daily as needed for mild constipation.   Melatonin 3 MG Tabs Take 3 mg by mouth at bedtime.   mirtazapine 7.5 MG tablet Commonly known as:  REMERON Take 7.5 mg by mouth. Take one tablet at bedtime   NAMENDA XR 28 MG Cp24 24 hr capsule Generic drug:  memantine Take 28 mg by mouth daily. Take one daily for memory   PREVIDENT 5000 PLUS 1.1 % Crea dental cream Generic drug:  sodium fluoride Place 1 application onto teeth every evening.   senna 8.6 MG tablet Commonly known as:  SENOKOT Take 2 tablets by mouth. Take 2 tablets once daily   sodium chloride 5 % ophthalmic ointment Commonly known as:  MURO 0000000 Place 1 application into the left eye. Apply to left eye at bedtime   sodium chloride 2 % ophthalmic solution Commonly known as:  MURO 128 Place 1 drop into the left eye. One drop in left eye three times a day   SYSTANE 0.4-0.3 % Soln Generic drug:  Polyethyl Glycol-Propyl Glycol Place 1 drop into both eyes 3 (three) times daily.   tamsulosin 0.4 MG Caps capsule Commonly known as:  FLOMAX Take 0.4 mg by mouth every morning.   venlafaxine 75 MG tablet Commonly known as:  EFFEXOR Take 75 mg by mouth daily.   Vitamin D3 10000 units Tabs Take 1 tablet by mouth daily.       Review of Systems  Constitutional: Negative for chills, diaphoresis and fever.  HENT: Positive for congestion, hearing loss, sinus pressure and voice change (hoarse). Negative for ear discharge, ear pain, nosebleeds, postnasal drip, rhinorrhea, sore throat and tinnitus.        Nasal congestion at night  Eyes: Negative for photophobia, pain, discharge and redness.  Respiratory: Negative for cough, shortness of breath, wheezing and stridor.   Cardiovascular: Negative for chest pain, palpitations and leg swelling.  Gastrointestinal: Negative for abdominal pain, blood in stool, constipation, diarrhea, nausea and vomiting.  Endocrine: Negative for polydipsia.    Genitourinary: Positive for frequency. Negative for dysuria, flank pain, hematuria and urgency.  Musculoskeletal: Positive for back pain and gait problem. Negative for myalgias and neck pain.  Skin: Positive for wound (scalp laceration and skin tears). Negative for rash.  Allergic/Immunologic: Negative for environmental allergies.  Neurological: Negative for dizziness, tremors, seizures, weakness and headaches.  Hematological: Does not bruise/bleed easily.  Psychiatric/Behavioral: Negative for hallucinations and suicidal ideas. The patient is not nervous/anxious.     Immunization History  Administered Date(s) Administered  . DTaP 10/31/2011  . Influenza Whole 12/20/2012  . Influenza-Unspecified 12/06/2013, 12/06/2014, 12/11/2015  .  Pneumococcal-Unspecified 10/31/2011  . Tdap 10/24/2011   Pertinent  Health Maintenance Due  Topic Date Due  . PNA vac Low Risk Adult (2 of 2 - PCV13) 10/30/2012  . INFLUENZA VACCINE  Completed   Fall Risk  01/12/2016 09/12/2014 05/23/2014  Falls in the past year? Yes Yes No  Number falls in past yr: 1 1 -  Injury with Fall? Yes No -  Risk for fall due to : - - History of fall(s);Impaired balance/gait    Vitals:   01/12/16 1125  BP: (!) 174/86  Pulse: 85  Resp: 18  Temp: (!) 96.7 F (35.9 C)  SpO2: 98%  Weight: 143 lb (64.9 kg)  Height: 5\' 3"  (1.6 m)   Body mass index is 25.33 kg/m. Physical Exam  Constitutional: He appears well-developed and well-nourished.  HENT:  Head: Normocephalic and atraumatic. Not macrocephalic.  Nose: No mucosal edema or rhinorrhea. Right sinus exhibits no maxillary sinus tenderness and no frontal sinus tenderness. Left sinus exhibits no maxillary sinus tenderness and no frontal sinus tenderness.  Mouth/Throat: Uvula is midline. Posterior oropharyngeal erythema present. No oropharyngeal exudate or posterior oropharyngeal edema.  Eyes: EOM are normal. Pupils are equal, round, and reactive to light.  Neck: Normal  range of motion. Neck supple. No JVD present. No thyromegaly present.  Cardiovascular: Normal rate and regular rhythm.   Murmur heard.  Systolic murmur is present with a grade of 2/6  Pulmonary/Chest: Effort normal and breath sounds normal. He has no wheezes. He has no rales.  Abdominal: Soft. Bowel sounds are normal. There is no tenderness.  Musculoskeletal: Normal range of motion. He exhibits tenderness. He exhibits no edema.  Scoliokyphosis, walking with walker with SBA. Chronic back pain. 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  Lymphadenopathy:    He has no cervical adenopathy.  Neurological: He is alert. He displays normal reflexes. No cranial nerve deficit. He exhibits normal muscle tone. Coordination normal.  Mumbling and incoherent answers sometimes.  Skin: Skin is warm and dry. No rash noted. No erythema.  AK top of head. Scalp laceration and skin tears.  Psychiatric: His mood appears not anxious. His affect is not angry, not blunt and not labile. His speech is not delayed and not slurred. He is not agitated, not aggressive, not slowed, not withdrawn, not actively hallucinating and not combative. Thought content is not paranoid and not delusional. Cognition and memory are impaired. He expresses impulsivity. He does not exhibit a depressed mood. He exhibits abnormal recent memory. He is attentive.    Labs reviewed:  Recent Labs  12/09/15 12/11/15 12/15/15  NA 125* 129* 133*  K 3.8 3.5 3.7  BUN 25* 23* 33*  CREATININE 2.4* 2.3* 2.7*    Recent Labs  06/12/15 12/06/15 12/08/15  AST 12* 18 15  ALT 8* 11 11  ALKPHOS 76 67 77    Recent Labs  06/12/15 12/06/15 12/08/15  WBC 9.1 5.9 7.6  HGB 10.1* 10.1* 10.0*  HCT 31* 30* 29*  PLT 204 216 214   Lab Results  Component Value Date   TSH 1.09 06/12/2015   Lab Results  Component Value Date   HGBA1C 5.3 06/12/2015   No results found for: CHOL, HDL, LDLCALC, LDLDIRECT, TRIG, CHOLHDL  Significant Diagnostic  Results in last 30 days:  Ct Head Wo Contrast  Result Date: 01/11/2016 CLINICAL DATA:  Pt brought from [place] via EMS for unwitnessed fall. Pt has hx dementia but is at baseline neuro status, PERRLA.  Pt is not on blood thinners. Two inch gash to right forehead and skin tear to right upper arm EXAM: CT HEAD WITHOUT CONTRAST CT CERVICAL SPINE WITHOUT CONTRAST TECHNIQUE: Multidetector CT imaging of the head and cervical spine was performed following the standard protocol without intravenous contrast. Multiplanar CT image reconstructions of the cervical spine were also generated. COMPARISON:  None. FINDINGS: CT HEAD FINDINGS Brain: No intracranial hemorrhage. No parenchymal contusion. No midline shift or mass effect. Basilar cisterns are patent. No skull base fracture. No fluid in the paranasal sinuses or mastoid air cells. Orbits are normal. There is generalized cortical atrophy and proportional ventricular dilatation. Vascular: No hyperdense vessel or unexpected calcification. Skull: No fracture. Small scalp hematoma superficial to the RIGHT frontal bone. Sinuses/Orbits: No acute finding. Other: None. CT CERVICAL SPINE FINDINGS Alignment: Normal alignment of the vertebral bodies. No prevertebral soft tissue swelling. Skull base and vertebrae: Craniocervical junction is intact. No acute loss of vertebral body height or disc height. Normal facet articulation. Soft tissues and spinal canal: No epidural or paraspinal hematoma. Disc levels: Multiple levels of endplate spurring and disc space narrowing. Multiple levels of uncovertebral and facet hypertrophy. Extensive calcification in the nuchal ligament posterior to the lower cervical spine elements Upper chest: Upper lungs are clear Other: None IMPRESSION: 1. No intracranial trauma. 2. Small scalp hematoma. 3. Extensive atrophy and mild white matter microvascular disease 4. No evidence cervical spine fracture. Patient is tilted [id number] which  makes evaluation difficult. 5. Multilevel disc osteophytic disease and facet hypertrophy. Electronically Signed   By: Suzy Bouchard M.D.   On: 01/11/2016 21:45   Ct Cervical Spine Wo Contrast  Result Date: 01/11/2016 CLINICAL DATA:  Pt brought from [place] via EMS for unwitnessed fall. Pt has hx dementia but is at baseline neuro status, PERRLA. Pt is not on blood thinners. Two inch gash to right forehead and skin tear to right upper arm EXAM: CT HEAD WITHOUT CONTRAST CT CERVICAL SPINE WITHOUT CONTRAST TECHNIQUE: Multidetector CT imaging of the head and cervical spine was performed following the standard protocol without intravenous contrast. Multiplanar CT image reconstructions of the cervical spine were also generated. COMPARISON:  None. FINDINGS: CT HEAD FINDINGS Brain: No intracranial hemorrhage. No parenchymal contusion. No midline shift or mass effect. Basilar cisterns are patent. No skull base fracture. No fluid in the paranasal sinuses or mastoid air cells. Orbits are normal. There is generalized cortical atrophy and proportional ventricular dilatation. Vascular: No hyperdense vessel or unexpected calcification. Skull: No fracture. Small scalp hematoma superficial to the RIGHT frontal bone. Sinuses/Orbits: No acute finding. Other: None. CT CERVICAL SPINE FINDINGS Alignment: Normal alignment of the vertebral bodies. No prevertebral soft tissue swelling. Skull base and vertebrae: Craniocervical junction is intact. No acute loss of vertebral body height or disc height. Normal facet articulation. Soft tissues and spinal canal: No epidural or paraspinal hematoma. Disc levels: Multiple levels of endplate spurring and disc space narrowing. Multiple levels of uncovertebral and facet hypertrophy. Extensive calcification in the nuchal ligament posterior to the lower cervical spine elements Upper chest: Upper lungs are clear Other: None IMPRESSION: 1. No intracranial trauma. 2. Small scalp  hematoma. 3. Extensive atrophy and mild white matter microvascular disease 4. No evidence cervical spine fracture. Patient is tilted [id number] which makes evaluation difficult. 5. Multilevel disc osteophytic disease and facet hypertrophy. Electronically Signed   By: Suzy Bouchard M.D.   On: 01/11/2016 21:45    Assessment/Plan 1. Laceration of scalp without foreign body, initial encounter  Continue to check daily by nursing and change bandages.  2. Gait disorder Chronic and unchanged. High risk for falls.  3. Fall, initial encounter Despite his high risk for falls, he only had one in 2016 and one in 2017.  4. Essential hypertension Elevation in SBP today, but other recent BP have been normal. Elevation is likely relate to recent trauma. Will monitor without change in medications yet.  5. Vascular dementia with behavior disturbance No change  6. CKD (chronic kidney disease) stage 3, GFR 30-59 ml/min Mild rise in the BUN and creat in recent lab.

## 2016-02-10 ENCOUNTER — Encounter: Payer: Self-pay | Admitting: Nurse Practitioner

## 2016-02-10 ENCOUNTER — Non-Acute Institutional Stay (SKILLED_NURSING_FACILITY): Payer: Medicare Other | Admitting: Nurse Practitioner

## 2016-02-10 DIAGNOSIS — R634 Abnormal weight loss: Secondary | ICD-10-CM | POA: Diagnosis not present

## 2016-02-10 DIAGNOSIS — K5900 Constipation, unspecified: Secondary | ICD-10-CM | POA: Diagnosis not present

## 2016-02-10 DIAGNOSIS — E871 Hypo-osmolality and hyponatremia: Secondary | ICD-10-CM

## 2016-02-10 DIAGNOSIS — F0151 Vascular dementia with behavioral disturbance: Secondary | ICD-10-CM

## 2016-02-10 DIAGNOSIS — K219 Gastro-esophageal reflux disease without esophagitis: Secondary | ICD-10-CM

## 2016-02-10 DIAGNOSIS — D631 Anemia in chronic kidney disease: Secondary | ICD-10-CM

## 2016-02-10 DIAGNOSIS — N400 Enlarged prostate without lower urinary tract symptoms: Secondary | ICD-10-CM

## 2016-02-10 DIAGNOSIS — N183 Chronic kidney disease, stage 3 unspecified: Secondary | ICD-10-CM

## 2016-02-10 DIAGNOSIS — I10 Essential (primary) hypertension: Secondary | ICD-10-CM | POA: Diagnosis not present

## 2016-02-10 DIAGNOSIS — G20 Parkinson's disease: Secondary | ICD-10-CM | POA: Diagnosis not present

## 2016-02-10 DIAGNOSIS — F01518 Vascular dementia, unspecified severity, with other behavioral disturbance: Secondary | ICD-10-CM

## 2016-02-10 DIAGNOSIS — F320 Major depressive disorder, single episode, mild: Secondary | ICD-10-CM

## 2016-02-10 DIAGNOSIS — F5101 Primary insomnia: Secondary | ICD-10-CM

## 2016-02-10 NOTE — Assessment & Plan Note (Signed)
09/2015 #152 Ibs, 10/2015 # 150Ibs, 11/2015 # 143Ibs, 12/2015 # 137 Ibs, the patient denied abd pain, indigestion, constipation, or diarrhea, off Remeron since 10/2015, restarted 11/2015, consumes 75-100% meals. Will update CBC CMP TSH Hgb a1c.

## 2016-02-10 NOTE — Assessment & Plan Note (Signed)
Functioning well in SNF, stays in his room most of time, ambulates with walker, continue Namenda 

## 2016-02-10 NOTE — Assessment & Plan Note (Signed)
Mood is stable, continue Effexor 75mg  and resume Mirtazapine 7.5mg  qhs due to relapsed insomnia(off it since 11/18/15).  Sleeps well at night, 06/12/15 TSH 1.09. Hgb a1c 5.3

## 2016-02-10 NOTE — Assessment & Plan Note (Signed)
Stable, off Omeprazole, Zantac.  

## 2016-02-10 NOTE — Assessment & Plan Note (Signed)
Last urinary retention, Foley inserted 11/04/12 at ED-successfully removed--continued Tamsulosin and Finasteride 

## 2016-02-10 NOTE — Assessment & Plan Note (Signed)
12/08/15 Hgb 10.1

## 2016-02-10 NOTE — Assessment & Plan Note (Signed)
Not disabling, not on meds.  

## 2016-02-10 NOTE — Assessment & Plan Note (Signed)
Stable, continue Senokot S II bid and MOM q 3days, Doculax suppository prn.

## 2016-02-10 NOTE — Assessment & Plan Note (Signed)
Sleeps well, continue Effexor and Mirtazapine.  

## 2016-02-10 NOTE — Assessment & Plan Note (Signed)
Controlled, continue Amlodipine 5mg  

## 2016-02-10 NOTE — Progress Notes (Signed)
Location:  [facility] Room Number: 4 Place of Service:  SNF (31) Provider:  Linnea Todisco, Manxie  NP  Jeanmarie Hubert, MD  Patient Care Team: Estill Dooms, MD as PCP - General (Internal Medicine) Ymani Porcher Otho Darner, NP as Nurse Practitioner (Internal Medicine)  Extended Emergency Contact Information Primary Emergency Contact: Phillis Haggis [address] [address] of [place] Phone: [phone] Relation: Nephew  Code Status:  DNR Goals of care: Advanced Directive information Advanced Directives 02/10/2016  Does Patient Have a Medical Advance Directive? Yes  Type of Paramedic of [place];Living will  Does patient want to make changes to medical advance directive? No - Patient declined  Copy of [place] in Chart? Yes  Pre-existing out of facility DNR order (yellow form or pink MOST form) -     Chief Complaint  Patient presents with  . Acute Visit    HPI:  Pt is a 82 y.o. male seen today for an acute visit for loss of weight, 09/2015 #152 Ibs, 10/2015 # 150Ibs, 11/2015 # 143Ibs, 12/2015 # 137 Ibs, the patient denied abd pain, indigestion, constipation, or diarrhea, off Remeron since 10/2015, restarted 11/2015, consumes 75-100% meals, presently takes Effexor and Mirtazapine.   Hx of HTN controlled while on Amlodipine 5mg , GERD is managed on Zantac, off Omeprazole, dementia has been stabilized on Namenda, resides in SNF for care needs. Hx of chronic kidney disease with baseline creat 2s. He is forgetful, but able to voice his needs while on Namenda Hx of BPH with recurrent urinary retention is the past, taking Tamsulosin and Finasteride.    Past Medical History:  Diagnosis Date  . Anemia   . Anxiety   . Back pain   . Cataract   . Chronic kidney disease 2013   rhabdo  . CKD (chronic kidney disease) stage 3, GFR 30-59 ml/min 08/29/2012   Creatinine 1.88 07/05/13(baseline 1.5-2.0) 02/11/14 creatinine 2.00 05/16/14  creatinine 2.31 07/18/14 creatinine 2.22     . CVA (cerebral infarction)   . Cystic disease of liver   . Dementia with behavioral disturbance 06/06/2012  . Depression 06/06/2012  . Diverticulosis   . Edema   . Gait disorder   . GERD (gastroesophageal reflux disease)   . H/O hiatal hernia 2008   repaired  . Hearing loss   . Hearing loss   . Hypertension   . Hyponatremia   . Inguinal hernia   . Keratosis   . Paresthesia   . Parkinsonism ([place])   . PVC (premature ventricular contraction)   . Sciatica   . Sciatica   . Stroke [facility]) 2010   no deficits  . Thyroid disease   . Vitamin D deficiency disease    Past Surgical History:  Procedure Laterality Date  . APPENDECTOMY    . ESOPHAGOGASTRODUODENOSCOPY  10/25/2011   Procedure: ESOPHAGOGASTRODUODENOSCOPY (EGD);  Surgeon: Winfield Cunas., MD;  Location: [facility] ENDOSCOPY;  Service: Endoscopy;  Laterality: N/A;  . HERNIA REPAIR    . PROSTATE SURGERY     cancer    Allergies  Allergen Reactions  . Olanzapine Other (See Comments)    unknown  . Rivastigmine Nausea Only      Medication List       Accurate as of 02/10/16  4:57 PM. Always use your most recent med list.          acetaminophen 500 MG tablet Commonly known as:  TYLENOL Take  500 mg by mouth at bedtime.   acetaminophen 325 MG tablet Commonly known as:  TYLENOL Take 650 mg by mouth every 4 (four) hours as needed.   amLODipine 5 MG tablet Commonly known as:  NORVASC Take 5 mg by mouth. Take one tablet daily for blood pressure   bisacodyl 10 MG suppository Commonly known as:  DULCOLAX Place 10 mg rectally daily as needed for moderate constipation.   feeding supplement Liqd Take 237 mLs by mouth daily as needed (nutrition). Take one can daily as needed   finasteride 5 MG tablet Commonly known as:  PROSCAR Take 5 mg by mouth daily.   Fish Oil 1000 MG Caps Take 2 capsules by mouth daily.   fluticasone 50 MCG/ACT nasal spray Commonly known as:   FLONASE Place 1 spray into both nostrils daily.   loteprednol 0.5 % ophthalmic suspension Commonly known as:  LOTEMAX Place 1 drop into the left eye daily.   magnesium hydroxide 400 MG/5ML suspension Commonly known as:  MILK OF MAGNESIA Take 30 mLs by mouth daily as needed for mild constipation.   Melatonin 3 MG Tabs Take 3 mg by mouth at bedtime.   mirtazapine 7.5 MG tablet Commonly known as:  REMERON Take 7.5 mg by mouth. Take one tablet at bedtime   NAMENDA XR 28 MG Cp24 24 hr capsule Generic drug:  memantine Take 28 mg by mouth daily. Take one daily for memory   PREVIDENT 5000 PLUS 1.1 % Crea dental cream Generic drug:  sodium fluoride Place 1 application onto teeth every evening.   senna 8.6 MG tablet Commonly known as:  SENOKOT Take 2 tablets by mouth. Take 2 tablets once daily   sodium chloride 5 % ophthalmic ointment Commonly known as:  MURO 0000000 Place 1 application into the left eye. Apply to left eye at bedtime   sodium chloride 2 % ophthalmic solution Commonly known as:  MURO 128 Place 1 drop into the left eye. One drop in left eye three times a day   SYSTANE 0.4-0.3 % Soln Generic drug:  Polyethyl Glycol-Propyl Glycol Place 1 drop into both eyes 3 (three) times daily.   tamsulosin 0.4 MG Caps capsule Commonly known as:  FLOMAX Take 0.4 mg by mouth every morning.   venlafaxine 75 MG tablet Commonly known as:  EFFEXOR Take 75 mg by mouth daily.   Vitamin D3 10000 units Tabs Take 1 tablet by mouth daily.       Review of Systems  Constitutional: Positive for unexpected weight change. Negative for chills, diaphoresis and fever.  HENT: Positive for congestion, hearing loss, sinus pressure and voice change (hoarse). Negative for ear discharge, ear pain, nosebleeds, postnasal drip, rhinorrhea, sore throat and tinnitus.        Nasal congestion at night  Eyes: Negative for photophobia, pain, discharge and redness.  Respiratory: Negative for cough,  shortness of breath, wheezing and stridor.   Cardiovascular: Negative for chest pain, palpitations and leg swelling.  Gastrointestinal: Negative for abdominal pain, blood in stool, constipation, diarrhea, nausea and vomiting.  Endocrine: Negative for polydipsia.  Genitourinary: Positive for frequency. Negative for dysuria, flank pain, hematuria and urgency.  Musculoskeletal: Positive for back pain and gait problem. Negative for myalgias and neck pain.  Skin: Negative for rash.  Allergic/Immunologic: Negative for environmental allergies.  Neurological: Negative for dizziness, tremors, seizures, weakness and headaches.  Hematological: Does not bruise/bleed easily.  Psychiatric/Behavioral: Positive for sleep disturbance. Negative for hallucinations and suicidal ideas. The patient is not nervous/anxious.  Immunization History  Administered Date(s) Administered  . DTaP 10/31/2011  . Influenza Whole 12/20/2012  . Influenza-Unspecified 12/06/2013, 12/06/2014, 12/11/2015  . Pneumococcal-Unspecified 10/31/2011  . Tdap 10/24/2011   Pertinent  Health Maintenance Due  Topic Date Due  . PNA vac Low Risk Adult (2 of 2 - PCV13) 10/30/2012  . INFLUENZA VACCINE  Completed   Fall Risk  01/12/2016 09/12/2014 05/23/2014  Falls in the past year? Yes Yes No  Number falls in past yr: 1 1 -  Injury with Fall? Yes No -  Risk for fall due to : - - History of fall(s);Impaired balance/gait   Functional Status Survey:    Vitals:   02/10/16 1625  BP: 132/74  Pulse: 68  Resp: 18  Temp: 97.4 F (36.3 C)  SpO2: 95%  Weight: 136 lb (61.7 kg)  Height: 5\' 3"  (1.6 m)   Body mass index is 24.09 kg/m. Physical Exam  Constitutional: He appears well-developed and well-nourished.  HENT:  Head: Normocephalic and atraumatic. Not macrocephalic.  Nose: No mucosal edema or rhinorrhea. Right sinus exhibits no maxillary sinus tenderness and no frontal sinus tenderness. Left sinus exhibits no maxillary sinus  tenderness and no frontal sinus tenderness.  Mouth/Throat: Uvula is midline. Posterior oropharyngeal erythema present. No oropharyngeal exudate or posterior oropharyngeal edema.  Eyes: EOM are normal. Pupils are equal, round, and reactive to light.  Neck: Normal range of motion. Neck supple. No JVD present. No thyromegaly present.  Cardiovascular: Normal rate and regular rhythm.   Murmur heard.  Systolic murmur is present with a grade of 2/6  Pulmonary/Chest: Effort normal and breath sounds normal. He has no wheezes. He has no rales.  Abdominal: Soft. Bowel sounds are normal. There is no tenderness.  Musculoskeletal: Normal range of motion. He exhibits tenderness. He exhibits no edema.  Scoliokyphosis, walking with walker with SBA. Chronic back pain. 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  Lymphadenopathy:    He has no cervical adenopathy.  Neurological: He is alert. He displays normal reflexes. No cranial nerve deficit. He exhibits normal muscle tone. Coordination normal.  Mumbling and incoherent answers sometimes.  Skin: Skin is warm and dry. No rash noted. No erythema.  AK top of head  Psychiatric: His mood appears not anxious. His affect is not angry, not blunt and not labile. His speech is not delayed and not slurred. He is not agitated, not aggressive, not slowed, not withdrawn, not actively hallucinating and not combative. Thought content is not paranoid and not delusional. Cognition and memory are impaired. He expresses impulsivity. He does not exhibit a depressed mood. He exhibits abnormal recent memory. He is attentive.    Labs reviewed:  Recent Labs  12/09/15 12/11/15 12/15/15  NA 125* 129* 133*  K 3.8 3.5 3.7  BUN 25* 23* 33*  CREATININE 2.4* 2.3* 2.7*    Recent Labs  06/12/15 12/06/15 12/08/15  AST 12* 18 15  ALT 8* 11 11  ALKPHOS 76 67 77    Recent Labs  06/12/15 12/06/15 12/08/15  WBC 9.1 5.9 7.6  HGB 10.1* 10.1* 10.0*  HCT 31* 30* 29*  PLT 204 216  214   Lab Results  Component Value Date   TSH 1.09 06/12/2015   Lab Results  Component Value Date   HGBA1C 5.3 06/12/2015   No results found for: CHOL, HDL, LDLCALC, LDLDIRECT, TRIG, CHOLHDL  Significant Diagnostic Results in last 30 days:  Ct Head Wo Contrast  Result Date: 01/11/2016 CLINICAL DATA:  Pt brought  from [place] via EMS for unwitnessed fall. Pt has hx dementia but is at baseline neuro status, PERRLA. Pt is not on blood thinners. Two inch gash to right forehead and skin tear to right upper arm EXAM: CT HEAD WITHOUT CONTRAST CT CERVICAL SPINE WITHOUT CONTRAST TECHNIQUE: Multidetector CT imaging of the head and cervical spine was performed following the standard protocol without intravenous contrast. Multiplanar CT image reconstructions of the cervical spine were also generated. COMPARISON:  None. FINDINGS: CT HEAD FINDINGS Brain: No intracranial hemorrhage. No parenchymal contusion. No midline shift or mass effect. Basilar cisterns are patent. No skull base fracture. No fluid in the paranasal sinuses or mastoid air cells. Orbits are normal. There is generalized cortical atrophy and proportional ventricular dilatation. Vascular: No hyperdense vessel or unexpected calcification. Skull: No fracture. Small scalp hematoma superficial to the RIGHT frontal bone. Sinuses/Orbits: No acute finding. Other: None. CT CERVICAL SPINE FINDINGS Alignment: Normal alignment of the vertebral bodies. No prevertebral soft tissue swelling. Skull base and vertebrae: Craniocervical junction is intact. No acute loss of vertebral body height or disc height. Normal facet articulation. Soft tissues and spinal canal: No epidural or paraspinal hematoma. Disc levels: Multiple levels of endplate spurring and disc space narrowing. Multiple levels of uncovertebral and facet hypertrophy. Extensive calcification in the nuchal ligament posterior to the lower cervical spine elements Upper chest: Upper lungs  are clear Other: None IMPRESSION: 1. No intracranial trauma. 2. Small scalp hematoma. 3. Extensive atrophy and mild white matter microvascular disease 4. No evidence cervical spine fracture. Patient is tilted [id number] which makes evaluation difficult. 5. Multilevel disc osteophytic disease and facet hypertrophy. Electronically Signed   By: Suzy Bouchard M.D.   On: 01/11/2016 21:45   Ct Cervical Spine Wo Contrast  Result Date: 01/11/2016 CLINICAL DATA:  Pt brought from [place] via EMS for unwitnessed fall. Pt has hx dementia but is at baseline neuro status, PERRLA. Pt is not on blood thinners. Two inch gash to right forehead and skin tear to right upper arm EXAM: CT HEAD WITHOUT CONTRAST CT CERVICAL SPINE WITHOUT CONTRAST TECHNIQUE: Multidetector CT imaging of the head and cervical spine was performed following the standard protocol without intravenous contrast. Multiplanar CT image reconstructions of the cervical spine were also generated. COMPARISON:  None. FINDINGS: CT HEAD FINDINGS Brain: No intracranial hemorrhage. No parenchymal contusion. No midline shift or mass effect. Basilar cisterns are patent. No skull base fracture. No fluid in the paranasal sinuses or mastoid air cells. Orbits are normal. There is generalized cortical atrophy and proportional ventricular dilatation. Vascular: No hyperdense vessel or unexpected calcification. Skull: No fracture. Small scalp hematoma superficial to the RIGHT frontal bone. Sinuses/Orbits: No acute finding. Other: None. CT CERVICAL SPINE FINDINGS Alignment: Normal alignment of the vertebral bodies. No prevertebral soft tissue swelling. Skull base and vertebrae: Craniocervical junction is intact. No acute loss of vertebral body height or disc height. Normal facet articulation. Soft tissues and spinal canal: No epidural or paraspinal hematoma. Disc levels: Multiple levels of endplate spurring and disc space narrowing. Multiple levels of  uncovertebral and facet hypertrophy. Extensive calcification in the nuchal ligament posterior to the lower cervical spine elements Upper chest: Upper lungs are clear Other: None IMPRESSION: 1. No intracranial trauma. 2. Small scalp hematoma. 3. Extensive atrophy and mild white matter microvascular disease 4. No evidence cervical spine fracture. Patient is tilted [id number] which makes evaluation difficult. 5. Multilevel disc osteophytic disease and facet hypertrophy. Electronically Signed   By: Nicole Kindred  Leonia Reeves M.D.   On: 01/11/2016 21:45    Assessment/Plan Loss of weight 09/2015 #152 Ibs, 10/2015 # 150Ibs, 11/2015 # 143Ibs, 12/2015 # 137 Ibs, the patient denied abd pain, indigestion, constipation, or diarrhea, off Remeron since 10/2015, restarted 11/2015, consumes 75-100% meals. Will update CBC CMP TSH Hgb a1c.   Hypertension Controlled, continue Amlodipine 5mg   GERD (gastroesophageal reflux disease) Stable, off Omeprazole, Zantac.      Constipation Stable, continue Senokot S II bid and MOM q 3days, Doculax suppository prn.       Dementia with behavioral disturbance Functioning well in SNF, stays in his room most of time, ambulates with walker, continue Namenda    Parkinsonism Not disabling, not on meds  CKD (chronic kidney disease) stage 3, GFR 30-59 ml/min 12/11/15 Na 129, K 3.5, Bun 23, creat 2.27   BPH (benign prostatic hyperplasia) Last urinary retention, Foley inserted 11/04/12 at ED-successfully removed--continued Tamsulosin and Finasteride    Anemia in chronic kidney disease 12/08/15 Hgb 10.1  Depression Mood is stable, continue Effexor 75mg  and resume Mirtazapine 7.5mg  qhs due to relapsed insomnia(off it since 11/18/15).  Sleeps well at night, 06/12/15 TSH 1.09. Hgb a1c 5.3   Insomnia Sleeps well, continue Effexor and Mirtazapine.   Hyponatremia 12/15/15 Na 133    Family/ staff Communication: continue SNF for care assistance.   Labs/tests ordered: CBC CMP  TSH Hgb a1c

## 2016-02-10 NOTE — Assessment & Plan Note (Signed)
12/15/15 Na 133

## 2016-02-10 NOTE — Assessment & Plan Note (Signed)
12/11/15 Na 129, K 3.5, Bun 23, creat 2.27

## 2016-02-12 DIAGNOSIS — N200 Calculus of kidney: Secondary | ICD-10-CM | POA: Diagnosis not present

## 2016-02-12 DIAGNOSIS — I10 Essential (primary) hypertension: Secondary | ICD-10-CM | POA: Diagnosis not present

## 2016-02-12 DIAGNOSIS — R634 Abnormal weight loss: Secondary | ICD-10-CM | POA: Diagnosis not present

## 2016-02-12 DIAGNOSIS — D649 Anemia, unspecified: Secondary | ICD-10-CM | POA: Diagnosis not present

## 2016-02-12 DIAGNOSIS — F329 Major depressive disorder, single episode, unspecified: Secondary | ICD-10-CM | POA: Diagnosis not present

## 2016-02-12 DIAGNOSIS — E079 Disorder of thyroid, unspecified: Secondary | ICD-10-CM | POA: Diagnosis not present

## 2016-03-12 ENCOUNTER — Encounter: Payer: Self-pay | Admitting: Nurse Practitioner

## 2016-03-12 ENCOUNTER — Non-Acute Institutional Stay (SKILLED_NURSING_FACILITY): Payer: Medicare Other | Admitting: Nurse Practitioner

## 2016-03-12 DIAGNOSIS — N183 Chronic kidney disease, stage 3 unspecified: Secondary | ICD-10-CM

## 2016-03-12 DIAGNOSIS — K219 Gastro-esophageal reflux disease without esophagitis: Secondary | ICD-10-CM | POA: Diagnosis not present

## 2016-03-12 DIAGNOSIS — K5900 Constipation, unspecified: Secondary | ICD-10-CM

## 2016-03-12 DIAGNOSIS — D631 Anemia in chronic kidney disease: Secondary | ICD-10-CM

## 2016-03-12 DIAGNOSIS — F320 Major depressive disorder, single episode, mild: Secondary | ICD-10-CM

## 2016-03-12 DIAGNOSIS — F5101 Primary insomnia: Secondary | ICD-10-CM | POA: Diagnosis not present

## 2016-03-12 DIAGNOSIS — R634 Abnormal weight loss: Secondary | ICD-10-CM

## 2016-03-12 DIAGNOSIS — I10 Essential (primary) hypertension: Secondary | ICD-10-CM

## 2016-03-12 DIAGNOSIS — F0151 Vascular dementia with behavioral disturbance: Secondary | ICD-10-CM

## 2016-03-12 DIAGNOSIS — E871 Hypo-osmolality and hyponatremia: Secondary | ICD-10-CM | POA: Diagnosis not present

## 2016-03-12 DIAGNOSIS — F01518 Vascular dementia, unspecified severity, with other behavioral disturbance: Secondary | ICD-10-CM

## 2016-03-12 DIAGNOSIS — N400 Enlarged prostate without lower urinary tract symptoms: Secondary | ICD-10-CM | POA: Diagnosis not present

## 2016-03-12 NOTE — Assessment & Plan Note (Signed)
12/11/15 Na 129, K 3.5, Bun 23, creat 2.27

## 2016-03-12 NOTE — Assessment & Plan Note (Signed)
Sleeps well, continue Effexor and Mirtazapine.  

## 2016-03-12 NOTE — Progress Notes (Signed)
Location:  [facility] Room Number: 4 Place of Service:  SNF (31) Provider:  Rebecka Oelkers, Manxie  NP  Jeanmarie Hubert, MD  Patient Care Team: Estill Dooms, MD as PCP - General (Internal Medicine) Ellianne Gowen Otho Darner, NP as Nurse Practitioner (Internal Medicine)  Extended Emergency Contact Information Primary Emergency Contact: Phillis Haggis [address] [address] [place] Phone: [phone] Relation: Nephew  Code Status: DNR Goals of care: Advanced Directive information Advanced Directives 03/12/2016  Does Patient Have a Medical Advance Directive? Yes  Type of Advance Directive Living will;Healthcare Power of Attorney  Does patient want to make changes to medical advance directive? No - Patient declined  Copy of [place] in Chart? Yes  Pre-existing out of facility DNR order (yellow form or pink MOST form) -     Chief Complaint  Patient presents with  . Medical Management of Chronic Issues    HPI:  Pt is a 83 y.o. male seen today for medical management of chronic diseases.    Hx of HTN controlled while on Amlodipine 5mg , GERD is managed on Zantac, off Omeprazole, dementia has been stabilized on Namenda, resides in SNF for care needs. Hx of chronic kidney disease with baseline creat 2s. He is forgetful, but able to voice his needs while on Namenda Hx of BPH with recurrent urinary retention is the past, taking Tamsulosin and Finasteride, sleeps and eats well on Mirtazapine and Effexor.   Past Medical History:  Diagnosis Date  . Anemia   . Anxiety   . Back pain   . Cataract   . Chronic kidney disease 2013   rhabdo  . CKD (chronic kidney disease) stage 3, GFR 30-59 ml/min 08/29/2012   Creatinine 1.88 07/05/13(baseline 1.5-2.0) 02/11/14 creatinine 2.00 05/16/14 creatinine 2.31 07/18/14 creatinine 2.22     . CVA (cerebral infarction)   . Cystic disease of liver   . Dementia with behavioral disturbance 06/06/2012  . Depression 06/06/2012    . Diverticulosis   . Edema   . Gait disorder   . GERD (gastroesophageal reflux disease)   . H/O hiatal hernia 2008   repaired  . Hearing loss   . Hearing loss   . Hypertension   . Hyponatremia   . Inguinal hernia   . Keratosis   . Paresthesia   . Parkinsonism ([place])   . PVC (premature ventricular contraction)   . Sciatica   . Sciatica   . Stroke [facility]) 2010   no deficits  . Thyroid disease   . Vitamin D deficiency disease    Past Surgical History:  Procedure Laterality Date  . APPENDECTOMY    . ESOPHAGOGASTRODUODENOSCOPY  10/25/2011   Procedure: ESOPHAGOGASTRODUODENOSCOPY (EGD);  Surgeon: Winfield Cunas., MD;  Location: [facility] ENDOSCOPY;  Service: Endoscopy;  Laterality: N/A;  . HERNIA REPAIR    . PROSTATE SURGERY     cancer    Allergies  Allergen Reactions  . Olanzapine Other (See Comments)    unknown  . Rivastigmine Nausea Only    Allergies as of 03/12/2016      Reactions   Olanzapine Other (See Comments)   unknown   Rivastigmine Nausea Only      Medication List       Accurate as of 03/12/16  3:00 PM. Always use your most recent med list.          acetaminophen 500 MG tablet Commonly known as:  TYLENOL Take  500 mg by mouth at bedtime.   acetaminophen 325 MG tablet Commonly known as:  TYLENOL Take 650 mg by mouth every 4 (four) hours as needed.   amLODipine 5 MG tablet Commonly known as:  NORVASC Take 5 mg by mouth. Take one tablet daily for blood pressure   bisacodyl 10 MG suppository Commonly known as:  DULCOLAX Place 10 mg rectally daily as needed for moderate constipation.   feeding supplement Liqd Take 237 mLs by mouth daily as needed (nutrition). Take one can daily as needed   finasteride 5 MG tablet Commonly known as:  PROSCAR Take 5 mg by mouth daily.   Fish Oil 1000 MG Caps Take 2 capsules by mouth daily.   fluticasone 50 MCG/ACT nasal spray Commonly known as:  FLONASE Place 1 spray into both nostrils daily.   loteprednol 0.5  % ophthalmic suspension Commonly known as:  LOTEMAX Place 1 drop into the left eye daily.   magnesium hydroxide 400 MG/5ML suspension Commonly known as:  MILK OF MAGNESIA Take 30 mLs by mouth daily as needed for mild constipation.   Melatonin 3 MG Tabs Take 3 mg by mouth at bedtime.   mirtazapine 7.5 MG tablet Commonly known as:  REMERON Take 7.5 mg by mouth. Take one tablet at bedtime   NAMENDA XR 28 MG Cp24 24 hr capsule Generic drug:  memantine Take 28 mg by mouth daily. Take one daily for memory   PREVIDENT 5000 PLUS 1.1 % Crea dental cream Generic drug:  sodium fluoride Place 1 application onto teeth every evening.   senna 8.6 MG tablet Commonly known as:  SENOKOT Take 2 tablets by mouth. Take 2 tablets once daily   sodium chloride 5 % ophthalmic ointment Commonly known as:  MURO 0000000 Place 1 application into the left eye. Apply to left eye at bedtime   sodium chloride 2 % ophthalmic solution Commonly known as:  MURO 128 Place 1 drop into the left eye. One drop in left eye three times a day   SYSTANE 0.4-0.3 % Soln Generic drug:  Polyethyl Glycol-Propyl Glycol Place 1 drop into both eyes 3 (three) times daily.   tamsulosin 0.4 MG Caps capsule Commonly known as:  FLOMAX Take 0.4 mg by mouth every morning.   venlafaxine 75 MG tablet Commonly known as:  EFFEXOR Take 75 mg by mouth daily.   Vitamin D3 10000 units Tabs Take 1 tablet by mouth daily.       Review of Systems  Constitutional: Negative for chills, diaphoresis, fever and unexpected weight change.  HENT: Positive for hearing loss, sinus pressure and voice change (hoarse). Negative for congestion, ear discharge, ear pain, nosebleeds, postnasal drip, rhinorrhea, sore throat and tinnitus.        Nasal congestion at night  Eyes: Negative for photophobia, pain, discharge and redness.  Respiratory: Negative for cough, shortness of breath, wheezing and stridor.   Cardiovascular: Negative for chest pain,  palpitations and leg swelling.  Gastrointestinal: Negative for abdominal pain, blood in stool, constipation, diarrhea, nausea and vomiting.  Endocrine: Negative for polydipsia.  Genitourinary: Positive for frequency. Negative for dysuria, flank pain, hematuria and urgency.  Musculoskeletal: Positive for back pain and gait problem. Negative for myalgias and neck pain.  Skin: Negative for rash.  Allergic/Immunologic: Negative for environmental allergies.  Neurological: Negative for dizziness, tremors, seizures, weakness and headaches.  Hematological: Does not bruise/bleed easily.  Psychiatric/Behavioral: Positive for sleep disturbance. Negative for hallucinations and suicidal ideas. The patient is not nervous/anxious.  Immunization History  Administered Date(s) Administered  . DTaP 10/31/2011  . Influenza Whole 12/20/2012  . Influenza-Unspecified 12/06/2013, 12/06/2014, 12/11/2015  . Pneumococcal-Unspecified 10/31/2011  . Tdap 10/24/2011   Pertinent  Health Maintenance Due  Topic Date Due  . PNA vac Low Risk Adult (2 of 2 - PCV13) 10/30/2012  . INFLUENZA VACCINE  Completed   Fall Risk  01/12/2016 09/12/2014 05/23/2014  Falls in the past year? Yes Yes No  Number falls in past yr: 1 1 -  Injury with Fall? Yes No -  Risk for fall due to : - - History of fall(s);Impaired balance/gait   Functional Status Survey:    Vitals:   03/12/16 1252  BP: 138/84  Pulse: 73  Resp: 20  Temp: 98.2 F (36.8 C)  Weight: 137 lb (62.1 kg)  Height: 5\' 3"  (1.6 m)   Body mass index is 24.27 kg/m. Physical Exam  Constitutional: He appears well-developed and well-nourished.  HENT:  Head: Normocephalic and atraumatic. Not macrocephalic.  Nose: No mucosal edema or rhinorrhea. Right sinus exhibits no maxillary sinus tenderness and no frontal sinus tenderness. Left sinus exhibits no maxillary sinus tenderness and no frontal sinus tenderness.  Mouth/Throat: Uvula is midline. Posterior oropharyngeal  erythema present. No oropharyngeal exudate or posterior oropharyngeal edema.  Eyes: EOM are normal. Pupils are equal, round, and reactive to light.  Neck: Normal range of motion. Neck supple. No JVD present. No thyromegaly present.  Cardiovascular: Normal rate and regular rhythm.   Murmur heard.  Systolic murmur is present with a grade of 2/6  Pulmonary/Chest: Effort normal and breath sounds normal. He has no wheezes. He has no rales.  Abdominal: Soft. Bowel sounds are normal. There is no tenderness.  Musculoskeletal: Normal range of motion. He exhibits tenderness. He exhibits no edema.  Scoliokyphosis, walking with walker with SBA. Chronic back pain. 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  Lymphadenopathy:    He has no cervical adenopathy.  Neurological: He is alert. He displays normal reflexes. No cranial nerve deficit. He exhibits normal muscle tone. Coordination normal.  Mumbling and incoherent answers sometimes.  Skin: Skin is warm and dry. No rash noted. No erythema.  AK top of head  Psychiatric: His mood appears not anxious. His affect is not angry, not blunt and not labile. His speech is not delayed and not slurred. He is not agitated, not aggressive, not slowed, not withdrawn, not actively hallucinating and not combative. Thought content is not paranoid and not delusional. Cognition and memory are impaired. He expresses impulsivity. He does not exhibit a depressed mood. He exhibits abnormal recent memory. He is attentive.    Labs reviewed:  Recent Labs  12/09/15 12/11/15 12/15/15  NA 125* 129* 133*  K 3.8 3.5 3.7  BUN 25* 23* 33*  CREATININE 2.4* 2.3* 2.7*    Recent Labs  06/12/15 12/06/15 12/08/15  AST 12* 18 15  ALT 8* 11 11  ALKPHOS 76 67 77    Recent Labs  06/12/15 12/06/15 12/08/15  WBC 9.1 5.9 7.6  HGB 10.1* 10.1* 10.0*  HCT 31* 30* 29*  PLT 204 216 214   Lab Results  Component Value Date   TSH 1.09 06/12/2015   Lab Results  Component Value  Date   HGBA1C 5.3 06/12/2015   No results found for: CHOL, HDL, LDLCALC, LDLDIRECT, TRIG, CHOLHDL  Significant Diagnostic Results in last 30 days:  No results found.  Assessment/Plan Hypertension Controlled, continue Amlodipine 5mg   GERD (gastroesophageal reflux disease) Stable,  off Omeprazole, Zantac.      Constipation Stable, continue Senokot S II bid and MOM qd prn, Doculax suppository prn.       Dementia with behavioral disturbance Functioning well in SNF, stays in his room most of time, ambulates with walker, continue Namenda    CKD (chronic kidney disease) stage 3, GFR 30-59 ml/min 12/11/15 Na 129, K 3.5, Bun 23, creat 2.27   BPH (benign prostatic hyperplasia) Last urinary retention, Foley inserted 11/04/12 at ED-successfully removed--continued Tamsulosin and Finasteride    Anemia in chronic kidney disease 12/08/15 Hgb 10.1  Depression Mood is stable, continue Effexor 75mg  and Mirtazapine 7.5mg  qhs due to relapsed insomnia(off it since 11/18/15).  Sleeps well at night, 06/12/15 TSH 1.09. Hgb a1c 5.3   Hyponatremia Last Na 133 12/15/15, repeat BMP  Insomnia Sleeps well, continue Effexor and Mirtazapine.   Loss of weight 09/2015 #152 Ibs, 10/2015 # 150Ibs, 11/2015 # 143Ibs, 12/2015 # 137 Ibs, stabilized, off Remeron since 10/2015 and resumed 11/2015     Family/ staff Communication: SNF  Labs/tests ordered:  BMP

## 2016-03-12 NOTE — Assessment & Plan Note (Signed)
Functioning well in SNF, stays in his room most of time, ambulates with walker, continue Namenda 

## 2016-03-12 NOTE — Assessment & Plan Note (Signed)
Last Na 133 12/15/15, repeat BMP

## 2016-03-12 NOTE — Assessment & Plan Note (Signed)
09/2015 #152 Ibs, 10/2015 # 150Ibs, 11/2015 # 143Ibs, 12/2015 # 137 Ibs, stabilized, off Remeron since 10/2015 and resumed 11/2015

## 2016-03-12 NOTE — Assessment & Plan Note (Signed)
12/08/15 Hgb 10.1

## 2016-03-12 NOTE — Assessment & Plan Note (Signed)
Stable, off Omeprazole, Zantac.  

## 2016-03-12 NOTE — Assessment & Plan Note (Signed)
Stable, continue Senokot S II bid and MOM qd prn, Doculax suppository prn.

## 2016-03-12 NOTE — Assessment & Plan Note (Signed)
Last urinary retention, Foley inserted 11/04/12 at ED-successfully removed--continued Tamsulosin and Finasteride 

## 2016-03-12 NOTE — Assessment & Plan Note (Signed)
Controlled, continue Amlodipine 5mg  

## 2016-03-12 NOTE — Assessment & Plan Note (Signed)
Mood is stable, continue Effexor 75mg  and Mirtazapine 7.5mg  qhs due to relapsed insomnia(off it since 11/18/15).  Sleeps well at night, 06/12/15 TSH 1.09. Hgb a1c 5.3

## 2016-03-15 DIAGNOSIS — I10 Essential (primary) hypertension: Secondary | ICD-10-CM | POA: Diagnosis not present

## 2016-03-15 LAB — BASIC METABOLIC PANEL
BUN: 54 mg/dL — AB (ref 4–21)
Creatinine: 3.3 mg/dL — AB (ref <=1.3)
GLUCOSE: 90 mg/dL
Potassium: 4.2 mmol/L (ref 3.4–5.3)
SODIUM: 135 mmol/L — AB (ref 137–147)

## 2016-03-16 ENCOUNTER — Other Ambulatory Visit: Payer: Self-pay | Admitting: *Deleted

## 2016-03-16 ENCOUNTER — Telehealth: Payer: Self-pay | Admitting: *Deleted

## 2016-03-16 NOTE — Telephone Encounter (Signed)
Nephew, Won Sipin called and stated that he received a call from [facility] stating that his uncles Kidney Function has worsened. He wants to speak with you regarding this and wants your opinion about a referral to a Nephrologist. He wants you to call him at [phone]

## 2016-03-22 NOTE — Telephone Encounter (Signed)
Discussed and order written for neph consult.

## 2016-04-05 ENCOUNTER — Non-Acute Institutional Stay (SKILLED_NURSING_FACILITY): Payer: Medicare Other | Admitting: Internal Medicine

## 2016-04-05 ENCOUNTER — Encounter: Payer: Self-pay | Admitting: Internal Medicine

## 2016-04-05 DIAGNOSIS — F01518 Vascular dementia, unspecified severity, with other behavioral disturbance: Secondary | ICD-10-CM

## 2016-04-05 DIAGNOSIS — N183 Chronic kidney disease, stage 3 unspecified: Secondary | ICD-10-CM

## 2016-04-05 DIAGNOSIS — R269 Unspecified abnormalities of gait and mobility: Secondary | ICD-10-CM

## 2016-04-05 DIAGNOSIS — E871 Hypo-osmolality and hyponatremia: Secondary | ICD-10-CM | POA: Diagnosis not present

## 2016-04-05 DIAGNOSIS — R634 Abnormal weight loss: Secondary | ICD-10-CM | POA: Diagnosis not present

## 2016-04-05 DIAGNOSIS — I10 Essential (primary) hypertension: Secondary | ICD-10-CM | POA: Diagnosis not present

## 2016-04-05 DIAGNOSIS — D631 Anemia in chronic kidney disease: Secondary | ICD-10-CM

## 2016-04-05 DIAGNOSIS — F0151 Vascular dementia with behavioral disturbance: Secondary | ICD-10-CM

## 2016-04-05 NOTE — Progress Notes (Signed)
Progress Note    Location:  [facility] [place] Room Number: N4 Place of Service:  SNF [id number]) Provider:  Jeanmarie Hubert, MD  Patient Care Team: Estill Dooms, MD as PCP - General (Internal Medicine) Man Otho Darner, NP as Nurse Practitioner (Internal Medicine)  Extended Emergency Contact Information Primary Emergency Contact: Phillis Haggis [address] [place] Phone: [phone] Relation: Nephew  Code Status:  DNR Goals of care: Advanced Directive information Advanced Directives 04/05/2016  Does Patient Have a Medical Advance Directive? Yes  Type of Paramedic of [place];Living will;Out of facility DNR (pink MOST or yellow form)  Does patient want to make changes to medical advance directive? -  Copy of [place] in Chart? Yes  Pre-existing out of facility DNR order (yellow form or pink MOST form) Yellow form placed in chart (order not valid for inpatient use);Pink MOST form placed in chart (order not valid for inpatient use)     Chief Complaint  Patient presents with  . Medical Management of Chronic Issues    routine visit    HPI:  Vincent Potts is a 83 y.o. male seen today routine management of medical conditions.   CKD (chronic kidney disease) stage 3, GFR 30-59 ml/min - worsening of the BUN and creatinine poveer the last few months. His nephew asks for nephrology consult.  Vascular dementia with behavior disturbance - gradual worsening of memory loss.  Gait disorder - chronic and unchanged. Using walker.  Anemia in stage 3 chronic kidney disease - stable  Essential hypertension - controlled  Hyponatremia - miod and stable  Loss of weight - no longer losing weight.     Past Medical History:  Diagnosis Date  . Anemia   . Anxiety   . Back pain   . Cataract   . Chronic kidney disease 2013   rhabdo  . CKD (chronic kidney disease) stage 3, GFR 30-59 ml/min 08/29/2012   Creatinine 1.88  07/05/13(baseline 1.5-2.0) 02/11/14 creatinine 2.00 05/16/14 creatinine 2.31 07/18/14 creatinine 2.22     . CVA (cerebral infarction)   . Cystic disease of liver   . Dementia with behavioral disturbance 06/06/2012  . Depression 06/06/2012  . Diverticulosis   . Edema   . Gait disorder   . GERD (gastroesophageal reflux disease)   . H/O hiatal hernia 2008   repaired  . Hearing loss   . Hearing loss   . Hypertension   . Hyponatremia   . Inguinal hernia   . Keratosis   . Paresthesia   . Parkinsonism ([place])   . PVC (premature ventricular contraction)   . Sciatica   . Sciatica   . Stroke [facility]) 2010   no deficits  . Thyroid disease   . Vitamin D deficiency disease    Past Surgical History:  Procedure Laterality Date  . APPENDECTOMY    . ESOPHAGOGASTRODUODENOSCOPY  10/25/2011   Procedure: ESOPHAGOGASTRODUODENOSCOPY (EGD);  Surgeon: Winfield Cunas., MD;  Location: [facility] ENDOSCOPY;  Service: Endoscopy;  Laterality: N/A;  . HERNIA REPAIR    . PROSTATE SURGERY     cancer    Allergies  Allergen Reactions  . Olanzapine Other (See Comments)    unknown  . Rivastigmine Nausea Only    Allergies as of 04/05/2016      Reactions   Olanzapine Other (See Comments)   unknown   Rivastigmine Nausea Only      Medication  List       Accurate as of 04/05/16  2:07 PM. Always use your most recent med list.          acetaminophen 500 MG tablet Commonly known as:  TYLENOL Take 500 mg by mouth at bedtime.   acetaminophen 325 MG tablet Commonly known as:  TYLENOL Take 650 mg by mouth every 4 (four) hours as needed.   amLODipine 5 MG tablet Commonly known as:  NORVASC Take 5 mg by mouth. Take one tablet daily for blood pressure   bisacodyl 10 MG suppository Commonly known as:  DULCOLAX Place 10 mg rectally daily as needed for moderate constipation.   finasteride 5 MG tablet Commonly known as:  PROSCAR Take 5 mg by mouth daily.   Fish Oil 1000 MG Caps Take 2 capsules by mouth daily.     fluticasone 50 MCG/ACT nasal spray Commonly known as:  FLONASE Place 1 spray into both nostrils daily.   loteprednol 0.5 % ophthalmic suspension Commonly known as:  LOTEMAX Place 1 drop into the left eye daily.   magnesium hydroxide 400 MG/5ML suspension Commonly known as:  MILK OF MAGNESIA Take 30 mLs by mouth daily as needed for mild constipation.   Melatonin 3 MG Tabs Take 3 mg by mouth at bedtime.   mirtazapine 7.5 MG tablet Commonly known as:  REMERON Take 7.5 mg by mouth. Take one tablet at bedtime   NAMENDA XR 28 MG Cp24 24 hr capsule Generic drug:  memantine Take 28 mg by mouth daily. Take one daily for memory   PREVIDENT 5000 PLUS 1.1 % Crea dental cream Generic drug:  sodium fluoride Place 1 application onto teeth every evening.   RESOURCE BREEZE PO Take by mouth. 120 ml three times a day, nutritional supplement   senna 8.6 MG tablet Commonly known as:  SENOKOT Take 2 tablets by mouth. Take 2 tablets once daily   sodium chloride 5 % ophthalmic ointment Commonly known as:  MURO 0000000 Place 1 application into the left eye. Apply to left eye at bedtime   sodium chloride 2 % ophthalmic solution Commonly known as:  MURO 128 Place 1 drop into the left eye. One drop in left eye three times a day   SYSTANE 0.4-0.3 % Soln Generic drug:  Polyethyl Glycol-Propyl Glycol Place 1 drop into both eyes 3 (three) times daily.   tamsulosin 0.4 MG Caps capsule Commonly known as:  FLOMAX Take 0.4 mg by mouth every morning.   venlafaxine 75 MG tablet Commonly known as:  EFFEXOR Take 75 mg by mouth daily.   Vitamin D-3 1000 units Caps Take by mouth. Take one capsule daily       Review of Systems  Constitutional: Negative for chills, diaphoresis, fever and unexpected weight change.  HENT: Positive for hearing loss, sinus pressure and voice change (hoarse). Negative for congestion, ear discharge, ear pain, nosebleeds, postnasal drip, rhinorrhea, sore throat and  tinnitus.        Nasal congestion at night  Eyes: Negative for photophobia, pain, discharge and redness.  Respiratory: Negative for cough, shortness of breath, wheezing and stridor.   Cardiovascular: Negative for chest pain, palpitations and leg swelling.  Gastrointestinal: Negative for abdominal pain, blood in stool, constipation, diarrhea, nausea and vomiting.  Endocrine: Negative for polydipsia.  Genitourinary: Positive for frequency. Negative for dysuria, flank pain, hematuria and urgency.       CKD 3 Recurrent UTI. Last with pseudomonas.  Musculoskeletal: Positive for back pain and gait problem. Negative  for myalgias and neck pain.  Skin: Negative for rash.  Allergic/Immunologic: Negative for environmental allergies.  Neurological: Positive for weakness. Negative for dizziness, tremors, seizures and headaches.       Memory loss  Hematological: Does not bruise/bleed easily.  Psychiatric/Behavioral: Positive for confusion and sleep disturbance. Negative for hallucinations and suicidal ideas. The patient is not nervous/anxious.     Immunization History  Administered Date(s) Administered  . DTaP 10/31/2011  . Influenza Whole 12/20/2012  . Influenza-Unspecified 12/06/2013, 12/06/2014, 12/11/2015  . Pneumococcal-Unspecified 10/31/2011  . Tdap 10/24/2011   Pertinent  Health Maintenance Due  Topic Date Due  . PNA vac Low Risk Adult (2 of 2 - PCV13) 10/30/2012  . INFLUENZA VACCINE  Completed   Fall Risk  01/12/2016 09/12/2014 05/23/2014  Falls in the past year? Yes Yes No  Number falls in past yr: 1 1 -  Injury with Fall? Yes No -  Risk for fall due to : - - History of fall(s);Impaired balance/gait    Vitals:   04/05/16 1350  BP: 130/70  Pulse: 70  Resp: 18  Temp: 97.6 F (36.4 C)  SpO2: 96%  Weight: 137 lb (62.1 kg)  Height: 5\' 3"  (1.6 m)   Body mass index is 24.27 kg/m.  Wt Readings from Last 3 Encounters:  04/05/16 137 lb (62.1 kg)  03/12/16 137 lb (62.1 kg)   02/10/16 136 lb (61.7 kg)    Physical Exam  Constitutional: He appears well-developed and well-nourished.  HENT:  Head: Normocephalic and atraumatic. Not macrocephalic.  Nose: No mucosal edema or rhinorrhea. Right sinus exhibits no maxillary sinus tenderness and no frontal sinus tenderness. Left sinus exhibits no maxillary sinus tenderness and no frontal sinus tenderness.  Mouth/Throat: Uvula is midline. Posterior oropharyngeal erythema present. No oropharyngeal exudate or posterior oropharyngeal edema.  Eyes: EOM are normal. Pupils are equal, round, and reactive to light.  Neck: Normal range of motion. Neck supple. No JVD present. No thyromegaly present.  Cardiovascular: Normal rate and regular rhythm.   Murmur heard.  Systolic murmur is present with a grade of 2/6  Pulmonary/Chest: Effort normal and breath sounds normal. He has no wheezes. He has no rales.  Abdominal: Soft. Bowel sounds are normal. There is no tenderness.  Musculoskeletal: Normal range of motion. He exhibits tenderness. He exhibits no edema.  Scoliokyphosis, walking with walker with SBA. Chronic back pain. 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  Lymphadenopathy:    He has no cervical adenopathy.  Neurological: He is alert. He displays normal reflexes. No cranial nerve deficit. He exhibits normal muscle tone. Coordination normal.  Memory loss. Mumbling and incoherent answers sometimes.  Skin: Skin is warm and dry. No rash noted. No erythema.  AK top of head  Psychiatric: He has a normal mood and affect. His behavior is normal. His mood appears not anxious. His affect is not angry, not blunt and not labile. His speech is not delayed and not slurred. He is not agitated, not aggressive, not slowed, not withdrawn, not actively hallucinating and not combative. Thought content is not paranoid and not delusional. Cognition and memory are impaired. He expresses impulsivity. He does not exhibit a depressed mood. He  exhibits abnormal recent memory. He is attentive.    Labs reviewed:  Recent Labs  12/11/15 12/15/15 03/15/16  NA 129* 133* 135*  K 3.5 3.7 4.2  BUN 23* 33* 54*  CREATININE 2.3* 2.7* 3.3*    Recent Labs  06/12/15 12/06/15 12/08/15  AST 12*  18 15  ALT 8* 11 11  ALKPHOS 76 67 77    Recent Labs  06/12/15 12/06/15 12/08/15  WBC 9.1 5.9 7.6  HGB 10.1* 10.1* 10.0*  HCT 31* 30* 29*  PLT 204 216 214   Lab Results  Component Value Date   TSH 1.09 06/12/2015   Lab Results  Component Value Date   HGBA1C 5.3 06/12/2015   Assessment/Plan 1. CKD (chronic kidney disease) stage 3, GFR 30-59 ml/min -BMP -UA and culture to screen for relapse of infection that may be associated with worsening BUN and creatinine -consult with nephrology  2. Vascular dementia with behavior disturbance Chronic and worse as compared to last year  3. Gait disorder Continue to use walker  4. Anemia in stage 3 chronic kidney disease Follow lab pereiodically  5. Essential hypertension controlled  6. Hyponatremia Follow lab periodically  7. Loss of weight No longer losing weight

## 2016-04-06 DIAGNOSIS — N390 Urinary tract infection, site not specified: Secondary | ICD-10-CM | POA: Diagnosis not present

## 2016-04-13 ENCOUNTER — Non-Acute Institutional Stay (SKILLED_NURSING_FACILITY): Payer: Medicare Other | Admitting: Nurse Practitioner

## 2016-04-13 ENCOUNTER — Encounter: Payer: Self-pay | Admitting: Nurse Practitioner

## 2016-04-13 DIAGNOSIS — F01518 Vascular dementia, unspecified severity, with other behavioral disturbance: Secondary | ICD-10-CM

## 2016-04-13 DIAGNOSIS — F320 Major depressive disorder, single episode, mild: Secondary | ICD-10-CM

## 2016-04-13 DIAGNOSIS — F0151 Vascular dementia with behavioral disturbance: Secondary | ICD-10-CM | POA: Diagnosis not present

## 2016-04-13 DIAGNOSIS — N400 Enlarged prostate without lower urinary tract symptoms: Secondary | ICD-10-CM

## 2016-04-13 DIAGNOSIS — K219 Gastro-esophageal reflux disease without esophagitis: Secondary | ICD-10-CM | POA: Diagnosis not present

## 2016-04-13 DIAGNOSIS — D631 Anemia in chronic kidney disease: Secondary | ICD-10-CM | POA: Diagnosis not present

## 2016-04-13 DIAGNOSIS — N183 Chronic kidney disease, stage 3 unspecified: Secondary | ICD-10-CM

## 2016-04-13 DIAGNOSIS — K5900 Constipation, unspecified: Secondary | ICD-10-CM

## 2016-04-13 DIAGNOSIS — E871 Hypo-osmolality and hyponatremia: Secondary | ICD-10-CM | POA: Diagnosis not present

## 2016-04-13 DIAGNOSIS — M5440 Lumbago with sciatica, unspecified side: Secondary | ICD-10-CM

## 2016-04-13 DIAGNOSIS — F5101 Primary insomnia: Secondary | ICD-10-CM | POA: Diagnosis not present

## 2016-04-13 DIAGNOSIS — I10 Essential (primary) hypertension: Secondary | ICD-10-CM | POA: Diagnosis not present

## 2016-04-13 NOTE — Assessment & Plan Note (Signed)
12/08/15 Hgb 10.1

## 2016-04-13 NOTE — Assessment & Plan Note (Signed)
Mood is stable, continue Effexor 75mg and Mirtazapine 7.5mg qhs due to relapsed insomnia(off it since 11/18/15).  Sleeps well at night 

## 2016-04-13 NOTE — Assessment & Plan Note (Signed)
Severe kyphosis, only walks a few steps 

## 2016-04-13 NOTE — Assessment & Plan Note (Signed)
Stable, off Omeprazole, Zantac.  

## 2016-04-13 NOTE — Assessment & Plan Note (Signed)
03/15/16 Na 135

## 2016-04-13 NOTE — Assessment & Plan Note (Signed)
03/15/16 Na 135, K 4.2, Bun 42, creat 3.7, update BMP

## 2016-04-13 NOTE — Assessment & Plan Note (Signed)
Sleeps well, continue Effexor and Mirtazapine.  

## 2016-04-13 NOTE — Assessment & Plan Note (Signed)
Controlled, continue Amlodipine 5mg  

## 2016-04-13 NOTE — Progress Notes (Signed)
Location:  [place] Room Number: N4 Place of Service:  SNF (31) Provider:  Mast, Manxie  NP  Jeanmarie Hubert, MD  Patient Care Team: Estill Dooms, MD as PCP - General (Internal Medicine) Man Otho Darner, NP as Nurse Practitioner (Internal Medicine)  Extended Emergency Contact Information Primary Emergency Contact: Phillis Haggis [address] [address] of [place] Phone: [phone] Relation: Nephew  Code Status: DNR Goals of care: Advanced Directive information Advanced Directives 04/05/2016  Does Patient Have a Medical Advance Directive? Yes  Type of Paramedic of [place];Living will;Out of facility DNR (pink MOST or yellow form)  Does patient want to make changes to medical advance directive? -  Copy of [place] in Chart? Yes  Pre-existing out of facility DNR order (yellow form or pink MOST form) Yellow form placed in chart (order not valid for inpatient use);Pink MOST form placed in chart (order not valid for inpatient use)     Chief Complaint  Patient presents with  . Medical Management of Chronic Issues    HPI:  Pt is a 83 y.o. male seen today for medical management of chronic diseases.    Hx of HTN controlled while on Amlodipine 5mg , GERD is stable, off Zantac, Omeprazole, dementia has been stabilized on Namenda, resides in SNF for care needs. Hx of chronic kidney disease with baseline creat 2s. He is forgetful, but able to voice his needs while on Namenda Hx of BPH with recurrent urinary retention is the past, taking Tamsulosin and Finasteride, sleeps and eats well on Mirtazapine and Effexor.   Past Medical History:  Diagnosis Date  . Anemia   . Anxiety   . Back pain   . Cataract   . Chronic kidney disease 2013   rhabdo  . CKD (chronic kidney disease) stage 3, GFR 30-59 ml/min 08/29/2012   Creatinine 1.88 07/05/13(baseline 1.5-2.0) 02/11/14 creatinine 2.00 05/16/14 creatinine 2.31 07/18/14  creatinine 2.22     . CVA (cerebral infarction)   . Cystic disease of liver   . Dementia with behavioral disturbance 06/06/2012  . Depression 06/06/2012  . Diverticulosis   . Edema   . Gait disorder   . GERD (gastroesophageal reflux disease)   . H/O hiatal hernia 2008   repaired  . Hearing loss   . Hearing loss   . Hypertension   . Hyponatremia   . Inguinal hernia   . Keratosis   . Paresthesia   . Parkinsonism ([place])   . PVC (premature ventricular contraction)   . Sciatica   . Sciatica   . Stroke [facility]) 2010   no deficits  . Thyroid disease   . Vitamin D deficiency disease    Past Surgical History:  Procedure Laterality Date  . APPENDECTOMY    . ESOPHAGOGASTRODUODENOSCOPY  10/25/2011   Procedure: ESOPHAGOGASTRODUODENOSCOPY (EGD);  Surgeon: Winfield Cunas., MD;  Location: [facility] ENDOSCOPY;  Service: Endoscopy;  Laterality: N/A;  . HERNIA REPAIR    . PROSTATE SURGERY     cancer    Allergies  Allergen Reactions  . Olanzapine Other (See Comments)    unknown  . Rivastigmine Nausea Only    Allergies as of 04/13/2016      Reactions   Olanzapine Other (See Comments)   unknown   Rivastigmine Nausea Only      Medication List       Accurate as of 04/13/16  4:14 PM. Always use  your most recent med list.          acetaminophen 500 MG tablet Commonly known as:  TYLENOL Take 500 mg by mouth at bedtime.   acetaminophen 325 MG tablet Commonly known as:  TYLENOL Take 650 mg by mouth every 4 (four) hours as needed.   amLODipine 5 MG tablet Commonly known as:  NORVASC Take 5 mg by mouth. Take one tablet daily for blood pressure   bisacodyl 10 MG suppository Commonly known as:  DULCOLAX Place 10 mg rectally daily as needed for moderate constipation.   finasteride 5 MG tablet Commonly known as:  PROSCAR Take 5 mg by mouth daily.   Fish Oil 1000 MG Caps Take 2 capsules by mouth daily.   fluticasone 50 MCG/ACT nasal spray Commonly known as:  FLONASE Place 1 spray  into both nostrils daily.   loteprednol 0.5 % ophthalmic suspension Commonly known as:  LOTEMAX Place 1 drop into the left eye daily.   magnesium hydroxide 400 MG/5ML suspension Commonly known as:  MILK OF MAGNESIA Take 30 mLs by mouth daily as needed for mild constipation.   Melatonin 3 MG Tabs Take 3 mg by mouth at bedtime.   mirtazapine 7.5 MG tablet Commonly known as:  REMERON Take 7.5 mg by mouth. Take one tablet at bedtime   NAMENDA XR 28 MG Cp24 24 hr capsule Generic drug:  memantine Take 28 mg by mouth daily. Take one daily for memory   PREVIDENT 5000 PLUS 1.1 % Crea dental cream Generic drug:  sodium fluoride Place 1 application onto teeth every evening.   RESOURCE BREEZE PO Take by mouth. 120 ml three times a day, nutritional supplement   senna 8.6 MG tablet Commonly known as:  SENOKOT Take 2 tablets by mouth. Take 2 tablets once daily   sodium chloride 5 % ophthalmic ointment Commonly known as:  MURO 0000000 Place 1 application into the left eye. Apply to left eye at bedtime   sodium chloride 2 % ophthalmic solution Commonly known as:  MURO 128 Place 1 drop into the left eye. One drop in left eye three times a day   SYSTANE 0.4-0.3 % Soln Generic drug:  Polyethyl Glycol-Propyl Glycol Place 1 drop into both eyes 3 (three) times daily.   tamsulosin 0.4 MG Caps capsule Commonly known as:  FLOMAX Take 0.4 mg by mouth every morning.   venlafaxine 75 MG tablet Commonly known as:  EFFEXOR Take 75 mg by mouth daily.   Vitamin D-3 1000 units Caps Take by mouth. Take one capsule daily       Review of Systems  Constitutional: Negative for chills, diaphoresis, fever and unexpected weight change.  HENT: Positive for hearing loss, sinus pressure and voice change (hoarse). Negative for congestion, ear discharge, ear pain, nosebleeds, postnasal drip, rhinorrhea, sore throat and tinnitus.        Nasal congestion at night  Eyes: Negative for photophobia, pain,  discharge and redness.  Respiratory: Negative for cough, shortness of breath, wheezing and stridor.   Cardiovascular: Negative for chest pain, palpitations and leg swelling.  Gastrointestinal: Negative for abdominal pain, blood in stool, constipation, diarrhea, nausea and vomiting.  Endocrine: Negative for polydipsia.  Genitourinary: Positive for frequency. Negative for dysuria, flank pain, hematuria and urgency.  Musculoskeletal: Positive for back pain and gait problem. Negative for myalgias and neck pain.  Skin: Negative for rash.  Allergic/Immunologic: Negative for environmental allergies.  Neurological: Negative for dizziness, tremors, seizures, weakness and headaches.  Hematological: Does not  bruise/bleed easily.  Psychiatric/Behavioral: Positive for sleep disturbance. Negative for hallucinations and suicidal ideas. The patient is not nervous/anxious.     Immunization History  Administered Date(s) Administered  . DTaP 10/31/2011  . Influenza Whole 12/20/2012  . Influenza-Unspecified 12/06/2013, 12/06/2014, 12/11/2015  . Pneumococcal-Unspecified 10/31/2011  . Tdap 10/24/2011   Pertinent  Health Maintenance Due  Topic Date Due  . PNA vac Low Risk Adult (2 of 2 - PCV13) 10/30/2012  . INFLUENZA VACCINE  Completed   Fall Risk  01/12/2016 09/12/2014 05/23/2014  Falls in the past year? Yes Yes No  Number falls in past yr: 1 1 -  Injury with Fall? Yes No -  Risk for fall due to : - - History of fall(s);Impaired balance/gait   Functional Status Survey:    Vitals:   04/13/16 1302  BP: (!) 152/83  Pulse: 76  Resp: (!) 22  Temp: 98.1 F (36.7 C)  SpO2: 97%   There is no height or weight on file to calculate BMI. Physical Exam  Constitutional: He appears well-developed and well-nourished.  HENT:  Head: Normocephalic and atraumatic. Not macrocephalic.  Nose: No mucosal edema or rhinorrhea. Right sinus exhibits no maxillary sinus tenderness and no frontal sinus tenderness. Left  sinus exhibits no maxillary sinus tenderness and no frontal sinus tenderness.  Mouth/Throat: Uvula is midline. Posterior oropharyngeal erythema present. No oropharyngeal exudate or posterior oropharyngeal edema.  Eyes: EOM are normal. Pupils are equal, round, and reactive to light.  Neck: Normal range of motion. Neck supple. No JVD present. No thyromegaly present.  Cardiovascular: Normal rate and regular rhythm.   Murmur heard.  Systolic murmur is present with a grade of 2/6  Pulmonary/Chest: Effort normal and breath sounds normal. He has no wheezes. He has no rales.  Abdominal: Soft. Bowel sounds are normal. There is no tenderness.  Musculoskeletal: Normal range of motion. He exhibits tenderness. He exhibits no edema.  Scoliokyphosis, walking with walker with SBA. Chronic back pain. 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  Lymphadenopathy:    He has no cervical adenopathy.  Neurological: He is alert. He displays normal reflexes. No cranial nerve deficit. He exhibits normal muscle tone. Coordination normal.  Mumbling and incoherent answers sometimes.  Skin: Skin is warm and dry. No rash noted. No erythema.  AK top of head  Psychiatric: His mood appears not anxious. His affect is not angry, not blunt and not labile. His speech is not delayed and not slurred. He is not agitated, not aggressive, not slowed, not withdrawn, not actively hallucinating and not combative. Thought content is not paranoid and not delusional. Cognition and memory are impaired. He expresses impulsivity. He does not exhibit a depressed mood. He exhibits abnormal recent memory. He is attentive.    Labs reviewed:  Recent Labs  12/11/15 12/15/15 03/15/16  NA 129* 133* 135*  K 3.5 3.7 4.2  BUN 23* 33* 54*  CREATININE 2.3* 2.7* 3.3*    Recent Labs  06/12/15 12/06/15 12/08/15  AST 12* 18 15  ALT 8* 11 11  ALKPHOS 76 67 77    Recent Labs  06/12/15 12/06/15 12/08/15  WBC 9.1 5.9 7.6  HGB 10.1* 10.1*  10.0*  HCT 31* 30* 29*  PLT 204 216 214   Lab Results  Component Value Date   TSH 1.09 06/12/2015   Lab Results  Component Value Date   HGBA1C 5.3 06/12/2015   No results found for: CHOL, HDL, LDLCALC, LDLDIRECT, TRIG, CHOLHDL  Significant Diagnostic Results  in last 30 days:  No results found.  Assessment/Plan Hypertension Controlled, continue Amlodipine 5mg   GERD (gastroesophageal reflux disease) Stable, off Omeprazole, Zantac.   Constipation Stable, continue Senokot S II qhs and MOM qd prn, Doculax suppository prn.      Dementia with behavioral disturbance Functioning well in SNF, stays in his room most of time, ambulates with walker, continue Namenda   CKD (chronic kidney disease) stage 3, GFR 30-59 ml/min 03/15/16 Na 135, K 4.2, Bun 42, creat 3.7, update BMP  Anemia in chronic kidney disease 12/08/15 Hgb 10.1   BPH (benign prostatic hyperplasia) Last urinary retention, Foley inserted 11/04/12 at ED-successfully removed--continued Tamsulosin and Finasteride  Back pain Severe kyphosis, only walks a few steps  Depression Mood is stable, continue Effexor 75mg  and Mirtazapine 7.5mg  qhs due to relapsed insomnia(off it since 11/18/15).  Sleeps well at night  Hyponatremia 03/15/16 Na 135  Insomnia Sleeps well, continue Effexor and Mirtazapine.       Family/ staff Communication: SNF  Labs/tests ordered: BMP

## 2016-04-13 NOTE — Assessment & Plan Note (Signed)
Last urinary retention, Foley inserted 11/04/12 at ED-successfully removed--continued Tamsulosin and Finasteride 

## 2016-04-13 NOTE — Assessment & Plan Note (Signed)
Stable, continue Senokot S II qhs and MOM qd prn, Doculax suppository prn.  

## 2016-04-13 NOTE — Assessment & Plan Note (Signed)
Functioning well in SNF, stays in his room most of time, ambulates with walker, continue Namenda 

## 2016-04-15 DIAGNOSIS — I10 Essential (primary) hypertension: Secondary | ICD-10-CM | POA: Diagnosis not present

## 2016-04-15 LAB — BASIC METABOLIC PANEL
BUN: 61 mg/dL — AB (ref 4–21)
Creatinine: 3.3 mg/dL — AB (ref <=1.3)
GLUCOSE: 89 mg/dL
POTASSIUM: 4.5 mmol/L (ref 3.4–5.3)
SODIUM: 138 mmol/L (ref 137–147)

## 2016-04-16 ENCOUNTER — Other Ambulatory Visit: Payer: Self-pay | Admitting: *Deleted

## 2016-04-22 ENCOUNTER — Other Ambulatory Visit: Payer: Self-pay | Admitting: Nephrology

## 2016-04-22 DIAGNOSIS — N184 Chronic kidney disease, stage 4 (severe): Secondary | ICD-10-CM | POA: Diagnosis not present

## 2016-04-22 DIAGNOSIS — I10 Essential (primary) hypertension: Secondary | ICD-10-CM | POA: Diagnosis not present

## 2016-04-23 DIAGNOSIS — N184 Chronic kidney disease, stage 4 (severe): Secondary | ICD-10-CM | POA: Diagnosis not present

## 2016-04-26 ENCOUNTER — Ambulatory Visit
Admission: RE | Admit: 2016-04-26 | Discharge: 2016-04-26 | Disposition: A | Discharge status: Home / Self Care | Payer: Medicare Other | Source: Ambulatory Visit | Attending: Nephrology | Admitting: Nephrology

## 2016-04-26 DIAGNOSIS — N189 Chronic kidney disease, unspecified: Secondary | ICD-10-CM | POA: Diagnosis not present

## 2016-04-26 DIAGNOSIS — N184 Chronic kidney disease, stage 4 (severe): Secondary | ICD-10-CM

## 2016-05-11 ENCOUNTER — Non-Acute Institutional Stay (SKILLED_NURSING_FACILITY): Payer: Medicare Other | Admitting: Nurse Practitioner

## 2016-05-11 ENCOUNTER — Encounter: Payer: Self-pay | Admitting: Nurse Practitioner

## 2016-05-11 DIAGNOSIS — J329 Chronic sinusitis, unspecified: Secondary | ICD-10-CM | POA: Diagnosis not present

## 2016-05-11 DIAGNOSIS — K219 Gastro-esophageal reflux disease without esophagitis: Secondary | ICD-10-CM | POA: Diagnosis not present

## 2016-05-11 DIAGNOSIS — M5440 Lumbago with sciatica, unspecified side: Secondary | ICD-10-CM | POA: Diagnosis not present

## 2016-05-11 DIAGNOSIS — D631 Anemia in chronic kidney disease: Secondary | ICD-10-CM

## 2016-05-11 DIAGNOSIS — I129 Hypertensive chronic kidney disease with stage 1 through stage 4 chronic kidney disease, or unspecified chronic kidney disease: Secondary | ICD-10-CM

## 2016-05-11 DIAGNOSIS — E871 Hypo-osmolality and hyponatremia: Secondary | ICD-10-CM | POA: Diagnosis not present

## 2016-05-11 DIAGNOSIS — F320 Major depressive disorder, single episode, mild: Secondary | ICD-10-CM

## 2016-05-11 DIAGNOSIS — R040 Epistaxis: Secondary | ICD-10-CM | POA: Insufficient documentation

## 2016-05-11 DIAGNOSIS — K5900 Constipation, unspecified: Secondary | ICD-10-CM | POA: Diagnosis not present

## 2016-05-11 DIAGNOSIS — F0151 Vascular dementia with behavioral disturbance: Secondary | ICD-10-CM

## 2016-05-11 DIAGNOSIS — N183 Chronic kidney disease, stage 3 unspecified: Secondary | ICD-10-CM

## 2016-05-11 DIAGNOSIS — N400 Enlarged prostate without lower urinary tract symptoms: Secondary | ICD-10-CM | POA: Diagnosis not present

## 2016-05-11 DIAGNOSIS — N184 Chronic kidney disease, stage 4 (severe): Secondary | ICD-10-CM

## 2016-05-11 DIAGNOSIS — F01518 Vascular dementia, unspecified severity, with other behavioral disturbance: Secondary | ICD-10-CM

## 2016-05-11 DIAGNOSIS — F5101 Primary insomnia: Secondary | ICD-10-CM

## 2016-05-11 DIAGNOSIS — I10 Essential (primary) hypertension: Secondary | ICD-10-CM

## 2016-05-11 NOTE — Assessment & Plan Note (Signed)
Last urinary retention, Foley inserted 11/04/12 at ED-successfully removed--continued Tamsulosin and Finasteride

## 2016-05-11 NOTE — Assessment & Plan Note (Signed)
Stable, off Omeprazole, Zantac.

## 2016-05-11 NOTE — Assessment & Plan Note (Addendum)
Apply ABT ointment to R+L nostrils bid x 2 weeks, observe. Update CBC CMP

## 2016-05-11 NOTE — Assessment & Plan Note (Signed)
Stable, continue Flonase.  

## 2016-05-11 NOTE — Assessment & Plan Note (Signed)
Stable, continue Senokot S II qhs and MOM qd prn, Doculax suppository prn.

## 2016-05-11 NOTE — Progress Notes (Signed)
Location:  [place] Room Number: 4 Place of Service:  SNF (31) Provider:  Mast, Manxie NP  Jeanmarie Hubert, MD  Patient Care Team: Estill Dooms, MD as PCP - General (Internal Medicine) Man Otho Darner, NP as Nurse Practitioner (Internal Medicine)  Extended Emergency Contact Information Primary Emergency Contact: Phillis Haggis [address] [address] of [place] Phone: [phone] Relation: Nephew  Code Status:  DNR Goals of care: Advanced Directive information Advanced Directives 05/11/2016  Does Patient Have a Medical Advance Directive? Yes  Type of Paramedic of [place];Living will;Out of facility DNR (pink MOST or yellow form)  Does patient want to make changes to medical advance directive? No - Patient declined  Copy of [place] in Chart? Yes  Pre-existing out of facility DNR order (yellow form or pink MOST form) Yellow form placed in chart (order not valid for inpatient use)     Chief Complaint  Patient presents with  . Medical Management of Chronic Issues    HPI:  Pt is a 83 y.o. male seen today for medical management of chronic diseases.    Right noes bleed, small amount, stopped with pressure and tissue pack, dry blood seen @ lateral of the right nostril.   Hx of HTN controlled while on Amlodipine 5mg , GERD is stable, off Zantac, Omeprazole, dementia has been stabilized on Namenda, resides in SNF for care needs. Hx of chronic kidney disease with baseline creat 2s. He is forgetful, but able to voice his needs while on Namenda Hx of BPH with recurrent urinary retention is the past, taking Tamsulosin and Finasteride, sleeps and eats well on Mirtazapine and Effexor.   Past Medical History:  Diagnosis Date  . Anemia   . Anxiety   . Back pain   . Cataract   . Chronic kidney disease 2013   rhabdo  . CKD (chronic kidney disease) stage 3, GFR 30-59 ml/min 08/29/2012   Creatinine 1.88  07/05/13(baseline 1.5-2.0) 02/11/14 creatinine 2.00 05/16/14 creatinine 2.31 07/18/14 creatinine 2.22     . CVA (cerebral infarction)   . Cystic disease of liver   . Dementia with behavioral disturbance 06/06/2012  . Depression 06/06/2012  . Diverticulosis   . Edema   . Gait disorder   . GERD (gastroesophageal reflux disease)   . H/O hiatal hernia 2008   repaired  . Hearing loss   . Hearing loss   . Hypertension   . Hyponatremia   . Inguinal hernia   . Keratosis   . Paresthesia   . Parkinsonism ([place])   . PVC (premature ventricular contraction)   . Sciatica   . Sciatica   . Stroke [facility]) 2010   no deficits  . Thyroid disease   . Vitamin D deficiency disease    Past Surgical History:  Procedure Laterality Date  . APPENDECTOMY    . ESOPHAGOGASTRODUODENOSCOPY  10/25/2011   Procedure: ESOPHAGOGASTRODUODENOSCOPY (EGD);  Surgeon: Winfield Cunas., MD;  Location: [facility] ENDOSCOPY;  Service: Endoscopy;  Laterality: N/A;  . HERNIA REPAIR    . PROSTATE SURGERY     cancer    Allergies  Allergen Reactions  . Olanzapine Other (See Comments)    unknown  . Rivastigmine Nausea Only    Allergies as of 05/11/2016      Reactions   Olanzapine Other (See Comments)   unknown   Rivastigmine Nausea Only      Medication List  Accurate as of 05/11/16  4:18 PM. Always use your most recent med list.          acetaminophen 500 MG tablet Commonly known as:  TYLENOL Take 500 mg by mouth at bedtime.   acetaminophen 325 MG tablet Commonly known as:  TYLENOL Take 650 mg by mouth every 4 (four) hours as needed.   amLODipine 5 MG tablet Commonly known as:  NORVASC Take 5 mg by mouth. Take one tablet daily for blood pressure   bisacodyl 10 MG suppository Commonly known as:  DULCOLAX Place 10 mg rectally daily as needed for moderate constipation.   finasteride 5 MG tablet Commonly known as:  PROSCAR Take 5 mg by mouth daily.   Fish Oil 1000 MG Caps Take 2 capsules by mouth daily.     fluticasone 50 MCG/ACT nasal spray Commonly known as:  FLONASE Place 1 spray into both nostrils daily.   loteprednol 0.5 % ophthalmic suspension Commonly known as:  LOTEMAX Place 1 drop into the left eye daily.   magnesium hydroxide 400 MG/5ML suspension Commonly known as:  MILK OF MAGNESIA Take 30 mLs by mouth daily as needed for mild constipation.   Melatonin 3 MG Tabs Take 3 mg by mouth at bedtime.   mirtazapine 7.5 MG tablet Commonly known as:  REMERON Take 7.5 mg by mouth. Take one tablet at bedtime   NAMENDA XR 28 MG Cp24 24 hr capsule Generic drug:  memantine Take 28 mg by mouth daily. Take one daily for memory   PREVIDENT 5000 PLUS 1.1 % Crea dental cream Generic drug:  sodium fluoride Place 1 application onto teeth every evening.   RESOURCE BREEZE PO Take by mouth. 120 ml three times a day, nutritional supplement   senna 8.6 MG tablet Commonly known as:  SENOKOT Take 2 tablets by mouth. Take 2 tablets once daily   sodium chloride 5 % ophthalmic ointment Commonly known as:  MURO 161 Place 1 application into the left eye. Apply to left eye at bedtime   sodium chloride 2 % ophthalmic solution Commonly known as:  MURO 128 Place 1 drop into the left eye. One drop in left eye three times a day   SYSTANE 0.4-0.3 % Soln Generic drug:  Polyethyl Glycol-Propyl Glycol Place 1 drop into both eyes 3 (three) times daily.   tamsulosin 0.4 MG Caps capsule Commonly known as:  FLOMAX Take 0.4 mg by mouth every morning.   venlafaxine 75 MG tablet Commonly known as:  EFFEXOR Take 75 mg by mouth daily.   Vitamin D-3 1000 units Caps Take by mouth. Take one capsule daily       Review of Systems  Constitutional: Negative for chills, diaphoresis, fever and unexpected weight change.  HENT: Positive for hearing loss, nosebleeds, sinus pressure and voice change (hoarse). Negative for congestion, ear discharge, ear pain, postnasal drip, rhinorrhea, sore throat and  tinnitus.        Nasal congestion at night. R nose bleed, stopped by pressure.   Eyes: Negative for photophobia, pain, discharge and redness.  Respiratory: Negative for cough, shortness of breath, wheezing and stridor.   Cardiovascular: Negative for chest pain, palpitations and leg swelling.  Gastrointestinal: Negative for abdominal pain, blood in stool, constipation, diarrhea, nausea and vomiting.  Endocrine: Negative for polydipsia.  Genitourinary: Positive for frequency. Negative for dysuria, flank pain, hematuria and urgency.  Musculoskeletal: Positive for back pain and gait problem. Negative for myalgias and neck pain.  Skin: Negative for rash.  Allergic/Immunologic: Negative  for environmental allergies.  Neurological: Negative for dizziness, tremors, seizures, weakness and headaches.  Hematological: Does not bruise/bleed easily.  Psychiatric/Behavioral: Positive for sleep disturbance. Negative for hallucinations and suicidal ideas. The patient is not nervous/anxious.     Immunization History  Administered Date(s) Administered  . DTaP 10/31/2011  . Influenza Whole 12/20/2012  . Influenza-Unspecified 12/06/2013, 12/06/2014, 12/11/2015  . Pneumococcal-Unspecified 10/31/2011  . Tdap 10/24/2011   Pertinent  Health Maintenance Due  Topic Date Due  . PNA vac Low Risk Adult (2 of 2 - PCV13) 10/30/2012  . INFLUENZA VACCINE  Completed   Fall Risk  01/12/2016 09/12/2014 05/23/2014  Falls in the past year? Yes Yes No  Number falls in past yr: 1 1 -  Injury with Fall? Yes No -  Risk for fall due to : - - History of fall(s);Impaired balance/gait   Functional Status Survey:    Vitals:   05/11/16 1452  BP: (!) 164/94  Pulse: 76  Resp: 18  Temp: 97.6 F (36.4 C)  SpO2: 93%  Weight: 133 lb (60.3 kg)  Height: 5\' 3"  (1.6 m)   Body mass index is 23.56 kg/m. Physical Exam  Constitutional: He appears well-developed and well-nourished.  HENT:  Head: Normocephalic and atraumatic. Not  macrocephalic.  Nose: No mucosal edema or rhinorrhea. Right sinus exhibits no maxillary sinus tenderness and no frontal sinus tenderness. Left sinus exhibits no maxillary sinus tenderness and no frontal sinus tenderness.  Mouth/Throat: Uvula is midline. Posterior oropharyngeal erythema present. No oropharyngeal exudate or posterior oropharyngeal edema.  Lateral right nostril dry blood seen.   Eyes: EOM are normal. Pupils are equal, round, and reactive to light.  Neck: Normal range of motion. Neck supple. No JVD present. No thyromegaly present.  Cardiovascular: Normal rate and regular rhythm.   Murmur heard.  Systolic murmur is present with a grade of 2/6  Pulmonary/Chest: Effort normal and breath sounds normal. He has no wheezes. He has no rales.  Abdominal: Soft. Bowel sounds are normal. There is no tenderness.  Musculoskeletal: Normal range of motion. He exhibits tenderness. He exhibits no edema.  Scoliokyphosis, walking with walker with SBA. Chronic back pain. 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  Lymphadenopathy:    He has no cervical adenopathy.  Neurological: He is alert. He displays normal reflexes. No cranial nerve deficit. He exhibits normal muscle tone. Coordination normal.  Mumbling and incoherent answers sometimes.  Skin: Skin is warm and dry. No rash noted. No erythema.  AK top of head  Psychiatric: His mood appears not anxious. His affect is not angry, not blunt and not labile. His speech is not delayed and not slurred. He is not agitated, not aggressive, not slowed, not withdrawn, not actively hallucinating and not combative. Thought content is not paranoid and not delusional. Cognition and memory are impaired. He expresses impulsivity. He does not exhibit a depressed mood. He exhibits abnormal recent memory. He is attentive.    Labs reviewed:  Recent Labs  12/15/15 03/15/16 04/15/16  NA 133* 135* 138  K 3.7 4.2 4.5  BUN 33* 54* 61*  CREATININE 2.7* 3.3* 3.3*      Recent Labs  06/12/15 12/06/15 12/08/15  AST 12* 18 15  ALT 8* 11 11  ALKPHOS 76 67 77    Recent Labs  06/12/15 12/06/15 12/08/15  WBC 9.1 5.9 7.6  HGB 10.1* 10.1* 10.0*  HCT 31* 30* 29*  PLT 204 216 214   Lab Results  Component Value Date  TSH 1.09 06/12/2015   Lab Results  Component Value Date   HGBA1C 5.3 06/12/2015   No results found for: CHOL, HDL, LDLCALC, LDLDIRECT, TRIG, CHOLHDL  Significant Diagnostic Results in last 30 days:  US Renal  Result Date: 04/26/2016 CLINICAL DATA:  Chronic renal disease. EXAM: RENAL / URINARY TRACT ULTRASOUND COMPLETE COMPARISON:  No prior . FINDINGS: Right Kidney: Length: 6.5 cm. Renal cortical thinning. Increase echogenicity. No hydronephrosis visualized. 1.2 cm simple cyst lower pole. Left Kidney: Length: 8.0 cm. Renal cortical thinning. Increase echogenicity. No hydronephrosis visualized. 7.4 cm simple cyst lower pole. Bladder: Appears normal for degree of bladder distention. IMPRESSION: 1. Bilateral renal cortical thinning consistent with atrophy. Bilateral increased renal echogenicity consistent chronic medical renal disease. 2. No acute abnormality.  No hydronephrosis . Electronically Signed   By: Marcello Moores  Register   On: 04/26/2016 15:25    Assessment/Plan Epistaxis Apply ABT ointment to R+L nostrils bid x 2 weeks, observe. Update CBC CMP  Hypertension Controlled, continue Amlodipine 5mg , 04/15/16 Na 138, K 4.5, Bun 61, creat 3.31  CKD stage 4 secondary to hypertension ([place]) 04/15/16 Na 138, K 4.5, Bun 61, creat 3.31, 04/22/16 nephrology stage IV renal disease.   Sinusitis, chronic Stable, continue Flonase  GERD (gastroesophageal reflux disease) Stable, off Omeprazole, Zantac.   Constipation Stable, continue Senokot S II qhs and MOM qd prn, Doculax suppository prn.   Dementia with behavioral disturbance Functioning well in SNF, stays in his room most of time, ambulates with walker, continue Namenda  Anemia in chronic  kidney disease 12/08/15 Hgb 10.1, update CBC  BPH (benign prostatic hyperplasia) Last urinary retention, Foley inserted 11/04/12 at ED-successfully removed--continued Tamsulosin and Finasteride   Back pain Severe kyphosis, only walks a few steps  Depression Mood is stable, continue Effexor 75mg  and Mirtazapine 7.5mg  qhs due to relapsed insomnia(off it since 11/18/15).  Sleeps well at night  Hyponatremia 03/15/16 Na 135, 03/15/16 Na 138  Insomnia Sleeps well, continue Effexor and Mirtazapine.      Family/ staff Communication: SNF  Labs/tests ordered:  CBC CMP

## 2016-05-11 NOTE — Assessment & Plan Note (Signed)
Mood is stable, continue Effexor 75mg  and Mirtazapine 7.5mg  qhs due to relapsed insomnia(off it since 11/18/15).  Sleeps well at night

## 2016-05-11 NOTE — Assessment & Plan Note (Signed)
Functioning well in SNF, stays in his room most of time, ambulates with walker, continue Namenda

## 2016-05-11 NOTE — Assessment & Plan Note (Addendum)
04/15/16 Na 138, K 4.5, Bun 61, creat 3.31, 04/22/16 nephrology stage IV renal disease.

## 2016-05-11 NOTE — Assessment & Plan Note (Addendum)
Controlled, continue Amlodipine 5mg , 04/15/16 Na 138, K 4.5, Bun 61, creat 3.31

## 2016-05-11 NOTE — Assessment & Plan Note (Signed)
Sleeps well, continue Effexor and Mirtazapine.

## 2016-05-11 NOTE — Assessment & Plan Note (Signed)
12/08/15 Hgb 10.1, update CBC

## 2016-05-11 NOTE — Assessment & Plan Note (Signed)
03/15/16 Na 135, 03/15/16 Na 138

## 2016-05-11 NOTE — Assessment & Plan Note (Signed)
Severe kyphosis, only walks a few steps

## 2016-05-13 DIAGNOSIS — I10 Essential (primary) hypertension: Secondary | ICD-10-CM | POA: Diagnosis not present

## 2016-05-13 DIAGNOSIS — D649 Anemia, unspecified: Secondary | ICD-10-CM | POA: Diagnosis not present

## 2016-05-13 LAB — CBC AND DIFFERENTIAL
HCT: 29 % — AB (ref 41–53)
HEMOGLOBIN: 9.3 g/dL — AB (ref 13.5–17.5)
Platelets: 211 10*3/uL (ref 150–399)
WBC: 7 10*3/mL

## 2016-05-13 LAB — BASIC METABOLIC PANEL
BUN: 69 mg/dL — AB (ref 4–21)
CREATININE: 3.4 mg/dL — AB (ref <=1.3)
GLUCOSE: 78 mg/dL
POTASSIUM: 4.5 mmol/L (ref 3.4–5.3)
SODIUM: 134 mmol/L — AB (ref 137–147)

## 2016-05-13 LAB — HEPATIC FUNCTION PANEL
ALT: 10 U/L (ref 10–40)
AST: 11 U/L — AB (ref 14–40)
Alkaline Phosphatase: 81 U/L (ref 25–125)
Bilirubin, Total: 0.4 mg/dL

## 2016-05-14 ENCOUNTER — Other Ambulatory Visit: Payer: Self-pay | Admitting: *Deleted

## 2016-06-01 DIAGNOSIS — I10 Essential (primary) hypertension: Secondary | ICD-10-CM | POA: Diagnosis not present

## 2016-06-01 DIAGNOSIS — D631 Anemia in chronic kidney disease: Secondary | ICD-10-CM | POA: Diagnosis not present

## 2016-06-01 DIAGNOSIS — N184 Chronic kidney disease, stage 4 (severe): Secondary | ICD-10-CM | POA: Diagnosis not present

## 2016-06-11 ENCOUNTER — Encounter: Payer: Self-pay | Admitting: Nurse Practitioner

## 2016-06-11 ENCOUNTER — Non-Acute Institutional Stay (SKILLED_NURSING_FACILITY): Payer: Medicare Other | Admitting: Nurse Practitioner

## 2016-06-11 DIAGNOSIS — N183 Chronic kidney disease, stage 3 (moderate): Secondary | ICD-10-CM | POA: Diagnosis not present

## 2016-06-11 DIAGNOSIS — I10 Essential (primary) hypertension: Secondary | ICD-10-CM

## 2016-06-11 DIAGNOSIS — K5900 Constipation, unspecified: Secondary | ICD-10-CM | POA: Diagnosis not present

## 2016-06-11 DIAGNOSIS — F320 Major depressive disorder, single episode, mild: Secondary | ICD-10-CM

## 2016-06-11 DIAGNOSIS — I129 Hypertensive chronic kidney disease with stage 1 through stage 4 chronic kidney disease, or unspecified chronic kidney disease: Secondary | ICD-10-CM | POA: Diagnosis not present

## 2016-06-11 DIAGNOSIS — E871 Hypo-osmolality and hyponatremia: Secondary | ICD-10-CM

## 2016-06-11 DIAGNOSIS — H01006 Unspecified blepharitis left eye, unspecified eyelid: Secondary | ICD-10-CM

## 2016-06-11 DIAGNOSIS — N400 Enlarged prostate without lower urinary tract symptoms: Secondary | ICD-10-CM

## 2016-06-11 DIAGNOSIS — F5101 Primary insomnia: Secondary | ICD-10-CM | POA: Diagnosis not present

## 2016-06-11 DIAGNOSIS — M5440 Lumbago with sciatica, unspecified side: Secondary | ICD-10-CM | POA: Diagnosis not present

## 2016-06-11 DIAGNOSIS — F0151 Vascular dementia with behavioral disturbance: Secondary | ICD-10-CM | POA: Diagnosis not present

## 2016-06-11 DIAGNOSIS — H01003 Unspecified blepharitis right eye, unspecified eyelid: Secondary | ICD-10-CM | POA: Diagnosis not present

## 2016-06-11 DIAGNOSIS — D631 Anemia in chronic kidney disease: Secondary | ICD-10-CM

## 2016-06-11 DIAGNOSIS — K219 Gastro-esophageal reflux disease without esophagitis: Secondary | ICD-10-CM | POA: Diagnosis not present

## 2016-06-11 DIAGNOSIS — F01518 Vascular dementia, unspecified severity, with other behavioral disturbance: Secondary | ICD-10-CM

## 2016-06-11 DIAGNOSIS — N184 Chronic kidney disease, stage 4 (severe): Secondary | ICD-10-CM

## 2016-06-11 NOTE — Assessment & Plan Note (Signed)
Severe kyphosis, only walks a few steps

## 2016-06-11 NOTE — Assessment & Plan Note (Signed)
Stable, off Omeprazole, Zantac.

## 2016-06-11 NOTE — Assessment & Plan Note (Signed)
Last urinary retention, Foley inserted 11/04/12 at ED-successfully removed--continued Tamsulosin and Finasteride

## 2016-06-11 NOTE — Assessment & Plan Note (Addendum)
Controlled, continue Amlodipine 5mg , 05/13/16 Na 134, K 4.5, Bun 69, creat 3.42, wbc 7.0, Hgb 9.3, plt 211

## 2016-06-11 NOTE — Assessment & Plan Note (Signed)
05/13/16 Na 134, K 4.5, Bun 69, creat 3.42, wbc 7.0, Hgb 9.3, plt 211. Last nephrology evaluation 04/22/16

## 2016-06-11 NOTE — Assessment & Plan Note (Signed)
Functioning well in SNF, stays in his room most of time, ambulates with walker, continue Namenda

## 2016-06-11 NOTE — Progress Notes (Signed)
Location:  [place] Room Number: 4 Place of Service:  SNF (31) Provider:  Waylen Depaolo, Manxie  NP  Jeanmarie Hubert, MD  Patient Care Team: Estill Dooms, MD as PCP - General (Internal Medicine) Cerissa Zeiger Otho Darner, NP as Nurse Practitioner (Internal Medicine)  Extended Emergency Contact Information Primary Emergency Contact: Phillis Haggis [address] [address] of [place] Phone: [phone] Relation: Nephew  Code Status:  DNR Goals of care: Advanced Directive information Advanced Directives 06/11/2016  Does Patient Have a Medical Advance Directive? Yes  Type of Paramedic of [place];Living will;Out of facility DNR (pink MOST or yellow form)  Does patient want to make changes to medical advance directive? No - Patient declined  Copy of [place] in Chart? Yes  Pre-existing out of facility DNR order (yellow form or pink MOST form) Yellow form placed in chart (order not valid for inpatient use)     Chief Complaint  Patient presents with  . Medical Management of Chronic Issues    HPI:  Pt is a 83 y.o. male seen today for medical management of chronic diseases.      Hx of HTN controlled while on Amlodipine 5mg , GERD is stable, off Zantac, Omeprazole, dementia has been stabilized on Namenda, resides in SNF for care needs. Hx of chronic kidney disease with baseline creat 2s. He is forgetful, but able to voice his needs while on Namenda Hx of BPH with recurrent urinary retention is the past, taking Tamsulosin and Finasteride, sleeps and eats well on Mirtazapine and Effexor.    Past Medical History:  Diagnosis Date  . Anemia   . Anxiety   . Back pain   . Cataract   . Chronic kidney disease 2013   rhabdo  . CKD (chronic kidney disease) stage 3, GFR 30-59 ml/min 08/29/2012   Creatinine 1.88 07/05/13(baseline 1.5-2.0) 02/11/14 creatinine 2.00 05/16/14 creatinine 2.31 07/18/14 creatinine 2.22     . CVA (cerebral  infarction)   . Cystic disease of liver   . Dementia with behavioral disturbance 06/06/2012  . Depression 06/06/2012  . Diverticulosis   . Edema   . Gait disorder   . GERD (gastroesophageal reflux disease)   . H/O hiatal hernia 2008   repaired  . Hearing loss   . Hearing loss   . Hypertension   . Hyponatremia   . Inguinal hernia   . Keratosis   . Paresthesia   . Parkinsonism ([place])   . PVC (premature ventricular contraction)   . Sciatica   . Sciatica   . Stroke [facility]) 2010   no deficits  . Thyroid disease   . Vitamin D deficiency disease    Past Surgical History:  Procedure Laterality Date  . APPENDECTOMY    . ESOPHAGOGASTRODUODENOSCOPY  10/25/2011   Procedure: ESOPHAGOGASTRODUODENOSCOPY (EGD);  Surgeon: Winfield Cunas., MD;  Location: [facility] ENDOSCOPY;  Service: Endoscopy;  Laterality: N/A;  . HERNIA REPAIR    . PROSTATE SURGERY     cancer    Allergies  Allergen Reactions  . Olanzapine Other (See Comments)    unknown  . Rivastigmine Nausea Only    Outpatient Encounter Prescriptions as of 06/11/2016  Medication Sig  . acetaminophen (TYLENOL) 325 MG tablet Take 650 mg by mouth every 4 (four) hours as needed.  Marland Kitchen acetaminophen (TYLENOL) 500 MG tablet Take 500 mg by mouth at bedtime.   Marland Kitchen amLODipine (NORVASC) 5 MG tablet Take  5 mg by mouth. Take one tablet daily for blood pressure  . bisacodyl (DULCOLAX) 10 MG suppository Place 10 mg rectally daily as needed for moderate constipation.  . Cholecalciferol (VITAMIN D-3) 1000 units CAPS Take by mouth. Take one capsule daily  . finasteride (PROSCAR) 5 MG tablet Take 5 mg by mouth daily.  . fluticasone (FLONASE) 50 MCG/ACT nasal spray Place 1 spray into both nostrils daily.  Marland Kitchen loteprednol (LOTEMAX) 0.5 % ophthalmic suspension Place 1 drop into the left eye daily.  . magnesium hydroxide (MILK OF MAGNESIA) 400 MG/5ML suspension Take 30 mLs by mouth daily as needed for mild constipation.   . Melatonin 3 MG TABS Take 3 mg by mouth at  bedtime.   . Memantine HCl ER (NAMENDA XR) 28 MG CP24 Take 28 mg by mouth daily. Take one daily for memory  . mirtazapine (REMERON) 7.5 MG tablet Take 7.5 mg by mouth. Take one tablet at bedtime  . Nutritional Supplements (RESOURCE BREEZE PO) Take by mouth. 120 ml three times a day, nutritional supplement  . Omega-3 Fatty Acids (FISH OIL) 1000 MG CAPS Take 2 capsules by mouth daily.  Vladimir Faster Glycol-Propyl Glycol (SYSTANE) 0.4-0.3 % SOLN Place 1 drop into both eyes 3 (three) times daily.  Marland Kitchen senna (SENOKOT) 8.6 MG tablet Take 2 tablets by mouth. Take 2 tablets once daily  . sodium chloride (MURO 128) 2 % ophthalmic solution Place 1 drop into the left eye. One drop in left eye three times a day  . sodium chloride (MURO 128) 5 % ophthalmic ointment Place 1 application into the left eye. Apply to left eye at bedtime  . sodium fluoride (PREVIDENT 5000 PLUS) 1.1 % CREA dental cream Place 1 application onto teeth every evening.  . Tamsulosin HCl (FLOMAX) 0.4 MG CAPS Take 0.4 mg by mouth every morning.   . venlafaxine (EFFEXOR) 75 MG tablet Take 75 mg by mouth daily.   No facility-administered encounter medications on file as of 06/11/2016.     Review of Systems  Constitutional: Negative for chills, diaphoresis, fever and unexpected weight change.  HENT: Positive for hearing loss and voice change (hoarse). Negative for congestion, ear discharge, ear pain, nosebleeds, postnasal drip, rhinorrhea, sinus pressure, sore throat and tinnitus.        Nasal congestion at night. R nose bleed, stopped by pressure.   Eyes: Negative for photophobia, pain, discharge and redness.       Crusty eyelashes.   Respiratory: Negative for cough, shortness of breath, wheezing and stridor.   Cardiovascular: Negative for chest pain, palpitations and leg swelling.  Gastrointestinal: Negative for abdominal pain, blood in stool, constipation, diarrhea, nausea and vomiting.  Endocrine: Negative for polydipsia.  Genitourinary:  Positive for frequency. Negative for dysuria, flank pain, hematuria and urgency.  Musculoskeletal: Positive for back pain and gait problem. Negative for myalgias and neck pain.  Skin: Negative for rash.  Allergic/Immunologic: Negative for environmental allergies.  Neurological: Negative for dizziness, tremors, seizures, weakness and headaches.  Hematological: Does not bruise/bleed easily.  Psychiatric/Behavioral: Positive for sleep disturbance. Negative for hallucinations and suicidal ideas. The patient is not nervous/anxious.     Immunization History  Administered Date(s) Administered  . DTaP 10/31/2011  . Influenza Whole 12/20/2012  . Influenza-Unspecified 12/06/2013, 12/06/2014, 12/11/2015  . Pneumococcal-Unspecified 10/31/2011  . Tdap 10/24/2011   Pertinent  Health Maintenance Due  Topic Date Due  . PNA vac Low Risk Adult (2 of 2 - PCV13) 10/30/2012  . INFLUENZA VACCINE  09/29/2016  Fall Risk  01/12/2016 09/12/2014 05/23/2014  Falls in the past year? Yes Yes No  Number falls in past yr: 1 1 -  Injury with Fall? Yes No -  Risk for fall due to : - - History of fall(s);Impaired balance/gait   Functional Status Survey:    Vitals:   06/11/16 0935  BP: (!) 167/86  Pulse: 74  Resp: 14  Temp: 97.4 F (36.3 C)  SpO2: 99%  Weight: 132 lb (59.9 kg)  Height: 5\' 3"  (1.6 m)   Body mass index is 23.38 kg/m. Physical Exam  Constitutional: He appears well-developed and well-nourished.  HENT:  Head: Normocephalic and atraumatic. Not macrocephalic.  Nose: No mucosal edema or rhinorrhea. Right sinus exhibits no maxillary sinus tenderness and no frontal sinus tenderness. Left sinus exhibits no maxillary sinus tenderness and no frontal sinus tenderness.  Mouth/Throat: Uvula is midline. Posterior oropharyngeal erythema present. No oropharyngeal exudate or posterior oropharyngeal edema.  Lateral right nostril dry blood seen.   Eyes: EOM are normal. Pupils are equal, round, and reactive  to light.  Neck: Normal range of motion. Neck supple. No JVD present. No thyromegaly present.  Cardiovascular: Normal rate and regular rhythm.   Murmur heard.  Systolic murmur is present with a grade of 2/6  Pulmonary/Chest: Effort normal and breath sounds normal. He has no wheezes. He has no rales.  Abdominal: Soft. Bowel sounds are normal. There is no tenderness.  Musculoskeletal: Normal range of motion. He exhibits tenderness. He exhibits no edema.  Scoliokyphosis, walking with walker with SBA. Chronic back pain. 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  Lymphadenopathy:    He has no cervical adenopathy.  Neurological: He is alert. He displays normal reflexes. No cranial nerve deficit. He exhibits normal muscle tone. Coordination normal.  Mumbling and incoherent answers sometimes.  Skin: Skin is warm and dry. No rash noted. No erythema.  AK top of head  Psychiatric: His mood appears not anxious. His affect is not angry, not blunt and not labile. His speech is not delayed and not slurred. He is not agitated, not aggressive, not slowed, not withdrawn, not actively hallucinating and not combative. Thought content is not paranoid and not delusional. Cognition and memory are impaired. He expresses impulsivity. He does not exhibit a depressed mood. He exhibits abnormal recent memory. He is attentive.    Labs reviewed:  Recent Labs  03/15/16 04/15/16 05/13/16  NA 135* 138 134*  K 4.2 4.5 4.5  BUN 54* 61* 69*  CREATININE 3.3* 3.3* 3.4*    Recent Labs  12/06/15 12/08/15 05/13/16  AST 18 15 11*  ALT 11 11 10   ALKPHOS 67 77 81    Recent Labs  12/06/15 12/08/15 05/13/16  WBC 5.9 7.6 7.0  HGB 10.1* 10.0* 9.3*  HCT 30* 29* 29*  PLT 216 214 211   Lab Results  Component Value Date   TSH 1.09 06/12/2015   Lab Results  Component Value Date   HGBA1C 5.3 06/12/2015   No results found for: CHOL, HDL, LDLCALC, LDLDIRECT, TRIG, CHOLHDL  Significant Diagnostic Results in last  30 days:  No results found.  Assessment/Plan Hypertension Controlled, continue Amlodipine 5mg , 05/13/16 Na 134, K 4.5, Bun 69, creat 3.42, wbc 7.0, Hgb 9.3, plt 211  GERD (gastroesophageal reflux disease) Stable, off Omeprazole, Zantac.   Constipation Stable, continue Senokot S II qhs and MOM qd prn, Doculax suppository prn.   Dementia with behavioral disturbance Functioning well in SNF, stays in his room most  of time, ambulates with walker, continue Namenda  Anemia in chronic kidney disease 05/13/16 Na 134, K 4.5, Bun 69, creat 3.42, wbc 7.0, Hgb 9.3, plt 211  BPH (benign prostatic hyperplasia) Last urinary retention, Foley inserted 11/04/12 at ED-successfully removed--continued Tamsulosin and Finasteride   CKD stage 4 secondary to hypertension ([place]) 05/13/16 Na 134, K 4.5, Bun 69, creat 3.42, wbc 7.0, Hgb 9.3, plt 211. Last nephrology evaluation 04/22/16  Back pain Severe kyphosis, only walks a few steps  Depression Mood is stable, continue Effexor 75mg  and Mirtazapine 7.5mg  qhs due to relapsed insomnia(off it since 11/18/15).  Sleeps well at night  Hyponatremia Last Na 134 05/13/16  Insomnia Sleeps well, continue Effexor and Mirtazapine.   Blepharitis of both eyes Warm wash to cleanse eyelids bid     Family/ staff Communication: SNF  Labs/tests ordered:  none

## 2016-06-11 NOTE — Assessment & Plan Note (Signed)
Sleeps well, continue Effexor and Mirtazapine.

## 2016-06-11 NOTE — Assessment & Plan Note (Signed)
Mood is stable, continue Effexor 75mg  and Mirtazapine 7.5mg  qhs due to relapsed insomnia(off it since 11/18/15).  Sleeps well at night

## 2016-06-11 NOTE — Assessment & Plan Note (Signed)
Last Na 134 05/13/16

## 2016-06-11 NOTE — Assessment & Plan Note (Signed)
Stable, continue Senokot S II qhs and MOM qd prn, Doculax suppository prn.

## 2016-06-11 NOTE — Assessment & Plan Note (Signed)
Warm wash to cleanse eyelids bid

## 2016-06-11 NOTE — Assessment & Plan Note (Signed)
05/13/16 Na 134, K 4.5, Bun 69, creat 3.42, wbc 7.0, Hgb 9.3, plt 211

## 2016-06-15 ENCOUNTER — Non-Acute Institutional Stay (SKILLED_NURSING_FACILITY): Payer: Medicare Other | Admitting: Nurse Practitioner

## 2016-06-15 DIAGNOSIS — F320 Major depressive disorder, single episode, mild: Secondary | ICD-10-CM

## 2016-06-15 DIAGNOSIS — F0151 Vascular dementia with behavioral disturbance: Secondary | ICD-10-CM

## 2016-06-15 DIAGNOSIS — K5900 Constipation, unspecified: Secondary | ICD-10-CM | POA: Diagnosis not present

## 2016-06-15 DIAGNOSIS — H01003 Unspecified blepharitis right eye, unspecified eyelid: Secondary | ICD-10-CM | POA: Diagnosis not present

## 2016-06-15 DIAGNOSIS — I129 Hypertensive chronic kidney disease with stage 1 through stage 4 chronic kidney disease, or unspecified chronic kidney disease: Secondary | ICD-10-CM | POA: Diagnosis not present

## 2016-06-15 DIAGNOSIS — E871 Hypo-osmolality and hyponatremia: Secondary | ICD-10-CM | POA: Diagnosis not present

## 2016-06-15 DIAGNOSIS — N184 Chronic kidney disease, stage 4 (severe): Secondary | ICD-10-CM

## 2016-06-15 DIAGNOSIS — F01518 Vascular dementia, unspecified severity, with other behavioral disturbance: Secondary | ICD-10-CM

## 2016-06-15 DIAGNOSIS — D631 Anemia in chronic kidney disease: Secondary | ICD-10-CM

## 2016-06-15 DIAGNOSIS — M5440 Lumbago with sciatica, unspecified side: Secondary | ICD-10-CM | POA: Diagnosis not present

## 2016-06-15 DIAGNOSIS — H60502 Unspecified acute noninfective otitis externa, left ear: Secondary | ICD-10-CM

## 2016-06-15 DIAGNOSIS — I10 Essential (primary) hypertension: Secondary | ICD-10-CM

## 2016-06-15 DIAGNOSIS — N183 Chronic kidney disease, stage 3 unspecified: Secondary | ICD-10-CM

## 2016-06-15 DIAGNOSIS — H01006 Unspecified blepharitis left eye, unspecified eyelid: Secondary | ICD-10-CM

## 2016-06-15 DIAGNOSIS — N400 Enlarged prostate without lower urinary tract symptoms: Secondary | ICD-10-CM | POA: Diagnosis not present

## 2016-06-15 DIAGNOSIS — K219 Gastro-esophageal reflux disease without esophagitis: Secondary | ICD-10-CM | POA: Diagnosis not present

## 2016-06-15 DIAGNOSIS — H6090 Unspecified otitis externa, unspecified ear: Secondary | ICD-10-CM | POA: Insufficient documentation

## 2016-06-15 NOTE — Progress Notes (Signed)
Location:  [place] Room Number: 4 Place of Service:  SNF (31) Provider:  Mast, Manxie  NP  Jeanmarie Hubert, MD  Patient Care Team: Estill Dooms, MD as PCP - General (Internal Medicine) Man Otho Darner, NP as Nurse Practitioner (Internal Medicine)  Extended Emergency Contact Information Primary Emergency Contact: Phillis Haggis [address] [address] of [place] Phone: [phone] Relation: Nephew  Code Status:  DNR Goals of care: Advanced Directive information Advanced Directives 06/11/2016  Does Patient Have a Medical Advance Directive? Yes  Type of Paramedic of [place];Living will;Out of facility DNR (pink MOST or yellow form)  Does patient want to make changes to medical advance directive? No - Patient declined  Copy of [place] in Chart? Yes  Pre-existing out of facility DNR order (yellow form or pink MOST form) Yellow form placed in chart (order not valid for inpatient use)     Chief Complaint  Patient presents with  . Acute Visit    clear smelly drainage from (L) ear,     HPI:  Pt is a 83 y.o. male seen today for left ear odorous drainage, no pain when tragus pressed and auricle was manipulated. Otoscope exam reveal puss and irritated from external ear canal, sensitive during when examined.     Hx of HTN controlled while on Amlodipine 5mg , GERD is stable, off Zantac, Omeprazole, dementia has been stabilized on Namenda, resides in SNF for care needs. Hx of chronic kidney disease with baseline creat 2s. He is forgetful, but able to voice his needs while on Namenda Hx of BPH with recurrent urinary retention is the past, taking Tamsulosin and Finasteride, sleeps and eats well on Mirtazapine and Effexor.    Past Medical History:  Diagnosis Date  . Anemia   . Anxiety   . Back pain   . Cataract   . Chronic kidney disease 2013   rhabdo  . CKD (chronic kidney disease) stage 3, GFR 30-59  ml/min 08/29/2012   Creatinine 1.88 07/05/13(baseline 1.5-2.0) 02/11/14 creatinine 2.00 05/16/14 creatinine 2.31 07/18/14 creatinine 2.22     . CVA (cerebral infarction)   . Cystic disease of liver   . Dementia with behavioral disturbance 06/06/2012  . Depression 06/06/2012  . Diverticulosis   . Edema   . Gait disorder   . GERD (gastroesophageal reflux disease)   . H/O hiatal hernia 2008   repaired  . Hearing loss   . Hearing loss   . Hypertension   . Hyponatremia   . Inguinal hernia   . Keratosis   . Paresthesia   . Parkinsonism ([place])   . PVC (premature ventricular contraction)   . Sciatica   . Sciatica   . Stroke [facility]) 2010   no deficits  . Thyroid disease   . Vitamin D deficiency disease    Past Surgical History:  Procedure Laterality Date  . APPENDECTOMY    . ESOPHAGOGASTRODUODENOSCOPY  10/25/2011   Procedure: ESOPHAGOGASTRODUODENOSCOPY (EGD);  Surgeon: Winfield Cunas., MD;  Location: [facility] ENDOSCOPY;  Service: Endoscopy;  Laterality: N/A;  . HERNIA REPAIR    . PROSTATE SURGERY     cancer    Allergies  Allergen Reactions  . Olanzapine Other (See Comments)    unknown  . Rivastigmine Nausea Only    Outpatient Encounter Prescriptions as of 06/15/2016  Medication Sig  . acetaminophen (TYLENOL) 325 MG tablet Take 650 mg by mouth every  4 (four) hours as needed.  Marland Kitchen acetaminophen (TYLENOL) 500 MG tablet Take 500 mg by mouth at bedtime.   Marland Kitchen amLODipine (NORVASC) 5 MG tablet Take 5 mg by mouth. Take one tablet daily for blood pressure  . bisacodyl (DULCOLAX) 10 MG suppository Place 10 mg rectally daily as needed for moderate constipation.  . Cholecalciferol (VITAMIN D-3) 1000 units CAPS Take by mouth. Take one capsule daily  . finasteride (PROSCAR) 5 MG tablet Take 5 mg by mouth daily.  . fluticasone (FLONASE) 50 MCG/ACT nasal spray Place 1 spray into both nostrils daily.  Marland Kitchen loteprednol (LOTEMAX) 0.5 % ophthalmic suspension Place 1 drop into the left eye daily.  . magnesium  hydroxide (MILK OF MAGNESIA) 400 MG/5ML suspension Take 30 mLs by mouth daily as needed for mild constipation.   . Melatonin 3 MG TABS Take 3 mg by mouth at bedtime.   . Memantine HCl ER (NAMENDA XR) 28 MG CP24 Take 28 mg by mouth daily. Take one daily for memory  . mirtazapine (REMERON) 7.5 MG tablet Take 7.5 mg by mouth. Take one tablet at bedtime  . Nutritional Supplements (RESOURCE BREEZE PO) Take by mouth. 120 ml three times a day, nutritional supplement  . Omega-3 Fatty Acids (FISH OIL) 1000 MG CAPS Take 2 capsules by mouth daily.  Vladimir Faster Glycol-Propyl Glycol (SYSTANE) 0.4-0.3 % SOLN Place 1 drop into both eyes 3 (three) times daily.  Marland Kitchen senna (SENOKOT) 8.6 MG tablet Take 2 tablets by mouth. Take 2 tablets once daily  . sodium chloride (MURO 128) 2 % ophthalmic solution Place 1 drop into the left eye. One drop in left eye three times a day  . sodium chloride (MURO 128) 5 % ophthalmic ointment Place 1 application into the left eye. Apply to left eye at bedtime  . sodium fluoride (PREVIDENT 5000 PLUS) 1.1 % CREA dental cream Place 1 application onto teeth every evening.  . Tamsulosin HCl (FLOMAX) 0.4 MG CAPS Take 0.4 mg by mouth every morning.   . venlafaxine (EFFEXOR) 75 MG tablet Take 75 mg by mouth daily.   No facility-administered encounter medications on file as of 06/15/2016.     Review of Systems  Constitutional: Negative for chills, diaphoresis, fever and unexpected weight change.  HENT: Positive for hearing loss and voice change (hoarse). Negative for congestion, ear discharge, ear pain, nosebleeds, postnasal drip, rhinorrhea, sinus pressure, sore throat and tinnitus.        Nasal congestion at night. R nose bleed, stopped by pressure.   Eyes: Negative for photophobia, pain, discharge and redness.       Crusty eyelashes.   Respiratory: Negative for cough, shortness of breath, wheezing and stridor.   Cardiovascular: Negative for chest pain, palpitations and leg swelling.    Gastrointestinal: Negative for abdominal pain, blood in stool, constipation, diarrhea, nausea and vomiting.  Endocrine: Negative for polydipsia.  Genitourinary: Positive for frequency. Negative for dysuria, flank pain, hematuria and urgency.  Musculoskeletal: Positive for back pain and gait problem. Negative for myalgias and neck pain.  Skin: Negative for rash.  Allergic/Immunologic: Negative for environmental allergies.  Neurological: Negative for dizziness, tremors, seizures, weakness and headaches.  Hematological: Does not bruise/bleed easily.  Psychiatric/Behavioral: Positive for sleep disturbance. Negative for hallucinations and suicidal ideas. The patient is not nervous/anxious.     Immunization History  Administered Date(s) Administered  . DTaP 10/31/2011  . Influenza Whole 12/20/2012  . Influenza-Unspecified 12/06/2013, 12/06/2014, 12/11/2015  . Pneumococcal-Unspecified 10/31/2011  . Tdap 10/24/2011   Pertinent  Health Maintenance Due  Topic Date Due  . PNA vac Low Risk Adult (2 of 2 - PCV13) 10/30/2012  . INFLUENZA VACCINE  09/29/2016   Fall Risk  01/12/2016 09/12/2014 05/23/2014  Falls in the past year? Yes Yes No  Number falls in past yr: 1 1 -  Injury with Fall? Yes No -  Risk for fall due to : - - History of fall(s);Impaired balance/gait   Functional Status Survey:    Vitals:   06/15/16 1138  Pulse: 74  Resp: 14  Temp: 97.4 F (36.3 C)  SpO2: 99%  Weight: 132 lb (59.9 kg)  Height: 5\' 3"  (1.6 m)   Body mass index is 23.38 kg/m. Physical Exam  Constitutional: He appears well-developed and well-nourished.  HENT:  Head: Normocephalic and atraumatic. Not macrocephalic.  Nose: No mucosal edema or rhinorrhea. Right sinus exhibits no maxillary sinus tenderness and no frontal sinus tenderness. Left sinus exhibits no maxillary sinus tenderness and no frontal sinus tenderness.  Mouth/Throat: Uvula is midline. Posterior oropharyngeal erythema present. No  oropharyngeal exudate or posterior oropharyngeal edema.  Lateral right nostril dry blood seen.   Eyes: EOM are normal. Pupils are equal, round, and reactive to light.  Neck: Normal range of motion. Neck supple. No JVD present. No thyromegaly present.  Cardiovascular: Normal rate and regular rhythm.   Murmur heard.  Systolic murmur is present with a grade of 2/6  Pulmonary/Chest: Effort normal and breath sounds normal. He has no wheezes. He has no rales.  Abdominal: Soft. Bowel sounds are normal. There is no tenderness.  Musculoskeletal: Normal range of motion. He exhibits tenderness. He exhibits no edema.  Scoliokyphosis, walking with walker with SBA. Chronic back pain. 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  Lymphadenopathy:    He has no cervical adenopathy.  Neurological: He is alert. He displays normal reflexes. No cranial nerve deficit. He exhibits normal muscle tone. Coordination normal.  Mumbling and incoherent answers sometimes.  Skin: Skin is warm and dry. No rash noted. No erythema.  AK top of head  Psychiatric: His mood appears not anxious. His affect is not angry, not blunt and not labile. His speech is not delayed and not slurred. He is not agitated, not aggressive, not slowed, not withdrawn, not actively hallucinating and not combative. Thought content is not paranoid and not delusional. Cognition and memory are impaired. He expresses impulsivity. He does not exhibit a depressed mood. He exhibits abnormal recent memory. He is attentive.    Labs reviewed:  Recent Labs  03/15/16 04/15/16 05/13/16  NA 135* 138 134*  K 4.2 4.5 4.5  BUN 54* 61* 69*  CREATININE 3.3* 3.3* 3.4*    Recent Labs  12/06/15 12/08/15 05/13/16  AST 18 15 11*  ALT 11 11 10   ALKPHOS 67 77 81    Recent Labs  12/06/15 12/08/15 05/13/16  WBC 5.9 7.6 7.0  HGB 10.1* 10.0* 9.3*  HCT 30* 29* 29*  PLT 216 214 211   Lab Results  Component Value Date   TSH 1.09 06/12/2015   Lab Results    Component Value Date   HGBA1C 5.3 06/12/2015   No results found for: CHOL, HDL, LDLCALC, LDLDIRECT, TRIG, CHOLHDL  Significant Diagnostic Results in last 30 days:  No results found.  Assessment/Plan Otitis externa  left ear odorous drainage, no pain when tragus pressed and auricle was manipulated. Otoscope exam reveal puss and irritated from external ear canal, sensitive during when examined.  Cipro HC 3gtt tid  to the left ear x 7 days. Observe.   Hypertension Controlled, continue Amlodipine 5mg , 05/13/16 Na 134, K 4.5, Bun 69, creat 3.42, wbc 7.0, Hgb 9.3, plt 211  GERD (gastroesophageal reflux disease) Stable, off Omeprazole, Zantac.   Constipation Stable, continue Senokot S II qhs and MOM qd prn, Doculax suppository prn.   Dementia with behavioral disturbance Functioning well in SNF, stays in his room most of time, ambulates with walker, continue Namenda  Anemia in chronic kidney disease 05/13/16 Na 134, K 4.5, Bun 69, creat 3.42, wbc 7.0, Hgb 9.3, plt 211  BPH (benign prostatic hyperplasia) Last urinary retention, Foley inserted 11/04/12 at ED-successfully removed--continued Tamsulosin and Finasteride   CKD stage 4 secondary to hypertension ([place]) 05/13/16 Na 134, K 4.5, Bun 69, creat 3.42, wbc 7.0, Hgb 9.3, plt 211. Last nephrology evaluation 04/22/16  Back pain Severe kyphosis, only walks a few steps  Depression Mood is stable, continue Effexor 75mg  and Mirtazapine 7.5mg  qhs due to relapsed insomnia(off it since 11/18/15).  Sleeps well at night  Hyponatremia Last Na 134 05/13/16  Blepharitis of both eyes Resolved.      Family/ staff Communication: SNF  Labs/tests ordered:  none

## 2016-06-15 NOTE — Assessment & Plan Note (Signed)
Functioning well in SNF, stays in his room most of time, ambulates with walker, continue Namenda

## 2016-06-15 NOTE — Assessment & Plan Note (Signed)
Last Na 134 05/13/16

## 2016-06-15 NOTE — Assessment & Plan Note (Signed)
Last urinary retention, Foley inserted 11/04/12 at ED-successfully removed--continued Tamsulosin and Finasteride

## 2016-06-15 NOTE — Assessment & Plan Note (Signed)
Controlled, continue Amlodipine 5mg , 05/13/16 Na 134, K 4.5, Bun 69, creat 3.42, wbc 7.0, Hgb 9.3, plt 211

## 2016-06-15 NOTE — Assessment & Plan Note (Signed)
Severe kyphosis, only walks a few steps

## 2016-06-15 NOTE — Assessment & Plan Note (Signed)
05/13/16 Na 134, K 4.5, Bun 69, creat 3.42, wbc 7.0, Hgb 9.3, plt 211. Last nephrology evaluation 04/22/16

## 2016-06-15 NOTE — Assessment & Plan Note (Signed)
Stable, off Omeprazole, Zantac.

## 2016-06-15 NOTE — Assessment & Plan Note (Signed)
Stable, continue Senokot S II qhs and MOM qd prn, Doculax suppository prn.

## 2016-06-15 NOTE — Assessment & Plan Note (Signed)
Resolved

## 2016-06-15 NOTE — Assessment & Plan Note (Signed)
05/13/16 Na 134, K 4.5, Bun 69, creat 3.42, wbc 7.0, Hgb 9.3, plt 211

## 2016-06-15 NOTE — Assessment & Plan Note (Signed)
Mood is stable, continue Effexor 75mg  and Mirtazapine 7.5mg  qhs due to relapsed insomnia(off it since 11/18/15).  Sleeps well at night

## 2016-06-15 NOTE — Assessment & Plan Note (Signed)
left ear odorous drainage, no pain when tragus pressed and auricle was manipulated. Otoscope exam reveal puss and irritated from external ear canal, sensitive during when examined.  Cipro HC 3gtt tid to the left ear x 7 days. Observe.

## 2016-07-01 ENCOUNTER — Non-Acute Institutional Stay (SKILLED_NURSING_FACILITY): Payer: Medicare Other | Admitting: Internal Medicine

## 2016-07-01 ENCOUNTER — Encounter: Payer: Self-pay | Admitting: Internal Medicine

## 2016-07-01 DIAGNOSIS — N184 Chronic kidney disease, stage 4 (severe): Secondary | ICD-10-CM | POA: Diagnosis not present

## 2016-07-01 DIAGNOSIS — N183 Chronic kidney disease, stage 3 (moderate): Secondary | ICD-10-CM

## 2016-07-01 DIAGNOSIS — I10 Essential (primary) hypertension: Secondary | ICD-10-CM | POA: Diagnosis not present

## 2016-07-01 DIAGNOSIS — D631 Anemia in chronic kidney disease: Secondary | ICD-10-CM

## 2016-07-01 DIAGNOSIS — K219 Gastro-esophageal reflux disease without esophagitis: Secondary | ICD-10-CM | POA: Diagnosis not present

## 2016-07-01 DIAGNOSIS — F0151 Vascular dementia with behavioral disturbance: Secondary | ICD-10-CM

## 2016-07-01 DIAGNOSIS — I129 Hypertensive chronic kidney disease with stage 1 through stage 4 chronic kidney disease, or unspecified chronic kidney disease: Secondary | ICD-10-CM | POA: Diagnosis not present

## 2016-07-01 DIAGNOSIS — F01518 Vascular dementia, unspecified severity, with other behavioral disturbance: Secondary | ICD-10-CM

## 2016-07-01 NOTE — Progress Notes (Signed)
Progress Note    Location:  [facility] Room Number: N4 Place of Service:  SNF [phone]) Provider:  Jeanmarie Hubert, MD  Patient Care Team: Estill Dooms, MD as PCP - General (Internal Medicine) Man Otho Darner, NP as Nurse Practitioner (Internal Medicine)  Extended Emergency Contact Information Primary Emergency Contact: Phillis Haggis [address] [address] of [place] Phone: [phone] Relation: Nephew  Code Status:  DNR Goals of care: Advanced Directive information Advanced Directives 07/01/2016  Does Patient Have a Medical Advance Directive? Yes  Type of Paramedic of [place];Living will;Out of facility DNR (pink MOST or yellow form)  Does patient want to make changes to medical advance directive? -  Copy of [place] in Chart? Yes  Pre-existing out of facility DNR order (yellow form or pink MOST form) Yellow form placed in chart (order not valid for inpatient use);Pink MOST form placed in chart (order not valid for inpatient use)     Chief Complaint  Patient presents with  . Medical Management of Chronic Issues    routine visit  . Immunizations    due for PCV13    HPI:  Pt is a 83 y.o. male seen today for medical management of chronic diseases.    Vascular dementia with behavior disturbance - unchanged  Anemia in stage 3 chronic kidney disease - hgb 9.3 and falling. May need Aranesp in the future. Has seen Dr. Mercy Moore, neph.  Essential hypertension - controlled  Gastroesophageal reflux disease without esophagitis - a this time.symptomatic  CKD stage 4 secondary to hypertension [facility]) - haas seen Dr Mercy Moore. Nothing more to do at this time     Past Medical History:  Diagnosis Date  . Anemia   . Anxiety   . Back pain   . Cataract   . Chronic kidney disease 2013   rhabdo  . CKD (chronic kidney disease) stage 3, GFR 30-59 ml/min 08/29/2012   Creatinine 1.88 07/05/13(baseline  1.5-2.0) 02/11/14 creatinine 2.00 05/16/14 creatinine 2.31 07/18/14 creatinine 2.22     . CVA (cerebral infarction)   . Cystic disease of liver   . Dementia with behavioral disturbance 06/06/2012  . Depression 06/06/2012  . Diverticulosis   . Edema   . Gait disorder   . GERD (gastroesophageal reflux disease)   . H/O hiatal hernia 2008   repaired  . Hearing loss   . Hearing loss   . Hypertension   . Hyponatremia   . Inguinal hernia   . Keratosis   . Paresthesia   . Parkinsonism ([place])   . PVC (premature ventricular contraction)   . Sciatica   . Sciatica   . Stroke [facility]) 2010   no deficits  . Thyroid disease   . Vitamin D deficiency disease    Past Surgical History:  Procedure Laterality Date  . APPENDECTOMY    . ESOPHAGOGASTRODUODENOSCOPY  10/25/2011   Procedure: ESOPHAGOGASTRODUODENOSCOPY (EGD);  Surgeon: Winfield Cunas., MD;  Location: [facility] ENDOSCOPY;  Service: Endoscopy;  Laterality: N/A;  . HERNIA REPAIR    . PROSTATE SURGERY     cancer    Allergies  Allergen Reactions  . Olanzapine Other (See Comments)    unknown  . Rivastigmine Nausea Only    Outpatient Encounter Prescriptions as of 07/01/2016  Medication Sig  . acetaminophen (TYLENOL) 325 MG tablet Take 650 mg by mouth every 4 (four) hours as needed.  Marland Kitchen acetaminophen (TYLENOL)  500 MG tablet Take 500 mg by mouth at bedtime.   Marland Kitchen amLODipine (NORVASC) 5 MG tablet Take 5 mg by mouth. Take one tablet daily for blood pressure  . bisacodyl (DULCOLAX) 10 MG suppository Place 10 mg rectally daily as needed for moderate constipation.  . Cholecalciferol (VITAMIN D-3) 1000 units CAPS Take by mouth. Take one capsule daily  . finasteride (PROSCAR) 5 MG tablet Take 5 mg by mouth daily.  . fluticasone (FLONASE) 50 MCG/ACT nasal spray Place 1 spray into both nostrils daily.  Marland Kitchen loteprednol (LOTEMAX) 0.5 % ophthalmic suspension Place 1 drop into the left eye daily.  . magnesium hydroxide (MILK OF MAGNESIA) 400 MG/5ML suspension Take  30 mLs by mouth daily as needed for mild constipation.   . Melatonin 3 MG TABS Take 3 mg by mouth at bedtime.   . Memantine HCl ER (NAMENDA XR) 28 MG CP24 Take 28 mg by mouth daily. Take one daily for memory  . mirtazapine (REMERON) 7.5 MG tablet Take 7.5 mg by mouth. Take one tablet at bedtime  . Nutritional Supplements (RESOURCE BREEZE PO) Take by mouth. 120 ml four times a day, nutritional supplement  . Omega-3 Fatty Acids (FISH OIL) 1000 MG CAPS Take 2 capsules by mouth daily.  Vladimir Faster Glycol-Propyl Glycol (SYSTANE) 0.4-0.3 % SOLN Place 1 drop into both eyes 3 (three) times daily.  Marland Kitchen senna (SENOKOT) 8.6 MG tablet Take 2 tablets by mouth. Take 2 tablets once daily  . sodium chloride (MURO 128) 2 % ophthalmic solution Place 1 drop into the left eye. One drop in left eye three times a day  . sodium chloride (MURO 128) 5 % ophthalmic ointment Place 1 application into the left eye. Apply to left eye at bedtime  . sodium fluoride (PREVIDENT 5000 PLUS) 1.1 % CREA dental cream Place 1 application onto teeth every evening.  . Tamsulosin HCl (FLOMAX) 0.4 MG CAPS Take 0.4 mg by mouth every morning.   . venlafaxine (EFFEXOR) 75 MG tablet Take 75 mg by mouth daily.   No facility-administered encounter medications on file as of 07/01/2016.     Review of Systems  Constitutional: Negative for chills, diaphoresis, fever and unexpected weight change.  HENT: Positive for hearing loss, sinus pressure and voice change (hoarse). Negative for congestion, ear discharge, ear pain, nosebleeds, postnasal drip, rhinorrhea, sore throat and tinnitus.        Nasal congestion at night  Eyes: Negative for photophobia, pain, discharge and redness.  Respiratory: Negative for cough, shortness of breath, wheezing and stridor.   Cardiovascular: Negative for chest pain, palpitations and leg swelling.  Gastrointestinal: Negative for abdominal pain, blood in stool, constipation, diarrhea, nausea and vomiting.  Endocrine:  Negative for polydipsia.  Genitourinary: Positive for frequency. Negative for dysuria, flank pain, hematuria and urgency.       CKD 4 Recurrent UTI. Last with pseudomonas.  Musculoskeletal: Positive for back pain and gait problem. Negative for myalgias and neck pain.  Skin: Negative for rash.  Allergic/Immunologic: Negative for environmental allergies.  Neurological: Positive for weakness. Negative for dizziness, tremors, seizures and headaches.       Memory loss  Hematological: Does not bruise/bleed easily.  Psychiatric/Behavioral: Positive for confusion and sleep disturbance. Negative for hallucinations and suicidal ideas. The patient is not nervous/anxious.     Immunization History  Administered Date(s) Administered  . DTaP 10/31/2011  . Influenza Whole 12/20/2012  . Influenza-Unspecified 12/06/2013, 12/06/2014, 12/11/2015  . Pneumococcal-Unspecified 10/31/2011  . Tdap 10/24/2011   Pertinent  Health Maintenance Due  Topic Date Due  . PNA vac Low Risk Adult (2 of 2 - PCV13) 10/30/2012  . INFLUENZA VACCINE  09/29/2016   Fall Risk  01/12/2016 09/12/2014 05/23/2014  Falls in the past year? Yes Yes No  Number falls in past yr: 1 1 -  Injury with Fall? Yes No -  Risk for fall due to : - - History of fall(s);Impaired balance/gait    Vitals:   07/01/16 1103  BP: (!) 150/80  Pulse: 68  Resp: 20  Temp: 97 F (36.1 C)  SpO2: 97%  Weight: 132 lb (59.9 kg)  Height: 5\' 3"  (1.6 m)   Body mass index is 23.38 kg/m. Physical Exam  Constitutional: He appears well-developed and well-nourished.  HENT:  Head: Normocephalic and atraumatic. Not macrocephalic.  Nose: No mucosal edema or rhinorrhea. Right sinus exhibits no maxillary sinus tenderness and no frontal sinus tenderness. Left sinus exhibits no maxillary sinus tenderness and no frontal sinus tenderness.  Mouth/Throat: Uvula is midline. Posterior oropharyngeal erythema present. No oropharyngeal exudate or posterior oropharyngeal  edema.  Eyes: EOM are normal. Pupils are equal, round, and reactive to light.  Neck: Normal range of motion. Neck supple. No JVD present. No thyromegaly present.  Cardiovascular: Normal rate and regular rhythm.   Murmur heard.  Systolic murmur is present with a grade of 2/6  Pulmonary/Chest: Effort normal and breath sounds normal. He has no wheezes. He has no rales.  Abdominal: Soft. Bowel sounds are normal. There is no tenderness.  Musculoskeletal: Normal range of motion. He exhibits tenderness. He exhibits no edema.  Scoliokyphosis, walking with walker with SBA. Chronic back pain. 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  Lymphadenopathy:    He has no cervical adenopathy.  Neurological: He is alert. He displays normal reflexes. No cranial nerve deficit. He exhibits normal muscle tone. Coordination normal.  Memory loss. Mumbling and incoherent answers sometimes.  Skin: Skin is warm and dry. No rash noted. No erythema.  AK top of head  Psychiatric: He has a normal mood and affect. His behavior is normal. His mood appears not anxious. His affect is not angry, not blunt and not labile. His speech is not delayed and not slurred. He is not agitated, not aggressive, not slowed, not withdrawn, not actively hallucinating and not combative. Thought content is not paranoid and not delusional. Cognition and memory are impaired. He expresses impulsivity. He does not exhibit a depressed mood. He exhibits abnormal recent memory. He is attentive.    Labs reviewed:  Recent Labs  03/15/16 04/15/16 05/13/16  NA 135* 138 134*  K 4.2 4.5 4.5  BUN 54* 61* 69*  CREATININE 3.3* 3.3* 3.4*    Recent Labs  12/06/15 12/08/15 05/13/16  AST 18 15 11*  ALT 11 11 10   ALKPHOS 67 77 81    Recent Labs  12/06/15 12/08/15 05/13/16  WBC 5.9 7.6 7.0  HGB 10.1* 10.0* 9.3*  HCT 30* 29* 29*  PLT 216 214 211   Lab Results  Component Value Date   TSH 1.09 06/12/2015   Lab Results  Component Value  Date   HGBA1C 5.3 06/12/2015   Assessment/Plan 1. Vascular dementia with behavior disturbance unchanged  2. Anemia in stage 3 chronic kidney disease Continue to monitor. May need Aranesp in the future  3. Essential hypertension controlled  4. Gastroesophageal reflux disease without esophagitis asymptomatic  5. CKD stage 4 secondary to hypertension ([place]) Continue with neph prn.

## 2016-07-09 DIAGNOSIS — Q6689 Other  specified congenital deformities of feet: Secondary | ICD-10-CM | POA: Diagnosis not present

## 2016-07-09 DIAGNOSIS — B351 Tinea unguium: Secondary | ICD-10-CM | POA: Diagnosis not present

## 2016-07-16 DIAGNOSIS — N390 Urinary tract infection, site not specified: Secondary | ICD-10-CM | POA: Diagnosis not present

## 2016-07-20 ENCOUNTER — Encounter: Payer: Self-pay | Admitting: Nurse Practitioner

## 2016-07-20 ENCOUNTER — Non-Acute Institutional Stay (SKILLED_NURSING_FACILITY): Payer: Medicare Other | Admitting: Nurse Practitioner

## 2016-07-20 DIAGNOSIS — N183 Chronic kidney disease, stage 3 unspecified: Secondary | ICD-10-CM

## 2016-07-20 DIAGNOSIS — E871 Hypo-osmolality and hyponatremia: Secondary | ICD-10-CM

## 2016-07-20 DIAGNOSIS — F320 Major depressive disorder, single episode, mild: Secondary | ICD-10-CM | POA: Diagnosis not present

## 2016-07-20 DIAGNOSIS — F0151 Vascular dementia with behavioral disturbance: Secondary | ICD-10-CM

## 2016-07-20 DIAGNOSIS — K5900 Constipation, unspecified: Secondary | ICD-10-CM | POA: Diagnosis not present

## 2016-07-20 DIAGNOSIS — K219 Gastro-esophageal reflux disease without esophagitis: Secondary | ICD-10-CM

## 2016-07-20 DIAGNOSIS — D631 Anemia in chronic kidney disease: Secondary | ICD-10-CM

## 2016-07-20 DIAGNOSIS — M5440 Lumbago with sciatica, unspecified side: Secondary | ICD-10-CM | POA: Diagnosis not present

## 2016-07-20 DIAGNOSIS — R627 Adult failure to thrive: Secondary | ICD-10-CM | POA: Insufficient documentation

## 2016-07-20 DIAGNOSIS — N400 Enlarged prostate without lower urinary tract symptoms: Secondary | ICD-10-CM | POA: Diagnosis not present

## 2016-07-20 DIAGNOSIS — I129 Hypertensive chronic kidney disease with stage 1 through stage 4 chronic kidney disease, or unspecified chronic kidney disease: Secondary | ICD-10-CM | POA: Diagnosis not present

## 2016-07-20 DIAGNOSIS — I10 Essential (primary) hypertension: Secondary | ICD-10-CM

## 2016-07-20 DIAGNOSIS — N390 Urinary tract infection, site not specified: Secondary | ICD-10-CM

## 2016-07-20 DIAGNOSIS — F01518 Vascular dementia, unspecified severity, with other behavioral disturbance: Secondary | ICD-10-CM

## 2016-07-20 DIAGNOSIS — F5101 Primary insomnia: Secondary | ICD-10-CM

## 2016-07-20 DIAGNOSIS — N184 Chronic kidney disease, stage 4 (severe): Secondary | ICD-10-CM

## 2016-07-20 NOTE — Assessment & Plan Note (Signed)
Stable, continue Senokot S II qhs and MOM qd prn, Doculax suppository prn.

## 2016-07-20 NOTE — Assessment & Plan Note (Signed)
Controlled, continue Amlodipine 5mg 

## 2016-07-20 NOTE — Assessment & Plan Note (Signed)
Stable, off Omeprazole, Zantac.

## 2016-07-20 NOTE — Assessment & Plan Note (Signed)
Mood is stable, continue Effexor 75mg , but he is losing weight, his appetite is poor, will ncrease Mirtazapine 30mg  qhs po, observe.

## 2016-07-20 NOTE — Assessment & Plan Note (Signed)
Sleeps well.  

## 2016-07-20 NOTE — Assessment & Plan Note (Signed)
05/13/16 Na 134

## 2016-07-20 NOTE — Assessment & Plan Note (Signed)
12/15/15 Na 133, K 3.7, Bun 33, creat 2.71, urine culture P. Aeruginosa 7 day course of Cipro 250mg  bid.  07/18/16 urine culture Ps. Aeruginosa >100,000c/ml,  Septra DS bid x 7 days

## 2016-07-20 NOTE — Assessment & Plan Note (Signed)
Functioning well in SNF, stays in his room most of time, ambulates with walker, continue Namenda

## 2016-07-20 NOTE — Assessment & Plan Note (Signed)
05/13/16 Na 134, K 4.5, Bun 69, creat 3.42, wbc 7.0, Hgb 9.3, plt 211 06/01/16 [facility], no change in [place].  06/01/16 [facility]: no change, avoid NSAIDs, ACE, ARBs, may add Furosemide if edema develops.

## 2016-07-20 NOTE — Progress Notes (Signed)
Location:  [facility] Room Number: 4 Place of Service:  SNF (31) Provider:  Shaheen Mende, [place]  NP  Estill Dooms, MD  Patient Care Team: Estill Dooms, MD as PCP - General (Internal Medicine) Janiyha Montufar X, NP as Nurse Practitioner (Internal Medicine) Fleet Contras, MD as Consulting Physician (Nephrology)  Extended Emergency Contact Information Primary Emergency Contact: Phillis Haggis [address] [address] of [place] Phone: [phone] Relation: Nephew  Code Status:  DNR Goals of care: Advanced Directive information Advanced Directives 07/20/2016  Does Patient Have a Medical Advance Directive? Yes  Type of Paramedic of [place];Living will;Out of facility DNR (pink MOST or yellow form)  Does patient want to make changes to medical advance directive? No - Patient declined  Copy of [place] in Chart? Yes  Pre-existing out of facility DNR order (yellow form or pink MOST form) Yellow form placed in chart (order not valid for inpatient use)     Chief Complaint  Patient presents with  . Acute Visit    UTI    HPI:  Pt is a 83 y.o. male seen today for an acute visit for UTI, 07/18/16 urine culture Ps. Aeruginosa >100,000c/ml,  Septra DS bid x 7 days, tolerated.   Hx of HTN controlled while on Amlodipine 5mg , GERD is stable, off Zantac, Omeprazole, dementia has been stabilized on Namenda, resides in SNF for care needs. Hx of chronic kidney disease with baseline creat 2s. He is forgetful, but able to voice his needs while on Namenda Hx of BPH with recurrent urinary retention is the past, taking Tamsulosin and Finasteride, sleeps well, but his appetite is reportedly poor, on Mirtazapine and Effexor.    Past Medical History:  Diagnosis Date  . Anemia   . Anxiety   . Back pain   . Cataract   . Chronic kidney disease 2013   rhabdo  . CKD (chronic kidney disease) stage 3, GFR 30-59 ml/min  08/29/2012   Creatinine 1.88 07/05/13(baseline 1.5-2.0) 02/11/14 creatinine 2.00 05/16/14 creatinine 2.31 07/18/14 creatinine 2.22     . CVA (cerebral infarction)   . Cystic disease of liver   . Dementia with behavioral disturbance 06/06/2012  . Depression 06/06/2012  . Diverticulosis   . Edema   . Gait disorder   . GERD (gastroesophageal reflux disease)   . H/O hiatal hernia 2008   repaired  . Hearing loss   . Hearing loss   . Hypertension   . Hyponatremia   . Inguinal hernia   . Keratosis   . Paresthesia   . Parkinsonism ([place])   . PVC (premature ventricular contraction)   . Sciatica   . Sciatica   . Stroke [facility]) 2010   no deficits  . Thyroid disease   . Vitamin D deficiency disease    Past Surgical History:  Procedure Laterality Date  . APPENDECTOMY    . ESOPHAGOGASTRODUODENOSCOPY  10/25/2011   Procedure: ESOPHAGOGASTRODUODENOSCOPY (EGD);  Surgeon: Winfield Cunas., MD;  Location: [facility] ENDOSCOPY;  Service: Endoscopy;  Laterality: N/A;  . HERNIA REPAIR    . PROSTATE SURGERY     cancer    Allergies  Allergen Reactions  . Olanzapine Other (See Comments)    unknown  . Rivastigmine Nausea Only    Outpatient Encounter Prescriptions as of 07/20/2016  Medication Sig  . acetaminophen (TYLENOL) 325 MG tablet Take 650 mg by mouth every 4 (four) hours as  needed.  Marland Kitchen acetaminophen (TYLENOL) 500 MG tablet Take 500 mg by mouth at bedtime.   Marland Kitchen amLODipine (NORVASC) 5 MG tablet Take 5 mg by mouth. Take one tablet daily for blood pressure  . bisacodyl (DULCOLAX) 10 MG suppository Place 10 mg rectally daily as needed for moderate constipation.  . Cholecalciferol (VITAMIN D-3) 1000 units CAPS Take by mouth. Take one capsule daily  . finasteride (PROSCAR) 5 MG tablet Take 5 mg by mouth daily.  . fluticasone (FLONASE) 50 MCG/ACT nasal spray Place 1 spray into both nostrils daily.  Marland Kitchen loteprednol (LOTEMAX) 0.5 % ophthalmic suspension Place 1 drop into the left eye daily.  . magnesium hydroxide  (MILK OF MAGNESIA) 400 MG/5ML suspension Take 30 mLs by mouth daily as needed for mild constipation.   . Melatonin 3 MG TABS Take 3 mg by mouth at bedtime.   . Memantine HCl ER (NAMENDA XR) 28 MG CP24 Take 28 mg by mouth daily. Take one daily for memory  . mirtazapine (REMERON) 7.5 MG tablet Take 7.5 mg by mouth. Take one tablet at bedtime  . Nutritional Supplements (RESOURCE BREEZE PO) Take by mouth. 120 ml four times a day, nutritional supplement  . Omega-3 Fatty Acids (FISH OIL) 1000 MG CAPS Take 2 capsules by mouth daily.  Vladimir Faster Glycol-Propyl Glycol (SYSTANE) 0.4-0.3 % SOLN Place 1 drop into both eyes 3 (three) times daily.  Marland Kitchen senna (SENOKOT) 8.6 MG tablet Take 2 tablets by mouth. Take 2 tablets once daily  . sodium chloride (MURO 128) 2 % ophthalmic solution Place 1 drop into the left eye. One drop in left eye three times a day  . sodium chloride (MURO 128) 5 % ophthalmic ointment Place 1 application into the left eye. Apply to left eye at bedtime  . sodium fluoride (PREVIDENT 5000 PLUS) 1.1 % CREA dental cream Place 1 application onto teeth every evening.  . sulfamethoxazole-trimethoprim (BACTRIM DS,SEPTRA DS) 800-160 MG tablet Take 1 tablet by mouth 2 (two) times daily.  . Tamsulosin HCl (FLOMAX) 0.4 MG CAPS Take 0.4 mg by mouth every morning.   . venlafaxine (EFFEXOR) 75 MG tablet Take 75 mg by mouth daily.   No facility-administered encounter medications on file as of 07/20/2016.     Review of Systems  Constitutional: Positive for appetite change and unexpected weight change. Negative for chills, diaphoresis and fever.       C/o poor appetite  HENT: Positive for hearing loss, sinus pressure and voice change (hoarse). Negative for congestion, ear discharge, ear pain, nosebleeds, postnasal drip, rhinorrhea, sore throat and tinnitus.        Nasal congestion at night  Eyes: Negative for photophobia, pain, discharge and redness.  Respiratory: Negative for cough, shortness of breath,  wheezing and stridor.   Cardiovascular: Negative for chest pain, palpitations and leg swelling.  Gastrointestinal: Negative for abdominal pain, blood in stool, constipation, diarrhea, nausea and vomiting.  Endocrine: Negative for polydipsia.  Genitourinary: Positive for frequency. Negative for dysuria, flank pain, hematuria and urgency.       CKD 4 Recurrent UTI. Last with pseudomonas.  Musculoskeletal: Positive for back pain and gait problem. Negative for myalgias and neck pain.  Skin: Negative for rash.  Allergic/Immunologic: Negative for environmental allergies.  Neurological: Positive for weakness. Negative for dizziness, tremors, seizures and headaches.       Memory loss  Hematological: Does not bruise/bleed easily.  Psychiatric/Behavioral: Positive for confusion and sleep disturbance. Negative for hallucinations and suicidal ideas. The patient is not nervous/anxious.  Immunization History  Administered Date(s) Administered  . DTaP 10/31/2011  . Influenza Whole 12/20/2012  . Influenza-Unspecified 12/06/2013, 12/06/2014, 12/11/2015  . Pneumococcal-Unspecified 10/31/2011  . Tdap 10/24/2011   Pertinent  Health Maintenance Due  Topic Date Due  . PNA vac Low Risk Adult (2 of 2 - PCV13) 10/30/2012  . INFLUENZA VACCINE  09/29/2016   Fall Risk  01/12/2016 09/12/2014 05/23/2014  Falls in the past year? Yes Yes No  Number falls in past yr: 1 1 -  Injury with Fall? Yes No -  Risk for fall due to : - - History of fall(s);Impaired balance/gait   Functional Status Survey:    Vitals:   07/20/16 1309  BP: 131/80  Pulse: 67  Resp: 16  Temp: 97.8 F (36.6 C)  Weight: 130 lb (59 kg)  Height: 5\' 3"  (1.6 m)   Body mass index is 23.03 kg/m. Physical Exam  Constitutional: He appears well-developed and well-nourished.  HENT:  Head: Normocephalic and atraumatic. Not macrocephalic.  Nose: No mucosal edema or rhinorrhea. Right sinus exhibits no maxillary sinus tenderness and no  frontal sinus tenderness. Left sinus exhibits no maxillary sinus tenderness and no frontal sinus tenderness.  Mouth/Throat: Uvula is midline. Posterior oropharyngeal erythema present. No oropharyngeal exudate or posterior oropharyngeal edema.  Eyes: EOM are normal. Pupils are equal, round, and reactive to light.  Neck: Normal range of motion. Neck supple. No JVD present. No thyromegaly present.  Cardiovascular: Normal rate and regular rhythm.   Murmur heard.  Systolic murmur is present with a grade of 2/6  Pulmonary/Chest: Effort normal and breath sounds normal. He has no wheezes. He has no rales.  Abdominal: Soft. Bowel sounds are normal. There is no tenderness.  Musculoskeletal: Normal range of motion. He exhibits tenderness. He exhibits no edema.  Scoliokyphosis, walking with walker with SBA. Chronic back pain. 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  Lymphadenopathy:    He has no cervical adenopathy.  Neurological: He is alert. He displays normal reflexes. No cranial nerve deficit. He exhibits normal muscle tone. Coordination normal.  Memory loss. Mumbling and incoherent answers sometimes.  Skin: Skin is warm and dry. No rash noted. No erythema.  AK top of head  Psychiatric: He has a normal mood and affect. His behavior is normal. His mood appears not anxious. His affect is not angry, not blunt and not labile. His speech is not delayed and not slurred. He is not agitated, not aggressive, not slowed, not withdrawn, not actively hallucinating and not combative. Thought content is not paranoid and not delusional. Cognition and memory are impaired. He expresses impulsivity. He does not exhibit a depressed mood. He exhibits abnormal recent memory. He is attentive.    Labs reviewed:  Recent Labs  03/15/16 04/15/16 05/13/16  NA 135* 138 134*  K 4.2 4.5 4.5  BUN 54* 61* 69*  CREATININE 3.3* 3.3* 3.4*    Recent Labs  12/06/15 12/08/15 05/13/16  AST 18 15 11*  ALT 11 11 10     ALKPHOS 67 77 81    Recent Labs  12/06/15 12/08/15 05/13/16  WBC 5.9 7.6 7.0  HGB 10.1* 10.0* 9.3*  HCT 30* 29* 29*  PLT 216 214 211   Lab Results  Component Value Date   TSH 1.09 06/12/2015   Lab Results  Component Value Date   HGBA1C 5.3 06/12/2015   No results found for: CHOL, HDL, LDLCALC, LDLDIRECT, TRIG, CHOLHDL  Significant Diagnostic Results in last 30 days:  No results  found.  Assessment/Plan Adult failure to thrive The patient reported poor appetite, currently he is under tx of UTI, but weight loss is gradual and not related to current UTI, will update CBC CMP TSH, increase Mirtazapine to 30mg  po qd. May consider Hospice Service.   UTI (urinary tract infection) 12/15/15 Na 133, K 3.7, Bun 33, creat 2.71, urine culture P. Aeruginosa 7 day course of Cipro 250mg  bid.  07/18/16 urine culture Ps. Aeruginosa >100,000c/ml,  Septra DS bid x 7 days  Hypertension Controlled, continue Amlodipine 5mg   GERD (gastroesophageal reflux disease) Stable, off Omeprazole, Zantac.   Constipation Stable, continue Senokot S II qhs and MOM qd prn, Doculax suppository prn.   Dementia with behavioral disturbance Functioning well in SNF, stays in his room most of time, ambulates with walker, continue Namenda  CKD stage 4 secondary to hypertension ([place]) 05/13/16 Na 134, K 4.5, Bun 69, creat 3.42, wbc 7.0, Hgb 9.3, plt 211 06/01/16 [facility], no change in [place].  06/01/16 [facility]: no change, avoid NSAIDs, ACE, ARBs, may add Furosemide if edema develops.   Anemia in chronic kidney disease 05/13/16 Na 134, K 4.5, Bun 69, creat 3.42, wbc 7.0, Hgb 9.3, plt 211 06/01/16 [facility], no change in [place].  06/01/16  [facility]: no change, avoid NSAIDs, ACE, ARBs, may add Furosemide if edema develops.   BPH (benign prostatic hyperplasia) Last urinary retention, Foley inserted 11/04/12 at ED-successfully removed--continued Tamsulosin and Finasteride    Back pain Severe  kyphosis, only walks a few steps  Depression Mood is stable, continue Effexor 75mg , but he is losing weight, his appetite is poor, will ncrease Mirtazapine 30mg  qhs po, observe.   Hyponatremia 05/13/16 Na 134  Insomnia Sleeps well     Family/ staff Communication: SNF may Hospice Service  Labs/tests ordered:  CBC CMP TSH

## 2016-07-20 NOTE — Assessment & Plan Note (Signed)
The patient reported poor appetite, currently he is under tx of UTI, but weight loss is gradual and not related to current UTI, will update CBC CMP TSH, increase Mirtazapine to 30mg  po qd. May consider Hospice Service.

## 2016-07-20 NOTE — Assessment & Plan Note (Signed)
05/13/16 Na 134, K 4.5, Bun 69, creat 3.42, wbc 7.0, Hgb 9.3, plt 211 06/01/16 [facility], no change in [place].  06/01/16 [facility]: no change, avoid NSAIDs, ACE, ARBs, may add Furosemide if edema develops.

## 2016-07-20 NOTE — Assessment & Plan Note (Signed)
Severe kyphosis, only walks a few steps

## 2016-07-20 NOTE — Assessment & Plan Note (Signed)
Last urinary retention, Foley inserted 11/04/12 at ED-successfully removed--continued Tamsulosin and Finasteride

## 2016-07-22 DIAGNOSIS — E876 Hypokalemia: Secondary | ICD-10-CM | POA: Diagnosis not present

## 2016-07-22 DIAGNOSIS — I10 Essential (primary) hypertension: Secondary | ICD-10-CM | POA: Diagnosis not present

## 2016-07-22 DIAGNOSIS — D649 Anemia, unspecified: Secondary | ICD-10-CM | POA: Diagnosis not present

## 2016-07-22 LAB — CBC AND DIFFERENTIAL
HCT: 27 % — AB (ref 41–53)
Hemoglobin: 8.8 g/dL — AB (ref 13.5–17.5)
Platelets: 219 10*3/uL (ref 150–399)
WBC: 7.3 10^3/mL

## 2016-07-22 LAB — HEPATIC FUNCTION PANEL
ALT: 9 U/L — AB (ref 10–40)
AST: 11 U/L — AB (ref 14–40)
Alkaline Phosphatase: 84 U/L (ref 25–125)
Bilirubin, Total: 0.3 mg/dL

## 2016-07-22 LAB — BASIC METABOLIC PANEL
BUN: 58 mg/dL — AB (ref 4–21)
Creatinine: 4 mg/dL — AB (ref 0.6–1.3)
GLUCOSE: 70 mg/dL
POTASSIUM: 4.4 mmol/L (ref 3.4–5.3)
SODIUM: 138 mmol/L (ref 137–147)

## 2016-07-22 LAB — TSH: TSH: 0.7 u[IU]/mL (ref 0.41–5.90)

## 2016-07-24 ENCOUNTER — Emergency Department (HOSPITAL_COMMUNITY): Payer: Medicare Other

## 2016-07-24 ENCOUNTER — Encounter (HOSPITAL_COMMUNITY)
Admission: EM | Disposition: A | Discharge status: Skilled Nursing Facility | Payer: Self-pay | Source: Home / Self Care | Attending: Internal Medicine

## 2016-07-24 ENCOUNTER — Inpatient Hospital Stay (HOSPITAL_COMMUNITY): Payer: Medicare Other | Admitting: Certified Registered Nurse Anesthetist

## 2016-07-24 ENCOUNTER — Inpatient Hospital Stay (HOSPITAL_COMMUNITY): Payer: Medicare Other

## 2016-07-24 ENCOUNTER — Inpatient Hospital Stay (HOSPITAL_COMMUNITY)
Admission: EM | Admit: 2016-07-24 | Discharge: 2016-07-29 | DRG: 481 | Disposition: A | Discharge status: Skilled Nursing Facility | Payer: Medicare Other | Attending: Internal Medicine | Admitting: Internal Medicine

## 2016-07-24 ENCOUNTER — Encounter (HOSPITAL_COMMUNITY): Payer: Self-pay | Admitting: Certified Registered Nurse Anesthetist

## 2016-07-24 DIAGNOSIS — H9190 Unspecified hearing loss, unspecified ear: Secondary | ICD-10-CM

## 2016-07-24 DIAGNOSIS — Z888 Allergy status to other drugs, medicaments and biological substances status: Secondary | ICD-10-CM | POA: Diagnosis not present

## 2016-07-24 DIAGNOSIS — F0151 Vascular dementia with behavioral disturbance: Secondary | ICD-10-CM | POA: Diagnosis not present

## 2016-07-24 DIAGNOSIS — F419 Anxiety disorder, unspecified: Secondary | ICD-10-CM | POA: Diagnosis present

## 2016-07-24 DIAGNOSIS — Z4789 Encounter for other orthopedic aftercare: Secondary | ICD-10-CM | POA: Diagnosis not present

## 2016-07-24 DIAGNOSIS — I10 Essential (primary) hypertension: Secondary | ICD-10-CM | POA: Diagnosis not present

## 2016-07-24 DIAGNOSIS — F329 Major depressive disorder, single episode, unspecified: Secondary | ICD-10-CM | POA: Diagnosis present

## 2016-07-24 DIAGNOSIS — Y92129 Unspecified place in nursing home as the place of occurrence of the external cause: Secondary | ICD-10-CM | POA: Diagnosis not present

## 2016-07-24 DIAGNOSIS — F320 Major depressive disorder, single episode, mild: Secondary | ICD-10-CM | POA: Diagnosis not present

## 2016-07-24 DIAGNOSIS — D72829 Elevated white blood cell count, unspecified: Secondary | ICD-10-CM | POA: Diagnosis not present

## 2016-07-24 DIAGNOSIS — D631 Anemia in chronic kidney disease: Secondary | ICD-10-CM | POA: Diagnosis not present

## 2016-07-24 DIAGNOSIS — M7989 Other specified soft tissue disorders: Secondary | ICD-10-CM | POA: Diagnosis not present

## 2016-07-24 DIAGNOSIS — W1830XA Fall on same level, unspecified, initial encounter: Secondary | ICD-10-CM | POA: Diagnosis present

## 2016-07-24 DIAGNOSIS — Z974 Presence of external hearing-aid: Secondary | ICD-10-CM

## 2016-07-24 DIAGNOSIS — N183 Chronic kidney disease, stage 3 (moderate): Secondary | ICD-10-CM

## 2016-07-24 DIAGNOSIS — Z419 Encounter for procedure for purposes other than remedying health state, unspecified: Secondary | ICD-10-CM

## 2016-07-24 DIAGNOSIS — F03918 Unspecified dementia, unspecified severity, with other behavioral disturbance: Secondary | ICD-10-CM | POA: Diagnosis present

## 2016-07-24 DIAGNOSIS — F0391 Unspecified dementia with behavioral disturbance: Secondary | ICD-10-CM | POA: Diagnosis not present

## 2016-07-24 DIAGNOSIS — Z7951 Long term (current) use of inhaled steroids: Secondary | ICD-10-CM | POA: Diagnosis not present

## 2016-07-24 DIAGNOSIS — D62 Acute posthemorrhagic anemia: Secondary | ICD-10-CM | POA: Diagnosis not present

## 2016-07-24 DIAGNOSIS — K219 Gastro-esophageal reflux disease without esophagitis: Secondary | ICD-10-CM | POA: Diagnosis present

## 2016-07-24 DIAGNOSIS — E039 Hypothyroidism, unspecified: Secondary | ICD-10-CM | POA: Diagnosis present

## 2016-07-24 DIAGNOSIS — R627 Adult failure to thrive: Secondary | ICD-10-CM | POA: Diagnosis present

## 2016-07-24 DIAGNOSIS — M25552 Pain in left hip: Secondary | ICD-10-CM

## 2016-07-24 DIAGNOSIS — R29898 Other symptoms and signs involving the musculoskeletal system: Secondary | ICD-10-CM | POA: Diagnosis not present

## 2016-07-24 DIAGNOSIS — Z66 Do not resuscitate: Secondary | ICD-10-CM | POA: Diagnosis not present

## 2016-07-24 DIAGNOSIS — N179 Acute kidney failure, unspecified: Secondary | ICD-10-CM | POA: Diagnosis present

## 2016-07-24 DIAGNOSIS — T370X5A Adverse effect of sulfonamides, initial encounter: Secondary | ICD-10-CM | POA: Diagnosis not present

## 2016-07-24 DIAGNOSIS — G20 Parkinson's disease: Secondary | ICD-10-CM | POA: Diagnosis not present

## 2016-07-24 DIAGNOSIS — S72002S Fracture of unspecified part of neck of left femur, sequela: Secondary | ICD-10-CM | POA: Diagnosis not present

## 2016-07-24 DIAGNOSIS — F32A Depression, unspecified: Secondary | ICD-10-CM | POA: Diagnosis present

## 2016-07-24 DIAGNOSIS — I129 Hypertensive chronic kidney disease with stage 1 through stage 4 chronic kidney disease, or unspecified chronic kidney disease: Secondary | ICD-10-CM | POA: Diagnosis not present

## 2016-07-24 DIAGNOSIS — S72009A Fracture of unspecified part of neck of unspecified femur, initial encounter for closed fracture: Secondary | ICD-10-CM | POA: Diagnosis not present

## 2016-07-24 DIAGNOSIS — N184 Chronic kidney disease, stage 4 (severe): Secondary | ICD-10-CM | POA: Diagnosis present

## 2016-07-24 DIAGNOSIS — S72002A Fracture of unspecified part of neck of left femur, initial encounter for closed fracture: Secondary | ICD-10-CM

## 2016-07-24 DIAGNOSIS — W19XXXA Unspecified fall, initial encounter: Secondary | ICD-10-CM | POA: Diagnosis not present

## 2016-07-24 DIAGNOSIS — Z6821 Body mass index (BMI) 21.0-21.9, adult: Secondary | ICD-10-CM

## 2016-07-24 DIAGNOSIS — M25559 Pain in unspecified hip: Secondary | ICD-10-CM | POA: Diagnosis not present

## 2016-07-24 DIAGNOSIS — H9193 Unspecified hearing loss, bilateral: Secondary | ICD-10-CM | POA: Diagnosis present

## 2016-07-24 DIAGNOSIS — S72142A Displaced intertrochanteric fracture of left femur, initial encounter for closed fracture: Principal | ICD-10-CM | POA: Diagnosis present

## 2016-07-24 DIAGNOSIS — Z8673 Personal history of transient ischemic attack (TIA), and cerebral infarction without residual deficits: Secondary | ICD-10-CM | POA: Diagnosis not present

## 2016-07-24 DIAGNOSIS — M6281 Muscle weakness (generalized): Secondary | ICD-10-CM | POA: Diagnosis not present

## 2016-07-24 DIAGNOSIS — R4182 Altered mental status, unspecified: Secondary | ICD-10-CM | POA: Diagnosis not present

## 2016-07-24 DIAGNOSIS — W19XXXD Unspecified fall, subsequent encounter: Secondary | ICD-10-CM | POA: Diagnosis not present

## 2016-07-24 DIAGNOSIS — J9811 Atelectasis: Secondary | ICD-10-CM | POA: Diagnosis not present

## 2016-07-24 DIAGNOSIS — R262 Difficulty in walking, not elsewhere classified: Secondary | ICD-10-CM | POA: Diagnosis not present

## 2016-07-24 DIAGNOSIS — R269 Unspecified abnormalities of gait and mobility: Secondary | ICD-10-CM | POA: Diagnosis present

## 2016-07-24 DIAGNOSIS — G8911 Acute pain due to trauma: Secondary | ICD-10-CM | POA: Diagnosis not present

## 2016-07-24 DIAGNOSIS — M549 Dorsalgia, unspecified: Secondary | ICD-10-CM | POA: Diagnosis not present

## 2016-07-24 DIAGNOSIS — S8991XA Unspecified injury of right lower leg, initial encounter: Secondary | ICD-10-CM | POA: Diagnosis not present

## 2016-07-24 DIAGNOSIS — M25562 Pain in left knee: Secondary | ICD-10-CM | POA: Diagnosis not present

## 2016-07-24 DIAGNOSIS — N400 Enlarged prostate without lower urinary tract symptoms: Secondary | ICD-10-CM | POA: Diagnosis not present

## 2016-07-24 DIAGNOSIS — N189 Chronic kidney disease, unspecified: Secondary | ICD-10-CM

## 2016-07-24 HISTORY — PX: INTRAMEDULLARY (IM) NAIL INTERTROCHANTERIC: SHX5875

## 2016-07-24 LAB — COMPREHENSIVE METABOLIC PANEL
ALBUMIN: 4.1 g/dL (ref 3.5–5.0)
ALT: 21 U/L (ref 17–63)
ANION GAP: 10 (ref 5–15)
AST: 20 U/L (ref 15–41)
Alkaline Phosphatase: 95 U/L (ref 38–126)
BILIRUBIN TOTAL: 0.5 mg/dL (ref 0.3–1.2)
BUN: 72 mg/dL — AB (ref 6–20)
CALCIUM: 8.7 mg/dL — AB (ref 8.9–10.3)
CO2: 27 mmol/L (ref 22–32)
CREATININE: 4.89 mg/dL — AB (ref 0.61–1.24)
Chloride: 101 mmol/L (ref 101–111)
GFR calc Af Amer: 11 mL/min — ABNORMAL LOW (ref >60)
GFR calc non Af Amer: 9 mL/min — ABNORMAL LOW (ref >60)
Glucose, Bld: 93 mg/dL (ref 65–99)
Potassium: 4.7 mmol/L (ref 3.5–5.1)
SODIUM: 138 mmol/L (ref 135–145)
TOTAL PROTEIN: 8 g/dL (ref 6.5–8.1)

## 2016-07-24 LAB — CBC WITH DIFFERENTIAL/PLATELET
Basophils Absolute: 0.1 10*3/uL (ref 0.0–0.1)
Basophils Relative: 1 %
EOS PCT: 1 %
Eosinophils Absolute: 0.1 10*3/uL (ref 0.0–0.7)
HEMATOCRIT: 32.1 % — AB (ref 39.0–52.0)
Hemoglobin: 10.4 g/dL — ABNORMAL LOW (ref 13.0–17.0)
Lymphocytes Relative: 10 %
Lymphs Abs: 1 10*3/uL (ref 0.7–4.0)
MCH: 29.6 pg (ref 26.0–34.0)
MCHC: 32.4 g/dL (ref 30.0–36.0)
MCV: 91.5 fL (ref 78.0–100.0)
MONO ABS: 0.3 10*3/uL (ref 0.1–1.0)
MONOS PCT: 3 %
NEUTROS ABS: 8.8 10*3/uL — AB (ref 1.7–7.7)
Neutrophils Relative %: 85 %
PLATELETS: 230 10*3/uL (ref 150–400)
RBC: 3.51 MIL/uL — ABNORMAL LOW (ref 4.22–5.81)
RDW: 15.7 % — AB (ref 11.5–15.5)
WBC: 10.2 10*3/uL (ref 4.0–10.5)

## 2016-07-24 LAB — CBC AND DIFFERENTIAL
HEMATOCRIT: 32 % — AB (ref 41–53)
Hemoglobin: 10.4 g/dL — AB (ref 13.5–17.5)
PLATELETS: 230 10*3/uL (ref 150–399)
WBC: 10.2 10*3/mL

## 2016-07-24 LAB — APTT: aPTT: 30 seconds (ref 24–36)

## 2016-07-24 LAB — PROTIME-INR
INR: 1.13
Prothrombin Time: 14.6 seconds (ref 11.4–15.2)
Protime: 14.6 seconds — AB (ref 10.0–13.8)

## 2016-07-24 LAB — URINALYSIS, ROUTINE W REFLEX MICROSCOPIC
BACTERIA UA: NONE SEEN
BILIRUBIN URINE: NEGATIVE
Glucose, UA: NEGATIVE mg/dL
Hgb urine dipstick: NEGATIVE
KETONES UR: NEGATIVE mg/dL
LEUKOCYTES UA: NEGATIVE
Nitrite: NEGATIVE
Protein, ur: 100 mg/dL — AB
SPECIFIC GRAVITY, URINE: 1.013 (ref 1.005–1.030)
SQUAMOUS EPITHELIAL / LPF: NONE SEEN
pH: 7 (ref 5.0–8.0)

## 2016-07-24 LAB — HEPATIC FUNCTION PANEL: BILIRUBIN, TOTAL: 0.5 mg/dL

## 2016-07-24 LAB — BASIC METABOLIC PANEL
BUN: 72 mg/dL — AB (ref 4–21)
Creatinine: 4.9 mg/dL — AB (ref 0.6–1.3)
GLUCOSE: 93 mg/dL
Potassium: 4.7 mmol/L (ref 3.4–5.3)
SODIUM: 138 mmol/L (ref 137–147)

## 2016-07-24 LAB — SURGICAL PCR SCREEN
MRSA, PCR: NEGATIVE
Staphylococcus aureus: NEGATIVE

## 2016-07-24 SURGERY — FIXATION, FRACTURE, INTERTROCHANTERIC, WITH INTRAMEDULLARY ROD
Anesthesia: General | Site: Hip | Laterality: Left

## 2016-07-24 MED ORDER — HYDROMORPHONE HCL 1 MG/ML IJ SOLN
0.2500 mg | INTRAMUSCULAR | Status: DC | PRN
  Administered 2016-07-24: 0.5 mg via INTRAVENOUS

## 2016-07-24 MED ORDER — FLUTICASONE PROPIONATE 50 MCG/ACT NA SUSP
1.0000 | Freq: Every day | NASAL | Status: DC
  Administered 2016-07-25 – 2016-07-29 (×5): 1 via NASAL
  Filled 2016-07-24: qty 16

## 2016-07-24 MED ORDER — METHOCARBAMOL 500 MG PO TABS: 500.0000 mg | ORAL_TABLET | Freq: Four times a day (QID) | ORAL | Status: DC | PRN

## 2016-07-24 MED ORDER — DEXTROSE 5 % IV SOLN
500.0000 mg | Freq: Four times a day (QID) | INTRAVENOUS | Status: DC | PRN
  Filled 2016-07-24: qty 5

## 2016-07-24 MED ORDER — SENNA 8.6 MG PO TABS
1.0000 | ORAL_TABLET | Freq: Two times a day (BID) | ORAL | Status: DC
  Administered 2016-07-25 – 2016-07-29 (×9): 8.6 mg via ORAL
  Filled 2016-07-24 (×9): qty 1

## 2016-07-24 MED ORDER — POLYETHYLENE GLYCOL 3350 17 G PO PACK: 17.0000 g | PACK | Freq: Every day | ORAL | Status: DC | PRN

## 2016-07-24 MED ORDER — SODIUM CHLORIDE 0.9 % IV SOLN
INTRAVENOUS | Status: DC
  Administered 2016-07-24 – 2016-07-28 (×3): via INTRAVENOUS

## 2016-07-24 MED ORDER — PHENYLEPHRINE HCL 10 MG/ML IJ SOLN
INTRAMUSCULAR | Status: DC | PRN
  Administered 2016-07-24: 40 ug/min via INTRAVENOUS

## 2016-07-24 MED ORDER — BOOST / RESOURCE BREEZE PO LIQD
1.0000 | Freq: Three times a day (TID) | ORAL | Status: DC
  Administered 2016-07-27 – 2016-07-29 (×4): 1 via ORAL

## 2016-07-24 MED ORDER — HYDROMORPHONE HCL 1 MG/ML IJ SOLN
INTRAMUSCULAR | Status: AC
  Filled 2016-07-24: qty 0.5

## 2016-07-24 MED ORDER — HYDROCODONE-ACETAMINOPHEN 5-325 MG PO TABS: 1.0000 | ORAL_TABLET | Freq: Four times a day (QID) | ORAL | 0 refills | Status: DC | PRN

## 2016-07-24 MED ORDER — VITAMIN D-3 25 MCG (1000 UT) PO CAPS: 1000.0000 [IU] | ORAL_CAPSULE | Freq: Every day | ORAL | Status: DC

## 2016-07-24 MED ORDER — SODIUM CHLORIDE (HYPERTONIC) 5 % OP OINT
1.0000 "application " | TOPICAL_OINTMENT | Freq: Every day | OPHTHALMIC | Status: DC
  Filled 2016-07-24: qty 3.5

## 2016-07-24 MED ORDER — MEMANTINE HCL ER 28 MG PO CP24
28.0000 mg | ORAL_CAPSULE | Freq: Every day | ORAL | Status: DC
  Administered 2016-07-25 – 2016-07-29 (×5): 28 mg via ORAL
  Filled 2016-07-24 (×6): qty 1

## 2016-07-24 MED ORDER — METHOCARBAMOL 1000 MG/10ML IJ SOLN: 500.0000 mg | Freq: Four times a day (QID) | INTRAVENOUS | Status: DC | PRN

## 2016-07-24 MED ORDER — METHOCARBAMOL 500 MG PO TABS
500.0000 mg | ORAL_TABLET | Freq: Four times a day (QID) | ORAL | Status: DC | PRN
  Administered 2016-07-26: 500 mg via ORAL
  Filled 2016-07-24: qty 1

## 2016-07-24 MED ORDER — POLYETHYL GLYCOL-PROPYL GLYCOL 0.4-0.3 % OP SOLN: 1.0000 [drp] | Freq: Three times a day (TID) | OPHTHALMIC | Status: DC

## 2016-07-24 MED ORDER — METOCLOPRAMIDE HCL 5 MG/ML IJ SOLN: 5.0000 mg | Freq: Three times a day (TID) | INTRAMUSCULAR | Status: DC | PRN

## 2016-07-24 MED ORDER — CHLORHEXIDINE GLUCONATE 4 % EX LIQD: 60.0000 mL | Freq: Once | CUTANEOUS | Status: DC

## 2016-07-24 MED ORDER — STERILE WATER FOR IRRIGATION IR SOLN
Status: DC | PRN
  Administered 2016-07-24: 1000 mL

## 2016-07-24 MED ORDER — 0.9 % SODIUM CHLORIDE (POUR BTL) OPTIME
TOPICAL | Status: DC | PRN
  Administered 2016-07-24: 1000 mL

## 2016-07-24 MED ORDER — MIRTAZAPINE 15 MG PO TABS
30.0000 mg | ORAL_TABLET | Freq: Every day | ORAL | Status: DC
  Administered 2016-07-25 – 2016-07-28 (×4): 30 mg via ORAL
  Filled 2016-07-24 (×4): qty 2

## 2016-07-24 MED ORDER — ACETAMINOPHEN 650 MG RE SUPP: 650.0000 mg | Freq: Four times a day (QID) | RECTAL | Status: DC | PRN

## 2016-07-24 MED ORDER — TAMSULOSIN HCL 0.4 MG PO CAPS
0.4000 mg | ORAL_CAPSULE | Freq: Every morning | ORAL | Status: DC
  Administered 2016-07-25 – 2016-07-29 (×5): 0.4 mg via ORAL
  Filled 2016-07-24 (×5): qty 1

## 2016-07-24 MED ORDER — POLYVINYL ALCOHOL 1.4 % OP SOLN
1.0000 [drp] | Freq: Three times a day (TID) | OPHTHALMIC | Status: DC
  Administered 2016-07-25 – 2016-07-29 (×12): 1 [drp] via OPHTHALMIC
  Filled 2016-07-24: qty 15

## 2016-07-24 MED ORDER — CEFAZOLIN SODIUM-DEXTROSE 2-4 GM/100ML-% IV SOLN
2.0000 g | INTRAVENOUS | Status: DC
  Filled 2016-07-24: qty 100

## 2016-07-24 MED ORDER — ROCURONIUM BROMIDE 100 MG/10ML IV SOLN
INTRAVENOUS | Status: DC | PRN
  Administered 2016-07-24: 50 mg via INTRAVENOUS

## 2016-07-24 MED ORDER — MEPERIDINE HCL 25 MG/ML IJ SOLN: 6.2500 mg | INTRAMUSCULAR | Status: DC | PRN

## 2016-07-24 MED ORDER — MORPHINE SULFATE (PF) 4 MG/ML IV SOLN: 0.5000 mg | INTRAVENOUS | Status: DC | PRN

## 2016-07-24 MED ORDER — ONDANSETRON HCL 4 MG/2ML IJ SOLN: 4.0000 mg | Freq: Once | INTRAMUSCULAR | Status: DC | PRN

## 2016-07-24 MED ORDER — FENTANYL CITRATE (PF) 100 MCG/2ML IJ SOLN
INTRAMUSCULAR | Status: DC | PRN
  Administered 2016-07-24: 100 ug via INTRAVENOUS

## 2016-07-24 MED ORDER — HYDROCODONE-ACETAMINOPHEN 5-325 MG PO TABS: 1.0000 | ORAL_TABLET | ORAL | Status: DC | PRN

## 2016-07-24 MED ORDER — ONDANSETRON HCL 4 MG PO TABS: 4.0000 mg | ORAL_TABLET | Freq: Four times a day (QID) | ORAL | Status: DC | PRN

## 2016-07-24 MED ORDER — EPHEDRINE SULFATE 50 MG/ML IJ SOLN
INTRAMUSCULAR | Status: DC | PRN
  Administered 2016-07-24 (×4): 5 mg via INTRAVENOUS

## 2016-07-24 MED ORDER — MORPHINE SULFATE (PF) 4 MG/ML IV SOLN: 1.0000 mg | INTRAVENOUS | Status: DC | PRN

## 2016-07-24 MED ORDER — VENLAFAXINE HCL ER 75 MG PO CP24
75.0000 mg | ORAL_CAPSULE | Freq: Every day | ORAL | Status: DC
Start: 2016-07-25 — End: 2016-07-29
  Administered 2016-07-26 – 2016-07-29 (×4): 75 mg via ORAL
  Filled 2016-07-24 (×5): qty 1

## 2016-07-24 MED ORDER — FENTANYL CITRATE (PF) 250 MCG/5ML IJ SOLN
INTRAMUSCULAR | Status: AC
  Filled 2016-07-24: qty 5

## 2016-07-24 MED ORDER — EPHEDRINE 5 MG/ML INJ
INTRAVENOUS | Status: AC
  Filled 2016-07-24: qty 10

## 2016-07-24 MED ORDER — BISACODYL 5 MG PO TBEC
5.0000 mg | DELAYED_RELEASE_TABLET | Freq: Every day | ORAL | Status: DC | PRN
  Administered 2016-07-28: 5 mg via ORAL
  Filled 2016-07-24: qty 1

## 2016-07-24 MED ORDER — LIDOCAINE HCL (CARDIAC) 20 MG/ML IV SOLN
INTRAVENOUS | Status: DC | PRN
  Administered 2016-07-24: 100 mg via INTRAVENOUS

## 2016-07-24 MED ORDER — PROPOFOL 10 MG/ML IV BOLUS
INTRAVENOUS | Status: AC
  Filled 2016-07-24: qty 20

## 2016-07-24 MED ORDER — LOTEPREDNOL ETABONATE 0.5 % OP SUSP
1.0000 [drp] | Freq: Every day | OPHTHALMIC | Status: DC
  Administered 2016-07-25 – 2016-07-29 (×6): 1 [drp] via OPHTHALMIC
  Filled 2016-07-24: qty 5

## 2016-07-24 MED ORDER — ASPIRIN EC 81 MG PO TBEC
81.0000 mg | DELAYED_RELEASE_TABLET | Freq: Two times a day (BID) | ORAL | 2 refills | Status: DC
Start: 2016-07-24 — End: 2016-07-30

## 2016-07-24 MED ORDER — SODIUM FLUORIDE 1.1 % DT CREA: 1.0000 "application " | TOPICAL_CREAM | Freq: Every evening | DENTAL | Status: DC

## 2016-07-24 MED ORDER — SUCCINYLCHOLINE CHLORIDE 200 MG/10ML IV SOSY
PREFILLED_SYRINGE | INTRAVENOUS | Status: AC
  Filled 2016-07-24: qty 10

## 2016-07-24 MED ORDER — ONDANSETRON HCL 4 MG/2ML IJ SOLN: 4.0000 mg | Freq: Four times a day (QID) | INTRAMUSCULAR | Status: DC | PRN

## 2016-07-24 MED ORDER — POVIDONE-IODINE 10 % EX SWAB
2.0000 "application " | Freq: Once | CUTANEOUS | Status: AC
  Administered 2016-07-24: 2 via TOPICAL

## 2016-07-24 MED ORDER — METOCLOPRAMIDE HCL 5 MG PO TABS: 5.0000 mg | ORAL_TABLET | Freq: Three times a day (TID) | ORAL | Status: DC | PRN

## 2016-07-24 MED ORDER — SUGAMMADEX SODIUM 200 MG/2ML IV SOLN
INTRAVENOUS | Status: DC | PRN
  Administered 2016-07-24: 200 mg via INTRAVENOUS

## 2016-07-24 MED ORDER — PROPOFOL 10 MG/ML IV BOLUS
INTRAVENOUS | Status: DC | PRN
  Administered 2016-07-24: 100 mg via INTRAVENOUS

## 2016-07-24 MED ORDER — ACETAMINOPHEN 325 MG PO TABS
650.0000 mg | ORAL_TABLET | Freq: Four times a day (QID) | ORAL | Status: DC | PRN
  Administered 2016-07-26 – 2016-07-28 (×4): 650 mg via ORAL
  Filled 2016-07-24 (×4): qty 2

## 2016-07-24 MED ORDER — FENTANYL CITRATE (PF) 100 MCG/2ML IJ SOLN
50.0000 ug | INTRAMUSCULAR | Status: DC | PRN
  Administered 2016-07-24: 50 ug via INTRAVENOUS
  Filled 2016-07-24: qty 2

## 2016-07-24 MED ORDER — FINASTERIDE 5 MG PO TABS
5.0000 mg | ORAL_TABLET | Freq: Every day | ORAL | Status: DC
  Administered 2016-07-25 – 2016-07-29 (×5): 5 mg via ORAL
  Filled 2016-07-24 (×5): qty 1

## 2016-07-24 MED ORDER — CEFAZOLIN SODIUM-DEXTROSE 1-4 GM/50ML-% IV SOLN
1.0000 g | Freq: Two times a day (BID) | INTRAVENOUS | Status: AC
  Administered 2016-07-24 – 2016-07-25 (×2): 1 g via INTRAVENOUS
  Filled 2016-07-24 (×2): qty 50

## 2016-07-24 MED ORDER — ONDANSETRON HCL 4 MG/2ML IJ SOLN
INTRAMUSCULAR | Status: AC
  Filled 2016-07-24: qty 2

## 2016-07-24 MED ORDER — SODIUM CHLORIDE 0.9 % IV SOLN
Freq: Once | INTRAVENOUS | Status: AC
  Administered 2016-07-24: 05:00:00 via INTRAVENOUS

## 2016-07-24 MED ORDER — FENTANYL CITRATE (PF) 100 MCG/2ML IJ SOLN
50.0000 ug | Freq: Once | INTRAMUSCULAR | Status: AC
  Administered 2016-07-24: 50 ug via NASAL
  Filled 2016-07-24: qty 2

## 2016-07-24 MED ORDER — HYDROCODONE-ACETAMINOPHEN 5-325 MG PO TABS
1.0000 | ORAL_TABLET | Freq: Four times a day (QID) | ORAL | Status: DC | PRN
  Administered 2016-07-25 (×2): 2 via ORAL
  Filled 2016-07-24 (×2): qty 2

## 2016-07-24 MED ORDER — ONDANSETRON HCL 4 MG/2ML IJ SOLN
INTRAMUSCULAR | Status: DC | PRN
  Administered 2016-07-24: 4 mg via INTRAVENOUS

## 2016-07-24 MED ORDER — AMLODIPINE BESYLATE 5 MG PO TABS
5.0000 mg | ORAL_TABLET | Freq: Every day | ORAL | Status: DC
  Administered 2016-07-25 – 2016-07-29 (×5): 5 mg via ORAL
  Filled 2016-07-24 (×4): qty 1

## 2016-07-24 MED ORDER — VITAMIN D 1000 UNITS PO TABS
1000.0000 [IU] | ORAL_TABLET | Freq: Every day | ORAL | Status: DC
  Administered 2016-07-25 – 2016-07-29 (×5): 1000 [IU] via ORAL
  Filled 2016-07-24 (×5): qty 1

## 2016-07-24 MED ORDER — SODIUM CHLORIDE 0.9 % IV SOLN
INTRAVENOUS | Status: DC
  Administered 2016-07-24: 10:00:00 via INTRAVENOUS

## 2016-07-24 MED ORDER — ASPIRIN 81 MG PO CHEW
81.0000 mg | CHEWABLE_TABLET | Freq: Two times a day (BID) | ORAL | Status: DC
  Administered 2016-07-25 – 2016-07-26 (×4): 81 mg via ORAL
  Filled 2016-07-24 (×4): qty 1

## 2016-07-24 SURGICAL SUPPLY — 50 items
BIT DRILL CANN LG 4.3MM (BIT) ×1 IMPLANT
BLADE SURG 15 STRL LF DISP TIS (BLADE) ×1 IMPLANT
BLADE SURG 15 STRL SS (BLADE) ×2
BNDG COHESIVE 4X5 TAN NS LF (GAUZE/BANDAGES/DRESSINGS) ×3 IMPLANT
BNDG COHESIVE 6X5 TAN STRL LF (GAUZE/BANDAGES/DRESSINGS) IMPLANT
BNDG CONFORM 3 STRL LF (GAUZE/BANDAGES/DRESSINGS) ×3 IMPLANT
BNDG GAUZE ELAST 4 BULKY (GAUZE/BANDAGES/DRESSINGS) ×3 IMPLANT
COVER PERINEAL POST (MISCELLANEOUS) ×3 IMPLANT
COVER SURGICAL LIGHT HANDLE (MISCELLANEOUS) ×3 IMPLANT
DRAPE HALF SHEET 40X57 (DRAPES) IMPLANT
DRAPE STERI IOBAN 125X83 (DRAPES) ×3 IMPLANT
DRILL BIT CANN LG 4.3MM (BIT) ×3
DRSG MEPILEX BORDER 4X4 (GAUZE/BANDAGES/DRESSINGS) ×3 IMPLANT
DRSG MEPILEX BORDER 4X8 (GAUZE/BANDAGES/DRESSINGS) ×3 IMPLANT
DRSG PAD ABDOMINAL 8X10 ST (GAUZE/BANDAGES/DRESSINGS) ×6 IMPLANT
DURAPREP 26ML APPLICATOR (WOUND CARE) ×3 IMPLANT
ELECT CAUTERY BLADE 6.4 (BLADE) ×3 IMPLANT
ELECT REM PT RETURN 9FT ADLT (ELECTROSURGICAL) ×3
ELECTRODE REM PT RTRN 9FT ADLT (ELECTROSURGICAL) ×1 IMPLANT
FACESHIELD WRAPAROUND (MASK) ×3 IMPLANT
GAUZE SPONGE 4X4 12PLY STRL (GAUZE/BANDAGES/DRESSINGS) ×3 IMPLANT
GAUZE XEROFORM 1X8 LF (GAUZE/BANDAGES/DRESSINGS) ×3 IMPLANT
GAUZE XEROFORM 5X9 LF (GAUZE/BANDAGES/DRESSINGS) ×3 IMPLANT
GLOVE BIO SURGEON STRL SZ7.5 (GLOVE) ×3 IMPLANT
GLOVE BIOGEL PI IND STRL 8 (GLOVE) ×1 IMPLANT
GLOVE BIOGEL PI INDICATOR 8 (GLOVE) ×2
GOWN STRL REUS W/ TWL LRG LVL3 (GOWN DISPOSABLE) ×2 IMPLANT
GOWN STRL REUS W/ TWL XL LVL3 (GOWN DISPOSABLE) ×1 IMPLANT
GOWN STRL REUS W/TWL LRG LVL3 (GOWN DISPOSABLE) ×4
GOWN STRL REUS W/TWL XL LVL3 (GOWN DISPOSABLE) ×2
GUIDEPIN 3.2X17.5 THRD DISP (PIN) ×6 IMPLANT
KIT BASIN OR (CUSTOM PROCEDURE TRAY) ×3 IMPLANT
KIT ROOM TURNOVER OR (KITS) ×3 IMPLANT
LINER BOOT UNIVERSAL DISP (MISCELLANEOUS) ×3 IMPLANT
MANIFOLD NEPTUNE II (INSTRUMENTS) ×3 IMPLANT
NAIL HIP FRACT 130D 11X180 (Screw) ×3 IMPLANT
NS IRRIG 1000ML POUR BTL (IV SOLUTION) ×3 IMPLANT
PACK GENERAL/GYN (CUSTOM PROCEDURE TRAY) ×3 IMPLANT
PAD ARMBOARD 7.5X6 YLW CONV (MISCELLANEOUS) ×6 IMPLANT
PAD CAST 4YDX4 CTTN HI CHSV (CAST SUPPLIES) ×2 IMPLANT
PADDING CAST COTTON 4X4 STRL (CAST SUPPLIES) ×4
SCREW ANTI ROTATION 80MM (Screw) ×3 IMPLANT
SCREW BONE CORTICAL 5.0X36 (Screw) ×3 IMPLANT
SCREW DRILL BIT ANIT ROTATION (BIT) ×3 IMPLANT
SCREW LAG HIP NAIL 10.5X95 (Screw) ×3 IMPLANT
STAPLER VISISTAT 35W (STAPLE) ×3 IMPLANT
SUT MON AB 2-0 CT1 36 (SUTURE) ×3 IMPLANT
TOWEL OR 17X24 6PK STRL BLUE (TOWEL DISPOSABLE) ×3 IMPLANT
TOWEL OR 17X26 10 PK STRL BLUE (TOWEL DISPOSABLE) ×3 IMPLANT
WATER STERILE IRR 1000ML POUR (IV SOLUTION) ×3 IMPLANT

## 2016-07-24 NOTE — Anesthesia Procedure Notes (Signed)
Procedure Name: Intubation Date/Time: 07/24/2016 11:18 AM Performed by: Carney Living Pre-anesthesia Checklist: Patient identified, Emergency Drugs available, Suction available, Patient being monitored and Timeout performed Patient Re-evaluated:Patient Re-evaluated prior to inductionOxygen Delivery Method: Circle system utilized Preoxygenation: Pre-oxygenation with 100% oxygen Intubation Type: IV induction Ventilation: Mask ventilation without difficulty and Oral airway inserted - appropriate to patient size Laryngoscope Size: Mac Grade View: Grade I Tube type: Oral Tube size: 7.5 mm Number of attempts: 1 Airway Equipment and Method: Stylet Placement Confirmation: ETT inserted through vocal cords under direct vision,  positive ETCO2 and breath sounds checked- equal and bilateral Secured at: 22 cm Tube secured with: Tape Dental Injury: Teeth and Oropharynx as per pre-operative assessment

## 2016-07-24 NOTE — Anesthesia Postprocedure Evaluation (Signed)
Anesthesia Post Note  Patient: Vincent Potts  Procedure(s) Performed: Procedure(s) (LRB): INTRAMEDULLARY (IM) NAIL INTERTROCHANTRIC (Left)  Patient location during evaluation: PACU Anesthesia Type: General Level of consciousness: awake and alert Pain management: pain level controlled Vital Signs Assessment: post-procedure vital signs reviewed and stable Respiratory status: spontaneous breathing, nonlabored ventilation, respiratory function stable and patient connected to nasal cannula oxygen Cardiovascular status: blood pressure returned to baseline and stable Postop Assessment: no signs of nausea or vomiting Anesthetic complications: no       Last Vitals:  Vitals:   07/24/16 0830 07/24/16 1249  BP: (!) 181/82 (!) 154/76  Pulse: 69 61  Resp: 12 13  Temp:  36.5 C    Last Pain:  Vitals:   07/24/16 1249  TempSrc:   PainSc: [place] DAVID

## 2016-07-24 NOTE — ED Provider Notes (Signed)
[facility] DEPT Provider Note   CSN: [id number] Arrival date & time: 07/24/16  0347  Time seen 04:22 AM   History   Chief Complaint Chief Complaint  Patient presents with  . Fall  . Knee Pain   Level V caveat for dementia  HPI Vincent Potts is a 83 y.o. male.  HPI  patient had a fall at his nursing facility. He evidently told EMS he was having pain in his left knee, however he tells me he has pain in his left hip.  PCP Estill Dooms, MD  Patient is DO NOT RESUSCITATE  Past Medical History:  Diagnosis Date  . Anemia   . Anxiety   . Back pain   . Cataract   . Chronic kidney disease 2013   rhabdo  . CKD (chronic kidney disease) stage 3, GFR 30-59 ml/min 08/29/2012   Creatinine 1.88 07/05/13(baseline 1.5-2.0) 02/11/14 creatinine 2.00 05/16/14 creatinine 2.31 07/18/14 creatinine 2.22     . CVA (cerebral infarction)   . Cystic disease of liver   . Dementia with behavioral disturbance 06/06/2012  . Depression 06/06/2012  . Diverticulosis   . Edema   . Gait disorder   . GERD (gastroesophageal reflux disease)   . H/O hiatal hernia 2008   repaired  . Hearing loss   . Hearing loss   . Hypertension   . Hyponatremia   . Inguinal hernia   . Keratosis   . Paresthesia   . Parkinsonism ([place])   . PVC (premature ventricular contraction)   . Sciatica   . Sciatica   . Stroke [facility]) 2010   no deficits  . Thyroid disease   . Vitamin D deficiency disease     Patient Active Problem List   Diagnosis Date Noted  . Adult failure to thrive 07/20/2016  . Otitis externa 06/15/2016  . Blepharitis of both eyes 06/11/2016  . Epistaxis 05/11/2016  . Loss of weight 02/10/2016  . UTI (urinary tract infection) 12/16/2015  . Anemia in chronic kidney disease 06/12/2015  . AK (actinic keratosis) 03/19/2014  . Sinusitis, chronic 01/08/2014  . Insomnia 06/08/2013  . CKD stage 4 secondary to hypertension ([place]) 08/29/2012  . Depression 06/06/2012  . Dementia with behavioral disturbance  06/06/2012  . Constipation 06/06/2012  . BPH (benign prostatic hyperplasia) 06/06/2012  . Nephrolithiasis 10/25/2011  . Hyponatremia 10/24/2011  . Fall 04/19/2011  . Parkinsonism ([place])   . CVA (cerebral infarction)   . Hypertension   . Back pain   . Hearing loss   . Gait disorder   . Diverticulosis   . GERD (gastroesophageal reflux disease)     Past Surgical History:  Procedure Laterality Date  . APPENDECTOMY    . ESOPHAGOGASTRODUODENOSCOPY  10/25/2011   Procedure: ESOPHAGOGASTRODUODENOSCOPY (EGD);  Surgeon: Winfield Cunas., MD;  Location: [facility] ENDOSCOPY;  Service: Endoscopy;  Laterality: N/A;  . HERNIA REPAIR    . PROSTATE SURGERY     cancer       Home Medications    Prior to Admission medications   Medication Sig Start Date End Date Taking? Authorizing Provider  acetaminophen (TYLENOL) 325 MG tablet Take 650 mg by mouth every 4 (four) hours as needed for mild pain or moderate pain.    Yes [provider]  acetaminophen (TYLENOL) 500 MG tablet Take 500 mg by mouth at bedtime.    Yes [provider]  amLODipine (NORVASC) 5 MG tablet Take 5 mg by mouth daily.    Yes [provider]  bisacodyl (DULCOLAX) 10 MG suppository Place 10 mg rectally daily as needed for moderate constipation.   Yes [provider]  Cholecalciferol (VITAMIN D-3) 1000 units CAPS Take 1,000 Units by mouth daily.    Yes [provider]  finasteride (PROSCAR) 5 MG tablet Take 5 mg by mouth daily.   Yes [provider]  fluticasone (FLONASE) 50 MCG/ACT nasal spray Place 1 spray into both nostrils daily.   Yes [provider]  loteprednol (LOTEMAX) 0.5 % ophthalmic suspension Place 1 drop into the left eye daily.   Yes [provider]  magnesium hydroxide (MILK OF MAGNESIA) 400 MG/5ML suspension Take 30 mLs by mouth daily as needed for mild constipation.  10/28/11  Yes Velvet Bathe, MD  Melatonin 3 MG TABS Take 3 mg by mouth at bedtime.     Yes [provider]  memantine (NAMENDA XR) 28 MG CP24 24 hr capsule Take 28 mg by mouth daily. Take one daily for memory   Yes [provider]  mirtazapine (REMERON) 30 MG tablet Take 30 mg by mouth at bedtime.   Yes [provider]  Nutritional Supplements (RESOURCE BREEZE PO) Take 120 mLs by mouth 4 (four) times daily. nutritional supplement   Yes [provider]  Omega-3 Fatty Acids (FISH OIL) 1000 MG CAPS Take 2,000 mg by mouth daily.    Yes [provider]  Polyethyl Glycol-Propyl Glycol (SYSTANE) 0.4-0.3 % SOLN Place 1 drop into both eyes 3 (three) times daily.   Yes [provider]  senna (SENOKOT) 8.6 MG tablet Take 2 tablets by mouth daily.    Yes [provider]  sodium chloride (MURO 128) 2 % ophthalmic solution Place 1 drop into the left eye 3 (three) times daily.    Yes [provider]  sodium chloride (MURO 128) 5 % ophthalmic ointment Place 1 application into the left eye at bedtime.    Yes [provider]  sodium fluoride (PREVIDENT 5000 PLUS) 1.1 % CREA dental cream Place 1 application onto teeth every evening.   Yes [provider]  sulfamethoxazole-trimethoprim (BACTRIM DS,SEPTRA DS) 800-160 MG tablet Take 1 tablet by mouth 2 (two) times daily.   Yes [provider]  Tamsulosin HCl (FLOMAX) 0.4 MG CAPS Take 0.4 mg by mouth every morning.    Yes [provider]  venlafaxine XR (EFFEXOR-XR) 75 MG 24 hr capsule Take 75 mg by mouth daily with breakfast.   Yes [provider]    Family History Family History  Problem Relation Age of Onset  . Hypertension Mother     Social History Social History  Substance Use Topics  . Smoking status: Never Smoker  . Smokeless tobacco: Never Used  . Alcohol use No  lives in NH   Allergies   Olanzapine and Rivastigmine   Review of Systems Review of Systems  Unable to perform ROS: Dementia     Physical  Exam Updated Vital Signs BP (!) 182/80 (BP Location: Left Arm)   Pulse 65   Temp 97.2 F (36.2 C) (Oral)   Resp 16   Ht 5\' 5"  (1.651 m)   Wt 59 kg (130 lb)   SpO2 99%   BMI 21.63 kg/m   Vital signs normal except for hypertension   Physical Exam  Constitutional: He is oriented to person, place, and time.  Non-toxic appearance. He does not appear ill. No distress.  Frail elderly man complaining of pain in his left hip  HENT:  Head: Normocephalic  and atraumatic.  Right Ear: External ear normal.  Left Ear: External ear normal.  Nose: Nose normal. No mucosal edema or rhinorrhea.  Mouth/Throat: Mucous membranes are dry. No dental abscesses or uvula swelling.  Mucous membranes dry  Eyes: Conjunctivae and EOM are normal. Pupils are equal, round, and reactive to light.  Neck: Normal range of motion and full passive range of motion without pain. Neck supple.  Cardiovascular: Normal rate, regular rhythm and normal heart sounds.  Exam reveals no gallop and no friction rub.   No murmur heard. Pulmonary/Chest: Effort normal and breath sounds normal. No respiratory distress. He has no wheezes. He has no rhonchi. He has no rales. He exhibits no tenderness and no crepitus.  Abdominal: Soft. Normal appearance and bowel sounds are normal. He exhibits no distension. There is no tenderness. There is no rebound and no guarding.  Musculoskeletal: Normal range of motion. He exhibits no edema or tenderness.  Patient moves his upper extremities without difficulty, when I look at his lower extremities his left lower extremity aches externally rotated, however his right leg is slightly bent at the knee and appears shorter. When I try to flex his knee and his hip he complains of pain in his left hip. Marland Kitchen He does not appear painful when I palpate his knee. There is no obvious deformity of the knee.  Neurological: He is alert and oriented to person, place, and time. He has normal strength. No cranial nerve deficit.   Skin: Skin is warm, dry and intact. No rash noted. No erythema. No pallor.  Psychiatric: He has a normal mood and affect. His speech is normal and behavior is normal. His mood appears not anxious.  Nursing note and vitals reviewed.        ED Treatments / Results  Labs (all labs ordered are listed, but only abnormal results are displayed) Results for orders placed or performed during the hospital encounter of 07/24/16  Comprehensive metabolic panel  Result Value Ref Range   Sodium 138 135 - 145 mmol/L   Potassium 4.7 3.5 - 5.1 mmol/L   Chloride 101 101 - 111 mmol/L   CO2 27 22 - 32 mmol/L   Glucose, Bld 93 65 - 99 mg/dL   BUN 72 (H) 6 - 20 mg/dL   Creatinine, Ser 4.89 (H) 0.61 - 1.24 mg/dL   Calcium 8.7 (L) 8.9 - 10.3 mg/dL   Total Protein 8.0 6.5 - 8.1 g/dL   Albumin 4.1 3.5 - 5.0 g/dL   AST 20 15 - 41 U/L   ALT 21 17 - 63 U/L   Alkaline Phosphatase 95 38 - 126 U/L   Total Bilirubin 0.5 0.3 - 1.2 mg/dL   GFR calc non Af Amer 9 (L) >60 mL/min   GFR calc Af Amer 11 (L) >60 mL/min   Anion gap 10 5 - 15  CBC with Differential  Result Value Ref Range   WBC 10.2 4.0 - 10.5 K/uL   RBC 3.51 (L) 4.22 - 5.81 MIL/uL   Hemoglobin 10.4 (L) 13.0 - 17.0 g/dL   HCT 32.1 (L) 39.0 - 52.0 %   MCV 91.5 78.0 - 100.0 fL   MCH 29.6 26.0 - 34.0 pg   MCHC 32.4 30.0 - 36.0 g/dL   RDW 15.7 (H) 11.5 - 15.5 %   Platelets 230 150 - 400 K/uL   Neutrophils Relative % 85 %   Neutro Abs 8.8 (H) 1.7 - 7.7 K/uL   Lymphocytes Relative 10 %  Lymphs Abs 1.0 0.7 - 4.0 K/uL   Monocytes Relative 3 %   Monocytes Absolute 0.3 0.1 - 1.0 K/uL   Eosinophils Relative 1 %   Eosinophils Absolute 0.1 0.0 - 0.7 K/uL   Basophils Relative 1 %   Basophils Absolute 0.1 0.0 - 0.1 K/uL  Protime-INR  Result Value Ref Range   Prothrombin Time 14.6 11.4 - 15.2 seconds   INR 1.13   APTT  Result Value Ref Range   aPTT 30 24 - 36 seconds  Urinalysis, Routine w reflex microscopic  Result Value Ref Range   Color,  Urine YELLOW YELLOW   APPearance CLEAR CLEAR   Specific Gravity, Urine 1.013 1.005 - 1.030   pH 7.0 5.0 - 8.0   Glucose, UA NEGATIVE NEGATIVE mg/dL   Hgb urine dipstick NEGATIVE NEGATIVE   Bilirubin Urine NEGATIVE NEGATIVE   Ketones, ur NEGATIVE NEGATIVE mg/dL   Protein, ur 100 (A) NEGATIVE mg/dL   Nitrite NEGATIVE NEGATIVE   Leukocytes, UA NEGATIVE NEGATIVE   RBC / HPF 0-5 0 - 5 RBC/hpf   WBC, UA 0-5 0 - 5 WBC/hpf   Bacteria, UA NONE SEEN NONE SEEN   Squamous Epithelial / LPF NONE SEEN NONE SEEN   Hyaline Casts, UA PRESENT    Laboratory interpretation all normal except Stable renal failure, mild anemia    EKG  EKG Interpretation  Date/Time:  Saturday Jul 24 2016 05:13:43 EDT Ventricular Rate:  71 PR Interval:    QRS Duration: 90 QT Interval:  380 QTC Calculation: 413 R Axis:   -14 Text Interpretation:  Sinus rhythm Prolonged PR interval Left ventricular hypertrophy Inferior infarct, old Anterior Q waves, possibly due to LVH Baseline wander Confirmed by Rolland Porter [phone]) on 07/24/2016 6:59:15 AM       Radiology Dg Chest 1 View  Result Date: 07/24/2016 CLINICAL DATA:  Left hip fracture.  History of hypertension. EXAM: CHEST 1 VIEW COMPARISON:  10/24/2011 FINDINGS: Shallow inspiration with linear atelectasis in the lung bases. Heart size and pulmonary vascularity are normal. No consolidation. No blunting of costophrenic angles. No pneumothorax. Calcified and tortuous aorta. Old bilateral rib fractures. IMPRESSION: Shallow inspiration with atelectasis in the lung bases. Electronically Signed   By: Lucienne Capers M.D.   On: 07/24/2016 04:55   Dg Knee Complete 4 Views Left  Result Date: 07/24/2016 CLINICAL DATA:  Knee swelling, pain. EXAM: LEFT KNEE - COMPLETE 4+ VIEW COMPARISON:  None. FINDINGS: No acute fracture deformity or dislocation. Severe osteopenia without destructive bony lesions. No advanced degenerative change for age. Severe vascular calcifications. Soft tissue  planes are otherwise unremarkable. IMPRESSION: No acute osseous process. Osteopenia which decreases sensitivity for acute nondisplaced fractures. Electronically Signed   By: Elon Alas M.D.   On: 07/24/2016 04:27   Dg Hip Unilat With Pelvis 2-3 Views Left  Result Date: 07/24/2016 CLINICAL DATA:  Unwitnessed fall.  Left hip pain. EXAM: DG HIP (WITH OR WITHOUT PELVIS) 2-3V LEFT COMPARISON:  None. FINDINGS: There is an acute comminuted fracture of the inter trochanteric left hip with varus angulation. Displaced greater trochanteric fragment. No dislocation of the hip. Pelvis and right hip appear intact. Degenerative changes in the lower lumbar spine. Vascular calcifications. IMPRESSION: Acute comminuted inter trochanteric fracture of the left hip with varus angulation. Electronically Signed   By: Lucienne Capers M.D.   On: 07/24/2016 04:54    Procedures Procedures (including critical care time)  Medications Ordered in ED Medications  fentaNYL (SUBLIMAZE) injection 50 mcg (not administered)  fentaNYL (SUBLIMAZE) injection 50 mcg (50 mcg Nasal Given 07/24/16 0453)  0.9 %  sodium chloride infusion ( Intravenous New Bag/Given 07/24/16 0528)     Initial Impression / Assessment and Plan / ED Course  I have reviewed the triage vital signs and the nursing notes.  Pertinent labs & imaging results that were available during my care of the patient were reviewed by me and considered in my medical decision making (see chart for details).  X-ray of the knee had a repeat ordered by nursing staff, I ordered a left hip x-ray. Patient was made nothing by mouth. After reviewing his x-ray 1 view chest x-ray was done in radiology. Patient was given intranasal fentanyl until his x-ray was done. After reviewing his x-ray report he had an IV inserted, preop testing was ordered.  6:35 AM I spoke to patient's power of attorney, and medical power of attorney, his nephew Emrick Hensch, he agrees that we should proceed  the surgery, he is about 2 hours away but will leave shortly to come to [facility]. His cell phone number is [phone]. He states his uncle uses a walker.  06:47 AM Dr Doran Durand, states to keep NPO, will most likely go to the OR today.   06:56 AM Dr Fuller Plan, hospitalist, advised will most likely go to OR today, keep NPO and no blood thinners  06:59 AM Dr Doran Durand, called back, wants [facility] to [facility] for Dr Victorino December who will operate this afternoon  07:14 AM Dr Sloan Leiter, hositalist, advised Dr Doran Durand wants patient to be admitted at [facility] for surgery this afternoon  Final Clinical Impressions(s) / ED Diagnoses   Final diagnoses:  Pain in joint of left hip  Fall at nursing home, initial encounter  Hip fracture, left, closed, initial encounter [facility])  Chronic renal failure, unspecified CKD stage   Plan admission  Rolland Porter, MD, Barbette Or, MD 07/24/16 [phone]

## 2016-07-24 NOTE — Anesthesia Preprocedure Evaluation (Addendum)
Anesthesia Evaluation  Patient identified by MRN, date of birth, ID band Patient awake    Reviewed: Allergy & Precautions, NPO status , Patient's Chart, lab work & pertinent test results  Airway Mallampati: I  TM Distance: >3 FB Neck ROM: Full    Dental  (+) Teeth Intact, Dental Advisory Given   Pulmonary    Pulmonary exam normal        Cardiovascular hypertension, Pt. on medications Normal cardiovascular exam     Neuro/Psych Anxiety Depression DementiaCVA, No Residual Symptoms    GI/Hepatic GERD  Medicated and Controlled,  Endo/Other    Renal/GU Renal InsufficiencyRenal disease     Musculoskeletal   Abdominal   Peds  Hematology   Anesthesia Other Findings   Reproductive/Obstetrics                            Anesthesia Physical Anesthesia Plan  ASA: III  Anesthesia Plan: General   Post-op Pain Management:    Induction: Intravenous  Airway Management Planned: Oral ETT  Additional Equipment:   Intra-op Plan:   Post-operative Plan: Extubation in OR  Informed Consent: I have reviewed the patients History and Physical, chart, labs and discussed the procedure including the risks, benefits and alternatives for the proposed anesthesia with the patient or authorized representative who has indicated his/her understanding and acceptance.   Dental advisory given  Plan Discussed with: CRNA and Surgeon  Anesthesia Plan Comments:        Anesthesia Quick Evaluation

## 2016-07-24 NOTE — Op Note (Signed)
Date of Surgery: 07/24/2016  INDICATIONS: Mr. Furey is a 83 y.o.-year-old male who sustained a left hip fracture. He sustained a ground-level fall at his nursing home earlier this morning. He was brought to the [facility] emergency Department and consult was offered to my partner Dr. Doran Durand. I was available for operative management of this today and care was transferred to me early this morning. The patient has fairly involved dementia at baseline and is unable to consent for himself. I discussed with his power of attorney which is his nephew the necessity of operative management of left intertrochanteric hip fracture as well as the risks benefits and indications of the procedure. The risks and benefits of the procedure discussed with the family and Healthcare power of attorney. prior to the procedure and all questions were answered; consent was obtained.  Risks discussed include but are not limited to bleeding, infection, damage to surrounding nerves and arteries, malunion, nonunion, persistent pain, need for further surgery, blood clots, risk of anesthesia.  We did also discuss the around 30% preoperative mortality rate with this fracture.  PREOPERATIVE DIAGNOSIS: left hip fracture   POSTOPERATIVE DIAGNOSIS: Same   PROCEDURE: Treatment of intertrochanteric, pertrochanteric, subtrochanteric fracture with intramedullary implant. CPT [id number]   SURGEON: Dannielle Karvonen. [name], M.D.   ANESTHESIA: general   IV FLUIDS AND URINE: See anesthesia record   ESTIMATED BLOOD LOSS: 100 cc  IMPLANTS:   Biomet  Affixus Hip Trochanteric nail 11 mm x 180 mm 130 degree angle 10.5 x 95 mm compression screw 80 mm anit-rotational screw 5.0 mm distal interlock  DRAINS: None.   COMPLICATIONS: None.   DESCRIPTION OF PROCEDURE: The patient was brought to the operating room and placed supine on the operating table. The patient's leg had been signed prior to the procedure. The patient had the anesthesia placed by  the anesthesiologist. The prep verification and incision time-outs were performed to confirm that this was the correct patient, site, side and location. The patient had an SCD on the opposite lower extremity. The patient did receive antibiotics prior to the incision and was re-dosed during the procedure as needed at indicated intervals. The patient was positioned on the fracture table with the table in traction and internal rotation to reduce the hip. The well leg was placed in a scissor position and all bony prominences were well-padded.  Prior to prepping and draping we did perform a reduction maneuver of the left hip which was identified on 2 views with fluoroscopy to be well aligned and reduced.   The patient had the lower extremity prepped and draped in the standard surgical fashion. The incision was made 4 finger breadths superior to the greater trochanter. A guide pin was inserted into the tip of the greater trochanter under fluoroscopic guidance. An opening reamer was used to gain access to the femoral canal. The nail length was measured and inserted down the femoral canal to its proper depth. The appropriate version of insertion for the lag screw was found under fluoroscopy. A pin was inserted up the femoral neck through the jig. Then, a second antirotation pin was inserted superior to the first pin. The length of the lag screw was then measured. The lag screw was inserted as near to center-center in the head as possible. The antirotation pin was then taken out and an anti-rotation screw was placed in its place. The leg was taken out of traction, and the set screw was advanced through the nail to lock the anti-rotation and  compression screw to the nail. The wound was copiously irrigated with saline and the subcutaneous layer closed with 2.0 vicryl and the skin was reapproximated with staples. The wounds were cleaned and dried a final time and a sterile dressing was placed. The hip was taken through a  range of motion at the end of the case under fluoroscopic imaging to visualize the approach-withdraw phenomenon and confirm implant length in the head. The patient was then awakened from anesthesia and taken to the recovery room in stable condition. All counts were correct at the end of the case.   POSTOPERATIVE PLAN: The patient will be 50% weight bearing as tolerated and will return in 2 weeks for staple removal and the patient will receive DVT prophylaxis based on other medications, activity level, and risk ratio of bleeding to thrombosis.  Our recommendation will be for twice daily 81 mg aspirin for 4 weeks postoperatively.   Geralynn Rile, [place] [phone] 2:37 PM

## 2016-07-24 NOTE — Consult Note (Signed)
ORTHOPAEDIC CONSULTATION  REQUESTING PHYSICIAN: Ghimire, Henreitta Leber, MD  PCP:  Estill Dooms, MD  Chief Complaint: Left hip fracture  HPI: Vincent Potts is a 83 y.o. male who complains of pain following a mechanical fall at his assisted living facility early this am.  He has severe dementia and the history was obtained from chart review and discussion with health care providers.  He has no other injuries, than his left IT hip fracture.  This is a closed injury.  He was walking without assistance prior to the fall.  Past Medical History:  Diagnosis Date  . Anemia   . Anxiety   . Back pain   . Cataract   . Chronic kidney disease 2013   rhabdo  . CKD (chronic kidney disease) stage 3, GFR 30-59 ml/min 08/29/2012   Creatinine 1.88 07/05/13(baseline 1.5-2.0) 02/11/14 creatinine 2.00 05/16/14 creatinine 2.31 07/18/14 creatinine 2.22     . CVA (cerebral infarction)   . Cystic disease of liver   . Dementia with behavioral disturbance 06/06/2012  . Depression 06/06/2012  . Diverticulosis   . Edema   . Gait disorder   . GERD (gastroesophageal reflux disease)   . H/O hiatal hernia 2008   repaired  . Hearing loss   . Hearing loss   . Hypertension   . Hyponatremia   . Inguinal hernia   . Keratosis   . Paresthesia   . Parkinsonism ([place])   . PVC (premature ventricular contraction)   . Sciatica   . Sciatica   . Stroke [facility]) 2010   no deficits  . Thyroid disease   . Vitamin D deficiency disease    Past Surgical History:  Procedure Laterality Date  . APPENDECTOMY    . ESOPHAGOGASTRODUODENOSCOPY  10/25/2011   Procedure: ESOPHAGOGASTRODUODENOSCOPY (EGD);  Surgeon: Winfield Cunas., MD;  Location: [facility] ENDOSCOPY;  Service: Endoscopy;  Laterality: N/A;  . HERNIA REPAIR    . PROSTATE SURGERY     cancer   Social History   Social History  . Marital status: Single    Spouse name: N/A  . Number of children: N/A  . Years of education: N/A   Social History Main Topics  . Smoking  status: Never Smoker  . Smokeless tobacco: Never Used  . Alcohol use No  . Drug use: No  . Sexual activity: No   Other Topics Concern  . Not on file   Social History Narrative   Retired   Resides in skilled nursing facility at [facility] since 08/02/2012   Never married   Never smoked   Alcohol none   DNR, MOST, POA   Family History  Problem Relation Age of Onset  . Hypertension Mother    Allergies  Allergen Reactions  . Olanzapine Other (See Comments)    unknown  . Rivastigmine Nausea Only   Prior to Admission medications   Medication Sig Start Date End Date Taking? Authorizing Provider  acetaminophen (TYLENOL) 325 MG tablet Take 650 mg by mouth every 4 (four) hours as needed for mild pain or moderate pain.    Yes [provider]  acetaminophen (TYLENOL) 500 MG tablet Take 500 mg by mouth at bedtime.    Yes [provider]  amLODipine (NORVASC) 5 MG tablet Take 5 mg by mouth daily.    Yes [provider]  bisacodyl (DULCOLAX) 10 MG suppository Place 10 mg rectally daily as needed for moderate constipation.   Yes [provider]  Cholecalciferol (VITAMIN  D-3) 1000 units CAPS Take 1,000 Units by mouth daily.    Yes [provider]  finasteride (PROSCAR) 5 MG tablet Take 5 mg by mouth daily.   Yes [provider]  fluticasone (FLONASE) 50 MCG/ACT nasal spray Place 1 spray into both nostrils daily.   Yes [provider]  loteprednol (LOTEMAX) 0.5 % ophthalmic suspension Place 1 drop into the left eye daily.   Yes [provider]  magnesium hydroxide (MILK OF MAGNESIA) 400 MG/5ML suspension Take 30 mLs by mouth daily as needed for mild constipation.  10/28/11  Yes Velvet Bathe, MD  Melatonin 3 MG TABS Take 3 mg by mouth at bedtime.    Yes [provider]  memantine (NAMENDA XR) 28 MG CP24 24 hr capsule Take 28 mg by mouth daily. Take one daily for memory   Yes [provider]    mirtazapine (REMERON) 30 MG tablet Take 30 mg by mouth at bedtime.   Yes [provider]  Nutritional Supplements (RESOURCE BREEZE PO) Take 120 mLs by mouth 4 (four) times daily. nutritional supplement   Yes [provider]  Omega-3 Fatty Acids (FISH OIL) 1000 MG CAPS Take 2,000 mg by mouth daily.    Yes [provider]  Polyethyl Glycol-Propyl Glycol (SYSTANE) 0.4-0.3 % SOLN Place 1 drop into both eyes 3 (three) times daily.   Yes [provider]  senna (SENOKOT) 8.6 MG tablet Take 2 tablets by mouth daily.    Yes [provider]  sodium chloride (MURO 128) 2 % ophthalmic solution Place 1 drop into the left eye 3 (three) times daily.    Yes [provider]  sodium chloride (MURO 128) 5 % ophthalmic ointment Place 1 application into the left eye at bedtime.    Yes [provider]  sodium fluoride (PREVIDENT 5000 PLUS) 1.1 % CREA dental cream Place 1 application onto teeth every evening.   Yes [provider]  sulfamethoxazole-trimethoprim (BACTRIM DS,SEPTRA DS) 800-160 MG tablet Take 1 tablet by mouth 2 (two) times daily.   Yes [provider]  Tamsulosin HCl (FLOMAX) 0.4 MG CAPS Take 0.4 mg by mouth every morning.    Yes [provider]  venlafaxine XR (EFFEXOR-XR) 75 MG 24 hr capsule Take 75 mg by mouth daily with breakfast.   Yes [provider]   Dg Chest 1 View  Result Date: 07/24/2016 CLINICAL DATA:  Left hip fracture.  History of hypertension. EXAM: CHEST 1 VIEW COMPARISON:  10/24/2011 FINDINGS: Shallow inspiration with linear atelectasis in the lung bases. Heart size and pulmonary vascularity are normal. No consolidation. No blunting of costophrenic angles. No pneumothorax. Calcified and tortuous aorta. Old bilateral rib fractures. IMPRESSION: Shallow inspiration with atelectasis in the lung bases. Electronically Signed   By: Lucienne Capers M.D.   On: 07/24/2016 04:55   Dg Knee Complete 4  Views Left  Result Date: 07/24/2016 CLINICAL DATA:  Knee swelling, pain. EXAM: LEFT KNEE - COMPLETE 4+ VIEW COMPARISON:  None. FINDINGS: No acute fracture deformity or dislocation. Severe osteopenia without destructive bony lesions. No advanced degenerative change for age. Severe vascular calcifications. Soft tissue planes are otherwise unremarkable. IMPRESSION: No acute osseous process. Osteopenia which decreases sensitivity for acute nondisplaced fractures. Electronically Signed   By: Elon Alas M.D.   On: 07/24/2016 04:27   Dg Hip Unilat With Pelvis 2-3 Views Left  Result Date: 07/24/2016 CLINICAL DATA:  Unwitnessed fall.  Left hip pain. EXAM: DG HIP (WITH OR WITHOUT PELVIS) 2-3V  LEFT COMPARISON:  None. FINDINGS: There is an acute comminuted fracture of the inter trochanteric left hip with varus angulation. Displaced greater trochanteric fragment. No dislocation of the hip. Pelvis and right hip appear intact. Degenerative changes in the lower lumbar spine. Vascular calcifications. IMPRESSION: Acute comminuted inter trochanteric fracture of the left hip with varus angulation. Electronically Signed   By: Lucienne Capers M.D.   On: 07/24/2016 04:54    Positive ROS: All other systems have been reviewed and were otherwise negative with the exception of those mentioned in the HPI and as above.  Physical Exam: General:  no acute distress Cardiovascular: No pedal edema Respiratory: No cyanosis, no use of accessory musculature GI: No organomegaly, abdomen is soft and non-tender Skin: multiple small skin abrasions noted Psychiatric: Patient is not competent for consent Lymphatic: No axillary or cervical lymphadenopathy  MUSCULOSKELETAL:  LLE- held in ER and short, spontaneously moves the toes and ankle.  Skin abrasion noted on distal tibia anteriorly, 2+ DP pulse.  Assessment: Left hip IT h;ip fracture  Plan: -OR today for IMN -I have discussed this case with his nephew, his POA.  The  risks, benefits, and alternatives were discussed with the patient. There are risks associated with the surgery including, but not limited to, problems with anesthesia (death), infection, differences in leg length/angulation/rotation, fracture of bones, loosening or failure of implants, malunion, nonunion, hematoma (blood accumulation) which may require surgical drainage, blood clots, pulmonary embolism, nerve injury (foot drop), and blood vessel injury. The patient's POA understands these risks and elects to proceed. -admit to medicine post op     Nicholes Stairs, MD Cell [phone]    07/24/2016 9:35 AM

## 2016-07-24 NOTE — ED Notes (Signed)
Bed: DU20 Expected date:  Expected time:  Means of arrival:  Comments: Fall leg pain

## 2016-07-24 NOTE — Brief Op Note (Signed)
07/24/2016  12:33 PM  PATIENT:  Vincent Potts  83 y.o. male  PRE-OPERATIVE DIAGNOSIS:  intertroch fx  POST-OPERATIVE DIAGNOSIS:  intertroch fx  PROCEDURE:  Procedure(s): INTRAMEDULLARY (IM) NAIL INTERTROCHANTRIC (Left)  SURGEON:  Surgeon(s) and Role:    * Rogers, Elly Modena, MD - Primary  PHYSICIAN ASSISTANT:   ASSISTANTS: none   ANESTHESIA:   general  EBL:  Total I/O In: -  Out: 300 [Urine:200; Blood:100]  BLOOD ADMINISTERED:none  DRAINS: none   LOCAL MEDICATIONS USED:  NONE  SPECIMEN:  No Specimen  DISPOSITION OF SPECIMEN:  N/A  COUNTS:  YES  TOURNIQUET:  * No tourniquets in log *  DICTATION: .Note written in EPIC  PLAN OF CARE: Admit to inpatient   PATIENT DISPOSITION:  PACU - hemodynamically stable.   Delay start of Pharmacological VTE agent (>24hrs) due to surgical blood loss or risk of bleeding: not applicable

## 2016-07-24 NOTE — Progress Notes (Signed)
Received signout from Dr. Tomi Bamberger  Mr. Bechard is a 83 year old male with pmh HTN, CKD, hypothyroidism, dementia, CVA, and anemia; who presents after having a fall at nursing home facility. X-rays revealed a left intertrochanteric hip fracture. Creatinine 4.89 with BUN 72 baseline creatinine previously 3-4. Patient's POA currently headed to hospital and is okay with surgery. Orthopedics consulted and requesting patient to be NPO for likely procedure later today.

## 2016-07-24 NOTE — ED Triage Notes (Addendum)
Per EMS pt coming from [facility] had fall, unable to verify if fall was witnessed. No complaints of pain except for left knee. Pt has dementia and Parkinsons. Pt is reported to be at baseline per facility sending pt. Pt not on blood thinners. Swelling noted around the left knee.   126/82 97% on 2L

## 2016-07-24 NOTE — Transfer of Care (Signed)
Immediate Anesthesia Transfer of Care Note  Patient: Daune Perch  Procedure(s) Performed: Procedure(s): INTRAMEDULLARY (IM) NAIL INTERTROCHANTRIC (Left)  Patient Location: PACU  Anesthesia Type:General  Level of Consciousness: awake  Airway & Oxygen Therapy: Patient Spontanous Breathing and Patient connected to nasal cannula oxygen  Post-op Assessment: Report given to RN, Post -op Vital signs reviewed and stable and Patient moving all extremities X 4  Post vital signs: Reviewed and stable  Last Vitals:  Vitals:   07/24/16 0830 07/24/16 1249  BP: (!) 181/82 (!) 154/76  Pulse: 69 61  Resp: 12 13  Temp:  36.5 C    Last Pain:  Vitals:   07/24/16 1249  TempSrc:   PainSc: (P) Asleep         Complications: No apparent anesthesia complications

## 2016-07-24 NOTE — ED Notes (Signed)
Patient Vincent Potts.  Not able to assess pain at this time.  Patient falling in and out of sleep however.

## 2016-07-24 NOTE — ED Notes (Signed)
Spoke to pts nephew/POA Harriet Masson regarding pt condition. Informed him that we would stay in contact with him regarding plan for pt care.

## 2016-07-24 NOTE — Progress Notes (Signed)
SLP Cancellation Note  Patient Details Name: TRITON HEIDRICH MRN: [id number] DOB: 12-Mar-1924   Cancelled treatment:       Reason Eval/Treat Not Completed: Fatigue/lethargy limiting ability to participate. Pt coming off sedation for l hip fracture procedure. Spoke with RN, will defer swallow eval, follow up next date.  Deneise Lever, [place], [place] Speech-Language Pathologist [place] 07/24/2016, 4:20 PM

## 2016-07-24 NOTE — H&P (Signed)
HISTORY AND PHYSICAL       PATIENT DETAILS Name: Vincent Potts Age: 83 y.o. Sex: male Date of Birth: 1925-02-21 Admit Date: 07/24/2016 [id number], Viviann Spare, MD   Patient coming from: SNF   CHIEF COMPLAINT:  Fall with left hip fracture  HPI: Vincent Potts is a 83 y.o. male with medical history significant of dementia, hypertension, BPH, gait disorder, chronic kidney disease stage IV who was brought to the ED from his skilled nursing facility after he sustained a mechanical fall and left hip and knee pain. Patient is a very poor historian-he has advanced dementia and also has significant hearing issues-most of this history is obtained after reviewing ED chart. Apparently patient fell sometime yesterday-it was a unwitnessed fall-subsequently patient started complaining of pain in his knee and his left hip. He was brought to the emergency room, where a x-ray showed a left hip fracture. Orthopedics (Dr. Doran Durand) was consulted by the emergency department physician, recommendations were to transfer to [facility] for hip repair later today by Dr. [name].  Unable to obtain any further history at this time  Note: Lives at: SNF Mobility: Cane/Walker Chronic Indwelling Foley:no   REVIEW OF SYSTEMS: Unable to obtain given dementia and difficulty hearing-but from chart review negative for the following  Constitutional:   No  Fevers  HEENT:    No headaches, Dysphagia  Cardio-vascular: No chest pain,lower extremity edema  GI:  No nausea, vomiting, diarrhea  Resp: No shortness of breath, cough, hemoptysis  Skin:  No rash or lesions.  GU:  No dysuria, change in color of urine(foley in place).  No flank pain.  Musculoskeletal: No swelling.   Psych: No change in mood or affect.   Ophthalmology No loss of vision   ALLERGIES:   Allergies  Allergen Reactions  . Olanzapine Other (See Comments)    unknown  . Rivastigmine Nausea Only    PAST MEDICAL  HISTORY: Past Medical History:  Diagnosis Date  . Anemia   . Anxiety   . Back pain   . Cataract   . Chronic kidney disease 2013   rhabdo  . CKD (chronic kidney disease) stage 3, GFR 30-59 ml/min 08/29/2012   Creatinine 1.88 07/05/13(baseline 1.5-2.0) 02/11/14 creatinine 2.00 05/16/14 creatinine 2.31 07/18/14 creatinine 2.22     . CVA (cerebral infarction)   . Cystic disease of liver   . Dementia with behavioral disturbance 06/06/2012  . Depression 06/06/2012  . Diverticulosis   . Edema   . Gait disorder   . GERD (gastroesophageal reflux disease)   . H/O hiatal hernia 2008   repaired  . Hearing loss   . Hearing loss   . Hypertension   . Hyponatremia   . Inguinal hernia   . Keratosis   . Paresthesia   . Parkinsonism ([place])   . PVC (premature ventricular contraction)   . Sciatica   . Sciatica   . Stroke [facility]) 2010   no deficits  . Thyroid disease   . Vitamin D deficiency disease     PAST SURGICAL HISTORY: Past Surgical History:  Procedure Laterality Date  . APPENDECTOMY    . ESOPHAGOGASTRODUODENOSCOPY  10/25/2011   Procedure: ESOPHAGOGASTRODUODENOSCOPY (EGD);  Surgeon: Winfield Cunas., MD;  Location: [facility] ENDOSCOPY;  Service: Endoscopy;  Laterality: N/A;  . HERNIA REPAIR    . PROSTATE SURGERY     cancer    MEDICATIONS AT HOME: Prior to Admission medications   Medication Sig Start  Date End Date Taking? Authorizing Provider  acetaminophen (TYLENOL) 325 MG tablet Take 650 mg by mouth every 4 (four) hours as needed for mild pain or moderate pain.    Yes [provider]  acetaminophen (TYLENOL) 500 MG tablet Take 500 mg by mouth at bedtime.    Yes [provider]  amLODipine (NORVASC) 5 MG tablet Take 5 mg by mouth daily.    Yes [provider]  bisacodyl (DULCOLAX) 10 MG suppository Place 10 mg rectally daily as needed for moderate constipation.   Yes [provider]  Cholecalciferol (VITAMIN D-3) 1000 units CAPS Take 1,000 Units by mouth  daily.    Yes [provider]  finasteride (PROSCAR) 5 MG tablet Take 5 mg by mouth daily.   Yes [provider]  fluticasone (FLONASE) 50 MCG/ACT nasal spray Place 1 spray into both nostrils daily.   Yes [provider]  loteprednol (LOTEMAX) 0.5 % ophthalmic suspension Place 1 drop into the left eye daily.   Yes [provider]  magnesium hydroxide (MILK OF MAGNESIA) 400 MG/5ML suspension Take 30 mLs by mouth daily as needed for mild constipation.  10/28/11  Yes Velvet Bathe, MD  Melatonin 3 MG TABS Take 3 mg by mouth at bedtime.    Yes [provider]  memantine (NAMENDA XR) 28 MG CP24 24 hr capsule Take 28 mg by mouth daily. Take one daily for memory   Yes [provider]  mirtazapine (REMERON) 30 MG tablet Take 30 mg by mouth at bedtime.   Yes [provider]  Nutritional Supplements (RESOURCE BREEZE PO) Take 120 mLs by mouth 4 (four) times daily. nutritional supplement   Yes [provider]  Omega-3 Fatty Acids (FISH OIL) 1000 MG CAPS Take 2,000 mg by mouth daily.    Yes [provider]  Polyethyl Glycol-Propyl Glycol (SYSTANE) 0.4-0.3 % SOLN Place 1 drop into both eyes 3 (three) times daily.   Yes [provider]  senna (SENOKOT) 8.6 MG tablet Take 2 tablets by mouth daily.    Yes [provider]  sodium chloride (MURO 128) 2 % ophthalmic solution Place 1 drop into the left eye 3 (three) times daily.    Yes [provider]  sodium chloride (MURO 128) 5 % ophthalmic ointment Place 1 application into the left eye at bedtime.    Yes [provider]  sodium fluoride (PREVIDENT 5000 PLUS) 1.1 % CREA dental cream Place 1 application onto teeth every evening.   Yes [provider]  sulfamethoxazole-trimethoprim (BACTRIM DS,SEPTRA DS) 800-160 MG tablet Take 1 tablet by mouth 2 (two) times daily.   Yes [provider]  Tamsulosin HCl (FLOMAX) 0.4 MG CAPS Take 0.4 mg by  mouth every morning.    Yes [provider]  venlafaxine XR (EFFEXOR-XR) 75 MG 24 hr capsule Take 75 mg by mouth daily with breakfast.   Yes [provider]    FAMILY HISTORY: Family History  Problem Relation Age of Onset  . Hypertension Mother     SOCIAL HISTORY:  reports that he has never smoked. He has never used smokeless tobacco. He reports that he does not drink alcohol or use drugs.  PHYSICAL EXAM: Blood pressure (!) 176/88, pulse (!) 43, temperature 97.2 F (36.2 C), temperature source Oral, resp. rate 14, height 5\' 5"  (1.651 m), weight 59 kg (130 lb), SpO2 98 %.  General appearance :Awake, Somewhat alert-occasionally follows commands. Very hard of hearing in spite of having a hearing  aid. Not in any distress. Eyes:, pupils equally reactive to light and accomodation,no scleral icterus HEENT: Atraumatic and Normocephalic Neck: supple Resp:Good air entry bilaterally, no added sounds  CVS: S1 S2 regular, no murmurs.  GI: Bowel sounds present, Non tender and not distended with no gaurding, rigidity or rebound.No organomegaly Extremities: B/L Lower Ext shows no edema, both legs are warm to touch Neurology: Difficult exam-but moves all 4 extremities Psychiatric: Has dementia-unable to assess Musculoskeletal:No digital cyanosis Skin:No Rash, warm and dry Wounds:N/A  LABS ON ADMISSION:  I have personally reviewed following labs and imaging studies  CBC:  Recent Labs Lab 07/22/16 07/24/16 0526  WBC 7.3 10.2  NEUTROABS  --  8.8*  HGB 8.8* 10.4*  HCT 27* 32.1*  MCV  --  91.5  PLT 219 025    Basic Metabolic Panel:  Recent Labs Lab 07/22/16 07/24/16 0526  NA 138 138  K 4.4 4.7  CL  --  101  CO2  --  27  GLUCOSE  --  93  BUN 58* 72*  CREATININE 4.0* 4.89*  CALCIUM  --  8.7*    GFR: Estimated Creatinine Clearance: 8.4 mL/min (A) (by C-G formula based on SCr of 4.89 mg/dL (H)).  Liver Function Tests:  Recent Labs Lab 07/22/16  07/24/16 0526  AST 11* 20  ALT 9* 21  ALKPHOS 84 95  BILITOT  --  0.5  PROT  --  8.0  ALBUMIN  --  4.1   No results for input(s): LIPASE, AMYLASE in the last 168 hours. No results for input(s): AMMONIA in the last 168 hours.  Coagulation Profile:  Recent Labs Lab 07/24/16 0526  INR 1.13    Cardiac Enzymes: No results for input(s): CKTOTAL, CKMB, CKMBINDEX, TROPONINI in the last 168 hours.  BNP (last 3 results) No results for input(s): PROBNP in the last 8760 hours.  HbA1C: No results for input(s): HGBA1C in the last 72 hours.  CBG: No results for input(s): GLUCAP in the last 168 hours.  Lipid Profile: No results for input(s): CHOL, HDL, LDLCALC, TRIG, CHOLHDL, LDLDIRECT in the last 72 hours.  Thyroid Function Tests:  Recent Labs  07/22/16  TSH 0.70    Anemia Panel: No results for input(s): VITAMINB12, FOLATE, FERRITIN, TIBC, IRON, RETICCTPCT in the last 72 hours.  Urine analysis:    Component Value Date/Time   COLORURINE YELLOW 07/24/2016 0541   APPEARANCEUR CLEAR 07/24/2016 0541   LABSPEC 1.013 07/24/2016 0541   PHURINE 7.0 07/24/2016 0541   GLUCOSEU NEGATIVE 07/24/2016 0541   HGBUR NEGATIVE 07/24/2016 0541   BILIRUBINUR NEGATIVE 07/24/2016 0541   KETONESUR NEGATIVE 07/24/2016 0541   PROTEINUR 100 (A) 07/24/2016 0541   UROBILINOGEN 0.2 11/04/2012 1905   NITRITE NEGATIVE 07/24/2016 0541   LEUKOCYTESUR NEGATIVE 07/24/2016 0541    Sepsis Labs: Lactic Acid, Venous    Component Value Date/Time   LATICACIDVEN 0.7 04/18/2011 2131     Microbiology: No results found for this or any previous visit (from the past 240 hour(s)).    RADIOLOGIC STUDIES ON ADMISSION: Dg Chest 1 View  Result Date: 07/24/2016 CLINICAL DATA:  Left hip fracture.  History of hypertension. EXAM: CHEST 1 VIEW COMPARISON:  10/24/2011 FINDINGS: Shallow inspiration with linear atelectasis in the lung bases. Heart size and pulmonary vascularity are normal. No consolidation. No  blunting of costophrenic angles. No pneumothorax. Calcified and tortuous aorta. Old bilateral rib fractures. IMPRESSION: Shallow inspiration with atelectasis in the lung bases. Electronically Signed   By: Oren Beckmann.D.  On: 07/24/2016 04:55   Dg Knee Complete 4 Views Left  Result Date: 07/24/2016 CLINICAL DATA:  Knee swelling, pain. EXAM: LEFT KNEE - COMPLETE 4+ VIEW COMPARISON:  None. FINDINGS: No acute fracture deformity or dislocation. Severe osteopenia without destructive bony lesions. No advanced degenerative change for age. Severe vascular calcifications. Soft tissue planes are otherwise unremarkable. IMPRESSION: No acute osseous process. Osteopenia which decreases sensitivity for acute nondisplaced fractures. Electronically Signed   By: Elon Alas M.D.   On: 07/24/2016 04:27   Dg Hip Unilat With Pelvis 2-3 Views Left  Result Date: 07/24/2016 CLINICAL DATA:  Unwitnessed fall.  Left hip pain. EXAM: DG HIP (WITH OR WITHOUT PELVIS) 2-3V LEFT COMPARISON:  None. FINDINGS: There is an acute comminuted fracture of the inter trochanteric left hip with varus angulation. Displaced greater trochanteric fragment. No dislocation of the hip. Pelvis and right hip appear intact. Degenerative changes in the lower lumbar spine. Vascular calcifications. IMPRESSION: Acute comminuted inter trochanteric fracture of the left hip with varus angulation. Electronically Signed   By: Lucienne Capers M.D.   On: 07/24/2016 04:54    I have personally reviewed images of chest xray-no pneumonia  EKG:  Personally reviewed. NSR  ASSESSMENT AND PLAN: Left hip fracture: Elderly 83 year old male with advanced dementia, chronic kidney disease stage IV and adult failure to thrive syndrome-at baseline, patient lives in a skilled nursing facility and only able to ambulate with the help of a walkerhe will be at high risk for any proposed surgical procedure. However, I think he is as optimized as one could be with this  many comorbid issues and advanced age. Spoke with patient's nephew over the phone (he is driving to [place]), he is fully aware of significant perioperative complications including infections, arrhythmias, MI, VTE, and even death. He is accepting all risks, and wishes that patient undergo surgery so that we could minimize his pain, regain ambulation etc.  Acute on chronic kidney disease stage IV: Has a baseline creatinine between 3-4, minimally elevated creatinine than his usual baseline-cautiously hydrate-hold/avoid nephrotoxic agents. Follow electrolytes closely in the postoperative setting-his volume status currently appears stable-I see no indication for diuretics at this point. Will need careful IV fluid administration to avoid volume overload. He is apparently followed by [place] [facility], and if his renal function deteriorates postoperatively may need a renal input-but given his overall state of health-I do not think he would be a good long-term dialysis candidate.   Hypertension: Continue amlodipine. Follow BP trend and adjust accordingly  Anemia: Likely secondary to chronic kidney disease-follow CBC  BPH: Continue Flomax and finasteride. Currently has a Foley catheter in place  Dementia/depression: Very hard of hearing-at times not able to answer simple questions. Continue Namenda, Remeron  Adult failure to thrive syndrome: Will need PT, nutritional eval post surgery.   Goals of care: DNR in place (confirmed with patient's nephew over the phone). Family aware that patient is at high risk for the proposed surgical procedure and is at risk of significant perioperative complication that could lead to significant disability and death. If he does indeed not do well postoperatively-suspect will need early involvement by the palliative care team.   Further plan will depend as patient's clinical course evolves and further radiologic and laboratory data become available. Patient will be monitored  closely.  Above noted plan was discussed with patient's nephew over the phone, he was in agreement  CONSULTS: Orthopedics  DVT Prophylaxis: SCD's-deferred to orthopedics in the postoperative setting  Code Status: DNR  Disposition Plan:  Discharge back to SNF sometime next week-depending on how he does   Admission status: Inpatient  going to tele  The medical decision making on this patient was of high complexity and the patient is at high risk for clinical deterioration, therefore this is a level 3 visit.  Total time spent  65 minutes.Greater than 50% of this time was spent in counseling, explanation of diagnosis, planning of further management, and coordination of care.  Oren Binet [facility] Pager [phone]  If 7PM-7AM, please contact night-coverage [email or web address] Password [email or web address] 07/24/2016, 7:52 AM

## 2016-07-24 NOTE — Progress Notes (Signed)
Telephone consent received with 2 RNS

## 2016-07-25 LAB — BASIC METABOLIC PANEL
Anion gap: 9 (ref 5–15)
BUN: 60 mg/dL — AB (ref 4–21)
BUN: 60 mg/dL — AB (ref 6–20)
CHLORIDE: 113 mmol/L — AB (ref 101–111)
CO2: 21 mmol/L — ABNORMAL LOW (ref 22–32)
CREATININE: 4.3 mg/dL — AB (ref 0.6–1.3)
Calcium: 7.5 mg/dL — ABNORMAL LOW (ref 8.9–10.3)
Creatinine, Ser: 4.31 mg/dL — ABNORMAL HIGH (ref 0.61–1.24)
GFR calc non Af Amer: 11 mL/min — ABNORMAL LOW (ref >60)
GFR, EST AFRICAN AMERICAN: 13 mL/min — AB (ref >60)
GLUCOSE: 83 mg/dL
Glucose, Bld: 83 mg/dL (ref 65–99)
POTASSIUM: 4.7 mmol/L (ref 3.4–5.3)
POTASSIUM: 4.7 mmol/L (ref 3.5–5.1)
SODIUM: 143 mmol/L (ref 137–147)
SODIUM: 143 mmol/L (ref 135–145)

## 2016-07-25 LAB — CBC
HEMATOCRIT: 23.9 % — AB (ref 39.0–52.0)
Hemoglobin: 7.5 g/dL — ABNORMAL LOW (ref 13.0–17.0)
MCH: 29.1 pg (ref 26.0–34.0)
MCHC: 31.4 g/dL (ref 30.0–36.0)
MCV: 92.6 fL (ref 78.0–100.0)
Platelets: 159 10*3/uL (ref 150–400)
RBC: 2.58 MIL/uL — AB (ref 4.22–5.81)
RDW: 16 % — ABNORMAL HIGH (ref 11.5–15.5)
WBC: 9.6 10*3/uL (ref 4.0–10.5)

## 2016-07-25 LAB — CBC AND DIFFERENTIAL
HCT: 24 % — AB (ref 41–53)
Hemoglobin: 7.5 g/dL — AB (ref 13.5–17.5)
Platelets: 159 10*3/uL (ref 150–399)
WBC: 9.6 10*3/mL

## 2016-07-25 LAB — PREPARE RBC (CROSSMATCH)

## 2016-07-25 MED ORDER — SODIUM CHLORIDE 0.9 % IV SOLN
Freq: Once | INTRAVENOUS | Status: AC
  Administered 2016-07-25: 19:00:00 via INTRAVENOUS

## 2016-07-25 MED ORDER — ENSURE ENLIVE PO LIQD
237.0000 mL | Freq: Two times a day (BID) | ORAL | Status: DC
  Administered 2016-07-25 – 2016-07-29 (×8): 237 mL via ORAL

## 2016-07-25 NOTE — Evaluation (Addendum)
Clinical/Bedside Swallow Evaluation Patient Details  Name: Vincent Potts MRN: [id number] Date of Birth: [birth year]  Today's Date: 07/25/2016 Time: SLP Start Time (ACUTE ONLY): 43 SLP Stop Time (ACUTE ONLY): 1145 SLP Time Calculation (min) (ACUTE ONLY): 15 min  Past Medical History:  Past Medical History:  Diagnosis Date  . Anemia   . Anxiety   . Back pain   . Cataract   . Chronic kidney disease 2013   rhabdo  . CKD (chronic kidney disease) stage 3, GFR 30-59 ml/min 08/29/2012   Creatinine 1.88 07/05/13(baseline 1.5-2.0) 02/11/14 creatinine 2.00 05/16/14 creatinine 2.31 07/18/14 creatinine 2.22     . CVA (cerebral infarction)   . Cystic disease of liver   . Dementia with behavioral disturbance 06/06/2012  . Depression 06/06/2012  . Diverticulosis   . Edema   . Gait disorder   . GERD (gastroesophageal reflux disease)   . H/O hiatal hernia 2008   repaired  . Hearing loss   . Hearing loss   . Hypertension   . Hyponatremia   . Inguinal hernia   . Keratosis   . Paresthesia   . Parkinsonism ([place])   . PVC (premature ventricular contraction)   . Sciatica   . Sciatica   . Stroke [facility]) 2010   no deficits  . Thyroid disease   . Vitamin D deficiency disease    Past Surgical History:  Past Surgical History:  Procedure Laterality Date  . APPENDECTOMY    . ESOPHAGOGASTRODUODENOSCOPY  10/25/2011   Procedure: ESOPHAGOGASTRODUODENOSCOPY (EGD);  Surgeon: Winfield Cunas., MD;  Location: [facility] ENDOSCOPY;  Service: Endoscopy;  Laterality: N/A;  . HERNIA REPAIR    . PROSTATE SURGERY     cancer   HPI:  Pt is a 83 y.o. male with PMH of dementia, hypertension, BPH, gait disorder, chronic kidney disease stage IV who was brought to the ED from his skilled nursing facility after he sustained a mechanical fall and left hip and knee pain. Patient is a very poor historian-he has advanced dementia and also has significant hearing issues-most of this history is obtained after reviewing ED chart.  Apparently patient fell sometime 5/25-it was a unwitnessed fall-subsequently patient started complaining of pain in his knee and his left hip. He was brought to the emergency room, where a x-ray showed a left hip fracture. Orthopedics (Dr. Doran Durand) was consulted by the emergency department physician, recommendations were to transfer to [facility] for hip repair later today by Dr. [name]. Bedside swallow eval ordered.   Assessment / Plan / Recommendation Clinical Impression  Pt coughed x1 following trial of thin liquid; no other s/s of aspiration observed with pt consuming ~6 oz of thin liquid total. Pt needed max cues/ encouragement for trial of regular solid; consumed 1 bite then politely declined further trials. Pt appeared to have slowed mastication of regular solid but no oral residuals or pocketing noted. Throughout eval pt benefited from visual/ tactile cues to follow simple commands. Pt is at increased risk of aspiration given advanced dementia. Recommend advancing diet to dysphagia 3/ thin liquids, meds either whole with liquid or crushed in puree if difficulties arise. Full supervision to assist with feeding. Will f/u for diet tolerance/ consider advancement.  SLP Visit Diagnosis: Dysphagia, unspecified (R13.10)    Aspiration Risk  Mild aspiration risk    Diet Recommendation Dysphagia 3 (Mech soft);Thin liquid   Liquid Administration via: Cup;Straw Medication Administration: Whole meds with liquid (crushed in puree if difficulties arise) Supervision: Staff to assist  with self feeding;Full supervision/cueing for compensatory strategies Compensations: Slow rate;Small sips/bites Postural Changes: Seated upright at 90 degrees    Other  Recommendations Oral Care Recommendations: Oral care BID   Follow up Recommendations 24 hour supervision/assistance      Frequency and Duration min 1 x/week  1 week       Prognosis Prognosis for Safe Diet Advancement: Good Barriers to Reach  Goals: Cognitive deficits      Swallow Study   General HPI: Pt is a 83 y.o. male with PMH of dementia, hypertension, BPH, gait disorder, chronic kidney disease stage IV who was brought to the ED from his skilled nursing facility after he sustained a mechanical fall and left hip and knee pain. Patient is a very poor historian-he has advanced dementia and also has significant hearing issues-most of this history is obtained after reviewing ED chart. Apparently patient fell sometime 5/25-it was a unwitnessed fall-subsequently patient started complaining of pain in his knee and his left hip. He was brought to the emergency room, where a x-ray showed a left hip fracture. Orthopedics (Dr. Doran Durand) was consulted by the emergency department physician, recommendations were to transfer to [facility] for hip repair later today by Dr. [name]. Bedside swallow eval ordered. Type of Study: Bedside Swallow Evaluation Previous Swallow Assessment: none in chart Diet Prior to this Study: Dysphagia 2 (chopped);Thin liquids Temperature Spikes Noted: No Respiratory Status: Room air History of Recent Intubation: No Behavior/Cognition: Alert;Cooperative;Requires cueing Oral Cavity Assessment: Dry Oral Cavity - Dentition: Adequate natural dentition Self-Feeding Abilities: Needs assist Patient Positioning: Upright in bed Baseline Vocal Quality: Normal Volitional Cough: Strong Volitional Swallow: Unable to elicit    Oral/Motor/Sensory Function Overall Oral Motor/Sensory Function: Within functional limits   Ice Chips Ice chips: Not tested   Thin Liquid Thin Liquid: Within functional limits    Nectar Thick Nectar Thick Liquid: Not tested   Honey Thick Honey Thick Liquid: Not tested   Puree Puree: Within functional limits   Solid   GO   Solid: Impaired Oral Phase Impairments: Impaired mastication        Vincent Potts Vincent Kesha Hurrell, MA, CCC-SLP 07/25/2016,11:52 AM

## 2016-07-25 NOTE — Evaluation (Signed)
Occupational Therapy Evaluation Patient Details Name: Vincent Potts MRN: [id number] DOB: 10-01-1924 Today's Date: 07/25/2016    History of Present Illness 83 y.o.male admitted to ED from SNF for mechanical fall. Apparently patient fell sometime 5/25-it was a unwitnessed fall-subsequently patient started complaining of pain in his knee and his left hip. Underwent surgery on 5/26 for intramedullary nail intertrachantric (left) procedure. Ptwith medical history significant of dementia, hypertension, BPH, gait disorder, and chronic kidney disease stage IV.   Clinical Impression   Per chart, pt admitted from SNF. PLOF difficulty to collect due to pt's decreased cognition. Currently, pt performs ADLs and functional transfers with Max A. Pt would benefit from skilled OT to increase occupational performance and participation. Recommend dc to SNF for OT to optimize function and safety.     Follow Up Recommendations  SNF;Supervision/Assistance - 24 hour    Equipment Recommendations  Other (comment) (Defer to next venue)    Recommendations for Other Services PT consult     Precautions / Restrictions Precautions Precautions: Fall Restrictions Weight Bearing Restrictions: Yes LLE Weight Bearing: Partial weight bearing LLE Partial Weight Bearing Percentage or Pounds: 50%      Mobility Bed Mobility Overal bed mobility: Needs Assistance Bed Mobility: Supine to Sit     Supine to sit: Mod assist;Max assist;+2 for physical assistance     General bed mobility comments: Pt required A for managing BLE and transitioning hips with Pad. Max A for elevating trunk due to pt's pain level  Transfers Overall transfer level: Needs assistance   Transfers: Sit to/from [employer] Transfers Sit to Stand: Max assist;+2 safety/equipment   Squat pivot transfers: Max assist;+2 safety/equipment          Balance Overall balance assessment: Needs assistance Sitting-balance support: Feet  supported;No upper extremity supported Sitting balance-Leahy Scale: Fair Sitting balance - Comments: Pt able to perform grooming at EOB without UE support. however, feels more safe with UE support Postural control: Posterior lean;Other (comment) (initial post lean. Able to currect with physical A) Standing balance support: During functional activity;No upper extremity supported Standing balance-Leahy Scale: Poor                             ADL either performed or assessed with clinical judgement   ADL Overall ADL's : Needs assistance/impaired                                       General ADL Comments: Pt Max A for ADLs and functional mobility     Vision         Perception     Praxis      Pertinent Vitals/Pain Pain Assessment: Faces Faces Pain Scale: Hurts even more Pain Location: back pain and LLE with movement Pain Descriptors / Indicators: Constant;Grimacing;Discomfort;Moaning Pain Intervention(s): Repositioned;Monitored during session     Hand Dominance Right   Extremity/Trunk Assessment Upper Extremity Assessment Upper Extremity Assessment: Generalized weakness   Lower Extremity Assessment Lower Extremity Assessment: Defer to PT evaluation   Cervical / Trunk Assessment Cervical / Trunk Assessment: Kyphotic   Communication Communication Communication: HOH (Hearing aide in L ear)   Cognition Arousal/Alertness: Awake/alert Behavior During Therapy: Anxious (Perseverating on back pain) Overall Cognitive Status: History of cognitive impairments - at baseline  General Comments       Exercises     Shoulder Instructions      Home Living Family/patient expects to be discharged to:: Skilled nursing facility                                 Additional Comments: Per chart, pt used RW PTA      Prior Functioning/Environment Level of Independence: Needs assistance         Comments: Pt poor historian and unable to provide PLOF        OT Problem List: Decreased strength;Decreased range of motion;Decreased activity tolerance;Impaired balance (sitting and/or standing);Impaired vision/perception;Decreased cognition;Decreased safety awareness;Decreased knowledge of use of DME or AE;Decreased knowledge of precautions;Pain      OT Treatment/Interventions: Self-care/ADL training;Therapeutic exercise;Energy conservation;DME and/or AE instruction;Therapeutic activities;Patient/family education    OT Goals(Current goals can be found in the care plan section) Acute Rehab OT Goals Patient Stated Goal: Unstated OT Goal Formulation: Patient unable to participate in goal setting ADL Goals Pt Will Perform Eating: with min assist;sitting (with Mod verbal and visual cues) Pt Will Perform Grooming: with min assist;sitting (with Mod verbal and visual cues) Pt Will Transfer to Toilet: with mod assist;stand pivot transfer;bedside commode  OT Frequency: Min 2X/week   Barriers to D/C:            Co-evaluation PT/OT/SLP Co-Evaluation/Treatment: Yes Reason for Co-Treatment: Complexity of the patient's impairments (multi-system involvement);For patient/therapist safety;To address functional/ADL transfers PT goals addressed during session: Mobility/safety with mobility OT goals addressed during session: ADL's and self-care      AM-PAC PT "6 Clicks" Daily Activity     Outcome Measure Help from another person eating meals?: A Lot Help from another person taking care of personal grooming?: A Lot Help from another person toileting, which includes using toliet, bedpan, or urinal?: A Lot Help from another person bathing (including washing, rinsing, drying)?: A Lot Help from another person to put on and taking off regular upper body clothing?: A Lot Help from another person to put on and taking off regular lower body clothing?: Total 6 Click Score: 11   End of Session  Equipment Utilized During Treatment: Gait belt;Oxygen Nurse Communication: Mobility status;Other (comment);Weight bearing status (Pt's complaint of back pain)  Activity Tolerance: Patient limited by pain;Other (comment) (Limited by cogntition) Patient left: in chair;with call bell/phone within reach;with chair alarm set  OT Visit Diagnosis: Unsteadiness on feet (R26.81);Muscle weakness (generalized) (M62.81);History of falling (Z91.81);Pain;Other symptoms and signs involving cognitive function;Feeding difficulties (R63.3) Pain - Right/Left: Left Pain - part of body: Knee                Time: 9326-7124 OT Time Calculation (min): 23 min Charges:  OT General Charges $OT Visit: 1 Procedure OT Evaluation $OT Eval Moderate Complexity: 1 Procedure G-Codes:     [place], OTR/L [phone]  [place] Footman Marise Knapper 07/25/2016, 1:15 PM

## 2016-07-25 NOTE — Progress Notes (Signed)
Dark dried blood was noticed on pts urethra before removing his catheter this morning. Pt has been trying to pull out the catheter even with securing device is placed. Foley was removed without difficulty. No active bleeding is noted nor blood in the urine is noted in the foley bag. Oncoming nurse will informed. MD will notify. Nursing will continue to monitor.

## 2016-07-25 NOTE — Evaluation (Signed)
Physical Therapy Evaluation Patient Details Name: Vincent Potts MRN: [id number] DOB: 04-11-1924 Today's Date: 07/25/2016   History of Present Illness  83 y.o.male admitted to ED from SNF for mechanical fall. Apparently patient fell sometime 5/25-it was a unwitnessed fall-subsequently patient started complaining of pain in his knee and his left hip. Underwent surgery on 5/26 for intramedullary nail intertrachantric (left) procedure. Ptwith medical history significant of dementia, hypertension, BPH, gait disorder, and chronic kidney disease stage IV.  Clinical Impression  Patient is s/p above surgery resulting in functional limitations due to the deficits listed below (see PT Problem List). Patient will benefit from skilled PT to increase their independence and safety with mobility to allow discharge to SNF for continued rehab. Pt was ambulatory prior to fracture per medical record and hopefully with rehab can become ambulatory again.      Follow Up Recommendations SNF;Supervision/Assistance - 24 hour    Equipment Recommendations  None recommended by PT    Recommendations for Other Services       Precautions / Restrictions Precautions Precautions: Fall Restrictions Weight Bearing Restrictions: Yes LLE Weight Bearing: Partial weight bearing LLE Partial Weight Bearing Percentage or Pounds: 50%      Mobility  Bed Mobility Overal bed mobility: Needs Assistance Bed Mobility: Supine to Sit     Supine to sit: Max assist;+2 for physical assistance     General bed mobility comments: Pt required A for managing BLE and transitioning hips with Pad. Max A for elevating trunk due to pt's pain level  Transfers Overall transfer level: Needs assistance Equipment used: None Transfers: Squat Pivot Transfers Sit to Stand: Max assist;+2 safety/equipment   Squat pivot transfers: Max assist;+2 safety/equipment     General transfer comment: Pt able to weight bear through RLE to assist with  transfers. Needs tactile cues to initiate and complete transfer.  Ambulation/Gait Ambulation/Gait assistance:  (Unable to ambulate)              Stairs            Wheelchair Mobility    Modified Rankin (Stroke Patients Only)       Balance Overall balance assessment: Needs assistance Sitting-balance support: Feet supported;No upper extremity supported Sitting balance-Leahy Scale: Poor Sitting balance - Comments: Pt able to perform grooming at EOB without UE support. however, feels more safe with UE support Postural control: Posterior lean;Other (comment) Standing balance support: During functional activity;No upper extremity supported Standing balance-Leahy Scale: Poor                               Pertinent Vitals/Pain Pain Assessment: Faces Faces Pain Scale: Hurts even more Pain Location: back pain and LLE with movement Pain Descriptors / Indicators: Constant;Grimacing;Discomfort;Moaning Pain Intervention(s): Limited activity within patient's tolerance;Monitored during session (pt mostly c/o back pain. Only c/o leg pain with movement)    Home Living Family/patient expects to be discharged to:: Skilled nursing facility                 Additional Comments: Per chart, pt used RW PTA    Prior Function Level of Independence: Needs assistance   Gait / Transfers Assistance Needed: independant with device per chart. Pt unable to give prior status     Comments: Pt poor historian and unable to provide PLOF     Hand Dominance   Dominant Hand: Right    Extremity/Trunk Assessment   Upper Extremity Assessment Upper Extremity Assessment: Defer  to OT evaluation    Lower Extremity Assessment Lower Extremity Assessment: LLE deficits/detail LLE Deficits / Details: Overall weakness due to fracture and recent surgery    Cervical / Trunk Assessment Cervical / Trunk Assessment: Kyphotic  Communication   Communication: HOH (Uses hearing aid in  left ear)  Cognition Arousal/Alertness: Awake/alert Behavior During Therapy: Anxious (Anxiety related to back pain) Overall Cognitive Status: History of cognitive impairments - at baseline                                        General Comments General comments (skin integrity, edema, etc.): No visitors present.  Pt with multiple dressings and bandages covering wound issues.     Exercises     Assessment/Plan    PT Assessment Patient needs continued PT services  PT Problem List Decreased strength;Decreased activity tolerance;Decreased balance;Decreased mobility;Decreased cognition;Pain       PT Treatment Interventions DME instruction;Gait training;Therapeutic activities;Therapeutic exercise;Balance training;Patient/family education    PT Goals (Current goals can be found in the Care Plan section)  Acute Rehab PT Goals Patient Stated Goal: pt unable to state PT Goal Formulation: Patient unable to participate in goal setting Time For Goal Achievement: 08/01/16 Potential to Achieve Goals: Fair    Frequency Min 3X/week   Barriers to discharge        Co-evaluation PT/OT/SLP Co-Evaluation/Treatment: Yes Reason for Co-Treatment: For patient/therapist safety;To address functional/ADL transfers PT goals addressed during session: Mobility/safety with mobility OT goals addressed during session: ADL's and self-care       AM-PAC PT "6 Clicks" Daily Activity  Outcome Measure Difficulty turning over in bed (including adjusting bedclothes, sheets and blankets)?: Total Difficulty moving from lying on back to sitting on the side of the bed? : Total Difficulty sitting down on and standing up from a chair with arms (e.g., wheelchair, bedside commode, etc,.)?: Total Help needed moving to and from a bed to chair (including a wheelchair)?: Total Help needed walking in hospital room?: Total Help needed climbing 3-5 steps with a railing? : Total 6 Click Score: 6    End of  Session Equipment Utilized During Treatment: Gait belt Activity Tolerance: Patient limited by pain Patient left: in chair;with chair alarm set;with call bell/phone within reach Nurse Communication: Mobility status;Need for lift equipment (Discussed pt's transfer with charge RN. Consider lift equip.) PT Visit Diagnosis: Difficulty in walking, not elsewhere classified (R26.2)    Time: 7867-6720 PT Time Calculation (min) (ACUTE ONLY): 21 min   Charges:   PT Evaluation $PT Eval Moderate Complexity: 1 Procedure     PT G CodesLavonia Dana, PT  [phone] 07/25/2016   Melvern Banker 07/25/2016, 2:45 PM

## 2016-07-25 NOTE — Progress Notes (Signed)
Patient refused lab draw this morning. Lab tech told RN that they'll come back and try to draw blood this morning. Will continue to monitor.

## 2016-07-25 NOTE — Progress Notes (Signed)
Vincent Potts  MRN: [id number] DOB/Age: 09/14/1924 83 y.o. Physician: [name] Procedure: Procedure(s) (LRB): INTRAMEDULLARY (IM) NAIL INTERTROCHANTRIC (Left)     Subjective: Awakens and expresses no complaints except feet being cold  Vital Signs Temp:  [97.5 F (36.4 C)-98.7 F (37.1 C)] 98.7 F (37.1 C) (05/27 4944) Pulse Rate:  [57-86] 69 (05/27 0638) Resp:  [7-19] 16 (05/26 1400) BP: (100-181)/(42-89) 142/89 (05/27 0638) SpO2:  [94 %-100 %] 97 % (05/27 0638)  Lab Results  Recent Labs  07/24/16 0526  WBC 10.2  HGB 10.4*  HCT 32.1*  PLT 230   BMET  Recent Labs  07/24/16 0526  NA 138  K 4.7  CL 101  CO2 27  GLUCOSE 93  BUN 72*  CREATININE 4.89*  CALCIUM 8.7*   INR  Date Value Ref Range Status  07/24/2016 1.13  Final     Exam Left hip dressing dry Moves toes on command.        Plan 50% PWB LLE Mobilize and continue medical care per medicine  [facility] PA-C  for Dr.Rogers 07/25/2016, 8:22 AM Contact # [phone]

## 2016-07-25 NOTE — Progress Notes (Signed)
PROGRESS NOTE                                                                                                                                                                                                             Patient Demographics:    Vincent Potts, is a 83 y.o. male, DOB - 1924/08/24, BSJ:[id number]  Admit date - 07/24/2016   Admitting Physician [name], MD  Outpatient Primary MD for the patient is [name], MD  LOS - 1  Chief Complaint  Patient presents with  . Fall  . Knee Pain       Brief Narrative   83 y.o. male with medical history significant of dementia, hypertension, BPH, gait disorder, chronic kidney disease stage IV who was brought to the ED from his skilled nursing facility after he sustained a mechanical fall and left hip Fracture, went for surgical repair 5/26 by Dr. [name].   Subjective:    [name] today With no significant events overnight, asking if he can drink some orange juice, otherwise denies any complaints, no significant events overnight   Assessment  & Plan :    Principal Problem:   Hip fracture ([place]) Active Problems:   Hypertension   Hearing loss   Depression   Dementia with behavioral disturbance   BPH (benign prostatic hyperplasia)   CKD (chronic kidney disease) stage 4, GFR 15-29 ml/min (HCC)   Anemia in chronic kidney disease   Adult failure to thrive   Left hip fracture: - Secondary to mechanical fall, once for surgical repair 5/26 by Dr. [name], continue with when necessary pain and nausea medication, DVT prophylaxis per ortho.   Acute on chronic kidney disease stage IV:  -  baseline creatinine between 3-4, was 4.8 on admission, improving, today is 4.2, avoid nephrotoxic medication , continue with gentle hydration .  Hypertension:  - Blood pressure acceptable, continue with amlodipine   Acute blood loss anemia  - With baseline anemia of chronic kidney disease, with  postoperative blood loss, will transfuse 1 unit PRBC.  BPH - Continue with Flomax and finasteride  Dementia/depression: -  Very hard of hearing-at times not able to answer simple questions. Continue Namenda, Remeron  Adult failure to thrive syndrome:  - Will need PT, nutritional eval post surgery.    Code Status : DNR  Family  Communication  : Niece at bedside  Disposition Plan  : SNF  Consults  :  Ortho  Procedures  : INTRAMEDULLARY (IM) NAIL INTERTROCHANTRIC (Left) by Dr. [name] on 5/26  DVT Prophylaxis  :   SCDs ,aspirin  Lab Results  Component Value Date   PLT 159 07/25/2016    Antibiotics  :    Anti-infectives    Start     Dose/Rate Route Frequency Ordered Stop   07/24/16 1800  ceFAZolin (ANCEF) IVPB 1 g/50 mL premix     1 g 100 mL/hr over 30 Minutes Intravenous Every 12 hours 07/24/16 1358 07/25/16 0630   07/24/16 0945  ceFAZolin (ANCEF) IVPB 2g/100 mL premix  Status:  Discontinued     2 g 200 mL/hr over 30 Minutes Intravenous On call to O.R. 07/24/16 0930 07/24/16 1350        Objective:   Vitals:   07/24/16 1400 07/24/16 2127 07/25/16 0004 07/25/16 0638  BP: (!) 145/62 (!) 171/42 (!) 127/49 (!) 142/89  Pulse: 63 76 76 69  Resp: 16     Temp:  98.2 F (36.8 C) 98.6 F (37 C) 98.7 F (37.1 C)  TempSrc:  Oral Oral Oral  SpO2: 100% 94% 96% 97%  Weight:      Height:        Wt Readings from Last 3 Encounters:  07/24/16 59 kg (130 lb)  07/20/16 59 kg (130 lb)  07/01/16 59.9 kg (132 lb)     Intake/Output Summary (Last 24 hours) at 07/25/16 1447 Last data filed at 07/25/16 1300  Gross per 24 hour  Intake                0 ml  Output              900 ml  Net             -900 ml     Physical Exam  Awake, confused, Symmetrical Chest wall movement, Good air movement bilaterally, CTAB RRR,No Gallops,Rubs or new Murmurs, No Parasternal Heave +ve B.Sounds, Abd Soft, No tenderness,  No rebound - guarding or rigidity. No Cyanosis, Clubbing or  edema, small amount of blood can be seen through bandaging, but no active bleeding or oozing,    Data Review:    CBC  Recent Labs Lab 07/22/16 07/24/16 0526 07/25/16 0849  WBC 7.3 10.2 9.6  HGB 8.8* 10.4* 7.5*  HCT 27* 32.1* 23.9*  PLT 219 230 159  MCV  --  91.5 92.6  MCH  --  29.6 29.1  MCHC  --  32.4 31.4  RDW  --  15.7* 16.0*  LYMPHSABS  --  1.0  --   MONOABS  --  0.3  --   EOSABS  --  0.1  --   BASOSABS  --  0.1  --     Chemistries   Recent Labs Lab 07/22/16 07/24/16 0526 07/25/16 0849  NA 138 138 143  K 4.4 4.7 4.7  CL  --  101 113*  CO2  --  27 21*  GLUCOSE  --  93 83  BUN 58* 72* 60*  CREATININE 4.0* 4.89* 4.31*  CALCIUM  --  8.7* 7.5*  AST 11* 20  --   ALT 9* 21  --   ALKPHOS 84 95  --   BILITOT  --  0.5  --    ------------------------------------------------------------------------------------------------------------------ No results for input(s): CHOL, HDL, LDLCALC, TRIG, CHOLHDL, LDLDIRECT in the last 72 hours.  Lab  Results  Component Value Date   HGBA1C 5.3 06/12/2015   ------------------------------------------------------------------------------------------------------------------ No results for input(s): TSH, T4TOTAL, T3FREE, THYROIDAB in the last 72 hours.  Invalid input(s): FREET3 ------------------------------------------------------------------------------------------------------------------ No results for input(s): VITAMINB12, FOLATE, FERRITIN, TIBC, IRON, RETICCTPCT in the last 72 hours.  Coagulation profile  Recent Labs Lab 07/24/16 0526  INR 1.13    No results for input(s): DDIMER in the last 72 hours.  Cardiac Enzymes No results for input(s): CKMB, TROPONINI, MYOGLOBIN in the last 168 hours.  Invalid input(s): CK ------------------------------------------------------------------------------------------------------------------ No results found for: BNP  Inpatient Medications  Scheduled Meds: . amLODipine  5 mg Oral  Daily  . aspirin  81 mg Oral BID  . cholecalciferol  1,000 Units Oral Daily  . feeding supplement  1 Container Oral TID WC  . feeding supplement (ENSURE ENLIVE)  237 mL Oral BID BM  . finasteride  5 mg Oral Daily  . fluticasone  1 spray Each Nare Daily  . loteprednol  1 drop Left Eye Daily  . memantine  28 mg Oral Daily  . mirtazapine  30 mg Oral QHS  . polyvinyl alcohol  1 drop Both Eyes TID  . senna  1 tablet Oral BID  . sodium chloride  1 application Left Eye QHS  . tamsulosin  0.4 mg Oral q morning - 10a  . venlafaxine XR  75 mg Oral Q breakfast   Continuous Infusions: . sodium chloride 50 mL/hr at 07/24/16 1846  . sodium chloride 10 mL/hr at 07/24/16 0954  . methocarbamol (ROBAXIN)  IV     PRN Meds:.acetaminophen **OR** acetaminophen, bisacodyl, HYDROcodone-acetaminophen, methocarbamol **OR** methocarbamol (ROBAXIN)  IV, metoCLOPramide **OR** metoCLOPramide (REGLAN) injection, morphine injection, ondansetron **OR** ondansetron (ZOFRAN) IV, polyethylene glycol  Micro Results Recent Results (from the past 240 hour(s))  Surgical pcr screen     Status: None   Collection Time: 07/24/16  9:53 AM  Result Value Ref Range Status   MRSA, PCR NEGATIVE NEGATIVE Final   Staphylococcus aureus NEGATIVE NEGATIVE Final    Comment:        The Xpert SA Assay (FDA approved for NASAL specimens in patients over 66 years of age), is one component of a comprehensive surveillance program.  Test performance has been validated by [facility] for patients greater than or equal to 26 year old. It is not intended to diagnose infection nor to guide or monitor treatment.     Radiology Reports Dg Chest 1 View  Result Date: 07/24/2016 CLINICAL DATA:  Left hip fracture.  History of hypertension. EXAM: CHEST 1 VIEW COMPARISON:  10/24/2011 FINDINGS: Shallow inspiration with linear atelectasis in the lung bases. Heart size and pulmonary vascularity are normal. No consolidation. No blunting of  costophrenic angles. No pneumothorax. Calcified and tortuous aorta. Old bilateral rib fractures. IMPRESSION: Shallow inspiration with atelectasis in the lung bases. Electronically Signed   By: Lucienne Capers M.D.   On: 07/24/2016 04:55   Dg Knee Complete 4 Views Left  Result Date: 07/24/2016 CLINICAL DATA:  Knee swelling, pain. EXAM: LEFT KNEE - COMPLETE 4+ VIEW COMPARISON:  None. FINDINGS: No acute fracture deformity or dislocation. Severe osteopenia without destructive bony lesions. No advanced degenerative change for age. Severe vascular calcifications. Soft tissue planes are otherwise unremarkable. IMPRESSION: No acute osseous process. Osteopenia which decreases sensitivity for acute nondisplaced fractures. Electronically Signed   By: Elon Alas M.D.   On: 07/24/2016 04:27   Dg C-arm 1-60 Min  Result Date: 07/24/2016 CLINICAL DATA:  Fracture repair in the  left hip. FLUOROSCOPY TIME:  55.1 seconds. Images: 4 EXAM: DG C-ARM 61-120 MIN; OPERATIVE LEFT HIP WITH PELVIS COMPARISON:  Jul 24, 2016 FINDINGS: Rods and nails have been placed across the intertrochanteric fracture on the left. Hardware is in good position. There is a distal interlocking screw. IMPRESSION: Left hip fracture repair as above. Electronically Signed   By: Dorise Bullion III M.D   On: 07/24/2016 12:32   Dg Hip Operative Unilat W Or W/o Pelvis Left  Result Date: 07/24/2016 CLINICAL DATA:  Fracture repair in the left hip. FLUOROSCOPY TIME:  55.1 seconds. Images: 4 EXAM: DG C-ARM 61-120 MIN; OPERATIVE LEFT HIP WITH PELVIS COMPARISON:  Jul 24, 2016 FINDINGS: Rods and nails have been placed across the intertrochanteric fracture on the left. Hardware is in good position. There is a distal interlocking screw. IMPRESSION: Left hip fracture repair as above. Electronically Signed   By: Dorise Bullion III M.D   On: 07/24/2016 12:32   Dg Hip Unilat With Pelvis 2-3 Views Left  Result Date: 07/24/2016 CLINICAL DATA:  Unwitnessed fall.   Left hip pain. EXAM: DG HIP (WITH OR WITHOUT PELVIS) 2-3V LEFT COMPARISON:  None. FINDINGS: There is an acute comminuted fracture of the inter trochanteric left hip with varus angulation. Displaced greater trochanteric fragment. No dislocation of the hip. Pelvis and right hip appear intact. Degenerative changes in the lower lumbar spine. Vascular calcifications. IMPRESSION: Acute comminuted inter trochanteric fracture of the left hip with varus angulation. Electronically Signed   By: Lucienne Capers M.D.   On: 07/24/2016 04:54       ELGERGAWY, DAWOOD M.D on 07/25/2016 at 2:47 PM  Between 7am to 7pm - Pager - [phone]  After 7pm go to [email or web address] - password [facility]  [facility] -  Office  [phone]

## 2016-07-26 ENCOUNTER — Encounter (HOSPITAL_COMMUNITY): Payer: Self-pay | Admitting: *Deleted

## 2016-07-26 ENCOUNTER — Inpatient Hospital Stay (HOSPITAL_COMMUNITY): Payer: Medicare Other

## 2016-07-26 DIAGNOSIS — D62 Acute posthemorrhagic anemia: Secondary | ICD-10-CM

## 2016-07-26 LAB — URINALYSIS, ROUTINE W REFLEX MICROSCOPIC
BILIRUBIN URINE: NEGATIVE
Glucose, UA: NEGATIVE mg/dL
Ketones, ur: NEGATIVE mg/dL
LEUKOCYTES UA: NEGATIVE
NITRITE: NEGATIVE
PROTEIN: 100 mg/dL — AB
Specific Gravity, Urine: 1.014 (ref 1.005–1.030)
pH: 6 (ref 5.0–8.0)

## 2016-07-26 LAB — BASIC METABOLIC PANEL
ANION GAP: 8 (ref 5–15)
BUN: 61 mg/dL — AB (ref 4–21)
BUN: 61 mg/dL — AB (ref 6–20)
CO2: 22 mmol/L (ref 22–32)
Calcium: 7.5 mg/dL — ABNORMAL LOW (ref 8.9–10.3)
Chloride: 113 mmol/L — ABNORMAL HIGH (ref 101–111)
Creatinine, Ser: 4.08 mg/dL — ABNORMAL HIGH (ref 0.61–1.24)
Creatinine: 4.1 mg/dL — AB (ref 0.6–1.3)
GFR, EST AFRICAN AMERICAN: 14 mL/min — AB (ref >60)
GFR, EST NON AFRICAN AMERICAN: 12 mL/min — AB (ref >60)
Glucose, Bld: 99 mg/dL (ref 65–99)
Glucose: 99 mg/dL
POTASSIUM: 5.2 mmol/L — AB (ref 3.5–5.1)
Potassium: 5.2 mmol/L (ref 3.4–5.3)
SODIUM: 143 mmol/L (ref 135–145)
Sodium: 146 mmol/L (ref 137–147)

## 2016-07-26 LAB — CBC
HCT: 23.7 % — ABNORMAL LOW (ref 39.0–52.0)
Hemoglobin: 7.6 g/dL — ABNORMAL LOW (ref 13.0–17.0)
MCH: 29.5 pg (ref 26.0–34.0)
MCHC: 32.1 g/dL (ref 30.0–36.0)
MCV: 91.9 fL (ref 78.0–100.0)
Platelets: 146 10*3/uL — ABNORMAL LOW (ref 150–400)
RBC: 2.58 MIL/uL — ABNORMAL LOW (ref 4.22–5.81)
RDW: 17.4 % — ABNORMAL HIGH (ref 11.5–15.5)
WBC: 25 10*3/uL — AB (ref 4.0–10.5)

## 2016-07-26 LAB — CBC AND DIFFERENTIAL
HEMATOCRIT: 24 % — AB (ref 41–53)
HEMOGLOBIN: 7.6 g/dL — AB (ref 13.5–17.5)
PLATELETS: 146 10*3/uL — AB (ref 150–399)
WBC: 7.5 10*3/mL

## 2016-07-26 LAB — PREPARE RBC (CROSSMATCH)

## 2016-07-26 MED ORDER — SODIUM CHLORIDE 0.9 % IV SOLN
Freq: Once | INTRAVENOUS | Status: AC
  Administered 2016-07-26: 11:00:00 via INTRAVENOUS

## 2016-07-26 NOTE — Progress Notes (Signed)
Subjective: 2 Days Post-Op Procedure(s) (LRB): INTRAMEDULLARY (IM) NAIL INTERTROCHANTRIC (Left) Patient sleeping comfortably in bed. Arousable Objective: Vital signs in last 24 hours: Temp:  [97.4 F (36.3 C)-98.8 F (37.1 C)] 97.4 F (36.3 C) (05/27 1905) Pulse Rate:  [75-130] 75 (05/27 1905) Resp:  [18-20] 20 (05/27 1905) BP: (138-156)/(70-82) 156/71 (05/27 1905) SpO2:  [91 %-99 %] 99 % (05/27 1905)  Intake/Output from previous day: 05/27 0701 - 05/28 0700 In: 0  Out: 200 [Urine:200] Intake/Output this shift: Total I/O In: -  Out: 200 [Urine:200]   Recent Labs  07/24/16 0526 07/25/16 0849 07/26/16 0539  HGB 10.4* 7.5* 7.6*    Recent Labs  07/25/16 0849 07/26/16 0539  WBC 9.6 PENDING  RBC 2.58* 2.58*  HCT 23.9* 23.7*  PLT 159 146*    Recent Labs  07/25/16 0849 07/26/16 0539  NA 143 143  K 4.7 5.2*  CL 113* 113*  CO2 21* 22  BUN 60* 61*  CREATININE 4.31* 4.08*  GLUCOSE 83 99  CALCIUM 7.5* 7.5*    Recent Labs  07/24/16 0526  INR 1.13    Incision: dressing C/D/I No cellulitis present Compartment soft  Hemoglobin stable at 7.6  Assessment/Plan: 2 Days Post-Op Procedure(s) (LRB): INTRAMEDULLARY (IM) NAIL INTERTROCHANTRIC (Left) Up with therapy  Halima Fogal V 07/26/2016, 6:40 AM

## 2016-07-26 NOTE — Progress Notes (Signed)
PROGRESS NOTE                                                                                                                                                                                                             Patient Demographics:    Vincent Potts, is a 83 y.o. male, DOB - [birth year], UJW:[id number]  Admit date - 07/24/2016   Admitting Physician [name], MD  Outpatient Primary MD for the patient is [name], MD  LOS - 2  Chief Complaint  Patient presents with  . Fall  . Knee Pain       Brief Narrative   83 y.o. male with medical history significant of dementia, hypertension, BPH, gait disorder, chronic kidney disease stage IV who was brought to the ED from his skilled nursing facility after he sustained a mechanical fall and left hip Fracture, went for surgical repair 5/26 by Dr. [name].   Subjective:    Vincent Potts today With no significant events overnight, he denies any complaints, afebrile, denies any chest pain or shortness of breath, no nausea or vomiting .   Assessment  & Plan :    Principal Problem:   Hip fracture ([place]) Active Problems:   Hypertension   Hearing loss   Depression   Dementia with behavioral disturbance   BPH (benign prostatic hyperplasia)   CKD (chronic kidney disease) stage 4, GFR 15-29 ml/min (HCC)   Anemia in chronic kidney disease   Adult failure to thrive   Left hip fracture: - Secondary to mechanical fall, once for surgical repair 5/26 by Dr. [name], continue with when necessary pain and nausea medication, DVT prophylaxis per ortho.  Acute on chronic kidney disease stage IV:  -  baseline creatinine between 3-4, was 4.8 on admission, Back to baseline today, avoid nephrotoxic medication , continue with gentle hydration .  Hypertension:  - Blood pressure acceptable, continue with amlodipine   Acute blood loss anemia  - With baseline anemia of chronic kidney disease, he was  transfused 1 unit PRBC on 5/27, hemoglobin remains low at 7.6, so will transfuse another unit today .  BPH - Continue with Flomax and finasteride  Dementia/depression: -  Very hard of hearing-at times not able to answer simple questions. Continue Namenda, Remeron  Adult failure to thrive syndrome:  - Will need PT, nutritional  consult.  Leukocytosis - Blood cell count today is significantly elevated at 25, unclear etiology, he has no diarrhea, no abdominal pain, urinalysis is negative, chest x-ray is negative, afebrile, nontoxic appearing, will recheck in a.m.   Code Status : DNR  Family Communication  : Niece at bedside  Disposition Plan  : SNF  Consults  :  Ortho  Procedures  : INTRAMEDULLARY (IM) NAIL INTERTROCHANTRIC (Left) by Dr. [name] on 5/26  DVT Prophylaxis  :   SCDs ,aspirin  Lab Results  Component Value Date   PLT 146 (L) 07/26/2016    Antibiotics  :    Anti-infectives    Start     Dose/Rate Route Frequency Ordered Stop   07/24/16 1800  ceFAZolin (ANCEF) IVPB 1 g/50 mL premix     1 g 100 mL/hr over 30 Minutes Intravenous Every 12 hours 07/24/16 1358 07/25/16 0630   07/24/16 0945  ceFAZolin (ANCEF) IVPB 2g/100 mL premix  Status:  Discontinued     2 g 200 mL/hr over 30 Minutes Intravenous On call to O.R. 07/24/16 0930 07/24/16 1350        Objective:   Vitals:   07/25/16 1905 07/26/16 0718 07/26/16 1120 07/26/16 1148  BP: (!) 156/71 (!) 143/62 (!) 142/56 (!) 143/58  Pulse: 75 (!) 53 (!) 56 60  Resp: 20 16 18 18   Temp: 97.4 F (36.3 C) 97.6 F (36.4 C) 98.4 F (36.9 C) 97.5 F (36.4 C)  TempSrc: Axillary Oral Oral Oral  SpO2: 99% 93% 100% 100%  Weight:      Height:        Wt Readings from Last 3 Encounters:  07/24/16 59 kg (130 lb)  07/20/16 59 kg (130 lb)  07/01/16 59.9 kg (132 lb)     Intake/Output Summary (Last 24 hours) at 07/26/16 1246 Last data filed at 07/26/16 1148  Gross per 24 hour  Intake               30 ml  Output               200 ml  Net             -170 ml     Physical Exam  Awake, alert, confused Coumadin 2 bilaterally, no wheezing rales or rhonchi RRR,No Gallops,Rubs or new Murmurs, No Parasternal Heave +ve B.Sounds, Abd Soft, No tenderness,  No rebound - guarding or rigidity. Extremities with no clubbing, no cyanosis, no edema, bandage with a scant amount of dry blood, otherwise no discharge or oozing .    Data Review:    CBC  Recent Labs Lab 07/22/16 07/24/16 0526 07/25/16 0849 07/26/16 0539  WBC 7.3 10.2 9.6 25.0*  HGB 8.8* 10.4* 7.5* 7.6*  HCT 27* 32.1* 23.9* 23.7*  PLT 219 230 159 146*  MCV  --  91.5 92.6 91.9  MCH  --  29.6 29.1 29.5  MCHC  --  32.4 31.4 32.1  RDW  --  15.7* 16.0* 17.4*  LYMPHSABS  --  1.0  --   --   MONOABS  --  0.3  --   --   EOSABS  --  0.1  --   --   BASOSABS  --  0.1  --   --     Chemistries   Recent Labs Lab 07/22/16 07/24/16 0526 07/25/16 0849 07/26/16 0539  NA 138 138 143 143  K 4.4 4.7 4.7 5.2*  CL  --  101 113* 113*  CO2  --  27 21* 22  GLUCOSE  --  93 83 99  BUN 58* 72* 60* 61*  CREATININE 4.0* 4.89* 4.31* 4.08*  CALCIUM  --  8.7* 7.5* 7.5*  AST 11* 20  --   --   ALT 9* 21  --   --   ALKPHOS 84 95  --   --   BILITOT  --  0.5  --   --    ------------------------------------------------------------------------------------------------------------------ No results for input(s): CHOL, HDL, LDLCALC, TRIG, CHOLHDL, LDLDIRECT in the last 72 hours.  Lab Results  Component Value Date   HGBA1C 5.3 06/12/2015   ------------------------------------------------------------------------------------------------------------------ No results for input(s): TSH, T4TOTAL, T3FREE, THYROIDAB in the last 72 hours.  Invalid input(s): FREET3 ------------------------------------------------------------------------------------------------------------------ No results for input(s): VITAMINB12, FOLATE, FERRITIN, TIBC, IRON, RETICCTPCT in the last 72  hours.  Coagulation profile  Recent Labs Lab 07/24/16 0526  INR 1.13    No results for input(s): DDIMER in the last 72 hours.  Cardiac Enzymes No results for input(s): CKMB, TROPONINI, MYOGLOBIN in the last 168 hours.  Invalid input(s): CK ------------------------------------------------------------------------------------------------------------------ No results found for: BNP  Inpatient Medications  Scheduled Meds: . amLODipine  5 mg Oral Daily  . aspirin  81 mg Oral BID  . cholecalciferol  1,000 Units Oral Daily  . feeding supplement  1 Container Oral TID WC  . feeding supplement (ENSURE ENLIVE)  237 mL Oral BID BM  . finasteride  5 mg Oral Daily  . fluticasone  1 spray Each Nare Daily  . loteprednol  1 drop Left Eye Daily  . memantine  28 mg Oral Daily  . mirtazapine  30 mg Oral QHS  . polyvinyl alcohol  1 drop Both Eyes TID  . senna  1 tablet Oral BID  . sodium chloride  1 application Left Eye QHS  . tamsulosin  0.4 mg Oral q morning - 10a  . venlafaxine XR  75 mg Oral Q breakfast   Continuous Infusions: . sodium chloride 50 mL/hr at 07/24/16 1846  . sodium chloride 10 mL/hr at 07/24/16 0954  . methocarbamol (ROBAXIN)  IV     PRN Meds:.acetaminophen **OR** acetaminophen, bisacodyl, HYDROcodone-acetaminophen, methocarbamol **OR** methocarbamol (ROBAXIN)  IV, metoCLOPramide **OR** metoCLOPramide (REGLAN) injection, morphine injection, ondansetron **OR** ondansetron (ZOFRAN) IV, polyethylene glycol  Micro Results Recent Results (from the past 240 hour(s))  Surgical pcr screen     Status: None   Collection Time: 07/24/16  9:53 AM  Result Value Ref Range Status   MRSA, PCR NEGATIVE NEGATIVE Final   Staphylococcus aureus NEGATIVE NEGATIVE Final    Comment:        The Xpert SA Assay (FDA approved for NASAL specimens in patients over 24 years of age), is one component of a comprehensive surveillance program.  Test performance has been validated by [facility]  for patients greater than or equal to 43 year old. It is not intended to diagnose infection nor to guide or monitor treatment.     Radiology Reports Dg Chest 1 View  Result Date: 07/24/2016 CLINICAL DATA:  Left hip fracture.  History of hypertension. EXAM: CHEST 1 VIEW COMPARISON:  10/24/2011 FINDINGS: Shallow inspiration with linear atelectasis in the lung bases. Heart size and pulmonary vascularity are normal. No consolidation. No blunting of costophrenic angles. No pneumothorax. Calcified and tortuous aorta. Old bilateral rib fractures. IMPRESSION: Shallow inspiration with atelectasis in the lung bases. Electronically Signed   By: Lucienne Capers M.D.   On: 07/24/2016 04:55   Dg Chest [facility]  Result Date: 07/26/2016 CLINICAL DATA:  Leukocytosis. EXAM: PORTABLE CHEST 1 VIEW COMPARISON:  07/24/2016 FINDINGS: Cardiac silhouette is normal in size. Aortic atherosclerosis is noted. Lung volumes remain diminished with persistent mild atelectasis in the lung bases. No lobar consolidation, edema, sizable pleural effusion, or pneumothorax is identified. Multiple old rib fractures are again noted. IMPRESSION: Low lung volumes with mild bibasilar atelectasis. Electronically Signed   By: Logan Bores M.D.   On: 07/26/2016 08:52   Dg Knee Complete 4 Views Left  Result Date: 07/24/2016 CLINICAL DATA:  Knee swelling, pain. EXAM: LEFT KNEE - COMPLETE 4+ VIEW COMPARISON:  None. FINDINGS: No acute fracture deformity or dislocation. Severe osteopenia without destructive bony lesions. No advanced degenerative change for age. Severe vascular calcifications. Soft tissue planes are otherwise unremarkable. IMPRESSION: No acute osseous process. Osteopenia which decreases sensitivity for acute nondisplaced fractures. Electronically Signed   By: Elon Alas M.D.   On: 07/24/2016 04:27   Dg C-arm 1-60 Min  Result Date: 07/24/2016 CLINICAL DATA:  Fracture repair in the left hip. FLUOROSCOPY TIME:  55.1  seconds. Images: 4 EXAM: DG C-ARM 61-120 MIN; OPERATIVE LEFT HIP WITH PELVIS COMPARISON:  Jul 24, 2016 FINDINGS: Rods and nails have been placed across the intertrochanteric fracture on the left. Hardware is in good position. There is a distal interlocking screw. IMPRESSION: Left hip fracture repair as above. Electronically Signed   By: Dorise Bullion III M.D   On: 07/24/2016 12:32   Dg Hip Operative Unilat W Or W/o Pelvis Left  Result Date: 07/24/2016 CLINICAL DATA:  Fracture repair in the left hip. FLUOROSCOPY TIME:  55.1 seconds. Images: 4 EXAM: DG C-ARM 61-120 MIN; OPERATIVE LEFT HIP WITH PELVIS COMPARISON:  Jul 24, 2016 FINDINGS: Rods and nails have been placed across the intertrochanteric fracture on the left. Hardware is in good position. There is a distal interlocking screw. IMPRESSION: Left hip fracture repair as above. Electronically Signed   By: Dorise Bullion III M.D   On: 07/24/2016 12:32   Dg Hip Unilat With Pelvis 2-3 Views Left  Result Date: 07/24/2016 CLINICAL DATA:  Unwitnessed fall.  Left hip pain. EXAM: DG HIP (WITH OR WITHOUT PELVIS) 2-3V LEFT COMPARISON:  None. FINDINGS: There is an acute comminuted fracture of the inter trochanteric left hip with varus angulation. Displaced greater trochanteric fragment. No dislocation of the hip. Pelvis and right hip appear intact. Degenerative changes in the lower lumbar spine. Vascular calcifications. IMPRESSION: Acute comminuted inter trochanteric fracture of the left hip with varus angulation. Electronically Signed   By: Lucienne Capers M.D.   On: 07/24/2016 04:54       Alfonso Carden M.D on 07/26/2016 at 12:46 PM  Between 7am to 7pm - Pager - [phone]  After 7pm go to [email or web address] - password [facility]  [facility] -  Office  [phone]

## 2016-07-26 NOTE — Progress Notes (Addendum)
Initial Nutrition Assessment  DOCUMENTATION CODES:   Not applicable  INTERVENTION:    Continue Ensure Enlive po BID, each supplement provides 350 kcal and 20 grams of protein  NUTRITION DIAGNOSIS:   Increased nutrient needs related to  (hip fracture) as evidenced by estimated needs  GOAL:   Patient will meet greater than or equal to 90% of their needs  MONITOR:   PO intake, Supplement acceptance, Labs, Weight trends, I & O's  REASON FOR ASSESSMENT:   Malnutrition Screening Tool  ASSESSMENT:   83 y.o. male with medical history significant of dementia, hypertension, BPH, gait disorder, chronic kidney disease stage IV who was brought to the ED from his skilled nursing facility after he sustained a mechanical fall and left hip Fracture, went for surgical repair 5/26 by Dr. [name].  Pt sleeping upon RD visit.  Attempt to wake x 2. S/p treatment of intertrochanteric, pertrochanteric, subtrochanteric fracture with intramedullary implant 5/26. Bedside swallow evaluation performed 5/27.  Pt with mild aspiration risk given advanced dementia.  Per flowsheets, pt refusing meals.  Tray untouched on table.  Noted opened Ensure Enlive. Labs and medications reviewed.  Unable to complete Nutrition-Focused physical exam at this time.   Diet Order:  DIET DYS 3 Room service appropriate? Yes; Fluid consistency: Thin  Skin:  Reviewed, no issues  Last BM:  5/27  Height:   Ht Readings from Last 1 Encounters:  07/24/16 5\' 5"  (1.651 m)   Weight:   Wt Readings from Last 1 Encounters:  07/24/16 130 lb (59 kg)   Ideal Body Weight:  56.8 kg  BMI:  Body mass index is 21.63 kg/m.  Estimated Nutritional Needs:   Kcal:  1500-1700  Protein:  70-80 gm  Fluid:  1.5-1.7 L  EDUCATION NEEDS:   No education needs identified at this time  Arthur Holms, RD, LDN Pager #: [phone] After-Hours Pager #: [phone]

## 2016-07-27 LAB — CBC WITH DIFFERENTIAL/PLATELET
BASOS PCT: 0 %
Basophils Absolute: 0 10*3/uL (ref 0.0–0.1)
EOS ABS: 0.2 10*3/uL (ref 0.0–0.7)
Eosinophils Relative: 1 %
HCT: 23.5 % — ABNORMAL LOW (ref 39.0–52.0)
HEMOGLOBIN: 7.6 g/dL — AB (ref 13.0–17.0)
Lymphocytes Relative: 10 %
Lymphs Abs: 1.7 10*3/uL (ref 0.7–4.0)
MCH: 29.5 pg (ref 26.0–34.0)
MCHC: 32.3 g/dL (ref 30.0–36.0)
MCV: 91.1 fL (ref 78.0–100.0)
MONOS PCT: 4 %
Monocytes Absolute: 0.6 10*3/uL (ref 0.1–1.0)
NEUTROS PCT: 85 %
Neutro Abs: 15 10*3/uL — ABNORMAL HIGH (ref 1.7–7.7)
Platelets: 141 10*3/uL — ABNORMAL LOW (ref 150–400)
RBC: 2.58 MIL/uL — AB (ref 4.22–5.81)
RDW: 17.4 % — ABNORMAL HIGH (ref 11.5–15.5)
WBC: 17.6 10*3/uL — AB (ref 4.0–10.5)

## 2016-07-27 LAB — PREPARE RBC (CROSSMATCH)

## 2016-07-27 LAB — BASIC METABOLIC PANEL
Anion gap: 7 (ref 5–15)
BUN: 65 mg/dL — AB (ref 4–21)
BUN: 65 mg/dL — ABNORMAL HIGH (ref 6–20)
CALCIUM: 7.5 mg/dL — AB (ref 8.9–10.3)
CO2: 20 mmol/L — ABNORMAL LOW (ref 22–32)
CREATININE: 4.03 mg/dL — AB (ref 0.61–1.24)
Chloride: 117 mmol/L — ABNORMAL HIGH (ref 101–111)
Creatinine: 4 mg/dL — AB (ref 0.6–1.3)
GFR, EST AFRICAN AMERICAN: 14 mL/min — AB (ref >60)
GFR, EST NON AFRICAN AMERICAN: 12 mL/min — AB (ref >60)
Glucose, Bld: 87 mg/dL (ref 65–99)
Glucose: 87 mg/dL
POTASSIUM: 4.5 mmol/L (ref 3.4–5.3)
Potassium: 4.5 mmol/L (ref 3.5–5.1)
SODIUM: 144 mmol/L (ref 137–147)
Sodium: 144 mmol/L (ref 135–145)

## 2016-07-27 LAB — CBC AND DIFFERENTIAL
HEMATOCRIT: 24 % — AB (ref 41–53)
HEMOGLOBIN: 7.6 g/dL — AB (ref 13.5–17.5)
Platelets: 141 10*3/uL — AB (ref 150–399)
WBC: 17.6 10^3/mL

## 2016-07-27 MED ORDER — SODIUM CHLORIDE 0.9 % IV SOLN
Freq: Once | INTRAVENOUS | Status: AC
  Administered 2016-07-27: 12:00:00 via INTRAVENOUS

## 2016-07-27 MED ORDER — SODIUM CHLORIDE (HYPERTONIC) 5 % OP SOLN
1.0000 [drp] | Freq: Every day | OPHTHALMIC | Status: DC
  Administered 2016-07-27 – 2016-07-28 (×2): 1 [drp] via OPHTHALMIC
  Filled 2016-07-27: qty 15

## 2016-07-27 NOTE — Progress Notes (Signed)
Physical Therapy Treatment Patient Details Name: Vincent Potts MRN: [id number] DOB: [birth year] Today's Date: 07/27/2016    History of Present Illness 83 y.o.male admitted to ED from SNF for mechanical fall. Apparently patient fell sometime 5/25-it was a unwitnessed fall-subsequently patient started complaining of pain in his knee and his left hip. Underwent surgery on 5/26 for intramedullary nail intertrachantric (left) procedure. Ptwith medical history significant of dementia, hypertension, BPH, gait disorder, and chronic kidney disease stage IV.    PT Comments    Pt continues to be limited by pain with mobility and is making slow progress towards his goals. Pt currently is maxAx2 for bed mobility and maxAx2 for sit<>stand and squat pivot transfers. Pt requires skilled PT to progress bed mobility and transfer training and to begin gait training to be able to safely mobilize in his discharge location.     Follow Up Recommendations  SNF;Supervision/Assistance - 24 hour     Equipment Recommendations  None recommended by PT    Recommendations for Other Services       Precautions / Restrictions Precautions Precautions: Fall Restrictions Weight Bearing Restrictions: Yes LLE Weight Bearing: Partial weight bearing LLE Partial Weight Bearing Percentage or Pounds: 50%    Mobility  Bed Mobility Overal bed mobility: Needs Assistance Bed Mobility: Supine to Sit     Supine to sit: Max assist;+2 for physical assistance     General bed mobility comments: Pt required A for managing BLE and transitioning hips with Pad. Max A for elevating trunk due to pt's pain level  Transfers Overall transfer level: Needs assistance Equipment used: None Transfers: Squat Pivot Transfers Sit to Stand: Max assist;+2 safety/equipment   Squat pivot transfers: Max assist;+2 safety/equipment     General transfer comment: Pt able to bare minimal weight through RLE to assist with transfers. Needs tactile  cues to initiate and complete transfer.       Balance Overall balance assessment: Needs assistance Sitting-balance support: Feet supported;No upper extremity supported Sitting balance-Leahy Scale: Fair Sitting balance - Comments: Pt able to perform grooming at EOB without UE support for ~3-4 min.  Postural control: Posterior lean;Other (comment) Standing balance support: During functional activity;No upper extremity supported Standing balance-Leahy Scale: Poor                              Cognition Arousal/Alertness: Awake/alert Behavior During Therapy: WFL for tasks assessed/performed (Anxiety related to back pain) Overall Cognitive Status: History of cognitive impairments - at baseline                                           General Comments General comments (skin integrity, edema, etc.): Pt performed session on roomair with no signs of dyspnea. Pt placed on 2L at end of session. SpO2      Pertinent Vitals/Pain Pain Assessment: Faces Faces Pain Scale: Hurts even more Pain Location: back pain and LLE with movement Pain Descriptors / Indicators: Constant;Grimacing;Discomfort;Moaning Pain Intervention(s): Limited activity within patient's tolerance;Monitored during session         VSS         PT Goals (current goals can now be found in the care plan section) Acute Rehab PT Goals Patient Stated Goal: pt unable to state PT Goal Formulation: Patient unable to participate in goal setting Time For Goal Achievement: 08/01/16 Potential to Achieve  Goals: Fair Progress towards PT goals: Progressing toward goals    Frequency    Min 3X/week      PT Plan Current plan remains appropriate    Co-evaluation   Reason for Co-Treatment: Complexity of the patient's impairments (multi-system involvement) PT goals addressed during session: Mobility/safety with mobility OT goals addressed during session: ADL's and self-care      AM-PAC PT "6  Clicks" Daily Activity  Outcome Measure  Difficulty turning over in bed (including adjusting bedclothes, sheets and blankets)?: Total Difficulty moving from lying on back to sitting on the side of the bed? : Total Difficulty sitting down on and standing up from a chair with arms (e.g., wheelchair, bedside commode, etc,.)?: Total Help needed moving to and from a bed to chair (including a wheelchair)?: A Lot Help needed walking in hospital room?: Total Help needed climbing 3-5 steps with a railing? : Total 6 Click Score: 7    End of Session Equipment Utilized During Treatment: Gait belt Activity Tolerance: Patient limited by pain Patient left: in chair;with chair alarm set;with call bell/phone within reach Nurse Communication: Mobility status PT Visit Diagnosis: Difficulty in walking, not elsewhere classified (R26.2)     Time: 9381-0175 PT Time Calculation (min) (ACUTE ONLY): 31 min  Charges:  $Therapeutic Activity: 8-22 mins                    G Codes:       Iana Buzan B. Migdalia Dk PT, DPT Acute Rehabilitation  [phone] Pager [phone]     [place] 07/27/2016, 12:54 PM

## 2016-07-27 NOTE — Progress Notes (Signed)
Subjective: 3 Days Post-Op Procedure(s) (LRB): INTRAMEDULLARY (IM) NAIL INTERTROCHANTRIC (Left) Patient sleeping comfortably in bed. Arousable Objective: Vital signs in last 24 hours: Temp:  [98 F (36.7 C)-98.5 F (36.9 C)] 98.5 F (36.9 C) (05/29 1158) Pulse Rate:  [62-89] 64 (05/29 1158) Resp:  [16-18] 16 (05/29 1158) BP: (114-137)/(56-67) 135/67 (05/29 1158) SpO2:  [92 %-100 %] 98 % (05/29 1158)  Intake/Output from previous day: 05/28 0701 - 05/29 0700 In: 3620.7 [P.O.:840; I.V.:2484.2; Blood:296.5] Out: 1050 [Urine:1050] Intake/Output this shift: Total I/O In: 690 [P.O.:440; I.V.:250] Out: -    Recent Labs  07/25/16 0849 07/26/16 0539 07/27/16 0552  HGB 7.5* 7.6* 7.6*    Recent Labs  07/26/16 0539 07/27/16 0552  WBC 25.0* 17.6*  RBC 2.58* 2.58*  HCT 23.7* 23.5*  PLT 146* 141*    Recent Labs  07/26/16 0539 07/27/16 0552  NA 143 144  K 5.2* 4.5  CL 113* 117*  CO2 22 20*  BUN 61* 65*  CREATININE 4.08* 4.03*  GLUCOSE 99 87  CALCIUM 7.5* 7.5*   No results for input(s): LABPT, INR in the last 72 hours. LLE- Incision: dressing C/D/I No cellulitis present Compartment soft    Assessment/Plan: 3 Days Post-Op Procedure(s) (LRB): INTRAMEDULLARY (IM) NAIL INTERTROCHANTRIC (Left) Up with therapy  50% WB on LLE Dressing changes PRN, keep wound dry until follow up Bid asa with SCDs for DVT ppx Return to see [name] in 2 weeks for wound check  Nicholes Stairs 07/27/2016, 1:31 PM

## 2016-07-27 NOTE — Progress Notes (Signed)
Physical Therapy Treatment Patient Details Name: Vincent Potts MRN: [id number] DOB: [birth year] Today's Date: 07/27/2016    History of Present Illness 83 y.o.male admitted to ED from SNF for mechanical fall. Apparently patient fell sometime 5/25-it was a unwitnessed fall-subsequently patient started complaining of pain in his knee and his left hip. Underwent surgery on 5/26 for intramedullary nail intertrachantric (left) procedure. Ptwith medical history significant of dementia, hypertension, BPH, gait disorder, and chronic kidney disease stage IV.    PT Comments    Assisted nursing tech to get pt back into bed after sitting up for four hours. Pt able to squat transfer from recliner to bed with modAx2 and supine to sit with maxAx2. Pt is able to participate more fully in his mobility this afternoon. Pt requires skilled PT to continue to progress bed mobility and transfer training and to begin gait training and to increase LE strength and endurance to safely mobilize in his discharge environment.   Follow Up Recommendations  SNF;Supervision/Assistance - 24 hour     Equipment Recommendations  None recommended by PT    Recommendations for Other Services       Precautions / Restrictions Precautions Precautions: Fall Restrictions Weight Bearing Restrictions: Yes LLE Weight Bearing: Partial weight bearing LLE Partial Weight Bearing Percentage or Pounds: 50%    Mobility  Bed Mobility Overal bed mobility: Needs Assistance Bed Mobility: Supine to Sit     Supine to sit: Max assist;+2 for physical assistance Sit to supine: Max assist;+2 for physical assistance   General bed mobility comments: Pt with excessive posterior lean once seated EoB vc for leaning forward and leaning down toward head of bed instead of backward. requires A for managing BLE into bed and for managing trunk down to bed  Transfers Overall transfer level: Needs assistance Equipment used: None Transfers: Squat Pivot  Transfers Sit to Stand: Max assist;+2 safety/equipment   Squat pivot transfers: Max assist;+2 safety/equipment     General transfer comment: Pt able to maintain PWB through L LE secondary to pain. vc for reaching R hand toward bed surface and for pivoting on R LE to sit EoB.       Balance Overall balance assessment: Needs assistance Sitting-balance support: Feet supported;No upper extremity supported Sitting balance-Leahy Scale: Fair Sitting balance - Comments: Pt able to perform grooming at EOB without UE support for ~3-4 min.  Postural control: Posterior lean;Other (comment) Standing balance support: During functional activity;No upper extremity supported Standing balance-Leahy Scale: Zero                              Cognition Arousal/Alertness: Awake/alert Behavior During Therapy: WFL for tasks assessed/performed (Anxiety related to back pain) Overall Cognitive Status: History of cognitive impairments - at baseline                                           General Comments General comments (skin integrity, edema, etc.): Pt performed session on roomair with no signs of dyspnea. Pt placed on 2L at end of session. SpO2      Pertinent Vitals/Pain Pain Assessment: Faces Faces Pain Scale: Hurts even more Pain Location: back pain and LLE with movement Pain Descriptors / Indicators: Constant;Grimacing;Discomfort;Moaning Pain Intervention(s): Monitored during session  VSS           PT Goals (current goals can now  be found in the care plan section) Acute Rehab PT Goals Patient Stated Goal: pt unable to state PT Goal Formulation: Patient unable to participate in goal setting Time For Goal Achievement: 08/01/16 Potential to Achieve Goals: Fair Progress towards PT goals: Progressing toward goals    Frequency    Min 3X/week      PT Plan Current plan remains appropriate    Co-evaluation   Reason for Co-Treatment: Complexity of the  patient's impairments (multi-system involvement) PT goals addressed during session: Mobility/safety with mobility OT goals addressed during session: ADL's and self-care      AM-PAC PT "6 Clicks" Daily Activity  Outcome Measure  Difficulty turning over in bed (including adjusting bedclothes, sheets and blankets)?: Total Difficulty moving from lying on back to sitting on the side of the bed? : Total Difficulty sitting down on and standing up from a chair with arms (e.g., wheelchair, bedside commode, etc,.)?: Total Help needed moving to and from a bed to chair (including a wheelchair)?: A Lot Help needed walking in hospital room?: Total Help needed climbing 3-5 steps with a railing? : Total 6 Click Score: 7    End of Session Equipment Utilized During Treatment: Gait belt Activity Tolerance: Patient limited by pain Patient left: in chair;with chair alarm set;with call bell/phone within reach Nurse Communication: Mobility status PT Visit Diagnosis: Difficulty in walking, not elsewhere classified (R26.2)     Time: 5638-7564 PT Time Calculation (min) (ACUTE ONLY): 9 min  Charges:  $Therapeutic Activity: 8-22 mins                    G Codes:       Sears Oran B. Migdalia Dk PT, DPT Acute Rehabilitation  [phone] Pager [phone]     [place] 07/27/2016, 2:18 PM

## 2016-07-27 NOTE — Progress Notes (Signed)
  Speech Language Pathology Treatment: Dysphagia  Patient Details Name: SALADIN PETRELLI MRN: [id number] DOB: 1925/01/02 Today's Date: 07/27/2016 Time: 7062-3762 SLP Time Calculation (min) (ACUTE ONLY): 8 min  Assessment / Plan / Recommendation Clinical Impression  Pt seen for dysphagia safety with current diet and potential for upgrade. Liquids consumed via straw without s/s aspiration. He needed much encouragement to attempt solids. Mildly prolonged mastication however functional. SLP upgrading to regular texture remain thin liquids, straws allowed and pills with thin. No further ST needed.   HPI HPI: Pt is a 83 y.o. male with PMH of dementia, hypertension, BPH, gait disorder, chronic kidney disease stage IV who was brought to the ED from his skilled nursing facility after he sustained a mechanical fall and left hip and knee pain. Patient is a very poor historian-he has advanced dementia and also has significant hearing issues-most of this history is obtained after reviewing ED chart. Apparently patient fell sometime 5/25-it was a unwitnessed fall-subsequently patient started complaining of pain in his knee and his left hip. He was brought to the emergency room, where a x-ray showed a left hip fracture. Orthopedics (Dr. Doran Durand) was consulted by the emergency department physician, recommendations were to transfer to [facility] for hip repair later today by Dr. [name]. Bedside swallow eval ordered.      SLP Plan  Discharge SLP treatment due to (comment);All goals met       Recommendations  Diet recommendations: Regular;Thin liquid Liquids provided via: Cup;Straw Medication Administration: Whole meds with liquid Supervision: Patient able to self feed;Intermittent supervision to cue for compensatory strategies Compensations: Slow rate;Small sips/bites Postural Changes and/or Swallow Maneuvers: Seated upright 90 degrees                Oral Care Recommendations: Oral care BID Follow  up Recommendations: 24 hour supervision/assistance SLP Visit Diagnosis: Dysphagia, unspecified (R13.10) Plan: Discharge SLP treatment due to (comment);All goals met       GO                Houston Siren 07/27/2016, 10:52 AM   Orbie Pyo Colvin Caroli.Ed [employer] [phone]

## 2016-07-27 NOTE — Progress Notes (Signed)
PROGRESS NOTE                                                                                                                                                                                                             Patient Demographics:    Vincent Potts, is a 83 y.o. male, DOB - 1924/07/21, WGY:[id number]  Admit date - 07/24/2016   Admitting Physician [name], MD  Outpatient Primary MD for the patient is [name], MD  LOS - 3  Chief Complaint  Patient presents with  . Fall  . Knee Pain       Brief Narrative   83 y.o. male with medical history significant of dementia, hypertension, BPH, gait disorder, chronic kidney disease stage IV who was brought to the ED from his skilled nursing facility after he sustained a mechanical fall and left hip Fracture, went for surgical repair 5/26 by Dr. [name].Patient with postoperative anemia, requiring 2 units PRBC transfusions so far.   Subjective:    Vincent Potts today Denies any complaints, no nausea, no vomiting, no shortness of breath, reports left hip pain is controlled, .   Assessment  & Plan :    Principal Problem:   Hip fracture ([name]) Active Problems:   Hypertension   Hearing loss   Depression   Dementia with behavioral disturbance   BPH (benign prostatic hyperplasia)   CKD (chronic kidney disease) stage 4, GFR 15-29 ml/min (HCC)   Anemia in chronic kidney disease   Adult failure to thrive   Left hip fracture: - Secondary to mechanical fall, once for surgical repair 5/26 by Dr. [name], continue with when necessary pain and nausea medication, DVT prophylaxis per ortho.Seen by PT, will need SNF placement  Acute on chronic kidney disease stage IV:  -  baseline creatinine between 3-4, was 4.8 on admission, Back to baseline today, avoid nephrotoxic medication , continue with gentle hydration .  Hypertension:  - Blood pressure acceptable, continue with amlodipine   Acute blood  loss anemia  - Patient with baseline anemia of chronic kidney disease , baseline hemoglobin around 9, with postoperative anemia, issues 1 units PRBC on 5/27, hemoglobin remains low at 7.6 today, so will transfuse another unit today, and will recheck CBC in a.m. .  BPH - Continue with Flomax and finasteride  Dementia/depression: -  Very  hard of hearing-at times not able to answer simple questions. Continue Namenda, Remeron  Adult failure to thrive syndrome:  - Will need PT, nutritional consult.  Leukocytosis - Blood cell count significantly elevated after surgery, was 25, unclear etiology, no diarrhea, no abdominal pain, no fever, no chills, he is nontoxic appearing, negative urinalysis, chest x-ray is negative, trending down, but was unconscious 17 today, recheck in a.m. .   Code Status : DNR  Family Communication  : Niece at bedside  Disposition Plan  : SNF  Consults  :  Ortho  Procedures  : INTRAMEDULLARY (IM) NAIL INTERTROCHANTRIC (Left) by Dr. [name] on 5/26  DVT Prophylaxis  :   SCDs ,aspirin  Lab Results  Component Value Date   PLT 141 (L) 07/27/2016    Antibiotics  :    Anti-infectives    Start     Dose/Rate Route Frequency Ordered Stop   07/24/16 1800  ceFAZolin (ANCEF) IVPB 1 g/50 mL premix     1 g 100 mL/hr over 30 Minutes Intravenous Every 12 hours 07/24/16 1358 07/25/16 0630   07/24/16 0945  ceFAZolin (ANCEF) IVPB 2g/100 mL premix  Status:  Discontinued     2 g 200 mL/hr over 30 Minutes Intravenous On call to O.R. 07/24/16 0930 07/24/16 1350        Objective:   Vitals:   07/26/16 1900 07/27/16 0610 07/27/16 1140 07/27/16 1158  BP: 124/64 (!) 118/56 137/64 135/67  Pulse: 89 62 63 64  Resp: 18 18 18 16   Temp: 98.1 F (36.7 C) 98.5 F (36.9 C) 98.4 F (36.9 C) 98.5 F (36.9 C)  TempSrc: Oral Oral Oral Oral  SpO2: 92% 93% 100% 98%  Weight:      Height:        Wt Readings from Last 3 Encounters:  07/24/16 59 kg (130 lb)  07/20/16 59 kg (130  lb)  07/01/16 59.9 kg (132 lb)     Intake/Output Summary (Last 24 hours) at 07/27/16 1325 Last data filed at 07/27/16 1143  Gross per 24 hour  Intake          4040.66 ml  Output             1050 ml  Net          2990.66 ml     Physical Exam  Awake alert, confused Good air entry bilaterally, no wheezing, rales or rhonchi RRR,No Gallops,Rubs or new Murmurs, No Parasternal Heave Abdomen soft, nontender, nondistended, bowel sounds present  Extremity with no edema, clubbing or cyanosis, good capillary refills bilaterally, significant bleeding or ecchymosis at surgical site .   Data Review:    CBC  Recent Labs Lab 07/22/16 07/24/16 0526 07/25/16 0849 07/26/16 0539 07/27/16 0552  WBC 7.3 10.2 9.6 25.0* 17.6*  HGB 8.8* 10.4* 7.5* 7.6* 7.6*  HCT 27* 32.1* 23.9* 23.7* 23.5*  PLT 219 230 159 146* 141*  MCV  --  91.5 92.6 91.9 91.1  MCH  --  29.6 29.1 29.5 29.5  MCHC  --  32.4 31.4 32.1 32.3  RDW  --  15.7* 16.0* 17.4* 17.4*  LYMPHSABS  --  1.0  --   --  1.7  MONOABS  --  0.3  --   --  0.6  EOSABS  --  0.1  --   --  0.2  BASOSABS  --  0.1  --   --  0.0    Chemistries   Recent Labs Lab 07/22/16 07/24/16 0526 07/25/16  9678 07/26/16 0539 07/27/16 0552  NA 138 138 143 143 144  K 4.4 4.7 4.7 5.2* 4.5  CL  --  101 113* 113* 117*  CO2  --  27 21* 22 20*  GLUCOSE  --  93 83 99 87  BUN 58* 72* 60* 61* 65*  CREATININE 4.0* 4.89* 4.31* 4.08* 4.03*  CALCIUM  --  8.7* 7.5* 7.5* 7.5*  AST 11* 20  --   --   --   ALT 9* 21  --   --   --   ALKPHOS 84 95  --   --   --   BILITOT  --  0.5  --   --   --    ------------------------------------------------------------------------------------------------------------------ No results for input(s): CHOL, HDL, LDLCALC, TRIG, CHOLHDL, LDLDIRECT in the last 72 hours.  Lab Results  Component Value Date   HGBA1C 5.3 06/12/2015    ------------------------------------------------------------------------------------------------------------------ No results for input(s): TSH, T4TOTAL, T3FREE, THYROIDAB in the last 72 hours.  Invalid input(s): FREET3 ------------------------------------------------------------------------------------------------------------------ No results for input(s): VITAMINB12, FOLATE, FERRITIN, TIBC, IRON, RETICCTPCT in the last 72 hours.  Coagulation profile  Recent Labs Lab 07/24/16 0526  INR 1.13    No results for input(s): DDIMER in the last 72 hours.  Cardiac Enzymes No results for input(s): CKMB, TROPONINI, MYOGLOBIN in the last 168 hours.  Invalid input(s): CK ------------------------------------------------------------------------------------------------------------------ No results found for: BNP  Inpatient Medications  Scheduled Meds: . amLODipine  5 mg Oral Daily  . cholecalciferol  1,000 Units Oral Daily  . feeding supplement  1 Container Oral TID WC  . feeding supplement (ENSURE ENLIVE)  237 mL Oral BID BM  . finasteride  5 mg Oral Daily  . fluticasone  1 spray Each Nare Daily  . loteprednol  1 drop Left Eye Daily  . memantine  28 mg Oral Daily  . mirtazapine  30 mg Oral QHS  . polyvinyl alcohol  1 drop Both Eyes TID  . senna  1 tablet Oral BID  . sodium chloride  1 application Left Eye QHS  . tamsulosin  0.4 mg Oral q morning - 10a  . venlafaxine XR  75 mg Oral Q breakfast   Continuous Infusions: . sodium chloride 50 mL/hr at 07/26/16 1548  . sodium chloride 10 mL/hr at 07/24/16 0954  . methocarbamol (ROBAXIN)  IV     PRN Meds:.acetaminophen **OR** acetaminophen, bisacodyl, HYDROcodone-acetaminophen, methocarbamol **OR** methocarbamol (ROBAXIN)  IV, metoCLOPramide **OR** metoCLOPramide (REGLAN) injection, morphine injection, ondansetron **OR** ondansetron (ZOFRAN) IV, polyethylene glycol  Micro Results Recent Results (from the past 240 hour(s))  Surgical  pcr screen     Status: None   Collection Time: 07/24/16  9:53 AM  Result Value Ref Range Status   MRSA, PCR NEGATIVE NEGATIVE Final   Staphylococcus aureus NEGATIVE NEGATIVE Final    Comment:        The Xpert SA Assay (FDA approved for NASAL specimens in patients over 27 years of age), is one component of a comprehensive surveillance program.  Test performance has been validated by [facility] for patients greater than or equal to 81 year old. It is not intended to diagnose infection nor to guide or monitor treatment.     Radiology Reports Dg Chest 1 View  Result Date: 07/24/2016 CLINICAL DATA:  Left hip fracture.  History of hypertension. EXAM: CHEST 1 VIEW COMPARISON:  10/24/2011 FINDINGS: Shallow inspiration with linear atelectasis in the lung bases. Heart size and pulmonary vascularity are normal. No consolidation. No blunting of  costophrenic angles. No pneumothorax. Calcified and tortuous aorta. Old bilateral rib fractures. IMPRESSION: Shallow inspiration with atelectasis in the lung bases. Electronically Signed   By: Lucienne Capers M.D.   On: 07/24/2016 04:55   Dg Chest Port 1 View  Result Date: 07/26/2016 CLINICAL DATA:  Leukocytosis. EXAM: PORTABLE CHEST 1 VIEW COMPARISON:  07/24/2016 FINDINGS: Cardiac silhouette is normal in size. Aortic atherosclerosis is noted. Lung volumes remain diminished with persistent mild atelectasis in the lung bases. No lobar consolidation, edema, sizable pleural effusion, or pneumothorax is identified. Multiple old rib fractures are again noted. IMPRESSION: Low lung volumes with mild bibasilar atelectasis. Electronically Signed   By: Logan Bores M.D.   On: 07/26/2016 08:52   Dg Knee Complete 4 Views Left  Result Date: 07/24/2016 CLINICAL DATA:  Knee swelling, pain. EXAM: LEFT KNEE - COMPLETE 4+ VIEW COMPARISON:  None. FINDINGS: No acute fracture deformity or dislocation. Severe osteopenia without destructive bony lesions. No advanced  degenerative change for age. Severe vascular calcifications. Soft tissue planes are otherwise unremarkable. IMPRESSION: No acute osseous process. Osteopenia which decreases sensitivity for acute nondisplaced fractures. Electronically Signed   By: Elon Alas M.D.   On: 07/24/2016 04:27   Dg C-arm 1-60 Min  Result Date: 07/24/2016 CLINICAL DATA:  Fracture repair in the left hip. FLUOROSCOPY TIME:  55.1 seconds. Images: 4 EXAM: DG C-ARM 61-120 MIN; OPERATIVE LEFT HIP WITH PELVIS COMPARISON:  Jul 24, 2016 FINDINGS: Rods and nails have been placed across the intertrochanteric fracture on the left. Hardware is in good position. There is a distal interlocking screw. IMPRESSION: Left hip fracture repair as above. Electronically Signed   By: Dorise Bullion III M.D   On: 07/24/2016 12:32   Dg Hip Operative Unilat W Or W/o Pelvis Left  Result Date: 07/24/2016 CLINICAL DATA:  Fracture repair in the left hip. FLUOROSCOPY TIME:  55.1 seconds. Images: 4 EXAM: DG C-ARM 61-120 MIN; OPERATIVE LEFT HIP WITH PELVIS COMPARISON:  Jul 24, 2016 FINDINGS: Rods and nails have been placed across the intertrochanteric fracture on the left. Hardware is in good position. There is a distal interlocking screw. IMPRESSION: Left hip fracture repair as above. Electronically Signed   By: Dorise Bullion III M.D   On: 07/24/2016 12:32   Dg Hip Unilat With Pelvis 2-3 Views Left  Result Date: 07/24/2016 CLINICAL DATA:  Unwitnessed fall.  Left hip pain. EXAM: DG HIP (WITH OR WITHOUT PELVIS) 2-3V LEFT COMPARISON:  None. FINDINGS: There is an acute comminuted fracture of the inter trochanteric left hip with varus angulation. Displaced greater trochanteric fragment. No dislocation of the hip. Pelvis and right hip appear intact. Degenerative changes in the lower lumbar spine. Vascular calcifications. IMPRESSION: Acute comminuted inter trochanteric fracture of the left hip with varus angulation. Electronically Signed   By: Lucienne Capers  M.D.   On: 07/24/2016 04:54       Meeah Totino M.D on 07/27/2016 at 1:25 PM  Between 7am to 7pm - Pager - [phone]  After 7pm go to [email or web address] - password [facility]  [facility] -  Office  [phone]

## 2016-07-27 NOTE — Progress Notes (Signed)
Occupational Therapy Treatment Patient Details Name: Vincent Potts MRN: [id number] DOB: 17-Jan-1925 Today's Date: 07/27/2016    History of present illness 83 y.o.male admitted to ED from SNF for mechanical fall. Apparently patient fell sometime 5/25-it was a unwitnessed fall-subsequently patient started complaining of pain in his knee and his left hip. Underwent surgery on 5/26 for intramedullary nail intertrachantric (left) procedure. Ptwith medical history significant of dementia, hypertension, BPH, gait disorder, and chronic kidney disease stage IV.   OT comments  Pt progressing towards established goals. Performed bathing with Max A and Max VCs. Participated in peri care and washing face at EOB with Max VCs. Tranfers from EOB to recliner with Max A +2. Will continue to follow while admitted to increase occupational performance and participation. Continue to recommend dc to SNF.    Follow Up Recommendations  SNF;Supervision/Assistance - 24 hour    Equipment Recommendations  Other (comment) (Defer to next venue)    Recommendations for Other Services PT consult    Precautions / Restrictions Precautions Precautions: Fall Restrictions Weight Bearing Restrictions: Yes LLE Weight Bearing: Partial weight bearing LLE Partial Weight Bearing Percentage or Pounds: 50%       Mobility Bed Mobility Overal bed mobility: Needs Assistance Bed Mobility: Supine to Sit     Supine to sit: Max assist;+2 for physical assistance     General bed mobility comments: Pt required A for managing BLE and transitioning hips with Pad. Max A for elevating trunk due to pt's pain level  Transfers Overall transfer level: Needs assistance Equipment used: None Transfers: Squat Pivot Transfers Sit to Stand: Max assist;+2 safety/equipment   Squat pivot transfers: Max assist;+2 safety/equipment     General transfer comment: Pt able to weight bear through RLE to assist with transfers. Needs tactile cues to  initiate and complete transfer.    Balance Overall balance assessment: Needs assistance Sitting-balance support: Feet supported;No upper extremity supported Sitting balance-Leahy Scale: Fair Sitting balance - Comments: Pt able to perform grooming at EOB without UE support for ~3-4 min.  Postural control: Posterior lean;Other (comment) Standing balance support: During functional activity;No upper extremity supported Standing balance-Leahy Scale: Poor                             ADL either performed or assessed with clinical judgement   ADL Overall ADL's : Needs assistance/impaired     Grooming: Wash/dry face;Maximal assistance;Cueing for sequencing Grooming Details (indicate cue type and reason): Able to bring hand to face with Max VCs for sequencing and initiation     Lower Body Bathing: Maximal assistance;Sit to/from stand;+2 for physical assistance Lower Body Bathing Details (indicate cue type and reason): Pt performed peri care with Max VCs. Pt sit<>stand with Max A +2 and A to LB bathing after soiling self in bed.  Upper Body Dressing : Maximal assistance;Sitting Upper Body Dressing Details (indicate cue type and reason): New gown                 Functional mobility during ADLs: Maximal assistance;+2 for physical assistance;+2 for safety/equipment General ADL Comments: Pt Max A for ADLs and functional mobility     Vision       Perception     Praxis      Cognition Arousal/Alertness: Awake/alert Behavior During Therapy: WFL for tasks assessed/performed (Anxiety related to back pain) Overall Cognitive Status: History of cognitive impairments - at baseline  Exercises     Shoulder Instructions       General Comments Pt performed session on roomair with no signs of dyspnea. Pt placed on 2L at end of session. SpO2    Pertinent Vitals/ Pain       Pain Assessment: Faces Faces Pain Scale:  Hurts even more Pain Location: back pain and LLE with movement Pain Descriptors / Indicators: Constant;Grimacing;Discomfort;Moaning Pain Intervention(s): Limited activity within patient's tolerance;Repositioned  Home Living                                          Prior Functioning/Environment              Frequency  Min 2X/week        Progress Toward Goals  OT Goals(current goals can now be found in the care plan section)  Progress towards OT goals: Progressing toward goals  Acute Rehab OT Goals Patient Stated Goal: pt unable to state OT Goal Formulation: Patient unable to participate in goal setting ADL Goals Pt Will Perform Eating: with min assist;sitting (with Mod verbal and visual cues) Pt Will Perform Grooming: with min assist;sitting (with Mod verbal and visual cues) Pt Will Transfer to Toilet: with mod assist;stand pivot transfer;bedside commode  Plan Discharge plan remains appropriate    Co-evaluation    PT/OT/SLP Co-Evaluation/Treatment: Yes Reason for Co-Treatment: Complexity of the patient's impairments (multi-system involvement);For patient/therapist safety;To address functional/ADL transfers PT goals addressed during session: Mobility/safety with mobility OT goals addressed during session: ADL's and self-care      AM-PAC PT "6 Clicks" Daily Activity     Outcome Measure   Help from another person eating meals?: A Lot Help from another person taking care of personal grooming?: A Lot Help from another person toileting, which includes using toliet, bedpan, or urinal?: A Lot Help from another person bathing (including washing, rinsing, drying)?: A Lot Help from another person to put on and taking off regular upper body clothing?: A Lot Help from another person to put on and taking off regular lower body clothing?: Total 6 Click Score: 11    End of Session Equipment Utilized During Treatment: Gait belt;Oxygen  OT Visit Diagnosis:  Unsteadiness on feet (R26.81);Muscle weakness (generalized) (M62.81);History of falling (Z91.81);Pain;Other symptoms and signs involving cognitive function;Feeding difficulties (R63.3) Pain - Right/Left: Left Pain - part of body: Knee   Activity Tolerance Patient limited by pain;Other (comment) (Limited by cogntition)   Patient Left in chair;with call bell/phone within reach;with chair alarm set   Nurse Communication Mobility status;Other (comment);Weight bearing status (Pt's complaint of back pain)        Time: 4193-7902 OT Time Calculation (min): 31 min  Charges: OT General Charges $OT Visit: 1 Procedure OT Treatments $Self Care/Home Management : 8-22 mins  [place], OTR/L [phone]   [place] 07/27/2016, 9:56 AM

## 2016-07-28 DIAGNOSIS — N184 Chronic kidney disease, stage 4 (severe): Secondary | ICD-10-CM

## 2016-07-28 DIAGNOSIS — F320 Major depressive disorder, single episode, mild: Secondary | ICD-10-CM

## 2016-07-28 DIAGNOSIS — I10 Essential (primary) hypertension: Secondary | ICD-10-CM

## 2016-07-28 DIAGNOSIS — D72829 Elevated white blood cell count, unspecified: Secondary | ICD-10-CM

## 2016-07-28 DIAGNOSIS — W19XXXA Unspecified fall, initial encounter: Secondary | ICD-10-CM

## 2016-07-28 DIAGNOSIS — S72002S Fracture of unspecified part of neck of left femur, sequela: Secondary | ICD-10-CM

## 2016-07-28 DIAGNOSIS — Y92129 Unspecified place in nursing home as the place of occurrence of the external cause: Secondary | ICD-10-CM

## 2016-07-28 LAB — TYPE AND SCREEN
ABO/RH(D): A POS
ANTIBODY SCREEN: NEGATIVE
UNIT DIVISION: 0
UNIT DIVISION: 0
Unit division: 0

## 2016-07-28 LAB — CBC AND DIFFERENTIAL
HEMATOCRIT: 27 % — AB (ref 41–53)
HEMOGLOBIN: 8.7 g/dL — AB (ref 13.5–17.5)
PLATELETS: 151 10*3/uL (ref 150–399)
WBC: 15 10^3/mL

## 2016-07-28 LAB — BPAM RBC
Blood Product Expiration Date: 201806012359
Blood Product Expiration Date: 201806062359
Blood Product Expiration Date: 201806122359
ISSUE DATE / TIME: 201805271829
ISSUE DATE / TIME: 201805281112
ISSUE DATE / TIME: 201805291128
UNIT TYPE AND RH: 600
UNIT TYPE AND RH: 6200
Unit Type and Rh: 6200

## 2016-07-28 LAB — CBC
HCT: 26.9 % — ABNORMAL LOW (ref 39.0–52.0)
Hemoglobin: 8.7 g/dL — ABNORMAL LOW (ref 13.0–17.0)
MCH: 29.1 pg (ref 26.0–34.0)
MCHC: 32.3 g/dL (ref 30.0–36.0)
MCV: 90 fL (ref 78.0–100.0)
PLATELETS: 151 10*3/uL (ref 150–400)
RBC: 2.99 MIL/uL — ABNORMAL LOW (ref 4.22–5.81)
RDW: 17.5 % — AB (ref 11.5–15.5)
WBC: 15 10*3/uL — ABNORMAL HIGH (ref 4.0–10.5)

## 2016-07-28 LAB — BASIC METABOLIC PANEL
Anion gap: 6 (ref 5–15)
BUN: 61 mg/dL — AB (ref 4–21)
BUN: 61 mg/dL — AB (ref 6–20)
CO2: 21 mmol/L — ABNORMAL LOW (ref 22–32)
CREATININE: 3.5 mg/dL — AB (ref 0.6–1.3)
CREATININE: 3.52 mg/dL — AB (ref 0.61–1.24)
Calcium: 7.7 mg/dL — ABNORMAL LOW (ref 8.9–10.3)
Chloride: 116 mmol/L — ABNORMAL HIGH (ref 101–111)
GFR calc Af Amer: 16 mL/min — ABNORMAL LOW (ref >60)
GFR, EST NON AFRICAN AMERICAN: 14 mL/min — AB (ref >60)
Glucose, Bld: 80 mg/dL (ref 65–99)
Glucose: 80 mg/dL
POTASSIUM: 4.1 mmol/L (ref 3.5–5.1)
Potassium: 4.1 mmol/L (ref 3.4–5.3)
Sodium: 143 mmol/L (ref 135–145)
Sodium: 143 mmol/L (ref 137–147)

## 2016-07-28 NOTE — Progress Notes (Signed)
Nephew notified that hearing aid was located in pt bed.

## 2016-07-28 NOTE — Progress Notes (Signed)
PROGRESS NOTE    Vincent Potts  IEP:[id number] DOB: Feb 04, 1925 DOA: 07/24/2016 PCP: Estill Dooms, MD    Brief Narrative:  83 y.o.malewith medical history significant of dementia, hypertension, BPH, gait disorder, chronic kidney disease stage IV who was brought to the ED from his skilled nursing facility after he sustained a mechanical fall and left hip Fracture, went for surgical repair 5/26 by Dr. [name].Patient with postoperative anemia, requiring 2 units PRBC transfusions so far.   Assessment & Plan:   Principal Problem:   Hip fracture ([place]) Active Problems:   Hypertension   Hearing loss   Depression   Dementia with behavioral disturbance   BPH (benign prostatic hyperplasia)   CKD (chronic kidney disease) stage 4, GFR 15-29 ml/min (HCC)   Anemia in chronic kidney disease   Adult failure to thrive  Left hip fracture: - Secondary to mechanical fall, s/p surgical repair 5/26 by Dr. [name], continue with when necessary pain and nausea medication, DVT prophylaxis per ortho.Seen by PT, will need SNF placement  Acute on chronic kidney disease stage IV: -  baseline creatinine 2.5-3.5, was 4.8 on admission, Renal function improving back to baseline, avoid nephrotoxic medication , continue with gentle hydration .  Hypertension:  - Blood pressure stable. Continue current regimen of Norvasc.   Postoperative Acute blood loss anemia  - Patient with baseline anemia of chronic kidney disease , baseline hemoglobin around 9, with postoperative anemia with hemoglobin dropping as low as 7.5 from 10.4 on admission. Patient status post 2 units packed red blood cells 1 unit given on 07/25/2016 and out of the unit given 07/27/2016. Hemoglobin currently at 8.7. Follow.  BPH - Continue Flomax and finasteride  Dementia/depression: -  Very hard of hearing-at times not able to answer simple questions. Patient also noted not to have his hearing needs. Continue Namenda, Remeron  Adult  failure to thrive syndrome: - Will need PT, nutritional consult.  Leukocytosis - Blood cell count significantly elevated after surgery, was 25, unclear etiology, no diarrhea, no abdominal pain, no fever, no chills, he is nontoxic appearing, negative urinalysis, chest x-ray is negative, trending down and currently at 15. Patient is afebrile. Follow.     DVT prophylaxis: SCDs with aspirin twice a day per orthopedics Code Status: DO NOT RESUSCITATE Family Communication: Updated patient. No family at bedside. Disposition Plan: SNF when medically stable.   Consultants:   Orthopedics: Dr. [name] 07/24/2016  Procedures:   CXR 07/26/2016, 07/24/2016  X-ray of the left knee 07/24/2016  Plain. The left hip and pelvis 07/24/2016 Treatment of intertrochanteric, pertrochanteric, subtrochanteric fracture with intramedullary implant--07/24/2016 2 units packed red blood cells  Antimicrobials:   None    Subjective: Patient sleeping easily arousable. No complaints.  Objective: Vitals:   07/27/16 1300 07/27/16 1520 07/27/16 2027 07/28/16 0420  BP: (!) 161/67 138/68 (!) 147/62 138/63  Pulse: 62 64 74 65  Resp: 16 18 16 20   Temp: 98.4 F (36.9 C) 98.3 F (36.8 C) 98.2 F (36.8 C) 98.5 F (36.9 C)  TempSrc: Oral Oral Oral Axillary  SpO2: 98% 100% 99% 98%  Weight:      Height:        Intake/Output Summary (Last 24 hours) at 07/28/16 1305 Last data filed at 07/28/16 0420  Gross per 24 hour  Intake              674 ml  Output              850 ml  Net             -  176 ml   Filed Weights   07/24/16 0352  Weight: 59 kg (130 lb)    Examination:  General exam: Appears calm and comfortable  Respiratory system: Clear to auscultation anterior lung fields. Respiratory effort normal. Cardiovascular system: S1 & S2 heard, RRR. No JVD, murmurs, rubs, gallops or clicks. No pedal edema. Gastrointestinal system: Abdomen is nondistended, soft and nontender. No organomegaly or masses  felt. Normal bowel sounds heard. Central nervous system: Alert and oriented. No focal neurological deficits. Extremities: Symmetric 5 x 5 power. Skin: No rashes, lesions or ulcers Psychiatry: Judgement and insight appear normal. Mood & affect appropriate.     Data Reviewed: I have personally reviewed following labs and imaging studies  CBC:  Recent Labs Lab 07/24/16 0526 07/25/16 0849 07/26/16 0539 07/27/16 0552 07/28/16 0532  WBC 10.2 9.6 25.0* 17.6* 15.0*  NEUTROABS 8.8*  --   --  15.0*  --   HGB 10.4* 7.5* 7.6* 7.6* 8.7*  HCT 32.1* 23.9* 23.7* 23.5* 26.9*  MCV 91.5 92.6 91.9 91.1 90.0  PLT 230 159 146* 141* 627   Basic Metabolic Panel:  Recent Labs Lab 07/24/16 0526 07/25/16 0849 07/26/16 0539 07/27/16 0552 07/28/16 0532  NA 138 143 143 144 143  K 4.7 4.7 5.2* 4.5 4.1  CL 101 113* 113* 117* 116*  CO2 27 21* 22 20* 21*  GLUCOSE 93 83 99 87 80  BUN 72* 60* 61* 65* 61*  CREATININE 4.89* 4.31* 4.08* 4.03* 3.52*  CALCIUM 8.7* 7.5* 7.5* 7.5* 7.7*   GFR: Estimated Creatinine Clearance: 11.6 mL/min (A) (by C-G formula based on SCr of 3.52 mg/dL (H)). Liver Function Tests:  Recent Labs Lab 07/22/16 07/24/16 0526  AST 11* 20  ALT 9* 21  ALKPHOS 84 95  BILITOT  --  0.5  PROT  --  8.0  ALBUMIN  --  4.1   No results for input(s): LIPASE, AMYLASE in the last 168 hours. No results for input(s): AMMONIA in the last 168 hours. Coagulation Profile:  Recent Labs Lab 07/24/16 0526  INR 1.13   Cardiac Enzymes: No results for input(s): CKTOTAL, CKMB, CKMBINDEX, TROPONINI in the last 168 hours. BNP (last 3 results) No results for input(s): PROBNP in the last 8760 hours. HbA1C: No results for input(s): HGBA1C in the last 72 hours. CBG: No results for input(s): GLUCAP in the last 168 hours. Lipid Profile: No results for input(s): CHOL, HDL, LDLCALC, TRIG, CHOLHDL, LDLDIRECT in the last 72 hours. Thyroid Function Tests: No results for input(s): TSH, T4TOTAL,  FREET4, T3FREE, THYROIDAB in the last 72 hours. Anemia Panel: No results for input(s): VITAMINB12, FOLATE, FERRITIN, TIBC, IRON, RETICCTPCT in the last 72 hours. Sepsis Labs: No results for input(s): PROCALCITON, LATICACIDVEN in the last 168 hours.  Recent Results (from the past 240 hour(s))  Surgical pcr screen     Status: None   Collection Time: 07/24/16  9:53 AM  Result Value Ref Range Status   MRSA, PCR NEGATIVE NEGATIVE Final   Staphylococcus aureus NEGATIVE NEGATIVE Final    Comment:        The Xpert SA Assay (FDA approved for NASAL specimens in patients over 52 years of age), is one component of a comprehensive surveillance program.  Test performance has been validated by [facility] for patients greater than or equal to 9 year old. It is not intended to diagnose infection nor to guide or monitor treatment.          Radiology Studies: No results found.  Scheduled Meds: . amLODipine  5 mg Oral Daily  . cholecalciferol  1,000 Units Oral Daily  . feeding supplement  1 Container Oral TID WC  . feeding supplement (ENSURE ENLIVE)  237 mL Oral BID BM  . finasteride  5 mg Oral Daily  . fluticasone  1 spray Each Nare Daily  . loteprednol  1 drop Left Eye Daily  . memantine  28 mg Oral Daily  . mirtazapine  30 mg Oral QHS  . polyvinyl alcohol  1 drop Both Eyes TID  . senna  1 tablet Oral BID  . sodium chloride  1 drop Left Eye QHS  . tamsulosin  0.4 mg Oral q morning - 10a  . venlafaxine XR  75 mg Oral Q breakfast   Continuous Infusions: . sodium chloride 75 mL/hr at 07/28/16 1215  . methocarbamol (ROBAXIN)  IV       LOS: 4 days    Time spent: 51 minutes    Lilah Mijangos, MD [facility] Pager [phone]  If 7PM-7AM, please contact night-coverage [email or web address] Password [email or web address] 07/28/2016, 1:05 PM

## 2016-07-28 NOTE — NC FL2 (Signed)
[place] LEVEL OF CARE SCREENING TOOL     IDENTIFICATION  Patient Name: Vincent Potts Birthdate: [birth year] Sex: male Admission Date (Current Location): 07/24/2016  [facility] and [place] Number:  Herbalist and Address:  The . [facility], [place] [address], [place], [place] [address]      Provider Number: [id number]  Attending Physician Name and Address:  Eugenie Filler, MD  Relative Name and Phone Number:       Current Level of Care: Hospital Recommended Level of Care: [place] Prior Approval Number:    Date Approved/Denied: 07/28/16 PASRR Number: [id number]  Discharge Plan: SNF    Current Diagnoses: Patient Active Problem List   Diagnosis Date Noted  . Hip fracture ([place]) 07/24/2016  . Adult failure to thrive 07/20/2016  . Otitis externa 06/15/2016  . Blepharitis of both eyes 06/11/2016  . Epistaxis 05/11/2016  . Loss of weight 02/10/2016  . UTI (urinary tract infection) 12/16/2015  . Anemia in chronic kidney disease 06/12/2015  . AK (actinic keratosis) 03/19/2014  . Sinusitis, chronic 01/08/2014  . Insomnia 06/08/2013  . CKD (chronic kidney disease) stage 4, GFR 15-29 ml/min (HCC) 08/29/2012  . Depression 06/06/2012  . Dementia with behavioral disturbance 06/06/2012  . Constipation 06/06/2012  . BPH (benign prostatic hyperplasia) 06/06/2012  . Nephrolithiasis 10/25/2011  . Hyponatremia 10/24/2011  . Fall 04/19/2011  . Parkinsonism ([place])   . CVA (cerebral infarction)   . Hypertension   . Back pain   . Hearing loss   . Gait disorder   . Diverticulosis   . GERD (gastroesophageal reflux disease)     Orientation RESPIRATION BLADDER Height & Weight     Self  O2 (Nasal Cannula 2L) Incontinent Weight: 130 lb (59 kg) Height:  5\' 5"  (165.1 cm)  BEHAVIORAL SYMPTOMS/MOOD NEUROLOGICAL BOWEL NUTRITION STATUS      Continent Diet (See DC Summary)  AMBULATORY STATUS COMMUNICATION OF NEEDS Skin   Extensive  Assist Verbally                         Personal Care Assistance Level of Assistance  Bathing, Feeding, Dressing Bathing Assistance: Maximum assistance Feeding assistance: Limited assistance Dressing Assistance: Maximum assistance     Functional Limitations Info  Sight, Hearing, Speech Sight Info: Adequate Hearing Info: Adequate Speech Info: Adequate    SPECIAL CARE FACTORS FREQUENCY  PT (By licensed PT), OT (By licensed OT)     PT Frequency: 5xweek OT Frequency: 5xweek            Contractures      Additional Factors Info  Code Status, Allergies Code Status Info: DNR Allergies Info: OLANZAPINE, RIVASTIGMINE            Current Medications (07/28/2016):  This is the current hospital active medication list Current Facility-Administered Medications  Medication Dose Route Frequency Provider Last Rate Last Dose  . 0.9 %  sodium chloride infusion   Intravenous Continuous Eugenie Filler, MD 75 mL/hr at 07/28/16 1215    . acetaminophen (TYLENOL) tablet 650 mg  650 mg Oral Q6H PRN Nicholes Stairs, MD   650 mg at 07/27/16 2135   Or  . acetaminophen (TYLENOL) suppository 650 mg  650 mg Rectal Q6H PRN Nicholes Stairs, MD      . amLODipine [facility]) tablet 5 mg  5 mg Oral Daily Jonetta Osgood, MD   5 mg at 07/28/16 0258  . bisacodyl (DULCOLAX)  EC tablet 5 mg  5 mg Oral Daily PRN Jonetta Osgood, MD      . cholecalciferol (VITAMIN D) tablet 1,000 Units  1,000 Units Oral Daily Jonetta Osgood, MD   1,000 Units at 07/28/16 [phone]  . feeding supplement (BOOST / RESOURCE BREEZE) liquid 1 Container  1 Container Oral TID WC Jonetta Osgood, MD   1 Container at 07/28/16 [phone]  . feeding supplement (ENSURE ENLIVE) (ENSURE ENLIVE) liquid 237 mL  237 mL Oral BID BM Elgergawy, Silver Huguenin, MD   237 mL at 07/28/16 0937  . finasteride (PROSCAR) tablet 5 mg  5 mg Oral Daily Jonetta Osgood, MD   5 mg at 07/28/16 5732  . fluticasone (FLONASE) 50 MCG/ACT nasal spray  1 spray  1 spray Each Nare Daily Jonetta Osgood, MD   1 spray at 07/28/16 0944  . HYDROcodone-acetaminophen (NORCO/VICODIN) 5-325 MG per tablet 1-2 tablet  1-2 tablet Oral Q6H PRN Jonetta Osgood, MD   2 tablet at 07/25/16 2235  . loteprednol (LOTEMAX) 0.5 % ophthalmic suspension 1 drop  1 drop Left Eye Daily Jonetta Osgood, MD   1 drop at 07/28/16 0949  . memantine (NAMENDA XR) 24 hr capsule 28 mg  28 mg Oral Daily Jonetta Osgood, MD   28 mg at 07/28/16 2025  . methocarbamol (ROBAXIN) tablet 500 mg  500 mg Oral Q6H PRN Jonetta Osgood, MD   500 mg at 07/26/16 1124   Or  . methocarbamol (ROBAXIN) 500 mg in dextrose 5 % 50 mL IVPB  500 mg Intravenous Q6H PRN Ghimire, Henreitta Leber, MD      . metoCLOPramide (REGLAN) tablet 5-10 mg  5-10 mg Oral Q8H PRN Nicholes Stairs, MD       Or  . metoCLOPramide [facility]) injection 5-10 mg  5-10 mg Intravenous Q8H PRN Nicholes Stairs, MD      . mirtazapine (REMERON) tablet 30 mg  30 mg Oral QHS Jonetta Osgood, MD   30 mg at 07/27/16 2135  . morphine 4 MG/ML injection 0.52 mg  0.52 mg Intravenous Q2H PRN Jonetta Osgood, MD      . ondansetron [facility]) tablet 4 mg  4 mg Oral Q6H PRN Nicholes Stairs, MD       Or  . ondansetron [facility]) injection 4 mg  4 mg Intravenous Q6H PRN Nicholes Stairs, MD      . polyethylene glycol (MIRALAX / GLYCOLAX) packet 17 g  17 g Oral Daily PRN Ghimire, Henreitta Leber, MD      . polyvinyl alcohol (LIQUIFILM TEARS) 1.4 % ophthalmic solution 1 drop  1 drop Both Eyes TID Jonetta Osgood, MD   1 drop at 07/28/16 0945  . senna (SENOKOT) tablet 8.6 mg  1 tablet Oral BID Jonetta Osgood, MD   8.6 mg at 07/28/16 4270  . sodium chloride (MURO 128) 5 % ophthalmic solution 1 drop  1 drop Left Eye QHS Elgergawy, Silver Huguenin, MD   1 drop at 07/27/16 2142  . tamsulosin (FLOMAX) capsule 0.4 mg  0.4 mg Oral q morning - 10a Ghimire, Henreitta Leber, MD   0.4 mg at 07/28/16 6237  . venlafaxine XR (EFFEXOR-XR) 24 hr  capsule 75 mg  75 mg Oral Q breakfast Jonetta Osgood, MD   75 mg at 07/28/16 6283     Discharge Medications: Please see discharge summary for a list of discharge medications.  Relevant Imaging Results:  Relevant Lab  Results:   Additional Information [id number]  Normajean Baxter, LCSW

## 2016-07-28 NOTE — Progress Notes (Signed)
Subjective: 4 Days Post-Op Procedure(s) (LRB): INTRAMEDULLARY (IM) NAIL INTERTROCHANTRIC (Left) Patient sleeping comfortably in bed. Arousable, at his baseline level of confusion. No family members in the room at this time. Per nursing he had a Foley catheter reinserted following for performance from the condom catheter.   Objective: Vital signs in last 24 hours: Temp:  [98.1 F (36.7 C)-98.5 F (36.9 C)] 98.1 F (36.7 C) (05/30 1300) Pulse Rate:  [64-74] 64 (05/30 1300) Resp:  [16-20] 20 (05/30 1300) BP: (138-147)/(62-68) 143/68 (05/30 1300) SpO2:  [98 %-99 %] 98 % (05/30 1300)  Intake/Output from previous day: 05/29 0701 - 05/30 0700 In: 1364 [P.O.:680; I.V.:250; Blood:434] Out: 850 [Urine:850] Intake/Output this shift: No intake/output data recorded.   Recent Labs  07/26/16 0539 07/27/16 0552 07/28/16 0532  HGB 7.6* 7.6* 8.7*    Recent Labs  07/27/16 0552 07/28/16 0532  WBC 17.6* 15.0*  RBC 2.58* 2.99*  HCT 23.5* 26.9*  PLT 141* 151    Recent Labs  07/27/16 0552 07/28/16 0532  NA 144 143  K 4.5 4.1  CL 117* 116*  CO2 20* 21*  BUN 65* 61*  CREATININE 4.03* 3.52*  GLUCOSE 87 80  CALCIUM 7.5* 7.7*   No results for input(s): LABPT, INR in the last 72 hours. LLE- Incision: dressing C/D/I No cellulitis present Compartment soft    Assessment/Plan: 4 Days Post-Op Procedure(s) (LRB): INTRAMEDULLARY (IM) NAIL INTERTROCHANTRIC (Left) Up with therapy   Acute blood loss anemia managed per the medicine team. He is now status post 2 units of packed red blood cells with his current hemoglobin of 8.7 near his baseline of 9.   Management of acute on chronic medical comorbidities per the medicine team.  50% WB on LLE Dressing changes PRN, keep wound dry until follow up Bid asa with SCDs for DVT ppx Return to see [name] in 2 weeks for wound check  Nicholes Stairs 07/28/2016, 4:02 PM

## 2016-07-28 NOTE — Progress Notes (Signed)
Family inquiring about hearing aid. There was no mention of hearing aid during report. RN and NT searched patient's room, but was unable to find hearing aid.

## 2016-07-29 ENCOUNTER — Encounter (HOSPITAL_COMMUNITY): Payer: Self-pay | Admitting: Orthopedic Surgery

## 2016-07-29 DIAGNOSIS — R29898 Other symptoms and signs involving the musculoskeletal system: Secondary | ICD-10-CM | POA: Diagnosis not present

## 2016-07-29 DIAGNOSIS — M810 Age-related osteoporosis without current pathological fracture: Secondary | ICD-10-CM | POA: Diagnosis not present

## 2016-07-29 DIAGNOSIS — N400 Enlarged prostate without lower urinary tract symptoms: Secondary | ICD-10-CM | POA: Diagnosis not present

## 2016-07-29 DIAGNOSIS — F320 Major depressive disorder, single episode, mild: Secondary | ICD-10-CM | POA: Diagnosis not present

## 2016-07-29 DIAGNOSIS — Z7189 Other specified counseling: Secondary | ICD-10-CM | POA: Diagnosis not present

## 2016-07-29 DIAGNOSIS — E871 Hypo-osmolality and hyponatremia: Secondary | ICD-10-CM | POA: Diagnosis not present

## 2016-07-29 DIAGNOSIS — M79662 Pain in left lower leg: Secondary | ICD-10-CM | POA: Diagnosis not present

## 2016-07-29 DIAGNOSIS — R4182 Altered mental status, unspecified: Secondary | ICD-10-CM | POA: Diagnosis not present

## 2016-07-29 DIAGNOSIS — R627 Adult failure to thrive: Secondary | ICD-10-CM | POA: Diagnosis not present

## 2016-07-29 DIAGNOSIS — E43 Unspecified severe protein-calorie malnutrition: Secondary | ICD-10-CM | POA: Diagnosis not present

## 2016-07-29 DIAGNOSIS — S51812A Laceration without foreign body of left forearm, initial encounter: Secondary | ICD-10-CM | POA: Diagnosis not present

## 2016-07-29 DIAGNOSIS — F0390 Unspecified dementia without behavioral disturbance: Secondary | ICD-10-CM | POA: Diagnosis not present

## 2016-07-29 DIAGNOSIS — M25552 Pain in left hip: Secondary | ICD-10-CM | POA: Diagnosis not present

## 2016-07-29 DIAGNOSIS — F321 Major depressive disorder, single episode, moderate: Secondary | ICD-10-CM | POA: Diagnosis not present

## 2016-07-29 DIAGNOSIS — R262 Difficulty in walking, not elsewhere classified: Secondary | ICD-10-CM | POA: Diagnosis not present

## 2016-07-29 DIAGNOSIS — E860 Dehydration: Secondary | ICD-10-CM | POA: Diagnosis not present

## 2016-07-29 DIAGNOSIS — M79632 Pain in left forearm: Secondary | ICD-10-CM | POA: Diagnosis not present

## 2016-07-29 DIAGNOSIS — R638 Other symptoms and signs concerning food and fluid intake: Secondary | ICD-10-CM | POA: Diagnosis not present

## 2016-07-29 DIAGNOSIS — M5440 Lumbago with sciatica, unspecified side: Secondary | ICD-10-CM | POA: Diagnosis not present

## 2016-07-29 DIAGNOSIS — Z4789 Encounter for other orthopedic aftercare: Secondary | ICD-10-CM | POA: Diagnosis not present

## 2016-07-29 DIAGNOSIS — W19XXXA Unspecified fall, initial encounter: Secondary | ICD-10-CM | POA: Diagnosis not present

## 2016-07-29 DIAGNOSIS — M79652 Pain in left thigh: Secondary | ICD-10-CM | POA: Diagnosis not present

## 2016-07-29 DIAGNOSIS — R2681 Unsteadiness on feet: Secondary | ICD-10-CM | POA: Diagnosis not present

## 2016-07-29 DIAGNOSIS — D631 Anemia in chronic kidney disease: Secondary | ICD-10-CM | POA: Diagnosis not present

## 2016-07-29 DIAGNOSIS — S72009A Fracture of unspecified part of neck of unspecified femur, initial encounter for closed fracture: Secondary | ICD-10-CM | POA: Diagnosis not present

## 2016-07-29 DIAGNOSIS — D62 Acute posthemorrhagic anemia: Secondary | ICD-10-CM | POA: Diagnosis not present

## 2016-07-29 DIAGNOSIS — W19XXXD Unspecified fall, subsequent encounter: Secondary | ICD-10-CM | POA: Diagnosis not present

## 2016-07-29 DIAGNOSIS — N184 Chronic kidney disease, stage 4 (severe): Secondary | ICD-10-CM | POA: Diagnosis not present

## 2016-07-29 DIAGNOSIS — S72002S Fracture of unspecified part of neck of left femur, sequela: Secondary | ICD-10-CM | POA: Diagnosis not present

## 2016-07-29 DIAGNOSIS — K5900 Constipation, unspecified: Secondary | ICD-10-CM | POA: Diagnosis not present

## 2016-07-29 DIAGNOSIS — Z79899 Other long term (current) drug therapy: Secondary | ICD-10-CM | POA: Diagnosis not present

## 2016-07-29 DIAGNOSIS — D72829 Elevated white blood cell count, unspecified: Secondary | ICD-10-CM | POA: Diagnosis not present

## 2016-07-29 DIAGNOSIS — R7989 Other specified abnormal findings of blood chemistry: Secondary | ICD-10-CM | POA: Diagnosis not present

## 2016-07-29 DIAGNOSIS — N179 Acute kidney failure, unspecified: Secondary | ICD-10-CM | POA: Diagnosis not present

## 2016-07-29 DIAGNOSIS — S51812D Laceration without foreign body of left forearm, subsequent encounter: Secondary | ICD-10-CM | POA: Diagnosis not present

## 2016-07-29 DIAGNOSIS — E875 Hyperkalemia: Secondary | ICD-10-CM | POA: Diagnosis not present

## 2016-07-29 DIAGNOSIS — Y92129 Unspecified place in nursing home as the place of occurrence of the external cause: Secondary | ICD-10-CM | POA: Diagnosis not present

## 2016-07-29 DIAGNOSIS — E440 Moderate protein-calorie malnutrition: Secondary | ICD-10-CM | POA: Diagnosis not present

## 2016-07-29 DIAGNOSIS — R634 Abnormal weight loss: Secondary | ICD-10-CM | POA: Diagnosis not present

## 2016-07-29 DIAGNOSIS — K219 Gastro-esophageal reflux disease without esophagitis: Secondary | ICD-10-CM | POA: Diagnosis not present

## 2016-07-29 DIAGNOSIS — I10 Essential (primary) hypertension: Secondary | ICD-10-CM | POA: Diagnosis not present

## 2016-07-29 DIAGNOSIS — M25551 Pain in right hip: Secondary | ICD-10-CM | POA: Diagnosis not present

## 2016-07-29 DIAGNOSIS — F0151 Vascular dementia with behavioral disturbance: Secondary | ICD-10-CM | POA: Diagnosis not present

## 2016-07-29 DIAGNOSIS — M6281 Muscle weakness (generalized): Secondary | ICD-10-CM | POA: Diagnosis not present

## 2016-07-29 LAB — BASIC METABOLIC PANEL
Anion gap: 7 (ref 5–15)
BUN: 57 mg/dL — AB (ref 4–21)
BUN: 57 mg/dL — ABNORMAL HIGH (ref 6–20)
CO2: 19 mmol/L — ABNORMAL LOW (ref 22–32)
CREATININE: 3.1 mg/dL — AB (ref 0.6–1.3)
Calcium: 7.8 mg/dL — ABNORMAL LOW (ref 8.9–10.3)
Chloride: 119 mmol/L — ABNORMAL HIGH (ref 101–111)
Creatinine, Ser: 3.09 mg/dL — ABNORMAL HIGH (ref 0.61–1.24)
GFR calc Af Amer: 19 mL/min — ABNORMAL LOW (ref >60)
GFR calc non Af Amer: 16 mL/min — ABNORMAL LOW (ref >60)
Glucose, Bld: 76 mg/dL (ref 65–99)
Glucose: 76 mg/dL
Potassium: 4.2 mmol/L (ref 3.5–5.1)
SODIUM: 145 mmol/L (ref 137–147)
Sodium: 145 mmol/L (ref 135–145)

## 2016-07-29 LAB — CBC WITH DIFFERENTIAL/PLATELET
Basophils Absolute: 0 10*3/uL (ref 0.0–0.1)
Basophils Relative: 0 %
Eosinophils Absolute: 0.3 10*3/uL (ref 0.0–0.7)
Eosinophils Relative: 4 %
HCT: 27.7 % — ABNORMAL LOW (ref 39.0–52.0)
Hemoglobin: 9 g/dL — ABNORMAL LOW (ref 13.0–17.0)
Lymphocytes Relative: 18 %
Lymphs Abs: 1.8 10*3/uL (ref 0.7–4.0)
MCH: 29.3 pg (ref 26.0–34.0)
MCHC: 32.5 g/dL (ref 30.0–36.0)
MCV: 90.2 fL (ref 78.0–100.0)
Monocytes Absolute: 0.6 10*3/uL (ref 0.1–1.0)
Monocytes Relative: 6 %
Neutro Abs: 7.1 10*3/uL (ref 1.7–7.7)
Neutrophils Relative %: 72 %
Platelets: 166 10*3/uL (ref 150–400)
RBC: 3.07 MIL/uL — ABNORMAL LOW (ref 4.22–5.81)
RDW: 17.1 % — ABNORMAL HIGH (ref 11.5–15.5)
WBC: 9.8 10*3/uL (ref 4.0–10.5)

## 2016-07-29 LAB — CBC AND DIFFERENTIAL: WBC: 9.8 10*3/mL

## 2016-07-29 MED ORDER — SODIUM CHLORIDE 0.45 % IV SOLN
INTRAVENOUS | Status: DC
  Administered 2016-07-29: 11:00:00 via INTRAVENOUS

## 2016-07-29 NOTE — Social Work (Signed)
Clinical Social Worker facilitated patient discharge including contacting patient family and facility to confirm patient discharge plans.  Clinical information faxed to facility and family agreeable with plan.  CSW arranged ambulance transport via [facility] to [facility].  RN to call [phone], [place] report prior to discharge.  Clinical Social Worker will sign off for now as social work intervention is no longer needed. Please consult [place] again if new need arises.  Elissa Hefty, LCSW Clinical Social Worker [phone]

## 2016-07-29 NOTE — Progress Notes (Signed)
Pt discharged to SNF, report given, transported via [facility] along with belongings including his pajama, robe, hearing aid and batteries, DNR sheet, no acute distress noted, patient's family is aware of discharge.

## 2016-07-29 NOTE — Discharge Summary (Signed)
Physician Discharge Summary  Vincent Potts IFO:[id number] DOB: Nov 12, 1924 DOA: 07/24/2016  PCP: Vincent Dooms, MD  Admit date: 07/24/2016 Discharge date: 07/29/2016  Time spent: 55 minutes  Recommendations for Outpatient Follow-up:  1. Follow-up with Vincent Potts orthopedics in 2 weeks. 2. Follow-up with M.D. at skilled nursing facility. Patient will need a basic metabolic profile and a CBC done in 1 week to follow-up on electrolytes, renal function, hemoglobin.   Discharge Diagnoses:  Principal Problem:   Hip fracture ([place]) Active Problems:   Hypertension   Hearing loss   Depression   Dementia with behavioral disturbance   BPH (benign prostatic hyperplasia)   CKD (chronic kidney disease) stage 4, GFR 15-29 ml/min (HCC)   Anemia in chronic kidney disease   Adult failure to thrive   Fall at nursing home   Leukocytosis   Discharge Condition: Stable and improved.  Diet recommendation: Regular  Filed Weights   07/24/16 0352  Weight: 59 kg (130 lb)    History of present illness:  Per Vincent Potts is a 83 y.o. male with medical history significant of dementia, hypertension, BPH, gait disorder, chronic kidney disease stage IV who was brought to the ED from his skilled nursing facility after he sustained a mechanical fall and left hip and knee pain. Patient is a very poor historian-he has advanced dementia and also has significant hearing issues-most of this history is obtained after reviewing ED chart. Apparently patient fell sometime 1 day prior to admission-it was a unwitnessed fall-subsequently patient started complaining of pain in his knee and his left hip. He was brought to the emergency room, where a x-ray showed a left hip fracture. Orthopedics (Vincent Potts) was consulted by the emergency department physician, recommendations were to transfer to [facility] for hip repair by Vincent Potts.  Unable to obtain any further history at this time  Note: Lives at:  SNF Mobility: Cane/Walker Chronic Indwelling Foley:no  Hospital Course:  Left hip fracture: - Secondary to mechanical fall, s/p surgical repair 5/26 by Vincent Potts, patient was placed on when necessary pain and nausea medication. Patient improved clinically. Patient was seen by physical therapy. Patient be discharged to skilled nursing facility [facility] on aspirin twice a day for DVT prophylaxis per orthopedics. Patient will follow-up with Vincent Potts, orthopedics in 2 weeks.  Acute on chronic kidney disease stage IV: - baseline creatinine 2.5-3.5, was 4.8 on admission, was felt likely to be a prerenal azotemia in the setting of recently started Bactrim. Bactrim was discontinued. Patient was hydrated with IV fluids and nephrotoxic agents avoided. Patient's renal function improved such that by day of discharge patient's creatinine was down to 3.09. Outpatient follow-up.  Hypertension:  - Blood pressure stable. Patient was maintained on Norvasc throughout the hospitalization.  Postoperative Acute blood loss anemia  - Patient with baseline anemia of chronic kidney disease , baseline hemoglobin around 9, with postoperative anemia with hemoglobin dropping as low as 7.5 from 10.4 on admission. Patient status post 2 units packed red blood cells, 1 unit given on 07/25/2016 and 2nd unit given 07/27/2016. Hemoglobin stabilized at 9.0 on day of discharge. Outpatient follow-up.   BPH - Patient maintained on home regimen of Flomax and finasteride  Dementia/depression: - Very hard of hearing-at times not able to answer simple questions. Patient also noted not to have his hearing aids. Continued on Namenda, Remeron  Adult failure to thrive syndrome: - Will need PT, nutritional consult.  Leukocytosis - Blood cell  count significantly elevated after surgery, was 25, unclear etiology, no diarrhea, no abdominal pain, no fever, no chills, he is nontoxic appearing, negative urinalysis, chest  x-ray was negative, trending down and had resolved by day of discharge.      Procedures:  CXR 07/26/2016, 07/24/2016  X-ray of the left knee 07/24/2016  Plain. The left hip and pelvis 07/24/2016 Treatment of intertrochanteric, pertrochanteric, subtrochanteric fracture with intramedullary implant--07/24/2016 2 units packed red blood cells  Consultations:  Orthopedics: Vincent Potts 07/24/2016   Discharge Exam: Vitals:   07/29/16 0537 07/29/16 1300  BP: (!) 149/77 (!) 150/79  Pulse: 81 73  Resp:  18  Temp: 99.5 F (37.5 C) 98.5 F (36.9 C)    General: NAD Cardiovascular: RRR Respiratory: CTAB  Discharge Instructions   Discharge Instructions    Diet general    Complete by:  As directed    Increase activity slowly    Complete by:  As directed      Current Discharge Medication List    START taking these medications   Details  aspirin EC 81 MG tablet Take 1 tablet (81 mg total) by mouth 2 (two) times daily. Qty: 150 tablet, Refills: 2    HYDROcodone-acetaminophen (NORCO) 5-325 MG tablet Take 1-2 tablets by mouth every 6 (six) hours as needed for moderate pain. Qty: 40 tablet, Refills: 0      CONTINUE these medications which have NOT CHANGED   Details  !! acetaminophen (TYLENOL) 325 MG tablet Take 650 mg by mouth every 4 (four) hours as needed for mild pain or moderate pain.     !! acetaminophen (TYLENOL) 500 MG tablet Take 500 mg by mouth at bedtime.     amLODipine (NORVASC) 5 MG tablet Take 5 mg by mouth daily.     bisacodyl (DULCOLAX) 10 MG suppository Place 10 mg rectally daily as needed for moderate constipation.    Cholecalciferol (VITAMIN D-3) 1000 units CAPS Take 1,000 Units by mouth daily.     finasteride (PROSCAR) 5 MG tablet Take 5 mg by mouth daily.    fluticasone (FLONASE) 50 MCG/ACT nasal spray Place 1 spray into both nostrils daily.    loteprednol (LOTEMAX) 0.5 % ophthalmic suspension Place 1 drop into the left eye daily.    magnesium  hydroxide (MILK OF MAGNESIA) 400 MG/5ML suspension Take 30 mLs by mouth daily as needed for mild constipation.     Melatonin 3 MG TABS Take 3 mg by mouth at bedtime.     memantine (NAMENDA XR) 28 MG CP24 24 hr capsule Take 28 mg by mouth daily. Take one daily for memory    mirtazapine (REMERON) 30 MG tablet Take 30 mg by mouth at bedtime.    Nutritional Supplements (RESOURCE BREEZE PO) Take 120 mLs by mouth 4 (four) times daily. nutritional supplement    Omega-3 Fatty Acids (FISH OIL) 1000 MG CAPS Take 2,000 mg by mouth daily.     Polyethyl Glycol-Propyl Glycol (SYSTANE) 0.4-0.3 % SOLN Place 1 drop into both eyes 3 (three) times daily.    senna (SENOKOT) 8.6 MG tablet Take 2 tablets by mouth daily.     sodium chloride (MURO 128) 2 % ophthalmic solution Place 1 drop into the left eye 3 (three) times daily.     sodium chloride (MURO 128) 5 % ophthalmic ointment Place 1 application into the left eye at bedtime.     sodium fluoride (PREVIDENT 5000 PLUS) 1.1 % CREA dental cream Place 1 application onto teeth every evening.  Tamsulosin HCl (FLOMAX) 0.4 MG CAPS Take 0.4 mg by mouth every morning.     venlafaxine XR (EFFEXOR-XR) 75 MG 24 hr capsule Take 75 mg by mouth daily with breakfast.     !! - Potential duplicate medications found. Please discuss with provider.    STOP taking these medications     sulfamethoxazole-trimethoprim (BACTRIM DS,SEPTRA DS) 800-160 MG tablet        Allergies  Allergen Reactions  . Olanzapine Other (See Comments)    unknown  . Rivastigmine Nausea Only    Contact information for follow-up providers    Nicholes Stairs, MD. Schedule an appointment as soon as possible for a visit in 2 week(s).   Specialty:  Orthopedic Surgery Contact information: [address] [place] [address] [phone]            Contact information for after-discharge care    Destination    [facility] SNF/ALF Follow up.   Specialty:   [place] information: [phone] [address] [phone]                   The results of significant diagnostics from this hospitalization (including imaging, microbiology, ancillary and laboratory) are listed below for reference.    Significant Diagnostic Studies: Dg Chest 1 View  Result Date: 07/24/2016 CLINICAL DATA:  Left hip fracture.  History of hypertension. EXAM: CHEST 1 VIEW COMPARISON:  10/24/2011 FINDINGS: Shallow inspiration with linear atelectasis in the lung bases. Heart size and pulmonary vascularity are normal. No consolidation. No blunting of costophrenic angles. No pneumothorax. Calcified and tortuous aorta. Old bilateral rib fractures. IMPRESSION: Shallow inspiration with atelectasis in the lung bases. Electronically Signed   By: Lucienne Capers M.D.   On: 07/24/2016 04:55   Dg Chest Port 1 View  Result Date: 07/26/2016 CLINICAL DATA:  Leukocytosis. EXAM: PORTABLE CHEST 1 VIEW COMPARISON:  07/24/2016 FINDINGS: Cardiac silhouette is normal in size. Aortic atherosclerosis is noted. Lung volumes remain diminished with persistent mild atelectasis in the lung bases. No lobar consolidation, edema, sizable pleural effusion, or pneumothorax is identified. Multiple old rib fractures are again noted. IMPRESSION: Low lung volumes with mild bibasilar atelectasis. Electronically Signed   By: Logan Bores M.D.   On: 07/26/2016 08:52   Dg Knee Complete 4 Views Left  Result Date: 07/24/2016 CLINICAL DATA:  Knee swelling, pain. EXAM: LEFT KNEE - COMPLETE 4+ VIEW COMPARISON:  None. FINDINGS: No acute fracture deformity or dislocation. Severe osteopenia without destructive bony lesions. No advanced degenerative change for age. Severe vascular calcifications. Soft tissue planes are otherwise unremarkable. IMPRESSION: No acute osseous process. Osteopenia which decreases sensitivity for acute nondisplaced fractures. Electronically  Signed   By: Elon Alas M.D.   On: 07/24/2016 04:27   Dg C-arm 1-60 Min  Result Date: 07/24/2016 CLINICAL DATA:  Fracture repair in the left hip. FLUOROSCOPY TIME:  55.1 seconds. Images: 4 EXAM: DG C-ARM 61-120 MIN; OPERATIVE LEFT HIP WITH PELVIS COMPARISON:  Jul 24, 2016 FINDINGS: Rods and nails have been placed across the intertrochanteric fracture on the left. Hardware is in good position. There is a distal interlocking screw. IMPRESSION: Left hip fracture repair as above. Electronically Signed   By: Dorise Bullion III M.D   On: 07/24/2016 12:32   Dg Hip Operative Unilat W Or W/o Pelvis Left  Result Date: 07/24/2016 CLINICAL DATA:  Fracture repair in the left hip. FLUOROSCOPY TIME:  55.1 seconds. Images: 4 EXAM: DG  C-ARM 61-120 MIN; OPERATIVE LEFT HIP WITH PELVIS COMPARISON:  Jul 24, 2016 FINDINGS: Rods and nails have been placed across the intertrochanteric fracture on the left. Hardware is in good position. There is a distal interlocking screw. IMPRESSION: Left hip fracture repair as above. Electronically Signed   By: Dorise Bullion III M.D   On: 07/24/2016 12:32   Dg Hip Unilat With Pelvis 2-3 Views Left  Result Date: 07/24/2016 CLINICAL DATA:  Unwitnessed fall.  Left hip pain. EXAM: DG HIP (WITH OR WITHOUT PELVIS) 2-3V LEFT COMPARISON:  None. FINDINGS: There is an acute comminuted fracture of the inter trochanteric left hip with varus angulation. Displaced greater trochanteric fragment. No dislocation of the hip. Pelvis and right hip appear intact. Degenerative changes in the lower lumbar spine. Vascular calcifications. IMPRESSION: Acute comminuted inter trochanteric fracture of the left hip with varus angulation. Electronically Signed   By: Lucienne Capers M.D.   On: 07/24/2016 04:54    Microbiology: Recent Results (from the past 240 hour(s))  Surgical pcr screen     Status: None   Collection Time: 07/24/16  9:53 AM  Result Value Ref Range Status   MRSA, PCR NEGATIVE NEGATIVE  Final   Staphylococcus aureus NEGATIVE NEGATIVE Final    Comment:        The Xpert SA Assay (FDA approved for NASAL specimens in patients over 81 years of age), is one component of a comprehensive surveillance program.  Test performance has been validated by [facility] for patients greater than or equal to 39 year old. It is not intended to diagnose infection nor to guide or monitor treatment.      Labs: Basic Metabolic Panel:  Recent Labs Lab 07/25/16 0849 07/26/16 0539 07/27/16 0552 07/28/16 0532 07/29/16 0535  NA 143 143 144 143 145  K 4.7 5.2* 4.5 4.1 4.2  CL 113* 113* 117* 116* 119*  CO2 21* 22 20* 21* 19*  GLUCOSE 83 99 87 80 76  BUN 60* 61* 65* 61* 57*  CREATININE 4.31* 4.08* 4.03* 3.52* 3.09*  CALCIUM 7.5* 7.5* 7.5* 7.7* 7.8*   Liver Function Tests:  Recent Labs Lab 07/24/16 0526  AST 20  ALT 21  ALKPHOS 95  BILITOT 0.5  PROT 8.0  ALBUMIN 4.1   No results for input(s): LIPASE, AMYLASE in the last 168 hours. No results for input(s): AMMONIA in the last 168 hours. CBC:  Recent Labs Lab 07/24/16 0526 07/25/16 0849 07/26/16 0539 07/27/16 0552 07/28/16 0532 07/29/16 0535  WBC 10.2 9.6 25.0* 17.6* 15.0* 9.8  NEUTROABS 8.8*  --   --  15.0*  --  7.1  HGB 10.4* 7.5* 7.6* 7.6* 8.7* 9.0*  HCT 32.1* 23.9* 23.7* 23.5* 26.9* 27.7*  MCV 91.5 92.6 91.9 91.1 90.0 90.2  PLT 230 159 146* 141* 151 166   Cardiac Enzymes: No results for input(s): CKTOTAL, CKMB, CKMBINDEX, TROPONINI in the last 168 hours. BNP: BNP (last 3 results) No results for input(s): BNP in the last 8760 hours.  ProBNP (last 3 results) No results for input(s): PROBNP in the last 8760 hours.  CBG: No results for input(s): GLUCAP in the last 168 hours.     SignedIrine Seal MD.  [facility] 07/29/2016, 3:00 PM

## 2016-07-29 NOTE — Progress Notes (Signed)
Physical Therapy Treatment Patient Details Name: Vincent Potts MRN: [id number] DOB: [birth year] Today's Date: 07/29/2016    History of Present Illness 83 y.o.male admitted to ED from SNF for mechanical fall. Apparently patient fell sometime 5/25-it was a unwitnessed fall-subsequently patient started complaining of pain in his knee and his left hip. Underwent surgery on 5/26 for intramedullary nail intertrachantric (left) procedure. Ptwith medical history significant of dementia, hypertension, BPH, gait disorder, and chronic kidney disease stage IV.    PT Comments    Pt is limited by back and L hip pain as well as limited ability to follow commands and making slow progress towards his goals. Pt is currently maxAx2 for bed mobility and squat pivot transfers. Pt was able to sit EoB with no support today for approximately 2 minutes. Pt requires skilled PT to progress his bed mobility and transfer training as well as to improve strength and endurance to be able to safely mobilize in his discharge environment.     Follow Up Recommendations  SNF;Supervision/Assistance - 24 hour     Equipment Recommendations  None recommended by PT    Recommendations for Other Services       Precautions / Restrictions Precautions Precautions: Fall Restrictions Weight Bearing Restrictions: Yes LLE Weight Bearing: Partial weight bearing LLE Partial Weight Bearing Percentage or Pounds: 50%    Mobility  Bed Mobility Overal bed mobility: Needs Assistance Bed Mobility: Supine to Sit       Sit to supine: Max assist;+2 for physical assistance   General bed mobility comments: max A for LE management to the floot and trunk to upright. able to sit EoB with minimal posterior lean that was pt able to correct with vc   Transfers Overall transfer level: Needs assistance Equipment used: None Transfers: Squat Pivot Transfers     Squat pivot transfers: +2 safety/equipment;Max assist     General transfer  comment: Pt initiated movement but became maxAx2 when he became fearful with standing on RLE vc for PWB through L LE pt maintained PWB due to pain                Balance Overall balance assessment: Needs assistance Sitting-balance support: Feet supported;No upper extremity supported Sitting balance-Leahy Scale: Fair   Postural control: Posterior lean;Other (comment) Standing balance support: During functional activity;No upper extremity supported Standing balance-Leahy Scale: Zero                              Cognition Arousal/Alertness: Awake/alert Behavior During Therapy: WFL for tasks assessed/performed (Anxiety related to back pain) Overall Cognitive Status: History of cognitive impairments - at baseline                                               Pertinent Vitals/Pain Pain Assessment: Faces Faces Pain Scale: Hurts even more Pain Location: back pain and LLE with movement Pain Descriptors / Indicators: Constant;Grimacing;Discomfort;Moaning Pain Intervention(s): Monitored during session;Limited activity within patient's tolerance;Ice applied  VSS           PT Goals (current goals can now be found in the care plan section) Acute Rehab PT Goals Patient Stated Goal: pt unable to state PT Goal Formulation: Patient unable to participate in goal setting Time For Goal Achievement: 08/01/16 Potential to Achieve Goals: Fair    Frequency  Min 3X/week      PT Plan Current plan remains appropriate       AM-PAC PT "6 Clicks" Daily Activity  Outcome Measure  Difficulty turning over in bed (including adjusting bedclothes, sheets and blankets)?: Total Difficulty moving from lying on back to sitting on the side of the bed? : Total Difficulty sitting down on and standing up from a chair with arms (e.g., wheelchair, bedside commode, etc,.)?: Total Help needed moving to and from a bed to chair (including a wheelchair)?: A Lot Help needed  walking in hospital room?: Total Help needed climbing 3-5 steps with a railing? : Total 6 Click Score: 7    End of Session Equipment Utilized During Treatment: Gait belt Activity Tolerance: Patient limited by pain Patient left: in chair;with chair alarm set;with call bell/phone within reach Nurse Communication: Mobility status PT Visit Diagnosis: Difficulty in walking, not elsewhere classified (R26.2)     Time: 6226-3335 PT Time Calculation (min) (ACUTE ONLY): 23 min  Charges:  $Therapeutic Activity: 23-37 mins                    G Codes:       Coralee Edberg B. Migdalia Dk PT, DPT Acute Rehabilitation  [phone] Pager [phone]     [place] 07/29/2016, 1:55 PM

## 2016-07-29 NOTE — Clinical Social Work Note (Addendum)
Clinical Social Work Assessment  Patient Details  Name: Vincent Potts MRN: [id number] Date of Birth: 1924-07-03  Date of referral:  07/28/16               Reason for consult:  Facility Placement                Permission sought to share information with:  Facility Art therapist granted to share information::  Yes, Verbal Permission Granted  Name::        Agency::  SNF  Relationship::     Contact Information:     Housing/Transportation Living arrangements for the past 2 months:  [place] of Information:  Facility, Other (Comment Required) (Family) Patient Interpreter Needed:  None Criminal Activity/Legal Involvement Pertinent to Current Situation/Hospitalization:  No - Comment as needed Significant Relationships:    Lives with:  Facility Resident Do you feel safe going back to the place where you live?  Yes Need for family participation in patient care:  No (Coment)  Care giving concerns:  Patient fell at [facility] and is looking to return to the SNF unit of that facility.  Patient is not safe to return to the ALF side of the facility.  Social Worker assessment / plan:  Patient will return to [facility] for skilled nursing. CSW spoke with Vincent Potts, nurse at facility and patient is able to return and can receive skilled rehab at facility. CSW called nephew and left message to advise of DC to facility.  CSW was able to speak with nephew Vincent Potts and discussed the plan for DC. Nephew in agreement.  2 completed. Passr completed. Offer sent to SNF.  Employment status:  Retired Forensic scientist:  Medicare PT Recommendations:  [place] / Referral to community resources:  [place]  Patient/Family's Response to care:  Patient returning to SNF for rehabilitation. Nephew is appreciative of assistance with transition to SNF for rehab. No issues or concerns at this time.  Patient/Family's  Understanding of and Emotional Response to Diagnosis, Current Treatment, and Prognosis:  Patient only oriented to person. Facility has been involved and understand diagnosis, current treatment and prognosis. Nephew has good understanding of plan for care and is in agreement. No issues or concerns at this time.  Emotional Assessment Appearance:  Appears stated age Attitude/Demeanor/Rapport:   (Cooperative) Affect (typically observed):  Accepting, Appropriate Orientation:  Oriented to Self Alcohol / Substance use:  Not Applicable Psych involvement (Current and /or in the community):  No (Comment)  Discharge Needs  Concerns to be addressed:  Care Coordination Readmission within the last 30 days:  No Current discharge risk:  Dependent with Mobility, Physical Impairment Barriers to Discharge:  No Barriers Identified   Vincent Baxter, LCSW 07/29/2016, 2:09 PM

## 2016-07-29 NOTE — Clinical Social Work Placement (Signed)
   CLINICAL SOCIAL WORK PLACEMENT  NOTE  Date:  07/29/2016  Patient Details  Name: Vincent Potts MRN: [id number] Date of Birth: [birth year]  Clinical Social Work is seeking post-discharge placement for this patient at the [place] level of care (*CSW will initial, date and re-position this form in  chart as items are completed):  Yes   Patient/family provided with [place] Work Department's list of facilities offering this level of care within the geographic area requested by the patient (or if unable, by the patient's family).  Yes   Patient/family informed of their freedom to choose among providers that offer the needed level of care, that participate in Medicare, Medicaid or managed care program needed by the patient, have an available bed and are willing to accept the patient.  Yes   Patient/family informed of [facility]'s ownership interest in [facility] and [facility], as well as of the fact that they are under no obligation to receive care at these facilities.  PASRR submitted to EDS on       PASRR number received on       Existing PASRR number confirmed on 07/28/16     FL2 transmitted to all facilities in geographic area requested by pt/family on 07/28/16     FL2 transmitted to all facilities within larger geographic area on 07/28/16     Patient informed that his/her managed care company has contracts with or will negotiate with certain facilities, including the following:        Yes   Patient/family informed of bed offers received.  Patient chooses bed at [facility]     Physician recommends and patient chooses bed at      Patient to be transferred to [facility] on 07/29/16.  Patient to be transferred to facility by [facility]     Patient family notified on 07/29/16 of transfer.  Name of family member notified:  nephew, Cecilie Lowers     PHYSICIAN Please prepare priority discharge summary, including medications, Please  sign FL2, Please sign DNR, Please prepare prescriptions     Additional Comment:    _______________________________________________ Normajean Baxter, LCSW 07/29/2016, 2:29 PM

## 2016-07-29 NOTE — Care Management Important Message (Signed)
Important Message  Patient Details  Name: Vincent Potts MRN: [id number] Date of Birth: 12/21/1924   Medicare Important Message Given:  Yes    [name] [address] 07/29/2016, 10:53 AM

## 2016-07-30 ENCOUNTER — Telehealth: Payer: Self-pay

## 2016-07-30 ENCOUNTER — Non-Acute Institutional Stay (SKILLED_NURSING_FACILITY): Payer: Medicare Other | Admitting: Nurse Practitioner

## 2016-07-30 ENCOUNTER — Encounter: Payer: Self-pay | Admitting: Nurse Practitioner

## 2016-07-30 DIAGNOSIS — F0151 Vascular dementia with behavioral disturbance: Secondary | ICD-10-CM

## 2016-07-30 DIAGNOSIS — R627 Adult failure to thrive: Secondary | ICD-10-CM

## 2016-07-30 DIAGNOSIS — S72002S Fracture of unspecified part of neck of left femur, sequela: Secondary | ICD-10-CM | POA: Diagnosis not present

## 2016-07-30 DIAGNOSIS — K5900 Constipation, unspecified: Secondary | ICD-10-CM

## 2016-07-30 DIAGNOSIS — D631 Anemia in chronic kidney disease: Secondary | ICD-10-CM

## 2016-07-30 DIAGNOSIS — M5440 Lumbago with sciatica, unspecified side: Secondary | ICD-10-CM | POA: Diagnosis not present

## 2016-07-30 DIAGNOSIS — K219 Gastro-esophageal reflux disease without esophagitis: Secondary | ICD-10-CM | POA: Diagnosis not present

## 2016-07-30 DIAGNOSIS — I10 Essential (primary) hypertension: Secondary | ICD-10-CM

## 2016-07-30 DIAGNOSIS — F320 Major depressive disorder, single episode, mild: Secondary | ICD-10-CM | POA: Diagnosis not present

## 2016-07-30 DIAGNOSIS — N184 Chronic kidney disease, stage 4 (severe): Secondary | ICD-10-CM

## 2016-07-30 DIAGNOSIS — F01518 Vascular dementia, unspecified severity, with other behavioral disturbance: Secondary | ICD-10-CM

## 2016-07-30 DIAGNOSIS — N400 Enlarged prostate without lower urinary tract symptoms: Secondary | ICD-10-CM

## 2016-07-30 DIAGNOSIS — E871 Hypo-osmolality and hyponatremia: Secondary | ICD-10-CM | POA: Diagnosis not present

## 2016-07-30 NOTE — Telephone Encounter (Signed)
This is a patient of [facility], who was admitted to [facility] after hospitalization. [facility] F/U is needed. Hospital discharge from [facility] on 07/29/2016.

## 2016-07-30 NOTE — Progress Notes (Signed)
Location:  [place] Room Number: 25 Place of Service:  SNF (31) Provider:  Aedin Jeansonne, [place]xie  NP  Estill Dooms, MD  Patient Care Team: Estill Dooms, MD as PCP - General (Internal Medicine) Caryn Gienger X, NP as Nurse Practitioner (Internal Medicine) Fleet Contras, MD as Consulting Physician (Nephrology)  Extended Emergency Contact Information Primary Emergency Contact: Phillis Haggis [address] [address] of [place] Phone: [phone] Relation: Nephew Secondary Emergency Contact: Neita Goodnight [place] of [place] Mobile Phone: [phone] Relation: None  Code Status:  DNR Goals of care: Advanced Directive information Advanced Directives 07/30/2016  Does Patient Have a Medical Advance Directive? Yes  Type of Paramedic of [place];Living will;Out of facility DNR (pink MOST or yellow form)  Does patient want to make changes to medical advance directive? No - Patient declined  Copy of [place] in Chart? Yes  Pre-existing out of facility DNR order (yellow form or pink MOST form) Yellow form placed in chart (order not valid for inpatient use)     Chief Complaint  Patient presents with  . Medical Management of Chronic Issues    HPI:  Pt is a 83 y.o. male seen today for managing left hip pain related to s/p left hip surgery.    Hospitalized 07/24/16 to 07/29/16 for left hip fracture sustained from a mechanical fall at [facility]. S/psurgical repair 5/26 by Dr. [name], taking ASA bid for DVT prophylaxis, f/u Dr. [name] in 2 weeks. He is unable to voice pain and request Norco for left hip pain, his POA recommended bid and prn to increase his tolerance of activities related to therapy.   Hx of HTN controlled while on Amlodipine 5mg , GERD is stable, off Zantac, Omeprazole, dementia has been on Namenda and resided in [place].  Hx of chronic kidney disease with baseline creat 3.09 07/29/16. Hx of BPH  with recurrent urinary retention is the past, taking Tamsulosin and Finasteride, sleeps well, but his appetite is reportedly poor, on Mirtazapine and Effexor. Post Op anemia, Hgb 9.0 07/29/16  Past Medical History:  Diagnosis Date  . Anemia   . Anxiety   . Back pain   . Cataract   . Chronic kidney disease 2013   rhabdo  . CKD (chronic kidney disease) stage 3, GFR 30-59 ml/min 08/29/2012   Creatinine 1.88 07/05/13(baseline 1.5-2.0) 02/11/14 creatinine 2.00 05/16/14 creatinine 2.31 07/18/14 creatinine 2.22     . CVA (cerebral infarction)   . Cystic disease of liver   . Dementia with behavioral disturbance 06/06/2012  . Depression 06/06/2012  . Diverticulosis   . Edema   . Gait disorder   . GERD (gastroesophageal reflux disease)   . H/O hiatal hernia 2008   repaired  . Hearing loss   . Hearing loss   . Hypertension   . Hyponatremia   . Inguinal hernia   . Keratosis   . Paresthesia   . Parkinsonism ([place])   . PVC (premature ventricular contraction)   . Sciatica   . Sciatica   . Stroke [facility]) 2010   no deficits  . Thyroid disease   . Vitamin D deficiency disease    Past Surgical History:  Procedure Laterality Date  . APPENDECTOMY    . ESOPHAGOGASTRODUODENOSCOPY  10/25/2011   Procedure: ESOPHAGOGASTRODUODENOSCOPY (EGD);  Surgeon: Winfield Cunas., MD;  Location: [facility] ENDOSCOPY;  Service: Endoscopy;  Laterality: N/A;  . HERNIA REPAIR    .  INTRAMEDULLARY (IM) NAIL INTERTROCHANTERIC Left 07/24/2016   Procedure: INTRAMEDULLARY (IM) NAIL INTERTROCHANTRIC;  Surgeon: Nicholes Stairs, MD;  Location: [place];  Service: Orthopedics;  Laterality: Left;  . PROSTATE SURGERY     cancer    Allergies  Allergen Reactions  . Olanzapine Other (See Comments)    unknown  . Rivastigmine Nausea Only    Outpatient Encounter Prescriptions as of 07/30/2016  Medication Sig  . HYDROcodone-acetaminophen (NORCO) 5-325 MG tablet Take 1-2 tablets by mouth every 6 (six) hours as needed for moderate pain.  .  [DISCONTINUED] acetaminophen (TYLENOL) 325 MG tablet Take 650 mg by mouth every 4 (four) hours as needed for mild pain or moderate pain.   . [DISCONTINUED] acetaminophen (TYLENOL) 500 MG tablet Take 500 mg by mouth at bedtime.   . [DISCONTINUED] amLODipine (NORVASC) 5 MG tablet Take 5 mg by mouth daily.   . [DISCONTINUED] aspirin EC 81 MG tablet Take 1 tablet (81 mg total) by mouth 2 (two) times daily.  . [DISCONTINUED] bisacodyl (DULCOLAX) 10 MG suppository Place 10 mg rectally daily as needed for moderate constipation.  . [DISCONTINUED] Cholecalciferol (VITAMIN D-3) 1000 units CAPS Take 1,000 Units by mouth daily.   . [DISCONTINUED] finasteride (PROSCAR) 5 MG tablet Take 5 mg by mouth daily.  . [DISCONTINUED] fluticasone (FLONASE) 50 MCG/ACT nasal spray Place 1 spray into both nostrils daily.  . [DISCONTINUED] loteprednol (LOTEMAX) 0.5 % ophthalmic suspension Place 1 drop into the left eye daily.  . [DISCONTINUED] magnesium hydroxide (MILK OF MAGNESIA) 400 MG/5ML suspension Take 30 mLs by mouth daily as needed for mild constipation.   . [DISCONTINUED] Melatonin 3 MG TABS Take 3 mg by mouth at bedtime.   . [DISCONTINUED] memantine (NAMENDA XR) 28 MG CP24 24 hr capsule Take 28 mg by mouth daily. Take one daily for memory  . [DISCONTINUED] mirtazapine (REMERON) 30 MG tablet Take 30 mg by mouth at bedtime.  . [DISCONTINUED] Nutritional Supplements (RESOURCE BREEZE PO) Take 120 mLs by mouth 4 (four) times daily. nutritional supplement  . [DISCONTINUED] Omega-3 Fatty Acids (FISH OIL) 1000 MG CAPS Take 2,000 mg by mouth daily.   . [DISCONTINUED] Polyethyl Glycol-Propyl Glycol (SYSTANE) 0.4-0.3 % SOLN Place 1 drop into both eyes 3 (three) times daily.  . [DISCONTINUED] senna (SENOKOT) 8.6 MG tablet Take 2 tablets by mouth daily.   . [DISCONTINUED] sodium chloride (MURO 128) 2 % ophthalmic solution Place 1 drop into the left eye 3 (three) times daily.   . [DISCONTINUED] sodium chloride (MURO 128) 5 %  ophthalmic ointment Place 1 application into the left eye at bedtime.   . [DISCONTINUED] sodium fluoride (PREVIDENT 5000 PLUS) 1.1 % CREA dental cream Place 1 application onto teeth every evening.  . [DISCONTINUED] Tamsulosin HCl (FLOMAX) 0.4 MG CAPS Take 0.4 mg by mouth every morning.   . [DISCONTINUED] venlafaxine XR (EFFEXOR-XR) 75 MG 24 hr capsule Take 75 mg by mouth daily with breakfast.   No facility-administered encounter medications on file as of 07/30/2016.     Review of Systems  Constitutional: Positive for appetite change, fatigue and unexpected weight change. Negative for chills, diaphoresis and fever.       C/o poor appetite  HENT: Positive for hearing loss, sinus pressure and voice change (hoarse). Negative for congestion, ear discharge, ear pain, nosebleeds, postnasal drip, rhinorrhea, sore throat and tinnitus.        Nasal congestion at night  Eyes: Negative for photophobia, pain, discharge and redness.  Respiratory: Negative for cough, shortness of breath,  wheezing and stridor.   Cardiovascular: Negative for chest pain, palpitations and leg swelling.  Gastrointestinal: Negative for abdominal pain, blood in stool, constipation, diarrhea, nausea and vomiting.  Endocrine: Negative for polydipsia.  Genitourinary: Positive for frequency. Negative for dysuria, flank pain, hematuria and urgency.       CKD 4 Recurrent UTI. Last with pseudomonas.  Musculoskeletal: Positive for back pain and gait problem. Negative for myalgias and neck pain.  Skin: Negative for rash.       Left hip surgical incision intact, mild peri wound erythema, small dried serous drainage presents.  Allergic/Immunologic: Negative for environmental allergies.  Neurological: Positive for weakness. Negative for dizziness, tremors, seizures and headaches.       Memory loss  Hematological: Does not bruise/bleed easily.  Psychiatric/Behavioral: Positive for confusion and sleep disturbance. Negative for hallucinations  and suicidal ideas. The patient is not nervous/anxious.     Immunization History  Administered Date(s) Administered  . DTaP 10/31/2011  . Influenza Whole 12/20/2012  . Influenza-Unspecified 12/06/2013, 12/06/2014, 12/11/2015  . Pneumococcal-Unspecified 10/31/2011  . Tdap 10/24/2011   Pertinent  Health Maintenance Due  Topic Date Due  . PNA vac Low Risk Adult (2 of 2 - PCV13) 10/30/2012  . INFLUENZA VACCINE  09/29/2016   Fall Risk  01/12/2016 09/12/2014 05/23/2014  Falls in the past year? Yes Yes No  Number falls in past yr: 1 1 -  Injury with Fall? Yes No -  Risk for fall due to : - - History of fall(s);Impaired balance/gait   Functional Status Survey:    Vitals:   07/30/16 1221  BP: (!) 165/70  Pulse: 75  Resp: 18  Temp: 97.2 F (36.2 C)  Weight: 130 lb (59 kg)  Height: 5\' 5"  (1.651 m)   Body mass index is 21.63 kg/m. Physical Exam  Constitutional: He appears well-developed and well-nourished.  HENT:  Head: Normocephalic and atraumatic. Not macrocephalic.  Nose: No mucosal edema or rhinorrhea. Right sinus exhibits no maxillary sinus tenderness and no frontal sinus tenderness. Left sinus exhibits no maxillary sinus tenderness and no frontal sinus tenderness.  Mouth/Throat: Uvula is midline. Posterior oropharyngeal erythema present. No oropharyngeal exudate or posterior oropharyngeal edema.  Eyes: EOM are normal. Pupils are equal, round, and reactive to light.  Neck: Normal range of motion. Neck supple. No JVD present. No thyromegaly present.  Cardiovascular: Normal rate and regular rhythm.   Murmur heard.  Systolic murmur is present with a grade of 2/6  Pulmonary/Chest: Effort normal and breath sounds normal. He has no wheezes. He has no rales.  Abdominal: Soft. Bowel sounds are normal. There is no tenderness.  Musculoskeletal: Normal range of motion. He exhibits tenderness. He exhibits no edema.  Scoliokyphosis, walking with walker with SBA. Chronic back pain. 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  Lymphadenopathy:    He has no cervical adenopathy.  Neurological: He is alert. He displays normal reflexes. No cranial nerve deficit. He exhibits normal muscle tone. Coordination normal.  Memory loss. Mumbling and incoherent answers sometimes.  Skin: Skin is warm and dry. No rash noted. No erythema.  AK top of head Left hip surgical incision intact, mild peri wound erythema, small dried serous drainage presents.  Psychiatric: He has a normal mood and affect. His behavior is normal. His mood appears not anxious. His affect is not angry, not blunt and not labile. His speech is not delayed and not slurred. He is not agitated, not aggressive, not slowed, not withdrawn, not actively hallucinating  and not combative. Thought content is not paranoid and not delusional. Cognition and memory are impaired. He expresses impulsivity. He does not exhibit a depressed mood. He exhibits abnormal recent memory. He is attentive.    Labs reviewed:  Recent Labs  07/27/16 0552 07/28/16 07/28/16 0532 07/29/16 07/29/16 0535  NA 144 143 143 145 145  K 4.5 4.1 4.1  --  4.2  CL 117*  --  116*  --  119*  CO2 20*  --  21*  --  19*  GLUCOSE 87  --  80  --  76  BUN 65* 61* 61* 57* 57*  CREATININE 4.03* 3.5* 3.52* 3.1* 3.09*  CALCIUM 7.5*  --  7.7*  --  7.8*    Recent Labs  05/13/16 07/22/16 07/24/16 0526  AST 11* 11* 20  ALT 10 9* 21  ALKPHOS 81 84 95  BILITOT  --   --  0.5  PROT  --   --  8.0  ALBUMIN  --   --  4.1    Recent Labs  07/24/16 0526  07/27/16 0552 07/28/16 07/28/16 0532 07/29/16 07/29/16 0535  WBC 10.2  < > 17.6* 15.0 15.0* 9.8 9.8  NEUTROABS 8.8*  --  15.0*  --   --   --  7.1  HGB 10.4*  < > 7.6* 8.7* 8.7*  --  9.0*  HCT 32.1*  < > 23.5* 27* 26.9*  --  27.7*  MCV 91.5  < > 91.1  --  90.0  --  90.2  PLT 230  < > 141* 151 151  --  166  < > = values in this interval not displayed. Lab Results  Component Value Date   TSH 0.70 07/22/2016     Lab Results  Component Value Date   HGBA1C 5.3 06/12/2015   No results found for: CHOL, HDL, LDLCALC, LDLDIRECT, TRIG, CHOLHDL  Significant Diagnostic Results in last 30 days:  Dg Chest 1 View  Result Date: 07/24/2016 CLINICAL DATA:  Left hip fracture.  History of hypertension. EXAM: CHEST 1 VIEW COMPARISON:  10/24/2011 FINDINGS: Shallow inspiration with linear atelectasis in the lung bases. Heart size and pulmonary vascularity are normal. No consolidation. No blunting of costophrenic angles. No pneumothorax. Calcified and tortuous aorta. Old bilateral rib fractures. IMPRESSION: Shallow inspiration with atelectasis in the lung bases. Electronically Signed   By: Lucienne Capers M.D.   On: 07/24/2016 04:55   Dg Chest Port 1 View  Result Date: 07/26/2016 CLINICAL DATA:  Leukocytosis. EXAM: PORTABLE CHEST 1 VIEW COMPARISON:  07/24/2016 FINDINGS: Cardiac silhouette is normal in size. Aortic atherosclerosis is noted. Lung volumes remain diminished with persistent mild atelectasis in the lung bases. No lobar consolidation, edema, sizable pleural effusion, or pneumothorax is identified. Multiple old rib fractures are again noted. IMPRESSION: Low lung volumes with mild bibasilar atelectasis. Electronically Signed   By: Logan Bores M.D.   On: 07/26/2016 08:52   Dg Knee Complete 4 Views Left  Result Date: 07/24/2016 CLINICAL DATA:  Knee swelling, pain. EXAM: LEFT KNEE - COMPLETE 4+ VIEW COMPARISON:  None. FINDINGS: No acute fracture deformity or dislocation. Severe osteopenia without destructive bony lesions. No advanced degenerative change for age. Severe vascular calcifications. Soft tissue planes are otherwise unremarkable. IMPRESSION: No acute osseous process. Osteopenia which decreases sensitivity for acute nondisplaced fractures. Electronically Signed   By: Elon Alas M.D.   On: 07/24/2016 04:27   Dg C-arm 1-60 Min  Result Date: 07/24/2016 CLINICAL DATA:  Fracture repair in  the left hip.  FLUOROSCOPY TIME:  55.1 seconds. Images: 4 EXAM: DG C-ARM 61-120 MIN; OPERATIVE LEFT HIP WITH PELVIS COMPARISON:  Jul 24, 2016 FINDINGS: Rods and nails have been placed across the intertrochanteric fracture on the left. Hardware is in good position. There is a distal interlocking screw. IMPRESSION: Left hip fracture repair as above. Electronically Signed   By: Dorise Bullion III M.D   On: 07/24/2016 12:32   Dg Hip Operative Unilat W Or W/o Pelvis Left  Result Date: 07/24/2016 CLINICAL DATA:  Fracture repair in the left hip. FLUOROSCOPY TIME:  55.1 seconds. Images: 4 EXAM: DG C-ARM 61-120 MIN; OPERATIVE LEFT HIP WITH PELVIS COMPARISON:  Jul 24, 2016 FINDINGS: Rods and nails have been placed across the intertrochanteric fracture on the left. Hardware is in good position. There is a distal interlocking screw. IMPRESSION: Left hip fracture repair as above. Electronically Signed   By: Dorise Bullion III M.D   On: 07/24/2016 12:32   Dg Hip Unilat With Pelvis 2-3 Views Left  Result Date: 07/24/2016 CLINICAL DATA:  Unwitnessed fall.  Left hip pain. EXAM: DG HIP (WITH OR WITHOUT PELVIS) 2-3V LEFT COMPARISON:  None. FINDINGS: There is an acute comminuted fracture of the inter trochanteric left hip with varus angulation. Displaced greater trochanteric fragment. No dislocation of the hip. Pelvis and right hip appear intact. Degenerative changes in the lower lumbar spine. Vascular calcifications. IMPRESSION: Acute comminuted inter trochanteric fracture of the left hip with varus angulation. Electronically Signed   By: Lucienne Capers M.D.   On: 07/24/2016 04:54    Assessment/Plan Hip fracture ([place]) Hospitalized 07/24/16 to 07/29/16 for left hip fracture sustained from a mechanical fall at [facility]. S/psurgical repair 5/26 by Dr. [name], taking ASA bid for DVT prophylaxis, f/u Dr. [name] in 2 weeks. He is unable to voice pain and request Norco for left hip pain, his POA recommended bid and prn to increase his  tolerance of activities related to therapy.    Anemia in chronic kidney disease Anemia from CKD and post Op, last Hgb 9.0 07/29/16, update CBC in one week.   CKD (chronic kidney disease) stage 4, GFR 15-29 ml/min (HCC) 07/29/16 Na 145, K 4.2, Bun 57, creat 3.09, underwent Nephrology evaluation, not an candidate for dialysis. Repeat BMP in one week.   Hypertension Controlled, continue Amlodipine 5mg    GERD (gastroesophageal reflux disease) Stable, off Omeprazole, Zantac.   Constipation Stable, continue Senokot S II qhs and MOM qd prn, Doculax suppository prn.   Dementia with behavioral disturbance Forgetful, continue SNF for care assistance, continue Namenda  BPH (benign prostatic hyperplasia) Last urinary retention, Foley inserted 11/04/12 at ED-successfully removed--continued Tamsulosin and Finasteride  Back pain Severe kyphosis, only walks a few steps with walker prior to the left hip surgery  Depression Mood is stable, continue Effexor 75mg  and Mirtazapine 7.5mg  qhs due to relapsed insomnia(off it since 11/18/15).  Sleeps well at night  Hyponatremia Normalized, last Na 145 07/29/16  Adult failure to thrive Persists, continue supportive measures.      Family/ staff Communication: SNF  Labs/tests ordered:  CBC BMP one week

## 2016-07-30 NOTE — Progress Notes (Signed)
: Provider:  Noah Delaine. Sheppard Coil, MD Location:  [place] Room Number: 25 Place of Service:  SNF (31)  PCP: Blanchie Serve, MD Patient Care Team: Blanchie Serve, MD as PCP - General (Internal Medicine) Mast, Man X, NP as Nurse Practitioner (Internal Medicine) Fleet Contras, MD as Consulting Physician (Nephrology)  Extended Emergency Contact Information Primary Emergency Contact: Phillis Haggis [address] [address] [place] of [place] Phone: [phone] Relation: Nephew Secondary Emergency Contact: Neita Goodnight [place] of [place] Mobile Phone: [phone] Relation: None     Allergies: Olanzapine and Rivastigmine  Chief Complaint  Patient presents with  . New Admit To SNF    HPI: Patient is 83 y.o. male with dementia, hypertension, BPH, gait disorder, chronic kidney disease stage IV who was brought to the ED from his skilled nursing facility after he sustained a mechanical fall and left hip and knee pain. Patient is a very poor historian-he has advanced dementia and also has significant hearing issues-most of this history is obtained after reviewing ED chart. Apparently patient fell sometime 1 day prior to admission-it was a unwitnessed fall-subsequently patient started complaining of pain in his knee and his left hip. He was brought to the emergency room, where a x-ray showed a left hip fracture. Orthopedics (Dr. Doran Durand) was consulted by the emergency department physician, recommendations were to transfer to [facility] where he was admitted from 5/26-31 and underwent surgical repair. Hospital course was complicated bu acute on CKD 4 , ABLA. requiring a transfusion amd leukocytosis with negative work-up.Marland KitchenPt is admitted to SNF for residential care and for OT/PT. While at SNF pt will be followed for HTN, tx with norvasc, BPH, tx withflomax and finasteride and dementia, tx with namenda and remeron.  Past Medical History:  Diagnosis Date  .  Anemia   . Anxiety   . Back pain   . Cataract   . Chronic kidney disease 2013   rhabdo  . CKD (chronic kidney disease) stage 3, GFR 30-59 ml/min 08/29/2012   Creatinine 1.88 07/05/13(baseline 1.5-2.0) 02/11/14 creatinine 2.00 05/16/14 creatinine 2.31 07/18/14 creatinine 2.22     . CVA (cerebral infarction)   . Cystic disease of liver   . Dementia with behavioral disturbance 06/06/2012  . Depression 06/06/2012  . Diverticulosis   . Edema   . Gait disorder   . GERD (gastroesophageal reflux disease)   . H/O hiatal hernia 2008   repaired  . Hearing loss   . Hearing loss   . Hypertension   . Hyponatremia   . Inguinal hernia   . Keratosis   . Paresthesia   . Parkinsonism ([place])   . PVC (premature ventricular contraction)   . Sciatica   . Sciatica   . Stroke [facility]) 2010   no deficits  . Thyroid disease   . Vitamin D deficiency disease     Past Surgical History:  Procedure Laterality Date  . APPENDECTOMY    . ESOPHAGOGASTRODUODENOSCOPY  10/25/2011   Procedure: ESOPHAGOGASTRODUODENOSCOPY (EGD);  Surgeon: Winfield Cunas., MD;  Location[facility] ENDOSCOPY;  Service: Endoscopy;  Laterality: N/A;  . HERNIA REPAIR    . INTRAMEDULLARY (IM) NAIL INTERTROCHANTERIC Left 07/24/2016   Procedure: INTRAMEDULLARY (IM) NAIL INTERTROCHANTRIC;  Surgeon: Nicholes Stairs, MD;  Location: [place];  Service: Orthopedics;  Laterality: Left;  . PROSTATE SURGERY     cancer    Allergies as of 07/31/2016      Reactions   Olanzapine  Other (See Comments)   unknown   Rivastigmine Nausea Only      Medication List       Accurate as of 07/31/16 11:59 PM. Always use your most recent med list.          HYDROcodone-acetaminophen 5-325 MG tablet Commonly known as:  NORCO Take 1-2 tablets by mouth every 6 (six) hours as needed for moderate pain.       No orders of the defined types were placed in this encounter.   Immunization History  Administered Date(s) Administered  . DTaP 10/31/2011  . Influenza  Whole 12/20/2012  . Influenza-Unspecified 12/06/2013, 12/06/2014, 12/11/2015  . PPD Test 04/24/2011, 05/01/2011  . Pneumococcal-Unspecified 10/31/2011  . Tdap 10/24/2011    Social History  Substance Use Topics  . Smoking status: Never Smoker  . Smokeless tobacco: Never Used  . Alcohol use No    Family history is   Family History  Problem Relation Age of Onset  . Hypertension Mother       Review of Systems  DATA OBTAINED: from nurse GENERAL:  no fevers, fatigue, appetite changes SKIN: No itching, or rash EYES: No eye pain, redness, discharge EARS: No earache, tinnitus, change in hearing NOSE: No congestion, drainage or bleeding  MOUTH/THROAT: No mouth or tooth pain, No sore throat RESPIRATORY: No cough, wheezing, SOB CARDIAC: No chest pain, palpitations, lower extremity edema  GI: No abdominal pain, No N/V/D or constipation, No heartburn or reflux  GU: No dysuria, frequency or urgency, or incontinence  MUSCULOSKELETAL: No unrelieved bone/joint pain NEUROLOGIC: No headache, dizziness or focal weakness PSYCHIATRIC: No c/o anxiety or sadness   Vitals:   08/01/16 2309  BP: (!) 165/70  Pulse: 75  Resp: 18  Temp: 97.2 F (36.2 C)    SpO2 Readings from Last 1 Encounters:  08/06/16 99%   Body mass index is 21.63 kg/m.     Physical Exam  GENERAL APPEARANCE: Alert,  No acute distress.  SKIN: No diaphoresis rash; incision area with expected mild heat and swelling HEAD: Normocephalic, atraumatic  EYES: Conjunctiva/lids clear. Pupils round, reactive. EOMs intact.  EARS: External exam WNL, canals clear. Very HOH, no hearing aids.  NOSE: No deformity or discharge.  MOUTH/THROAT: Lips w/o lesions  RESPIRATORY: Breathing is even, unlabored. Lung sounds are clear   CARDIOVASCULAR: Heart RRR no murmurs, rubs or gallops. No peripheral edema.   GASTROINTESTINAL: Abdomen is soft, non-tender, not distended w/ normal bowel sounds. GENITOURINARY: Bladder non tender, not  distended  MUSCULOSKELETAL: No abnormal joints or musculature NEUROLOGIC:  Cranial nerves 2-12 grossly intact. Moves all extremities  PSYCHIATRIC: Mood and affect with dementia, no behavioral issues  Patient Active Problem List   Diagnosis Date Noted  . Fall at nursing home   . Leukocytosis   . Hip fracture ([place]) 07/24/2016  . Adult failure to thrive 07/20/2016  . Otitis externa 06/15/2016  . Blepharitis of both eyes 06/11/2016  . Epistaxis 05/11/2016  . Loss of weight 02/10/2016  . UTI (urinary tract infection) 12/16/2015  . Anemia in chronic kidney disease 06/12/2015  . AK (actinic keratosis) 03/19/2014  . Sinusitis, chronic 01/08/2014  . Insomnia 06/08/2013  . CKD (chronic kidney disease) stage 4, GFR 15-29 ml/min (HCC) 08/29/2012  . Depression 06/06/2012  . Dementia with behavioral disturbance 06/06/2012  . Constipation 06/06/2012  . BPH (benign prostatic hyperplasia) 06/06/2012  . Nephrolithiasis 10/25/2011  . Hyponatremia 10/24/2011  . Fall 04/19/2011  . Parkinsonism ([place])   . CVA (cerebral infarction)   .  Hypertension   . Back pain   . Hearing loss   . Gait disorder   . Diverticulosis   . GERD (gastroesophageal reflux disease)       Labs reviewed: Basic Metabolic Panel:    Component Value Date/Time   NA 142 08/05/2016   K 5.6 (A) 08/05/2016   CL 119 (H) 07/29/2016 0535   CO2 19 (L) 07/29/2016 0535   GLUCOSE 76 07/29/2016 0535   BUN 54 (A) 08/05/2016   CREATININE 3.0 (A) 08/05/2016   CREATININE 3.09 (H) 07/29/2016 0535   CALCIUM 7.8 (L) 07/29/2016 0535   PROT 8.0 07/24/2016 0526   ALBUMIN 4.1 07/24/2016 0526   AST 20 07/24/2016 0526   ALT 21 07/24/2016 0526   ALKPHOS 95 07/24/2016 0526   BILITOT 0.5 07/24/2016 0526   GFRNONAA 16 (L) 07/29/2016 0535   GFRAA 19 (L) 07/29/2016 0535     Recent Labs  07/27/16 0552  07/28/16 0532 07/29/16 07/29/16 0535 08/05/16  NA 144  < > 143 145 145 142  K 4.5  < > 4.1  --  4.2 5.6*  CL 117*  --  116*  --   119*  --   CO2 20*  --  21*  --  19*  --   GLUCOSE 87  --  80  --  76  --   BUN 65*  < > 61* 57* 57* 54*  CREATININE 4.03*  < > 3.52* 3.1* 3.09* 3.0*  CALCIUM 7.5*  --  7.7*  --  7.8*  --   < > = values in this interval not displayed. Liver Function Tests:  Recent Labs  05/13/16 07/22/16 07/24/16 0526  AST 11* 11* 20  ALT 10 9* 21  ALKPHOS 81 84 95  BILITOT  --   --  0.5  PROT  --   --  8.0  ALBUMIN  --   --  4.1   No results for input(s): LIPASE, AMYLASE in the last 8760 hours. No results for input(s): AMMONIA in the last 8760 hours. CBC:  Recent Labs  07/24/16 0526  07/27/16 0552  07/28/16 0532 07/29/16 07/29/16 0535 08/05/16  WBC 10.2  < > 17.6*  < > 15.0* 9.8 9.8 8.6  NEUTROABS 8.8*  --  15.0*  --   --   --  7.1  --   HGB 10.4*  < > 7.6*  < > 8.7*  --  9.0* 10.1*  HCT 32.1*  < > 23.5*  < > 26.9*  --  27.7* 31*  MCV 91.5  < > 91.1  --  90.0  --  90.2  --   PLT 230  < > 141*  < > 151  --  166 303  < > = values in this interval not displayed. Lipid No results for input(s): CHOL, HDL, LDLCALC, TRIG in the last 8760 hours.  Cardiac Enzymes: No results for input(s): CKTOTAL, CKMB, CKMBINDEX, TROPONINI in the last 8760 hours. BNP: No results for input(s): BNP in the last 8760 hours. No results found for: [facility] Lab Results  Component Value Date   HGBA1C 5.3 06/12/2015   Lab Results  Component Value Date   TSH 0.70 07/22/2016   Lab Results  Component Value Date   XBJYNWGN56 2,130 (H) 04/19/2011   Lab Results  Component Value Date   FOLATE 17.2 04/19/2011   Lab Results  Component Value Date   FERRITIN 173 04/19/2011    Imaging and Procedures obtained prior to SNF admission:  Dg Chest 1 View  Result Date: 07/24/2016 CLINICAL DATA:  Left hip fracture.  History of hypertension. EXAM: CHEST 1 VIEW COMPARISON:  10/24/2011 FINDINGS: Shallow inspiration with linear atelectasis in the lung bases. Heart size and pulmonary vascularity are normal. No  consolidation. No blunting of costophrenic angles. No pneumothorax. Calcified and tortuous aorta. Old bilateral rib fractures. IMPRESSION: Shallow inspiration with atelectasis in the lung bases. Electronically Signed   By: Lucienne Capers M.D.   On: 07/24/2016 04:55   Dg Knee Complete 4 Views Left  Result Date: 07/24/2016 CLINICAL DATA:  Knee swelling, pain. EXAM: LEFT KNEE - COMPLETE 4+ VIEW COMPARISON:  None. FINDINGS: No acute fracture deformity or dislocation. Severe osteopenia without destructive bony lesions. No advanced degenerative change for age. Severe vascular calcifications. Soft tissue planes are otherwise unremarkable. IMPRESSION: No acute osseous process. Osteopenia which decreases sensitivity for acute nondisplaced fractures. Electronically Signed   By: Elon Alas M.D.   On: 07/24/2016 04:27   Dg C-arm 1-60 Min  Result Date: 07/24/2016 CLINICAL DATA:  Fracture repair in the left hip. FLUOROSCOPY TIME:  55.1 seconds. Images: 4 EXAM: DG C-ARM 61-120 MIN; OPERATIVE LEFT HIP WITH PELVIS COMPARISON:  Jul 24, 2016 FINDINGS: Rods and nails have been placed across the intertrochanteric fracture on the left. Hardware is in good position. There is a distal interlocking screw. IMPRESSION: Left hip fracture repair as above. Electronically Signed   By: Dorise Bullion III M.D   On: 07/24/2016 12:32   Dg Hip Operative Unilat W Or W/o Pelvis Left  Result Date: 07/24/2016 CLINICAL DATA:  Fracture repair in the left hip. FLUOROSCOPY TIME:  55.1 seconds. Images: 4 EXAM: DG C-ARM 61-120 MIN; OPERATIVE LEFT HIP WITH PELVIS COMPARISON:  Jul 24, 2016 FINDINGS: Rods and nails have been placed across the intertrochanteric fracture on the left. Hardware is in good position. There is a distal interlocking screw. IMPRESSION: Left hip fracture repair as above. Electronically Signed   By: Dorise Bullion III M.D   On: 07/24/2016 12:32   Dg Hip Unilat With Pelvis 2-3 Views Left  Result Date:  07/24/2016 CLINICAL DATA:  Unwitnessed fall.  Left hip pain. EXAM: DG HIP (WITH OR WITHOUT PELVIS) 2-3V LEFT COMPARISON:  None. FINDINGS: There is an acute comminuted fracture of the inter trochanteric left hip with varus angulation. Displaced greater trochanteric fragment. No dislocation of the hip. Pelvis and right hip appear intact. Degenerative changes in the lower lumbar spine. Vascular calcifications. IMPRESSION: Acute comminuted inter trochanteric fracture of the left hip with varus angulation. Electronically Signed   By: Lucienne Capers M.D.   On: 07/24/2016 04:54     Not all labs, radiology exams or other studies done during hospitalization come through on my EPIC note; however they are reviewed by me.    Assessment and Plan  L HIP FRACTURE - s/p surgery; prophylaxed with ASA 325 mg BID SNF - admitted for residential care and OT/PT; cont ASA 325 mg BID  ABLA POST-OP - hB 10.4 -> 7,5; PT RECEIVED 2 U prbc; d/c Hb 9 SNF - f/u CBC  ACUTE ON CKD4- baseline Cr 2.5 - 3.5; CR on dmit was 4.5; felt 2/2 being on bactrim and dehydration;bacrim stopped and hydration improved  problem SNF - f/u BMP  BPH SNF-stable; cont flomax and finasteride  DEPRESSION. DEMENTIA SNF- stable; cont mamenda XR 268 mg daily and remeron 30 mg qhs for despression   Time spent > 81min;> 50% of time with patient was spent reviewing records,  labs, tests and studies, counseling and developing plan of care  Noah Delaine. Sheppard Coil, MD

## 2016-07-31 ENCOUNTER — Non-Acute Institutional Stay (SKILLED_NURSING_FACILITY): Payer: Medicare Other | Admitting: Internal Medicine

## 2016-07-31 DIAGNOSIS — F01518 Vascular dementia, unspecified severity, with other behavioral disturbance: Secondary | ICD-10-CM

## 2016-07-31 DIAGNOSIS — D62 Acute posthemorrhagic anemia: Secondary | ICD-10-CM | POA: Diagnosis not present

## 2016-07-31 DIAGNOSIS — N184 Chronic kidney disease, stage 4 (severe): Secondary | ICD-10-CM | POA: Diagnosis not present

## 2016-07-31 DIAGNOSIS — N400 Enlarged prostate without lower urinary tract symptoms: Secondary | ICD-10-CM

## 2016-07-31 DIAGNOSIS — F0151 Vascular dementia with behavioral disturbance: Secondary | ICD-10-CM | POA: Diagnosis not present

## 2016-07-31 DIAGNOSIS — S72002S Fracture of unspecified part of neck of left femur, sequela: Secondary | ICD-10-CM | POA: Diagnosis not present

## 2016-07-31 DIAGNOSIS — N179 Acute kidney failure, unspecified: Secondary | ICD-10-CM

## 2016-07-31 DIAGNOSIS — F320 Major depressive disorder, single episode, mild: Secondary | ICD-10-CM | POA: Diagnosis not present

## 2016-08-02 NOTE — Assessment & Plan Note (Signed)
Hospitalized 07/24/16 to 07/29/16 for left hip fracture sustained from a mechanical fall at [facility]. S/psurgical repair 5/26 by Dr. [name], taking ASA bid for DVT prophylaxis, f/u Dr. [name] in 2 weeks. He is unable to voice pain and request Norco for left hip pain, his POA recommended bid and prn to increase his tolerance of activities related to therapy.

## 2016-08-02 NOTE — Assessment & Plan Note (Signed)
Anemia from CKD and post Op, last Hgb 9.0 07/29/16, update CBC in one week.

## 2016-08-02 NOTE — Assessment & Plan Note (Signed)
Stable, off Omeprazole, Zantac.

## 2016-08-02 NOTE — Assessment & Plan Note (Signed)
Controlled, continue Amlodipine 5mg 

## 2016-08-02 NOTE — Assessment & Plan Note (Signed)
Mood is stable, continue Effexor 75mg  and Mirtazapine 7.5mg  qhs due to relapsed insomnia(off it since 11/18/15).  Sleeps well at night

## 2016-08-02 NOTE — Assessment & Plan Note (Signed)
Stable, continue Senokot S II qhs and MOM qd prn, Doculax suppository prn.

## 2016-08-02 NOTE — Assessment & Plan Note (Signed)
07/29/16 Na 145, K 4.2, Bun 57, creat 3.09, underwent Nephrology evaluation, not an candidate for dialysis. Repeat BMP in one week.

## 2016-08-02 NOTE — Assessment & Plan Note (Signed)
Last urinary retention, Foley inserted 11/04/12 at ED-successfully removed--continued Tamsulosin and Finasteride

## 2016-08-02 NOTE — Assessment & Plan Note (Signed)
Severe kyphosis, only walks a few steps with walker prior to the left hip surgery

## 2016-08-02 NOTE — Assessment & Plan Note (Signed)
Normalized, last Na 145 07/29/16

## 2016-08-02 NOTE — Assessment & Plan Note (Signed)
Persists, continue supportive measures.

## 2016-08-02 NOTE — Assessment & Plan Note (Signed)
Forgetful, continue SNF for care assistance, continue Namenda

## 2016-08-04 ENCOUNTER — Encounter: Payer: Self-pay | Admitting: Family

## 2016-08-04 NOTE — Progress Notes (Signed)
Opened in error

## 2016-08-05 LAB — BASIC METABOLIC PANEL
BUN: 54 — AB (ref 4–21)
Creatinine: 3 — AB (ref 0.6–1.3)
Glucose: 73
POTASSIUM: 5.6 — AB (ref 3.4–5.3)
Sodium: 142 (ref 137–147)

## 2016-08-05 LAB — CBC AND DIFFERENTIAL
HEMATOCRIT: 31 — AB (ref 41–53)
HEMOGLOBIN: 10.1 — AB (ref 13.5–17.5)
Platelets: 303 (ref 150–399)
WBC: 8.6

## 2016-08-06 ENCOUNTER — Encounter: Payer: Self-pay | Admitting: Family

## 2016-08-06 ENCOUNTER — Non-Acute Institutional Stay (SKILLED_NURSING_FACILITY): Payer: Medicare Other | Admitting: Family

## 2016-08-06 DIAGNOSIS — S51812A Laceration without foreign body of left forearm, initial encounter: Secondary | ICD-10-CM | POA: Diagnosis not present

## 2016-08-06 DIAGNOSIS — D631 Anemia in chronic kidney disease: Secondary | ICD-10-CM

## 2016-08-06 DIAGNOSIS — N184 Chronic kidney disease, stage 4 (severe): Secondary | ICD-10-CM

## 2016-08-06 DIAGNOSIS — E875 Hyperkalemia: Secondary | ICD-10-CM | POA: Diagnosis not present

## 2016-08-06 MED ORDER — SODIUM POLYSTYRENE SULFONATE PO POWD: Freq: Once | ORAL | 0 refills | Status: AC

## 2016-08-06 NOTE — Progress Notes (Signed)
Location:  [facility] Room Number: 25 Place of Service:  SNF (31) Provider: Shady Bradish FNP-C  Blanchie Serve, MD  Patient Care Team: Blanchie Serve, MD as PCP - General (Internal Medicine) Mast, Man X, NP as Nurse Practitioner (Internal Medicine) Fleet Contras, MD as Consulting Physician (Nephrology)  Extended Emergency Contact Information Primary Emergency Contact: Phillis Haggis [address] [address] of [place] Phone: [phone] Relation: Nephew Secondary Emergency Contact: Neita Goodnight [place] of [place] Mobile Phone: [phone] Relation: None  Code Status:  DNR Goals of care: Advanced Directive information Advanced Directives 08/06/2016  Does Patient Have a Medical Advance Directive? Yes  Type of Paramedic of [place];Living will;Out of facility DNR (pink MOST or yellow form)  Does patient want to make changes to medical advance directive? -  Copy of [place] in Chart? Yes  Pre-existing out of facility DNR order (yellow form or pink MOST form) Yellow form placed in chart (order not valid for inpatient use)     Chief Complaint  Patient presents with  . Acute Visit    abnormal labs    HPI:  Pt is a 83 y.o. male seen today at [facility] for an acute visit for evaluation of abnormal lab results.He has a medical history of HTN,Parkinsonism,anxiety, Depression, CKD stage 3,CVA, Dementia, GERD among other conditions.He is seen in his room today with Niece and Physical Therapy at bedside. He is status post left hip IM nail in 5/26/ 2018.He denies any acute issues this visit. PT states patient refusing to get out of the bed just want to sleep. He continues to have a poor appetite only drinks supplements. His recent labs results showed Hgb 10.1, HCT 30.8, BUN 54, CR 3.02 ( 08/05/2016). Previous Hgb 9.0, BUN 57. No fever, chills or cough reported.    Past Medical History:    Diagnosis Date  . Anemia   . Anxiety   . Back pain   . Cataract   . Chronic kidney disease 2013   rhabdo  . CKD (chronic kidney disease) stage 3, GFR 30-59 ml/min 08/29/2012   Creatinine 1.88 07/05/13(baseline 1.5-2.0) 02/11/14 creatinine 2.00 05/16/14 creatinine 2.31 07/18/14 creatinine 2.22     . CVA (cerebral infarction)   . Cystic disease of liver   . Dementia with behavioral disturbance 06/06/2012  . Depression 06/06/2012  . Diverticulosis   . Edema   . Gait disorder   . GERD (gastroesophageal reflux disease)   . H/O hiatal hernia 2008   repaired  . Hearing loss   . Hearing loss   . Hypertension   . Hyponatremia   . Inguinal hernia   . Keratosis   . Paresthesia   . Parkinsonism ([place])   . PVC (premature ventricular contraction)   . Sciatica   . Sciatica   . Stroke [facility]) 2010   no deficits  . Thyroid disease   . Vitamin D deficiency disease    Past Surgical History:  Procedure Laterality Date  . APPENDECTOMY    . ESOPHAGOGASTRODUODENOSCOPY  10/25/2011   Procedure: ESOPHAGOGASTRODUODENOSCOPY (EGD);  Surgeon: Winfield Cunas., MD;  Location: [facility] ENDOSCOPY;  Service: Endoscopy;  Laterality: N/A;  . HERNIA REPAIR    . INTRAMEDULLARY (IM) NAIL INTERTROCHANTERIC Left 07/24/2016   Procedure: INTRAMEDULLARY (IM) NAIL INTERTROCHANTRIC;  Surgeon: Nicholes Stairs, MD;  Location: [facility];  Service: Orthopedics;  Laterality: Left;  . PROSTATE SURGERY  cancer    Allergies  Allergen Reactions  . Olanzapine Other (See Comments)    unknown  . Rivastigmine Nausea Only    Allergies as of 08/06/2016      Reactions   Olanzapine Other (See Comments)   unknown   Rivastigmine Nausea Only      Medication List       Accurate as of 08/06/16 12:26 PM. Always use your most recent med list.          acetaminophen 500 MG tablet Commonly known as:  TYLENOL Take 500 mg by mouth at bedtime. At 8PM   acetaminophen 325 MG tablet Commonly known as:  TYLENOL Take 650 mg by mouth  every 4 (four) hours as needed.   amLODipine 5 MG tablet Commonly known as:  NORVASC Take 5 mg by mouth daily.   aspirin EC 81 MG tablet Take 81 mg by mouth daily.   bisacodyl 10 MG suppository Commonly known as:  DULCOLAX Place 10 mg rectally daily as needed for moderate constipation.   finasteride 5 MG tablet Commonly known as:  PROSCAR Take 5 mg by mouth daily.   Fish Oil 1000 MG Caps Take 2,000 mg by mouth daily.   fluticasone 50 MCG/ACT nasal spray Commonly known as:  FLONASE Place 1 spray into both nostrils at bedtime.   HYDROcodone-acetaminophen 5-325 MG tablet Commonly known as:  NORCO/VICODIN Take 1 tablet by mouth 2 (two) times daily. 8AM and 8PM   loteprednol 0.5 % ophthalmic suspension Commonly known as:  LOTEMAX Place 1 drop into the left eye daily. 9:45AM   magnesium hydroxide 400 MG/5ML suspension Commonly known as:  MILK OF MAGNESIA Take 30 mLs by mouth daily.   Melatonin 3 MG Tabs Take 3 mg by mouth at bedtime.   mirtazapine 30 MG tablet Commonly known as:  REMERON Take 30 mg by mouth at bedtime.   NAMENDA XR 28 MG Cp24 24 hr capsule Generic drug:  memantine Take 28 mg by mouth daily.   NUTRITIONAL SUPPLEMENT PO Take 120 mLs by mouth 4 (four) times daily. Resource 2.0 8AM, 1PM, 4PM, 8PM   senna 8.6 MG Tabs tablet Commonly known as:  SENOKOT Take 2 tablets by mouth daily.   sodium chloride 5 % ophthalmic solution Commonly known as:  MURO 128 Place 1 drop into the left eye at bedtime.   sodium chloride 2 % ophthalmic solution Commonly known as:  MURO 128 Place 1 drop into the left eye 3 (three) times daily. 830AM, 1230 PM, 430PM   sodium fluoride 1.1 % Crea dental cream Commonly known as:  PREVIDENT 5000 PLUS Place 1 application onto teeth every evening.   SYSTANE 0.4-0.3 % Gel ophthalmic gel Generic drug:  Polyethyl Glycol-Propyl Glycol Place 1 application into both eyes 3 (three) times daily. 8AM, 12PM, 8PM   tamsulosin 0.4 MG  Caps capsule Commonly known as:  FLOMAX Take 0.4 mg by mouth daily with breakfast.   venlafaxine XR 75 MG 24 hr capsule Commonly known as:  EFFEXOR-XR Take 75 mg by mouth daily with breakfast.   VITAMIN D3 PO Take 1,000 Units by mouth daily.       Review of Systems  Constitutional: Positive for appetite change. Negative for chills, fatigue and fever.  HENT: Positive for hearing loss. Negative for congestion, rhinorrhea, sinus pain, sinus pressure, sneezing and sore throat.   Eyes: Negative.   Respiratory: Negative for cough, chest tightness, shortness of breath and wheezing.   Cardiovascular: Negative for chest pain, palpitations  and leg swelling.  Gastrointestinal: Negative for abdominal distention, abdominal pain, constipation, diarrhea, nausea and vomiting.  Genitourinary: Negative for dysuria, flank pain and urgency.  Musculoskeletal: Positive for gait problem.       Left hip pain under control with current pain medication   Skin: Negative for color change, pallor and rash.       1. Left hip surgical incision  2. Left Forearm skin Tear   Neurological: Negative for dizziness, seizures, light-headedness and headaches.  Hematological: Does not bruise/bleed easily.  Psychiatric/Behavioral: Negative for agitation, confusion, hallucinations and sleep disturbance. The patient is not nervous/anxious.     Immunization History  Administered Date(s) Administered  . DTaP 10/31/2011  . Influenza Whole 12/20/2012  . Influenza-Unspecified 12/06/2013, 12/06/2014, 12/11/2015  . PPD Test 04/24/2011, 05/01/2011  . Pneumococcal-Unspecified 10/31/2011  . Tdap 10/24/2011   Pertinent  Health Maintenance Due  Topic Date Due  . PNA vac Low Risk Adult (2 of 2 - PCV13) 10/30/2012  . INFLUENZA VACCINE  09/29/2016   Fall Risk  01/12/2016 09/12/2014 05/23/2014  Falls in the past year? Yes Yes No  Number falls in past yr: 1 1 -  Injury with Fall? Yes No -  Risk for fall due to : - - History of  fall(s);Impaired balance/gait    Vitals:   08/06/16 1052  BP: 140/84  Pulse: 76  Resp: 15  Temp: (!) 96.8 F (36 C)  SpO2: 99%  Weight: 139 lb 6.4 oz (63.2 kg)  Height: 5' (1.524 m)   Body mass index is 27.22 kg/m. Physical Exam  Constitutional: He is oriented to person, place, and time. He appears well-developed and well-nourished. No distress.  HENT:  Head: Normocephalic.  Mouth/Throat: Oropharynx is clear and moist. No oropharyngeal exudate.  HOH hearing Aids in place  Eyes: Conjunctivae and EOM are normal. Pupils are equal, round, and reactive to light. Right eye exhibits no discharge. Left eye exhibits no discharge. No scleral icterus.  Neck: Normal range of motion. No JVD present. No thyromegaly present.  Cardiovascular: Intact distal pulses.  Exam reveals no gallop and no friction rub.   Murmur heard. Irregular Heart rate   Pulmonary/Chest: Effort normal and breath sounds normal. No respiratory distress. He has no wheezes. He has no rales.  Abdominal: Soft. Bowel sounds are normal. He exhibits no distension. There is no tenderness. There is no rebound and no guarding.  Genitourinary:  Genitourinary Comments: Incontinent   Musculoskeletal: He exhibits no edema, tenderness or deformity.  Left hip limited ROM due to pain on incision site   Lymphadenopathy:    He has no cervical adenopathy.  Neurological: He is oriented to person, place, and time.  Skin: Skin is warm and dry. No rash noted. No erythema. No pallor.  1. Left hip surgical dressing dry, clean and intact. Surrounding skin without any signs of infections.  2. Left forearm skin tear dressing clean and intact surrounding skin tissue with Purple bruise. Bruise also noted on left jaw line.left leg bruise steri strips healing well.   Psychiatric: He has a normal mood and affect.    Labs reviewed:  Recent Labs  07/27/16 0552  07/28/16 0532 07/29/16 07/29/16 0535 08/05/16  NA 144  < > 143 145 145 142  K 4.5  <  > 4.1  --  4.2 5.6*  CL 117*  --  116*  --  119*  --   CO2 20*  --  21*  --  19*  --   GLUCOSE  87  --  80  --  76  --   BUN 65*  < > 61* 57* 57* 54*  CREATININE 4.03*  < > 3.52* 3.1* 3.09* 3.0*  CALCIUM 7.5*  --  7.7*  --  7.8*  --   < > = values in this interval not displayed.  Recent Labs  05/13/16 07/22/16 07/24/16 0526  AST 11* 11* 20  ALT 10 9* 21  ALKPHOS 81 84 95  BILITOT  --   --  0.5  PROT  --   --  8.0  ALBUMIN  --   --  4.1    Recent Labs  07/24/16 0526  07/27/16 0552  07/28/16 0532 07/29/16 07/29/16 0535 08/05/16  WBC 10.2  < > 17.6*  < > 15.0* 9.8 9.8 8.6  NEUTROABS 8.8*  --  15.0*  --   --   --  7.1  --   HGB 10.4*  < > 7.6*  < > 8.7*  --  9.0* 10.1*  HCT 32.1*  < > 23.5*  < > 26.9*  --  27.7* 31*  MCV 91.5  < > 91.1  --  90.0  --  90.2  --   PLT 230  < > 141*  < > 151  --  166 303  < > = values in this interval not displayed. Lab Results  Component Value Date   TSH 0.70 07/22/2016   Lab Results  Component Value Date   HGBA1C 5.3 06/12/2015   Significant Diagnostic Results in last 30 days:  Dg Chest 1 View  Result Date: 07/24/2016 CLINICAL DATA:  Left hip fracture.  History of hypertension. EXAM: CHEST 1 VIEW COMPARISON:  10/24/2011 FINDINGS: Shallow inspiration with linear atelectasis in the lung bases. Heart size and pulmonary vascularity are normal. No consolidation. No blunting of costophrenic angles. No pneumothorax. Calcified and tortuous aorta. Old bilateral rib fractures. IMPRESSION: Shallow inspiration with atelectasis in the lung bases. Electronically Signed   By: Lucienne Capers M.D.   On: 07/24/2016 04:55   Dg Chest Port 1 View  Result Date: 07/26/2016 CLINICAL DATA:  Leukocytosis. EXAM: PORTABLE CHEST 1 VIEW COMPARISON:  07/24/2016 FINDINGS: Cardiac silhouette is normal in size. Aortic atherosclerosis is noted. Lung volumes remain diminished with persistent mild atelectasis in the lung bases. No lobar consolidation, edema, sizable pleural  effusion, or pneumothorax is identified. Multiple old rib fractures are again noted. IMPRESSION: Low lung volumes with mild bibasilar atelectasis. Electronically Signed   By: Logan Bores M.D.   On: 07/26/2016 08:52   Dg Knee Complete 4 Views Left  Result Date: 07/24/2016 CLINICAL DATA:  Knee swelling, pain. EXAM: LEFT KNEE - COMPLETE 4+ VIEW COMPARISON:  None. FINDINGS: No acute fracture deformity or dislocation. Severe osteopenia without destructive bony lesions. No advanced degenerative change for age. Severe vascular calcifications. Soft tissue planes are otherwise unremarkable. IMPRESSION: No acute osseous process. Osteopenia which decreases sensitivity for acute nondisplaced fractures. Electronically Signed   By: Elon Alas M.D.   On: 07/24/2016 04:27   Dg C-arm 1-60 Min  Result Date: 07/24/2016 CLINICAL DATA:  Fracture repair in the left hip. FLUOROSCOPY TIME:  55.1 seconds. Images: 4 EXAM: DG C-ARM 61-120 MIN; OPERATIVE LEFT HIP WITH PELVIS COMPARISON:  Jul 24, 2016 FINDINGS: Rods and nails have been placed across the intertrochanteric fracture on the left. Hardware is in good position. There is a distal interlocking screw. IMPRESSION: Left hip fracture repair as above. Electronically Signed   By: Shanon Brow  Mee Hives M.D   On: 07/24/2016 12:32   Dg Hip Operative Unilat W Or W/o Pelvis Left  Result Date: 07/24/2016 CLINICAL DATA:  Fracture repair in the left hip. FLUOROSCOPY TIME:  55.1 seconds. Images: 4 EXAM: DG C-ARM 61-120 MIN; OPERATIVE LEFT HIP WITH PELVIS COMPARISON:  Jul 24, 2016 FINDINGS: Rods and nails have been placed across the intertrochanteric fracture on the left. Hardware is in good position. There is a distal interlocking screw. IMPRESSION: Left hip fracture repair as above. Electronically Signed   By: Dorise Bullion III M.D   On: 07/24/2016 12:32   Dg Hip Unilat With Pelvis 2-3 Views Left  Result Date: 07/24/2016 CLINICAL DATA:  Unwitnessed fall.  Left hip pain. EXAM:  DG HIP (WITH OR WITHOUT PELVIS) 2-3V LEFT COMPARISON:  None. FINDINGS: There is an acute comminuted fracture of the inter trochanteric left hip with varus angulation. Displaced greater trochanteric fragment. No dislocation of the hip. Pelvis and right hip appear intact. Degenerative changes in the lower lumbar spine. Vascular calcifications. IMPRESSION: Acute comminuted inter trochanteric fracture of the left hip with varus angulation. Electronically Signed   By: Lucienne Capers M.D.   On: 07/24/2016 04:54   Assessment/Plan 1. Hyperkalemia K+ 5.6 ( 08/05/2016).   2. Anemia in stage 4 chronic kidney disease   Hgb 10.1, HCT 30.8 (08/05/2016) Hgb has improved  previous Hgb 9.0. Continue to monitor H/H.   3. CKD (chronic kidney disease) stage 4, GFR 15-29 ml/min (HCC) BUN 54, CR 3.02 ( 08/05/2016). Previous BUN 57, CR 3.09.seems to be at baseline. Continue to encourage fluid intake given poor oral intake. Monitor BMP.   4. Skin tear of left forearm without complication Afebrile. No signs of infections. Continue current dressing changes.   Family/ staff Communication: Reviewed plan of care with patient and facility Nurse supervisor  Labs/tests ordered: None   Sandrea Hughs, NP

## 2016-08-07 ENCOUNTER — Encounter: Payer: Self-pay | Admitting: Internal Medicine

## 2016-08-07 DIAGNOSIS — D62 Acute posthemorrhagic anemia: Secondary | ICD-10-CM | POA: Insufficient documentation

## 2016-08-09 LAB — BASIC METABOLIC PANEL
BUN: 56 — AB (ref 4–21)
Creatinine: 3.6 — AB (ref 0.6–1.3)
GLUCOSE: 73
Potassium: 5 (ref 3.4–5.3)
Sodium: 140 (ref 137–147)

## 2016-08-10 ENCOUNTER — Non-Acute Institutional Stay (SKILLED_NURSING_FACILITY): Payer: Medicare Other | Admitting: Family

## 2016-08-10 ENCOUNTER — Encounter: Payer: Self-pay | Admitting: Family

## 2016-08-10 DIAGNOSIS — Y92129 Unspecified place in nursing home as the place of occurrence of the external cause: Secondary | ICD-10-CM | POA: Diagnosis not present

## 2016-08-10 DIAGNOSIS — M810 Age-related osteoporosis without current pathological fracture: Secondary | ICD-10-CM

## 2016-08-10 DIAGNOSIS — R2681 Unsteadiness on feet: Secondary | ICD-10-CM

## 2016-08-10 DIAGNOSIS — I10 Essential (primary) hypertension: Secondary | ICD-10-CM

## 2016-08-10 DIAGNOSIS — W19XXXA Unspecified fall, initial encounter: Secondary | ICD-10-CM | POA: Diagnosis not present

## 2016-08-10 DIAGNOSIS — S51812D Laceration without foreign body of left forearm, subsequent encounter: Secondary | ICD-10-CM | POA: Diagnosis not present

## 2016-08-10 MED ORDER — CALCIUM CARBONATE-VITAMIN D 500-200 MG-UNIT PO TABS: 1.0000 | ORAL_TABLET | Freq: Every day | ORAL | Status: DC

## 2016-08-10 MED ORDER — AMLODIPINE BESYLATE 10 MG PO TABS: 10.0000 mg | ORAL_TABLET | Freq: Every day | ORAL | Status: DC

## 2016-08-10 NOTE — Progress Notes (Signed)
Location:  [facility] Room Number: 25 Place of Service:  SNF (31) Provider: Dinah Ngetich FNP-C  Blanchie Serve, MD  Patient Care Team: Blanchie Serve, MD as PCP - General (Internal Medicine) Mast, Man X, NP as Nurse Practitioner (Internal Medicine) Fleet Contras, MD as Consulting Physician (Nephrology)  Extended Emergency Contact Information Primary Emergency Contact: Phillis Haggis [address] [address]itter of [place] Phone: [phone] Relation: Nephew Secondary Emergency Contact: Neita Goodnight [place] of [place] Mobile Phone: [phone] Relation: None  Code Status:  DNR Goals of care: Advanced Directive information Advanced Directives 08/10/2016  Does Patient Have a Medical Advance Directive? Yes  Type of Paramedic of [place];Living will;Out of facility DNR (pink MOST or yellow form)  Does patient want to make changes to medical advance directive? -  Copy of [place] in Chart? Yes  Pre-existing out of facility DNR order (yellow form or pink MOST form) Yellow form placed in chart (order not valid for inpatient use);Pink MOST form placed in chart (order not valid for inpatient use)     Chief Complaint  Patient presents with  . Acute Visit    Fall    HPI:  Pt is a 83 y.o. male seen today at [facility] for an acute visit for evaluation of fall. He is seen in his room today per facility Nurse request. Facility nurse reports patient was found sitting on the floor 08/09/2016. He sustained a left elbow skin tear. He also complained of left arm and leg pain.Dr. Bubba Camp was notified. X-ray of left Humerus,tibia/fibula and femur were ordered.He continues to complain of left arm and left leg pain.He is currently on Norco 5/325 mg Tablet twice daily.His left humerus,tibia/fibula and humerus X-ray was negative for fracture.X-ray also showed bony structure osteoporotic.He denies any  fever, chills or cough.Facility B/P log reviewed showed B/p ranging in the 140's/70's-160's/80's.   Past Medical History:  Diagnosis Date  . Anemia   . Anxiety   . Back pain   . Cataract   . Chronic kidney disease 2013   rhabdo  . CKD (chronic kidney disease) stage 3, GFR 30-59 ml/min 08/29/2012   Creatinine 1.88 07/05/13(baseline 1.5-2.0) 02/11/14 creatinine 2.00 05/16/14 creatinine 2.31 07/18/14 creatinine 2.22     . CVA (cerebral infarction)   . Cystic disease of liver   . Dementia with behavioral disturbance 06/06/2012  . Depression 06/06/2012  . Diverticulosis   . Edema   . Gait disorder   . GERD (gastroesophageal reflux disease)   . H/O hiatal hernia 2008   repaired  . Hearing loss   . Hearing loss   . Hypertension   . Hyponatremia   . Inguinal hernia   . Keratosis   . Paresthesia   . Parkinsonism ([place])   . PVC (premature ventricular contraction)   . Sciatica   . Sciatica   . Stroke [facility]) 2010   no deficits  . Thyroid disease   . Vitamin D deficiency disease    Past Surgical History:  Procedure Laterality Date  . APPENDECTOMY    . ESOPHAGOGASTRODUODENOSCOPY  10/25/2011   Procedure: ESOPHAGOGASTRODUODENOSCOPY (EGD);  Surgeon: Winfield Cunas., MD;  Location: [facility] ENDOSCOPY;  Service: Endoscopy;  Laterality: N/A;  . HERNIA REPAIR    . INTRAMEDULLARY (IM) NAIL INTERTROCHANTERIC Left 07/24/2016   Procedure: INTRAMEDULLARY (IM) NAIL INTERTROCHANTRIC;  Surgeon: Nicholes Stairs, MD;  Location: [facility];  Service: Orthopedics;  Laterality: Left;  . PROSTATE SURGERY     cancer    Allergies  Allergen Reactions  . Olanzapine Other (See Comments)    unknown  . Rivastigmine Nausea Only    Allergies as of 08/10/2016      Reactions   Olanzapine Other (See Comments)   unknown   Rivastigmine Nausea Only      Medication List       Accurate as of 08/10/16 11:41 AM. Always use your most recent med list.          acetaminophen 500 MG tablet Commonly known as:   TYLENOL Take 500 mg by mouth at bedtime. At 8PM   acetaminophen 325 MG tablet Commonly known as:  TYLENOL Take 650 mg by mouth every 4 (four) hours as needed.   amLODipine 5 MG tablet Commonly known as:  NORVASC Take 5 mg by mouth daily.   aspirin EC 81 MG tablet Take 81 mg by mouth daily.   bisacodyl 10 MG suppository Commonly known as:  DULCOLAX Place 10 mg rectally daily as needed for moderate constipation.   finasteride 5 MG tablet Commonly known as:  PROSCAR Take 5 mg by mouth daily.   Fish Oil 1000 MG Caps Take 2,000 mg by mouth daily.   fluticasone 50 MCG/ACT nasal spray Commonly known as:  FLONASE Place 1 spray into both nostrils at bedtime.   HYDROcodone-acetaminophen 5-325 MG tablet Commonly known as:  NORCO/VICODIN Take 1 tablet by mouth 2 (two) times daily. 8AM and 8PM   loteprednol 0.5 % ophthalmic suspension Commonly known as:  LOTEMAX Place 1 drop into the left eye daily. 9:45AM   magnesium hydroxide 400 MG/5ML suspension Commonly known as:  MILK OF MAGNESIA Take 30 mLs by mouth daily.   Melatonin 3 MG Tabs Take 3 mg by mouth at bedtime.   mirtazapine 30 MG tablet Commonly known as:  REMERON Take 30 mg by mouth at bedtime.   NAMENDA XR 28 MG Cp24 24 hr capsule Generic drug:  memantine Take 28 mg by mouth daily.   NUTRITIONAL SUPPLEMENT PO Take 120 mLs by mouth 4 (four) times daily. Resource 2.0 8AM, 1PM, 4PM, 8PM   OXYGEN Inhale 1-2 L/min into the lungs continuous. To maintain O2 sats >90%   senna 8.6 MG Tabs tablet Commonly known as:  SENOKOT Take 2 tablets by mouth daily.   sodium chloride 5 % ophthalmic solution Commonly known as:  MURO 128 Place 1 drop into the left eye at bedtime.   sodium chloride 2 % ophthalmic solution Commonly known as:  MURO 128 Place 1 drop into the left eye 3 (three) times daily. 830AM, 1230 PM, 430PM   sodium fluoride 1.1 % Crea dental cream Commonly known as:  PREVIDENT 5000 PLUS Place 1 application  onto teeth every evening.   SYSTANE 0.4-0.3 % Gel ophthalmic gel Generic drug:  Polyethyl Glycol-Propyl Glycol Place 1 application into both eyes 3 (three) times daily. 8AM, 12PM, 8PM   tamsulosin 0.4 MG Caps capsule Commonly known as:  FLOMAX Take 0.4 mg by mouth daily with breakfast.   venlafaxine XR 75 MG 24 hr capsule Commonly known as:  EFFEXOR-XR Take 75 mg by mouth daily with breakfast.   VITAMIN D3 PO Take 1,000 Units by mouth daily.       Review of Systems  Constitutional: Positive for appetite change. Negative for chills, fatigue and fever.  HENT: Positive for hearing loss. Negative for congestion, rhinorrhea, sinus pain, sinus pressure, sneezing and sore throat.  Eyes: Negative.   Respiratory: Negative for cough, chest tightness, shortness of breath and wheezing.   Cardiovascular: Negative for chest pain, palpitations and leg swelling.  Gastrointestinal: Negative for abdominal distention, abdominal pain, constipation, diarrhea, nausea and vomiting.  Genitourinary: Negative for dysuria, flank pain, frequency and urgency.  Musculoskeletal: Positive for gait problem.       Left arm and left hip pain   Skin: Negative for color change, pallor and rash.        Left Forearm old skin Tear   Neurological: Negative for dizziness, seizures, light-headedness and headaches.  Hematological: Does not bruise/bleed easily.  Psychiatric/Behavioral: Negative for agitation, confusion, hallucinations and sleep disturbance. The patient is not nervous/anxious.     Immunization History  Administered Date(s) Administered  . DTaP 10/31/2011  . Influenza Whole 12/20/2012  . Influenza-Unspecified 12/06/2013, 12/06/2014, 12/11/2015  . PPD Test 04/24/2011, 05/01/2011  . Pneumococcal-Unspecified 10/31/2011  . Tdap 10/24/2011   Pertinent  Health Maintenance Due  Topic Date Due  . PNA vac Low Risk Adult (2 of 2 - PCV13) 10/30/2012  . INFLUENZA VACCINE  09/29/2016   Fall Risk  08/10/2016  01/12/2016 09/12/2014 05/23/2014  Falls in the past year? Yes Yes Yes No  Number falls in past yr: 2 or more 1 1 -  Injury with Fall? Yes Yes No -  Risk Factor Category  High Fall Risk - - -  Risk for fall due to : History of fall(s);Impaired balance/gait;Impaired mobility - - History of fall(s);Impaired balance/gait    Vitals:   08/10/16 1056  BP: (!) 147/93  Pulse: 67  Resp: 14  Temp: 97 F (36.1 C)  SpO2: 97%  Weight: 128 lb 3.2 oz (58.2 kg)  Height: 5' (1.524 m)   Body mass index is 25.04 kg/m. Physical Exam  Constitutional: He is oriented to person, place, and time. He appears well-developed and well-nourished. No distress.  HENT:  Head: Normocephalic.  Mouth/Throat: Oropharynx is clear and moist. No oropharyngeal exudate.  HOH hearing Aids in place  Eyes: Conjunctivae and EOM are normal. Pupils are equal, round, and reactive to light. Right eye exhibits no discharge. Left eye exhibits no discharge. No scleral icterus.  Neck: Normal range of motion. No JVD present. No thyromegaly present.  Cardiovascular: Intact distal pulses.  Exam reveals no gallop and no friction rub.   Murmur heard. Irregular Heart rate   Pulmonary/Chest: Effort normal and breath sounds normal. No respiratory distress. He has no wheezes. He has no rales.  Abdominal: Soft. Bowel sounds are normal. He exhibits no distension. There is no tenderness. There is no rebound and no guarding.  Genitourinary:  Genitourinary Comments: Incontinent   Musculoskeletal: He exhibits no edema, tenderness or deformity.  Unsteady gait. Guarding left arm and left leg due to pain.   Lymphadenopathy:    He has no cervical adenopathy.  Neurological: He is oriented to person, place, and time.  Skin: Skin is warm and dry. No rash noted. No erythema. No pallor.   Left forearm old skin tear progressive healing. Surrounding skin tissue bruise. left leg skin tear steri strips dry and progressive healing noted.    Psychiatric: He  has a normal mood and affect.    Labs reviewed:  Recent Labs  07/27/16 0552  07/28/16 0532 07/29/16 07/29/16 0535 08/05/16  NA 144  < > 143 145 145 142  K 4.5  < > 4.1  --  4.2 5.6*  CL 117*  --  116*  --  119*  --  CO2 20*  --  21*  --  19*  --   GLUCOSE 87  --  80  --  76  --   BUN 65*  < > 61* 57* 57* 54*  CREATININE 4.03*  < > 3.52* 3.1* 3.09* 3.0*  CALCIUM 7.5*  --  7.7*  --  7.8*  --   < > = values in this interval not displayed.  Recent Labs  05/13/16 07/22/16 07/24/16 0526  AST 11* 11* 20  ALT 10 9* 21  ALKPHOS 81 84 95  BILITOT  --   --  0.5  PROT  --   --  8.0  ALBUMIN  --   --  4.1    Recent Labs  07/24/16 0526  07/27/16 0552  07/28/16 0532 07/29/16 07/29/16 0535 08/05/16  WBC 10.2  < > 17.6*  < > 15.0* 9.8 9.8 8.6  NEUTROABS 8.8*  --  15.0*  --   --   --  7.1  --   HGB 10.4*  < > 7.6*  < > 8.7*  --  9.0* 10.1*  HCT 32.1*  < > 23.5*  < > 26.9*  --  27.7* 31*  MCV 91.5  < > 91.1  --  90.0  --  90.2  --   PLT 230  < > 141*  < > 151  --  166 303  < > = values in this interval not displayed. Lab Results  Component Value Date   TSH 0.70 07/22/2016   Lab Results  Component Value Date   HGBA1C 5.3 06/12/2015   Significant Diagnostic Results in last 30 days:  2 views left Humerus 08/09/2016: Impression: No evidence of fracture. Bony structure is osteoporotic.   2 views Left Forearm 08/09/2016 :  Impression: Bony structure is osteoporotic. No evidence of fracture.  2 views left Femur 08/09/2016 : Impression: Internal Fixation of the left hip. Healing intertrochanteric fracture.   2 Views of Tibia/Fibula 08/09/2016 :  Impression: Fixation screws in the distal fibula with mild associated deformity consistent with an old healed fracture. Bony structure is osteoporotic.    Assessment/Plan 1. Unsteady Gait Status post left hip IM nail 07/24/2016. Continue to work with PT/OT for ROM, exercise, gait stability and muscle strengthening.Continue fall and  safety precautions. Encourage hydration.   2. Fall encounter  Facility nurse reports patient was found sitting on the floor 08/09/2016. He sustained a left elbow skin tear, complained of left arm and leg pain.Dr. Bubba Camp was notified. X-ray of left Humerus,tibia/fibula and femur were ordered. left humerus,tibia/fibula and humerus X-ray was negative for fracture.X-ray also showed bony structure osteoporotic.Continue on Norco 5/325 mg Tablet twice daily pain.Add Calcium-Vit D 500-200 mg-units one by mouth daily with breakfast.   3. HTN  B/P log reviewed showed B/p ranging in the 140's/70's-160's/80's.Increase Amlodipine to 10 mg tablet daily. Monitor B/p every shift.   4. Osteoporosis    Per X-ray 08/09/2016 bony structure osteoporotic noted. Start calcium- Vit D supplements as above. Continue to monitor.   5. Skin tear of left forearm without complication Afebrile. Progressive healing.continue with dressing changes.Continue to monitor   Family/ staff Communication: Reviewed plan of care with patient and facility Nurse supervisor  Labs/tests ordered: None   Sandrea Hughs, NP

## 2016-08-13 ENCOUNTER — Encounter: Payer: Self-pay | Admitting: Internal Medicine

## 2016-08-13 ENCOUNTER — Non-Acute Institutional Stay (SKILLED_NURSING_FACILITY): Payer: Medicare Other | Admitting: Internal Medicine

## 2016-08-13 DIAGNOSIS — N184 Chronic kidney disease, stage 4 (severe): Secondary | ICD-10-CM | POA: Diagnosis not present

## 2016-08-13 DIAGNOSIS — F321 Major depressive disorder, single episode, moderate: Secondary | ICD-10-CM | POA: Diagnosis not present

## 2016-08-13 DIAGNOSIS — E440 Moderate protein-calorie malnutrition: Secondary | ICD-10-CM | POA: Diagnosis not present

## 2016-08-13 DIAGNOSIS — S72002S Fracture of unspecified part of neck of left femur, sequela: Secondary | ICD-10-CM | POA: Diagnosis not present

## 2016-08-13 DIAGNOSIS — N179 Acute kidney failure, unspecified: Secondary | ICD-10-CM | POA: Diagnosis not present

## 2016-08-13 DIAGNOSIS — R638 Other symptoms and signs concerning food and fluid intake: Secondary | ICD-10-CM

## 2016-08-13 DIAGNOSIS — F0151 Vascular dementia with behavioral disturbance: Secondary | ICD-10-CM | POA: Diagnosis not present

## 2016-08-13 DIAGNOSIS — R634 Abnormal weight loss: Secondary | ICD-10-CM

## 2016-08-13 DIAGNOSIS — F01518 Vascular dementia, unspecified severity, with other behavioral disturbance: Secondary | ICD-10-CM

## 2016-08-13 NOTE — Progress Notes (Signed)
Location:  [facility] Room Number: 25 Place of Service:  SNF [phone]) Provider:    Blanchie Serve, MD  Patient Care Team: Blanchie Serve, MD as PCP - General (Internal Medicine) Fleet Contras, MD as Consulting Physician (Nephrology) Ngetich, Nelda Bucks, NP as Nurse Practitioner (Family Medicine)  Extended Emergency Contact Information Primary Emergency Contact: Phillis Haggis [address] [address] Phone: [phone] Relation: Nephew Secondary Emergency Contact: Neita Goodnight [place] Mobile Phone: [phone] Relation: None  Code Status:  DNR  Goals of care: Advanced Directive information Advanced Directives 08/13/2016  Does Patient Have a Medical Advance Directive? Yes  Type of Advance Directive Out of facility DNR (pink MOST or yellow form);[place];Living will  Does patient want to make changes to medical advance directive? -  Copy of [place] in Chart? Yes  Pre-existing out of facility DNR order (yellow form or pink MOST form) Yellow form placed in chart (order not valid for inpatient use);Pink MOST form placed in chart (order not valid for inpatient use)     Chief Complaint  Patient presents with  . Acute Visit    weight loss    HPI:  Patient is a 83 y.o. Potts seen today for an acute visit for weight loss. Per nursing, he has poor oral intake. He drink his protein supplements. He has been refusing his medications at times. He is working with therapy team and according to them his participation has declined rapidly over last few days. He has advanced dementia and does not participate in HPI/ROS. Per nursing, no fever or pressure ulcer reported. He had a fall on 08/09/16- this was unwitnessed. His xray has been negative for fracture. Of note, he recently sustained left hip fracture and underwent surgical repair with IM nail on 07/24/16.    Past Medical History:    Diagnosis Date  . Anemia   . Anxiety   . Back pain   . Cataract   . Chronic kidney disease 2013   rhabdo  . CKD (chronic kidney disease) stage 3, GFR 30-59 ml/min 08/29/2012   Creatinine 1.88 07/05/13(baseline 1.5-2.0) Vincent/14/15 creatinine 2.00 05/16/14 creatinine 2.31 07/18/14 creatinine 2.22     . CVA (cerebral infarction)   . Cystic disease of liver   . Dementia with behavioral disturbance 06/06/2012  . Depression 06/06/2012  . Diverticulosis   . Edema   . Gait disorder   . GERD (gastroesophageal reflux disease)   . H/O hiatal hernia 2008   repaired  . Hearing loss   . Hearing loss   . Hypertension   . Hyponatremia   . Inguinal hernia   . Keratosis   . Paresthesia   . Parkinsonism ([place])   . PVC (premature ventricular contraction)   . Sciatica   . Sciatica   . Stroke [facility]) 2010   no deficits  . Thyroid disease   . Vitamin D deficiency disease    Past Surgical History:  Procedure Laterality Date  . APPENDECTOMY    . ESOPHAGOGASTRODUODENOSCOPY  10/25/2011   Procedure: ESOPHAGOGASTRODUODENOSCOPY (EGD);  Surgeon: Winfield Cunas., MD;  Location: [facility] ENDOSCOPY;  Service: Endoscopy;  Laterality: N/A;  . HERNIA REPAIR    . INTRAMEDULLARY (IM) NAIL INTERTROCHANTERIC Left 07/24/2016   Procedure: INTRAMEDULLARY (IM) NAIL INTERTROCHANTRIC;  Surgeon: Nicholes Stairs, MD;  Location: [facility];  Service: Orthopedics;  Laterality: Left;  . PROSTATE SURGERY  cancer    Allergies  Allergen Reactions  . Olanzapine Other (See Comments)    unknown  . Rivastigmine Nausea Only    Outpatient Encounter Prescriptions as of 08/13/2016  Medication Sig  . acetaminophen (TYLENOL) 325 MG tablet Take 650 mg by mouth every 4 (four) hours as needed.  Marland Kitchen acetaminophen (TYLENOL) 500 MG tablet Take 500 mg by mouth at bedtime. At 8PM  . amLODipine (NORVASC) 10 MG tablet Take 1 tablet (10 mg total) by mouth daily.  Marland Kitchen aspirin EC 81 MG tablet Take 81 mg by mouth 2 (two) times daily.   . bisacodyl  (DULCOLAX) 10 MG suppository Place 10 mg rectally daily as needed for moderate constipation.  . calcium-vitamin D (OSCAL WITH D) 500-200 MG-UNIT tablet Take 1 tablet by mouth daily with breakfast.  . Cholecalciferol (VITAMIN D3 PO) Take 1,000 Units by mouth daily.  . finasteride (PROSCAR) 5 MG tablet Take 5 mg by mouth daily.  . fluticasone (FLONASE) 50 MCG/ACT nasal spray Place 1 spray into both nostrils at bedtime.  Marland Kitchen HYDROcodone-acetaminophen (NORCO/VICODIN) 5-325 MG tablet Take 1 tablet by mouth 2 (two) times daily. 8AM and 8PM  . loteprednol (LOTEMAX) 0.5 % ophthalmic suspension Place 1 drop into the left eye daily. 9:45AM  . magnesium hydroxide (MILK OF MAGNESIA) 400 MG/5ML suspension Take 30 mLs by mouth daily.  . Melatonin 3 MG TABS Take 3 mg by mouth at bedtime.  . memantine (NAMENDA XR) 28 MG CP24 24 hr capsule Take 28 mg by mouth daily.  . mirtazapine (REMERON) 30 MG tablet Take 30 mg by mouth at bedtime.  . Nutritional Supplements (NUTRITIONAL SUPPLEMENT PO) Take 120 mLs by mouth 4 (four) times daily. Resource 2.0 8AM, 1PM, 4PM, 8PM  . Omega-3 Fatty Acids (FISH OIL) 1000 MG CAPS Take 2,000 mg by mouth daily.  . OXYGEN Inhale 1-2 L/min into the lungs continuous. To maintain O2 sats >90%  . Polyethyl Glycol-Propyl Glycol (SYSTANE) 0.4-0.3 % GEL ophthalmic gel Place 1 application into both eyes 3 (three) times daily. 8AM, 12PM, 8PM  . senna (SENOKOT) 8.6 MG TABS tablet Take 2 tablets by mouth daily.  . sodium chloride (MURO 128) 2 % ophthalmic solution Place 1 drop into the left eye 3 (three) times daily. 830AM, 1230 PM, 430PM  . sodium chloride (MURO 128) 5 % ophthalmic solution Place 1 drop into the left eye at bedtime.  . sodium fluoride (PREVIDENT 5000 PLUS) 1.1 % CREA dental cream Place 1 application onto teeth every evening.  . tamsulosin (FLOMAX) 0.4 MG CAPS capsule Take 0.4 mg by mouth daily with breakfast.  . venlafaxine XR (EFFEXOR-XR) 75 MG 24 hr capsule Take 75 mg by mouth  daily with breakfast.  . [DISCONTINUED] calcium-vitamin D (OSCAL WITH D) 500-200 MG-UNIT per tablet 1 tablet    No facility-administered encounter medications on file as of 08/13/2016.     Review of Systems  Unable to perform ROS: Dementia    Immunization History  Administered Date(s) Administered  . DTaP 10/31/2011  . Influenza Whole 12/20/2012  . Influenza-Unspecified 12/06/2013, 12/06/2014, 10/Vincent/2017  . PPD Test 04/24/2011, 05/01/2011  . Pneumococcal-Unspecified 10/31/2011  . Tdap 10/24/2011   Pertinent  Health Maintenance Due  Topic Date Due  . PNA vac Low Risk Adult (2 of 2 - PCV13) 10/30/2012  . INFLUENZA VACCINE  09/29/2016   Fall Risk  6/Vincent/2018 01/12/2016 09/12/2014 05/23/2014  Falls in the past year? Yes Yes Yes No  Number falls in past yr: 2 or more 1  1 -  Injury with Fall? Yes Yes No -  Risk Factor Category  High Fall Risk - - -  Risk for fall due to : History of fall(s);Impaired balance/gait;Impaired mobility - - History of fall(s);Impaired balance/gait   Functional Status Survey:    Vitals:   08/13/16 1008  BP: (!) 138/92  Pulse: 92  Resp: 18  Temp: 97 F (36.1 C)  SpO2: 93%  Weight: 128 lb 3.2 oz (58.2 kg)  Height: 5' (1.524 m)   Body mass index is 25.04 kg/m. Physical Exam  Constitutional:  Frail, thin built Potts, in no acute distress  HENT:  Head: Normocephalic and atraumatic.  Mouth/Throat: Oropharynx is clear and moist.  Eyes: Conjunctivae are normal. Pupils are equal, round, and reactive to light.  Neck: Normal range of motion. Neck supple.  Cardiovascular: Normal rate and regular rhythm.   Pulmonary/Chest: Effort normal and breath sounds normal. He has no wheezes. He has no rales.  Abdominal: Soft. Bowel sounds are normal. He exhibits no distension. There is no tenderness.  Musculoskeletal: He exhibits no edema.  generalized weakness, more to lower extremities left > right, limited ROM left hip with pain- passive movement. External  rotation of left lower extremity. Has heel floaters. Left arm movement limited at his elbow with pain, dressing in place  Lymphadenopathy:    He has no cervical adenopathy.  Neurological: He is alert.  Not participating in conversation  Skin: Skin is warm and dry.  Easy bruising  Psychiatric:  Flat affect, poor eye contact    Labs reviewed:  Recent Labs  07/27/16 0552  07/28/16 0532  07/29/16 0535 08/05/16 08/09/16  NA 144  < > 143  < > 145 142 140  K 4.5  < > 4.1  --  4.2 5.6* 5.0  CL 117*  --  116*  --  119*  --   --   CO2 20*  --  21*  --  19*  --   --   GLUCOSE 87  --  80  --  76  --   --   BUN 65*  < > 61*  < > 57* 54* 56*  CREATININE 4.03*  < > 3.52*  < > 3.09* 3.0* 3.6*  CALCIUM 7.5*  --  7.7*  --  7.8*  --   --   < > = values in this interval not displayed.  Recent Labs  05/13/16 07/22/16 07/24/16 0526  AST 11* 11* 20  ALT 10 9* 21  ALKPHOS 81 84 95  BILITOT  --   --  0.5  PROT  --   --  8.0  ALBUMIN  --   --  4.1    Recent Labs  07/24/16 0526  07/27/16 0552  07/28/16 0532 07/29/16 07/29/16 0535 08/05/16  WBC 10.2  < > 17.6*  < > 15.0* 9.8 9.8 8.6  NEUTROABS 8.8*  --  15.0*  --   --   --  7.1  --   HGB 10.4*  < > 7.6*  < > 8.7*  --  9.0* 10.1*  HCT 32.1*  < > 23.5*  < > 26.9*  --  27.7* 31*  MCV 91.5  < > 91.1  --  90.0  --  90.2  --   PLT 230  < > 141*  < > 151  --  166 303  < > = values in this interval not displayed. Lab Results  Component Value Date   TSH 0.70  07/22/2016   Lab Results  Component Value Date   HGBA1C 5.3 06/12/2015   No results found for: CHOL, HDL, LDLCALC, LDLDIRECT, TRIG, CHOLHDL  Significant Diagnostic Results in last 30 days:  Dg Chest 1 View  Result Date: 07/24/2016 CLINICAL DATA:  Left hip fracture.  History of hypertension. EXAM: CHEST 1 VIEW COMPARISON:  10/24/2011 FINDINGS: Shallow inspiration with linear atelectasis in the lung bases. Heart size and pulmonary vascularity are normal. No consolidation. No blunting of  costophrenic angles. No pneumothorax. Calcified and tortuous aorta. Old bilateral rib fractures. IMPRESSION: Shallow inspiration with atelectasis in the lung bases. Electronically Signed   By: Lucienne Capers M.D.   On: 07/24/2016 04:55   Dg Chest Port 1 View  Result Date: 07/26/2016 CLINICAL DATA:  Leukocytosis. EXAM: PORTABLE CHEST 1 VIEW COMPARISON:  07/24/2016 FINDINGS: Cardiac silhouette is normal in size. Aortic atherosclerosis is noted. Lung volumes remain diminished with persistent mild atelectasis in the lung bases. No lobar consolidation, edema, sizable pleural effusion, or pneumothorax is identified. Multiple old rib fractures are again noted. IMPRESSION: Low lung volumes with mild bibasilar atelectasis. Electronically Signed   By: Logan Bores M.D.   On: 07/26/2016 08:52   Dg Knee Complete 4 Views Left  Result Date: 07/24/2016 CLINICAL DATA:  Knee swelling, pain. EXAM: LEFT KNEE - COMPLETE 4+ VIEW COMPARISON:  None. FINDINGS: No acute fracture deformity or dislocation. Severe osteopenia without destructive bony lesions. No advanced degenerative change for age. Severe vascular calcifications. Soft tissue planes are otherwise unremarkable. IMPRESSION: No acute osseous process. Osteopenia which decreases sensitivity for acute nondisplaced fractures. Electronically Signed   By: Elon Alas M.D.   On: 07/24/2016 04:27   Dg C-arm 1-60 Min  Result Date: 07/24/2016 CLINICAL DATA:  Fracture repair in the left hip. FLUOROSCOPY TIME:  55.1 seconds. Images: 4 EXAM: DG C-ARM 61-120 MIN; OPERATIVE LEFT HIP WITH PELVIS COMPARISON:  Jul 24, 2016 FINDINGS: Rods and nails have been placed across the intertrochanteric fracture on the left. Hardware is in good position. There is a distal interlocking screw. IMPRESSION: Left hip fracture repair as above. Electronically Signed   By: Dorise Bullion III M.D   On: 07/24/2016 Vincent:32   Dg Hip Operative Unilat W Or W/o Pelvis Left  Result Date:  07/24/2016 CLINICAL DATA:  Fracture repair in the left hip. FLUOROSCOPY TIME:  55.1 seconds. Images: 4 EXAM: DG C-ARM 61-120 MIN; OPERATIVE LEFT HIP WITH PELVIS COMPARISON:  Jul 24, 2016 FINDINGS: Rods and nails have been placed across the intertrochanteric fracture on the left. Hardware is in good position. There is a distal interlocking screw. IMPRESSION: Left hip fracture repair as above. Electronically Signed   By: Dorise Bullion III M.D   On: 07/24/2016 Vincent:32   Dg Hip Unilat With Pelvis 2-3 Views Left  Result Date: 07/24/2016 CLINICAL DATA:  Unwitnessed fall.  Left hip pain. EXAM: DG HIP (WITH OR WITHOUT PELVIS) 2-3V LEFT COMPARISON:  None. FINDINGS: There is an acute comminuted fracture of the inter trochanteric left hip with varus angulation. Displaced greater trochanteric fragment. No dislocation of the hip. Pelvis and right hip appear intact. Degenerative changes in the lower lumbar spine. Vascular calcifications. IMPRESSION: Acute comminuted inter trochanteric fracture of the left hip with varus angulation. Electronically Signed   By: Lucienne Capers M.D.   On: 07/24/2016 04:54    Assessment/Plan  Weight loss With decreased oral intake. His recent fall, left hip fracture s/p surgery, advanced dementia and depression could all be contributing to this.  He is currently on nutrition supplements and remeron 30 mg daily. Appears to have failure to thrive, will get palliative care consult to further assess his goals of care.  Decreased oral intake Encourage po intake. Already on appetite stimulant and nutritional supplements. Reviewed labs from 08/09/16 negative for infection. Labs suggestive of mild dehydration. Per nursing, taking his nutritional supplement and orange juice. Encourage hydration q shift and check labs next lab draw. If labs show worsening dehydration, will consider iv fluids.   Advanced dementia Currently on namenda xr 28 mg daily. With his ckd stage 4, decrease this to 14 mg  daily and monitor. Provide supportive care, decline anticipated. Palliative care consult. D/c melatonin with him sleeping more during day time. Continue his antidepressants  Protein calorie malnutrition With ongoing weight loss and decreased po intake. His dementia is mainly contributing to this. Decline anticipated  Depression Currently on remeron 30 mg daily and venlafaxine 75 mg daily. Increase venlafaxine to 100 mg daily and monitor  S/p left hip fracture S/p IM nail. Recent fall and xray left femur and tibia/fibula negative for new fracture/ dislocation. Will get xray pelvis to assess for new fracture given worsening pain, limited participation in therapy and external rotation of LLE. Continue pain medication current regimen. Since refusing pain medication at times with other meds, start lidocaine patch to left hip. Continue to work with PT and OT as tolerated. Will need heel floaters with ankle support to prevent/ minimize external rotation. Continue aspirin bid for dvt prophylaxis.   Acute on ckd stage 4 With elevated bun and worsening creatinine, changes to namenda xr as above. Check bmp next lab.    Family/ staff Communication: reviewed care plan with patient's charge nurse.    Labs/tests ordered:  Bmp   Blanchie Serve, MD Internal Medicine [facility] [address] [place], [place] [address] Cell Phone (Monday-Friday 8 am - 5 pm): [phone] On Call: [phone] and follow prompts after 5 pm and on weekends Office Phone: [phone] Office Fax: [phone]

## 2016-08-16 ENCOUNTER — Non-Acute Institutional Stay (SKILLED_NURSING_FACILITY): Payer: Medicare Other | Admitting: Internal Medicine

## 2016-08-16 ENCOUNTER — Encounter: Payer: Self-pay | Admitting: *Deleted

## 2016-08-16 DIAGNOSIS — N184 Chronic kidney disease, stage 4 (severe): Secondary | ICD-10-CM

## 2016-08-16 DIAGNOSIS — E43 Unspecified severe protein-calorie malnutrition: Secondary | ICD-10-CM

## 2016-08-16 DIAGNOSIS — I10 Essential (primary) hypertension: Secondary | ICD-10-CM

## 2016-08-16 DIAGNOSIS — N179 Acute kidney failure, unspecified: Secondary | ICD-10-CM

## 2016-08-16 DIAGNOSIS — R627 Adult failure to thrive: Secondary | ICD-10-CM | POA: Diagnosis not present

## 2016-08-16 LAB — MAGNESIUM: MAGNESIUM: 3.9

## 2016-08-16 LAB — BASIC METABOLIC PANEL
BUN: 68 — AB (ref 4–21)
CREATININE: 4.4 — AB (ref 0.6–1.3)
Glucose: 79
Potassium: 5.3 (ref 3.4–5.3)
SODIUM: 145 (ref 137–147)

## 2016-08-16 NOTE — Progress Notes (Addendum)
Location:  [place] [facility] Service:  SNF (31) Provider:    Blanchie Serve, MD  Patient Care Team: Blanchie Serve, MD as PCP - General (Internal Medicine) Fleet Contras, MD as Consulting Physician (Nephrology) Ngetich, Nelda Bucks, NP as Nurse Practitioner (Family Medicine)  Extended Emergency Contact Information Primary Emergency Contact: Phillis Haggis [address] [address] of [place] Phone: [phone] Relation: Nephew Secondary Emergency Contact: Neita Goodnight [place] of [place] Mobile Phone: [phone] Relation: None  Code Status:  DNR  Goals of care: Advanced Directive information Advanced Directives 08/13/2016  Does Patient Have a Medical Advance Directive? Yes  Type of Advance Directive Out of facility DNR (pink MOST or yellow form);[place];Living will  Does patient want to make changes to medical advance directive? -  Copy of [place] in Chart? Yes  Pre-existing out of facility DNR order (yellow form or pink MOST form) Yellow form placed in chart (order not valid for inpatient use);Pink MOST form placed in chart (order not valid for inpatient use)     Chief Complaint  Patient presents with  . Acute Visit    worsening renal function, poor po intake    HPI:  Patient is a 83 y.o. male seen today for an acute visit for worsening renal function. He had blood work elevated bun and cr on 08/16/16. Per nursing he has not been eating. He takes his nutritional supplement drink of 120 cc tid. He has not been taking his medications. Facility had a care plan meeting and at present family would like full scope of treatment. Patient does not participate in HPI and ROS. He has advanced dementia.    Past Medical History:  Diagnosis Date  . Anemia   . Anxiety   . Back pain   . Cataract   . Chronic kidney disease 2013   rhabdo  . CKD (chronic kidney disease) stage 3, GFR 30-59 ml/min  08/29/2012   Creatinine 1.88 07/05/13(baseline 1.5-2.0) 02/11/14 creatinine 2.00 05/16/14 creatinine 2.31 07/18/14 creatinine 2.22     . CVA (cerebral infarction)   . Cystic disease of liver   . Dementia with behavioral disturbance 06/06/2012  . Depression 06/06/2012  . Diverticulosis   . Edema   . Gait disorder   . GERD (gastroesophageal reflux disease)   . H/O hiatal hernia 2008   repaired  . Hearing loss   . Hearing loss   . Hypertension   . Hyponatremia   . Inguinal hernia   . Keratosis   . Paresthesia   . Parkinsonism ([place])   . PVC (premature ventricular contraction)   . Sciatica   . Sciatica   . Stroke [facility]) 2010   no deficits  . Thyroid disease   . Vitamin D deficiency disease    Past Surgical History:  Procedure Laterality Date  . APPENDECTOMY    . ESOPHAGOGASTRODUODENOSCOPY  10/25/2011   Procedure: ESOPHAGOGASTRODUODENOSCOPY (EGD);  Surgeon: Winfield Cunas., MD;  Location: [facility] ENDOSCOPY;  Service: Endoscopy;  Laterality: N/A;  . HERNIA REPAIR    . INTRAMEDULLARY (IM) NAIL INTERTROCHANTERIC Left 07/24/2016   Procedure: INTRAMEDULLARY (IM) NAIL INTERTROCHANTRIC;  Surgeon: Nicholes Stairs, MD;  Location: [place];  Service: Orthopedics;  Laterality: Left;  . PROSTATE SURGERY     cancer    Allergies  Allergen Reactions  . Olanzapine Other (See Comments)    unknown  . Rivastigmine Nausea Only  Outpatient Encounter Prescriptions as of 08/16/2016  Medication Sig  . acetaminophen (TYLENOL) 325 MG tablet Take 650 mg by mouth every 4 (four) hours as needed.  Marland Kitchen acetaminophen (TYLENOL) 500 MG tablet Take 500 mg by mouth at bedtime. At 8PM  . amLODipine (NORVASC) 10 MG tablet Take 1 tablet (10 mg total) by mouth daily.  Marland Kitchen aspirin EC 81 MG tablet Take 81 mg by mouth 2 (two) times daily.   . bisacodyl (DULCOLAX) 10 MG suppository Place 10 mg rectally daily as needed for moderate constipation.  . calcium-vitamin D (OSCAL WITH D) 500-200 MG-UNIT tablet Take 1 tablet by mouth  daily with breakfast.  . Cholecalciferol (VITAMIN D3 PO) Take 1,000 Units by mouth daily.  . finasteride (PROSCAR) 5 MG tablet Take 5 mg by mouth daily.  . fluticasone (FLONASE) 50 MCG/ACT nasal spray Place 1 spray into both nostrils at bedtime.  Marland Kitchen HYDROcodone-acetaminophen (NORCO/VICODIN) 5-325 MG tablet Take 1 tablet by mouth 2 (two) times daily. 8AM and 8PM  . loteprednol (LOTEMAX) 0.5 % ophthalmic suspension Place 1 drop into the left eye daily. 9:45AM  . magnesium hydroxide (MILK OF MAGNESIA) 400 MG/5ML suspension Take 30 mLs by mouth daily.  . Melatonin 3 MG TABS Take 3 mg by mouth at bedtime.  . memantine (NAMENDA XR) 28 MG CP24 24 hr capsule Take 28 mg by mouth daily.  . mirtazapine (REMERON) 30 MG tablet Take 30 mg by mouth at bedtime.  . Nutritional Supplements (NUTRITIONAL SUPPLEMENT PO) Take 120 mLs by mouth 4 (four) times daily. Resource 2.0 8AM, 1PM, 4PM, 8PM  . Omega-3 Fatty Acids (FISH OIL) 1000 MG CAPS Take 2,000 mg by mouth daily.  . OXYGEN Inhale 1-2 L/min into the lungs continuous. To maintain O2 sats >90%  . Polyethyl Glycol-Propyl Glycol (SYSTANE) 0.4-0.3 % GEL ophthalmic gel Place 1 application into both eyes 3 (three) times daily. 8AM, 12PM, 8PM  . senna (SENOKOT) 8.6 MG TABS tablet Take 2 tablets by mouth daily.  . sodium chloride (MURO 128) 2 % ophthalmic solution Place 1 drop into the left eye 3 (three) times daily. 830AM, 1230 PM, 430PM  . sodium chloride (MURO 128) 5 % ophthalmic solution Place 1 drop into the left eye at bedtime.  . sodium fluoride (PREVIDENT 5000 PLUS) 1.1 % CREA dental cream Place 1 application onto teeth every evening.  . tamsulosin (FLOMAX) 0.4 MG CAPS capsule Take 0.4 mg by mouth daily with breakfast.  . venlafaxine XR (EFFEXOR-XR) 75 MG 24 hr capsule Take 75 mg by mouth daily with breakfast.   No facility-administered encounter medications on file as of 08/16/2016.     Review of Systems  Unable to perform ROS: Dementia  Constitutional:  Positive for appetite change.    Immunization History  Administered Date(s) Administered  . DTaP 10/31/2011  . Influenza Whole 12/20/2012  . Influenza-Unspecified 12/06/2013, 12/06/2014, 12/11/2015  . PPD Test 04/24/2011, 05/01/2011  . Pneumococcal-Unspecified 10/31/2011  . Tdap 10/24/2011   Pertinent  Health Maintenance Due  Topic Date Due  . PNA vac Low Risk Adult (2 of 2 - PCV13) 10/30/2012  . INFLUENZA VACCINE  09/29/2016   Fall Risk  08/10/2016 01/12/2016 09/12/2014 05/23/2014  Falls in the past year? Yes Yes Yes No  Number falls in past yr: 2 or more 1 1 -  Injury with Fall? Yes Yes No -  Risk Factor Category  High Fall Risk - - -  Risk for fall due to : History of fall(s);Impaired balance/gait;Impaired mobility - -  History of fall(s);Impaired balance/gait   Functional Status Survey:    Vitals:   08/16/16 1711  BP: (!) 158/102  Pulse: 92  Resp: 18  Temp: 97 F (36.1 C)  SpO2: 94%   There is no height or weight on file to calculate BMI. Physical Exam  Constitutional:  Frail, thin built male, in no acute distress  HENT:  Head: Normocephalic and atraumatic.  Dry mucus membrane  Eyes: Conjunctivae are normal. Pupils are equal, round, and reactive to light.  Neck: Normal range of motion. Neck supple.  Cardiovascular: Normal rate and regular rhythm.   Pulmonary/Chest: Effort normal and breath sounds normal. He has no wheezes. He has no rales.  Abdominal: Soft. Bowel sounds are normal. He exhibits no distension. There is no tenderness.  Musculoskeletal: He exhibits no edema.  generalized weakness, more to lower extremities left > right, limited ROM left hip with pain- passive movement. External rotation of left lower extremity. Has heel floaters. Left arm movement limited at his elbow with pain, dressing in place  Lymphadenopathy:    He has no cervical adenopathy.  Neurological: He is alert.  Not participating in conversation  Skin: Skin is warm and dry.  Easy  bruising  Psychiatric:  Flat affect, poor eye contact    Labs reviewed:  Recent Labs  07/27/16 0552  07/28/16 0532  07/29/16 0535 08/05/16 08/09/16 08/16/16  NA 144  < > 143  < > 145 142 140 145  K 4.5  < > 4.1  --  4.2 5.6* 5.0 5.3  CL 117*  --  116*  --  119*  --   --   --   CO2 20*  --  21*  --  19*  --   --   --   GLUCOSE 87  --  80  --  76  --   --   --   BUN 65*  < > 61*  < > 57* 54* 56* 68*  CREATININE 4.03*  < > 3.52*  < > 3.09* 3.0* 3.6* 4.4*  CALCIUM 7.5*  --  7.7*  --  7.8*  --   --   --   MG  --   --   --   --   --   --   --  3.9  < > = values in this interval not displayed.  Recent Labs  05/13/16 07/22/16 07/24/16 0526  AST 11* 11* 20  ALT 10 9* 21  ALKPHOS 81 84 95  BILITOT  --   --  0.5  PROT  --   --  8.0  ALBUMIN  --   --  4.1    Recent Labs  07/24/16 0526  07/27/16 0552  07/28/16 0532 07/29/16 07/29/16 0535 08/05/16  WBC 10.2  < > 17.6*  < > 15.0* 9.8 9.8 8.6  NEUTROABS 8.8*  --  15.0*  --   --   --  7.1  --   HGB 10.4*  < > 7.6*  < > 8.7*  --  9.0* 10.1*  HCT 32.1*  < > 23.5*  < > 26.9*  --  27.7* 31*  MCV 91.5  < > 91.1  --  90.0  --  90.2  --   PLT 230  < > 141*  < > 151  --  166 303  < > = values in this interval not displayed. Lab Results  Component Value Date   TSH 0.70 07/22/2016   Lab Results  Component Value Date   HGBA1C 5.3 06/12/2015   No results found for: CHOL, HDL, LDLCALC, LDLDIRECT, TRIG, CHOLHDL  08/16/16 glu 79, bun 68, cr 4.42, na 145, k 5.3, ca 8.9, co2 29   Significant Diagnostic Results in last 30 days:  Dg Chest 1 View  Result Date: 07/24/2016 CLINICAL DATA:  Left hip fracture.  History of hypertension. EXAM: CHEST 1 VIEW COMPARISON:  10/24/2011 FINDINGS: Shallow inspiration with linear atelectasis in the lung bases. Heart size and pulmonary vascularity are normal. No consolidation. No blunting of costophrenic angles. No pneumothorax. Calcified and tortuous aorta. Old bilateral rib fractures. IMPRESSION: Shallow  inspiration with atelectasis in the lung bases. Electronically Signed   By: Lucienne Capers M.D.   On: 07/24/2016 04:55   Dg Chest Port 1 View  Result Date: 07/26/2016 CLINICAL DATA:  Leukocytosis. EXAM: PORTABLE CHEST 1 VIEW COMPARISON:  07/24/2016 FINDINGS: Cardiac silhouette is normal in size. Aortic atherosclerosis is noted. Lung volumes remain diminished with persistent mild atelectasis in the lung bases. No lobar consolidation, edema, sizable pleural effusion, or pneumothorax is identified. Multiple old rib fractures are again noted. IMPRESSION: Low lung volumes with mild bibasilar atelectasis. Electronically Signed   By: Logan Bores M.D.   On: 07/26/2016 08:52   Dg Knee Complete 4 Views Left  Result Date: 07/24/2016 CLINICAL DATA:  Knee swelling, pain. EXAM: LEFT KNEE - COMPLETE 4+ VIEW COMPARISON:  None. FINDINGS: No acute fracture deformity or dislocation. Severe osteopenia without destructive bony lesions. No advanced degenerative change for age. Severe vascular calcifications. Soft tissue planes are otherwise unremarkable. IMPRESSION: No acute osseous process. Osteopenia which decreases sensitivity for acute nondisplaced fractures. Electronically Signed   By: Elon Alas M.D.   On: 07/24/2016 04:27   Dg C-arm 1-60 Min  Result Date: 07/24/2016 CLINICAL DATA:  Fracture repair in the left hip. FLUOROSCOPY TIME:  55.1 seconds. Images: 4 EXAM: DG C-ARM 61-120 MIN; OPERATIVE LEFT HIP WITH PELVIS COMPARISON:  Jul 24, 2016 FINDINGS: Rods and nails have been placed across the intertrochanteric fracture on the left. Hardware is in good position. There is a distal interlocking screw. IMPRESSION: Left hip fracture repair as above. Electronically Signed   By: Dorise Bullion III M.D   On: 07/24/2016 12:32   Dg Hip Operative Unilat W Or W/o Pelvis Left  Result Date: 07/24/2016 CLINICAL DATA:  Fracture repair in the left hip. FLUOROSCOPY TIME:  55.1 seconds. Images: 4 EXAM: DG C-ARM 61-120 MIN;  OPERATIVE LEFT HIP WITH PELVIS COMPARISON:  Jul 24, 2016 FINDINGS: Rods and nails have been placed across the intertrochanteric fracture on the left. Hardware is in good position. There is a distal interlocking screw. IMPRESSION: Left hip fracture repair as above. Electronically Signed   By: Dorise Bullion III M.D   On: 07/24/2016 12:32   Dg Hip Unilat With Pelvis 2-3 Views Left  Result Date: 07/24/2016 CLINICAL DATA:  Unwitnessed fall.  Left hip pain. EXAM: DG HIP (WITH OR WITHOUT PELVIS) 2-3V LEFT COMPARISON:  None. FINDINGS: There is an acute comminuted fracture of the inter trochanteric left hip with varus angulation. Displaced greater trochanteric fragment. No dislocation of the hip. Pelvis and right hip appear intact. Degenerative changes in the lower lumbar spine. Vascular calcifications. IMPRESSION: Acute comminuted inter trochanteric fracture of the left hip with varus angulation. Electronically Signed   By: Lucienne Capers M.D.   On: 07/24/2016 04:54    Assessment/Plan  Acute on chronic renal failure pre-renal. Will family wanting to pursue full scope  of treatment, will place him on D5 half normal saline @ 100 cc/hr x 1 liter. Family have been wanting to review and make changes to his MOST form to move towards comfort care. Continue to encourage po hydration. Poor overall prognosis. Pending palliative care consult.   Severe Protein calorie malnutrition With ongoing weight loss and decreased po intake. His dementia is mainly contributing to this. Decline anticipated  Failure to thrive Will need to move towards comfort care, HCPOA had care plan meeting this afternoon and to meet tomorrow as well. palliatiev care consult has been placed. Discontinue his norvasc, fonase, oscal with vitamin d, vitamin d, melatonin for now.    Family/ staff Communication: reviewed care plan with patient's charge nurse.    Labs/tests ordered:  Bmp in 3 days   Blanchie Serve, MD Internal  Medicine [facility] [place], [place] [address] Cell Phone (Monday-Friday 8 am - 5 pm): [phone] On Call: [phone] and follow prompts after 5 pm and on weekends Office Phone: [phone] Office Fax: [phone]  HTN Check BP bid for now with elevated BP reading. Has not been taking his amlodipine. D/c amlodipine and start clonidine patch 0.1 mg weekly.

## 2016-08-19 LAB — BASIC METABOLIC PANEL
BUN: 64 — AB (ref 4–21)
Creatinine: 4.3 — AB (ref 0.6–1.3)
GLUCOSE: 85
Potassium: 5.3 (ref 3.4–5.3)
SODIUM: 142 (ref 137–147)

## 2016-08-20 ENCOUNTER — Encounter: Payer: Self-pay | Admitting: Internal Medicine

## 2016-08-20 ENCOUNTER — Non-Acute Institutional Stay (SKILLED_NURSING_FACILITY): Payer: Medicare Other | Admitting: Internal Medicine

## 2016-08-20 DIAGNOSIS — F03C0 Unspecified dementia, severe, without behavioral disturbance, psychotic disturbance, mood disturbance, and anxiety: Secondary | ICD-10-CM

## 2016-08-20 DIAGNOSIS — N179 Acute kidney failure, unspecified: Secondary | ICD-10-CM | POA: Diagnosis not present

## 2016-08-20 DIAGNOSIS — E43 Unspecified severe protein-calorie malnutrition: Secondary | ICD-10-CM | POA: Diagnosis not present

## 2016-08-20 DIAGNOSIS — Z7189 Other specified counseling: Secondary | ICD-10-CM | POA: Diagnosis not present

## 2016-08-20 DIAGNOSIS — Z79899 Other long term (current) drug therapy: Secondary | ICD-10-CM | POA: Diagnosis not present

## 2016-08-20 DIAGNOSIS — R627 Adult failure to thrive: Secondary | ICD-10-CM | POA: Diagnosis not present

## 2016-08-20 DIAGNOSIS — F0390 Unspecified dementia without behavioral disturbance: Secondary | ICD-10-CM | POA: Diagnosis not present

## 2016-08-20 DIAGNOSIS — N184 Chronic kidney disease, stage 4 (severe): Secondary | ICD-10-CM

## 2016-08-20 DIAGNOSIS — I10 Essential (primary) hypertension: Secondary | ICD-10-CM | POA: Diagnosis not present

## 2016-08-20 DIAGNOSIS — S72002S Fracture of unspecified part of neck of left femur, sequela: Secondary | ICD-10-CM | POA: Diagnosis not present

## 2016-08-20 NOTE — Progress Notes (Signed)
Location:  [place] Room Number: 25 Place of Service:  SNF [phone]) Provider:    Blanchie Serve, MD  Patient Care Team: Blanchie Serve, MD as PCP - General (Internal Medicine) Fleet Contras, MD as Consulting Physician (Nephrology) Ngetich, Nelda Bucks, NP as Nurse Practitioner (Family Medicine)  Extended Emergency Contact Information Primary Emergency Contact: Phillis Haggis [address] [address] [place] of [place] Phone: [phone] Relation: Nephew Secondary Emergency Contact: Neita Goodnight [place] of [place] Mobile Phone: [phone] Relation: None  Code Status:  DNR  Goals of care: Advanced Directive information Advanced Directives 08/13/2016  Does Patient Have a Medical Advance Directive? Yes  Type of Advance Directive Out of facility DNR (pink MOST or yellow form);[place];Living will  Does patient want to make changes to medical advance directive? -  Copy of [place] in Chart? Yes  Pre-existing out of facility DNR order (yellow form or pink MOST form) Yellow form placed in chart (order not valid for inpatient use);Pink MOST form placed in chart (order not valid for inpatient use)     Chief Complaint  Patient presents with  . Acute Visit    ADVANCE CARE PLAN, ACUTE renal failure    HPI:  Patient is a 83 y.o. male seen today for an acute visit for worsening renal function and to review and update care plan with HCPOA. He has very poor oral intake and minimal fluid intake. He refuses to participate in activities. He is in pain with transfer/ repositioning. He has been refusing to take his oral medication. He is currently on iv fluids. Reviewed lab work showing worsening of renal function. He takes one third of his nutritional supplement drink of 120 cc tid. Patient does not participate in HPI and ROS. He has advanced dementia.    Past Medical History:  Diagnosis Date  . Anemia   .  Anxiety   . Back pain   . Cataract   . Chronic kidney disease 2013   rhabdo  . CKD (chronic kidney disease) stage 3, GFR 30-59 ml/min 08/29/2012   Creatinine 1.88 07/05/13(baseline 1.5-2.0) 02/11/14 creatinine 2.00 05/16/14 creatinine 2.31 07/18/14 creatinine 2.22     . CVA (cerebral infarction)   . Cystic disease of liver   . Dementia with behavioral disturbance 06/06/2012  . Depression 06/06/2012  . Diverticulosis   . Edema   . Gait disorder   . GERD (gastroesophageal reflux disease)   . H/O hiatal hernia 2008   repaired  . Hearing loss   . Hearing loss   . Hypertension   . Hyponatremia   . Inguinal hernia   . Keratosis   . Paresthesia   . Parkinsonism ([place])   . PVC (premature ventricular contraction)   . Sciatica   . Sciatica   . Stroke [facility]) 2010   no deficits  . Thyroid disease   . Vitamin D deficiency disease    Past Surgical History:  Procedure Laterality Date  . APPENDECTOMY    . ESOPHAGOGASTRODUODENOSCOPY  10/25/2011   Procedure: ESOPHAGOGASTRODUODENOSCOPY (EGD);  Surgeon: Winfield Cunas., MD;  Location: [facility] ENDOSCOPY;  Service: Endoscopy;  Laterality: N/A;  . HERNIA REPAIR    . INTRAMEDULLARY (IM) NAIL INTERTROCHANTERIC Left 07/24/2016   Procedure: INTRAMEDULLARY (IM) NAIL INTERTROCHANTRIC;  Surgeon: Nicholes Stairs, MD;  Location: [place];  Service: Orthopedics;  Laterality: Left;  . PROSTATE SURGERY     cancer  Allergies  Allergen Reactions  . Olanzapine Other (See Comments)    unknown  . Rivastigmine Nausea Only    Outpatient Encounter Prescriptions as of 08/20/2016  Medication Sig  . acetaminophen (TYLENOL) 325 MG tablet Take 650 mg by mouth every 4 (four) hours as needed.  Marland Kitchen acetaminophen (TYLENOL) 500 MG tablet Take 500 mg by mouth at bedtime. At 8PM  . aspirin EC 81 MG tablet Take 81 mg by mouth 2 (two) times daily.   . bisacodyl (DULCOLAX) 10 MG suppository Place 10 mg rectally daily as needed for moderate constipation.  . cloNIDine (CATAPRES -  DOSED IN MG/24 HR) 0.1 mg/24hr patch Place 0.1 mg onto the skin once a week.  . finasteride (PROSCAR) 5 MG tablet Take 5 mg by mouth daily.  Marland Kitchen HYDROcodone-acetaminophen (NORCO/VICODIN) 5-325 MG tablet Take 1 tablet by mouth every 6 (six) hours as needed.   . lidocaine (LIDODERM) 5 % Place 1 patch onto the skin daily. Remove & Discard patch within 12 hours or as directed by MD  . loteprednol (LOTEMAX) 0.5 % ophthalmic suspension Place 1 drop into the left eye daily. 9:45AM  . magnesium hydroxide (MILK OF MAGNESIA) 400 MG/5ML suspension Take 30 mLs by mouth daily.  . memantine (NAMENDA XR) 14 MG CP24 24 hr capsule Take 14 mg by mouth daily.  . mirtazapine (REMERON) 30 MG tablet Take 30 mg by mouth at bedtime.  . Nutritional Supplements (NUTRITIONAL SUPPLEMENT PO) Take 120 mLs by mouth 4 (four) times daily. Resource 2.0 8AM, 1PM, 4PM, 8PM  . Omega-3 Fatty Acids (FISH OIL) 1000 MG CAPS Take 2,000 mg by mouth daily.  . OXYGEN Inhale 2 L/min into the lungs continuous. To maintain O2 sats >90%   . Polyethyl Glycol-Propyl Glycol (SYSTANE) 0.4-0.3 % GEL ophthalmic gel Place 1 application into both eyes 3 (three) times daily. 8AM, 12PM, 8PM  . senna (SENOKOT) 8.6 MG TABS tablet Take 2 tablets by mouth daily.  . sodium chloride (MURO 128) 2 % ophthalmic solution Place 1 drop into the left eye 3 (three) times daily. 830AM, 1230 PM, 430PM  . sodium chloride (MURO 128) 5 % ophthalmic solution Place 1 drop into the left eye at bedtime.  . sodium fluoride (PREVIDENT 5000 PLUS) 1.1 % CREA dental cream Place 1 application onto teeth every evening.  . tamsulosin (FLOMAX) 0.4 MG CAPS capsule Take 0.4 mg by mouth daily with breakfast.  . UNABLE TO FIND Med Name: Fentanyl 12 mcg/hr patch. 1 application every 3 days  . venlafaxine (EFFEXOR) 100 MG tablet Take 100 mg by mouth daily.  . [DISCONTINUED] amLODipine (NORVASC) 10 MG tablet Take 1 tablet (10 mg total) by mouth daily.  . [DISCONTINUED] calcium-vitamin D (OSCAL  WITH D) 500-200 MG-UNIT tablet Take 1 tablet by mouth daily with breakfast.  . [DISCONTINUED] Cholecalciferol (VITAMIN D3 PO) Take 1,000 Units by mouth daily.  . [DISCONTINUED] fluticasone (FLONASE) 50 MCG/ACT nasal spray Place 1 spray into both nostrils at bedtime.  . [DISCONTINUED] Melatonin 3 MG TABS Take 3 mg by mouth at bedtime.  . [DISCONTINUED] memantine (NAMENDA XR) 28 MG CP24 24 hr capsule Take 28 mg by mouth daily.  . [DISCONTINUED] venlafaxine XR (EFFEXOR-XR) 75 MG 24 hr capsule Take 75 mg by mouth daily with breakfast.   No facility-administered encounter medications on file as of 08/20/2016.     Review of Systems  Unable to perform ROS: Dementia (obtained from nursing)  Constitutional: Positive for appetite change. Negative for fever.  HENT: Negative for  rhinorrhea and sneezing.   Respiratory: Negative for cough and shortness of breath.        He is on oxygen by nasal canula  Gastrointestinal: Negative for blood in stool, diarrhea and vomiting.  Genitourinary: Positive for decreased urine volume.  Musculoskeletal:       Has ongoing pain mainly to left hip and leg  Skin: Negative for rash and wound.  Neurological: Positive for weakness. Negative for seizures.  Psychiatric/Behavioral: Positive for confusion and decreased concentration.    Immunization History  Administered Date(s) Administered  . DTaP 10/31/2011  . Influenza Whole 12/20/2012  . Influenza-Unspecified 12/06/2013, 12/06/2014, 12/11/2015  . PPD Test 04/24/2011, 05/01/2011  . Pneumococcal-Unspecified 10/31/2011  . Tdap 10/24/2011   Pertinent  Health Maintenance Due  Topic Date Due  . PNA vac Low Risk Adult (2 of 2 - PCV13) 10/30/2012  . INFLUENZA VACCINE  09/29/2016   Fall Risk  08/10/2016 01/12/2016 09/12/2014 05/23/2014  Falls in the past year? Yes Yes Yes No  Number falls in past yr: 2 or more 1 1 -  Injury with Fall? Yes Yes No -  Risk Factor Category  High Fall Risk - - -  Risk for fall due to :  History of fall(s);Impaired balance/gait;Impaired mobility - - History of fall(s);Impaired balance/gait   Functional Status Survey:    Vitals:   08/20/16 1008  BP: 139/90  Pulse: 76  Resp: 20  Temp: (!) 96.8 F (36 C)  TempSrc: Oral  SpO2: 92%  Weight: 128 lb 3.2 oz (58.2 kg)  Height: 5' (1.524 m)   Body mass index is 25.04 kg/m. Physical Exam  Constitutional:  Frail, thin built male, in no acute distress  HENT:  Head: Normocephalic and atraumatic.  Dry mucus membrane  Eyes: Conjunctivae are normal. Pupils are equal, round, and reactive to light.  Neck: Normal range of motion. Neck supple.  Cardiovascular: Normal rate and regular rhythm.   Pulmonary/Chest: Effort normal. He has no wheezes. He has no rales.  Poor air movement  Abdominal: Soft. Bowel sounds are normal. He exhibits no distension. There is no tenderness. There is no guarding.  Musculoskeletal: He exhibits no edema.  generalized weakness, more to lower extremities left > right, limited ROM left hip with pain- passive movement. External rotation of left lower extremity. Has heel floaters. Left arm movement limited at his elbow with pain, dressing in place  Lymphadenopathy:    He has no cervical adenopathy.  Neurological: He is alert.  Not participating in conversation  Skin: Skin is warm and dry.  Easy bruising  Psychiatric:  Flat affect, poor eye contact    Labs reviewed:  Recent Labs  07/27/16 0552  07/28/16 0532  07/29/16 0535  08/09/16 08/16/16 08/19/16  NA 144  < > 143  < > 145  < > 140 145 142  K 4.5  < > 4.1  --  4.2  < > 5.0 5.3 5.3  CL 117*  --  116*  --  119*  --   --   --   --   CO2 20*  --  21*  --  19*  --   --   --   --   GLUCOSE 87  --  80  --  76  --   --   --   --   BUN 65*  < > 61*  < > 57*  < > 56* 68* 64*  CREATININE 4.03*  < > 3.52*  < > 3.09*  < >  3.6* 4.4* 4.3*  CALCIUM 7.5*  --  7.7*  --  7.8*  --   --   --   --   MG  --   --   --   --   --   --   --  3.9  --   < > = values  in this interval not displayed.  Recent Labs  05/13/16 07/22/16 07/24/16 0526  AST 11* 11* 20  ALT 10 9* 21  ALKPHOS 81 84 95  BILITOT  --   --  0.5  PROT  --   --  8.0  ALBUMIN  --   --  4.1    Recent Labs  07/24/16 0526  07/27/16 0552  07/28/16 0532 07/29/16 07/29/16 0535 08/05/16  WBC 10.2  < > 17.6*  < > 15.0* 9.8 9.8 8.6  NEUTROABS 8.8*  --  15.0*  --   --   --  7.1  --   HGB 10.4*  < > 7.6*  < > 8.7*  --  9.0* 10.1*  HCT 32.1*  < > 23.5*  < > 26.9*  --  27.7* 31*  MCV 91.5  < > 91.1  --  90.0  --  90.2  --   PLT 230  < > 141*  < > 151  --  166 303  < > = values in this interval not displayed. Lab Results  Component Value Date   TSH 0.70 07/22/2016   Lab Results  Component Value Date   HGBA1C 5.3 06/12/2015   No results found for: CHOL, HDL, LDLCALC, LDLDIRECT, TRIG, CHOLHDL  08/16/16 glu 79, bun 68, cr 4.42, na 145, k 5.3, ca 8.9, co2 29   Significant Diagnostic Results in last 30 days:  Dg Chest 1 View  Result Date: 07/24/2016 CLINICAL DATA:  Left hip fracture.  History of hypertension. EXAM: CHEST 1 VIEW COMPARISON:  10/24/2011 FINDINGS: Shallow inspiration with linear atelectasis in the lung bases. Heart size and pulmonary vascularity are normal. No consolidation. No blunting of costophrenic angles. No pneumothorax. Calcified and tortuous aorta. Old bilateral rib fractures. IMPRESSION: Shallow inspiration with atelectasis in the lung bases. Electronically Signed   By: Lucienne Capers M.D.   On: 07/24/2016 04:55   Dg Chest Port 1 View  Result Date: 07/26/2016 CLINICAL DATA:  Leukocytosis. EXAM: PORTABLE CHEST 1 VIEW COMPARISON:  07/24/2016 FINDINGS: Cardiac silhouette is normal in size. Aortic atherosclerosis is noted. Lung volumes remain diminished with persistent mild atelectasis in the lung bases. No lobar consolidation, edema, sizable pleural effusion, or pneumothorax is identified. Multiple old rib fractures are again noted. IMPRESSION: Low lung volumes with  mild bibasilar atelectasis. Electronically Signed   By: Logan Bores M.D.   On: 07/26/2016 08:52   Dg Knee Complete 4 Views Left  Result Date: 07/24/2016 CLINICAL DATA:  Knee swelling, pain. EXAM: LEFT KNEE - COMPLETE 4+ VIEW COMPARISON:  None. FINDINGS: No acute fracture deformity or dislocation. Severe osteopenia without destructive bony lesions. No advanced degenerative change for age. Severe vascular calcifications. Soft tissue planes are otherwise unremarkable. IMPRESSION: No acute osseous process. Osteopenia which decreases sensitivity for acute nondisplaced fractures. Electronically Signed   By: Elon Alas M.D.   On: 07/24/2016 04:27   Dg C-arm 1-60 Min  Result Date: 07/24/2016 CLINICAL DATA:  Fracture repair in the left hip. FLUOROSCOPY TIME:  55.1 seconds. Images: 4 EXAM: DG C-ARM 61-120 MIN; OPERATIVE LEFT HIP WITH PELVIS COMPARISON:  Jul 24, 2016 FINDINGS: Rods and  nails have been placed across the intertrochanteric fracture on the left. Hardware is in good position. There is a distal interlocking screw. IMPRESSION: Left hip fracture repair as above. Electronically Signed   By: Dorise Bullion III M.D   On: 07/24/2016 12:32   Dg Hip Operative Unilat W Or W/o Pelvis Left  Result Date: 07/24/2016 CLINICAL DATA:  Fracture repair in the left hip. FLUOROSCOPY TIME:  55.1 seconds. Images: 4 EXAM: DG C-ARM 61-120 MIN; OPERATIVE LEFT HIP WITH PELVIS COMPARISON:  Jul 24, 2016 FINDINGS: Rods and nails have been placed across the intertrochanteric fracture on the left. Hardware is in good position. There is a distal interlocking screw. IMPRESSION: Left hip fracture repair as above. Electronically Signed   By: Dorise Bullion III M.D   On: 07/24/2016 12:32   Dg Hip Unilat With Pelvis 2-3 Views Left  Result Date: 07/24/2016 CLINICAL DATA:  Unwitnessed fall.  Left hip pain. EXAM: DG HIP (WITH OR WITHOUT PELVIS) 2-3V LEFT COMPARISON:  None. FINDINGS: There is an acute comminuted fracture of the  inter trochanteric left hip with varus angulation. Displaced greater trochanteric fragment. No dislocation of the hip. Pelvis and right hip appear intact. Degenerative changes in the lower lumbar spine. Vascular calcifications. IMPRESSION: Acute comminuted inter trochanteric fracture of the left hip with varus angulation. Electronically Signed   By: Lucienne Capers M.D.   On: 07/24/2016 04:54    Assessment/Plan  Failure to thrive Ongoing with decline anticipated. Had care plan meeting with his nephew/ HCPOA. Will get hospice consult with [facility].  Advance care plan meeting With HCPOA/ nephew. Reviewed goals of care. Plan is to focus on comfort care, no further hospitalization, no feeding tube, no foley catheter, no iv fluids, no antibiotics. MOST form filled out and updated. Charge nurse present during the care plan. MOST form signed by HCPOA. Form placed in chart.   Acute on chronic renal failure Worsening despite iv fluids with his poor po intake. Also has decreased urine output. Likely has pre-renal and intra-renal component. Has ckd stage 4 at baseline. Will discontinue iv fluids. Encourage fluid intake by mouth as tolerated. Poor prognosis. Hospice consult after discussion on care plan with HCPOA.   Severe Protein calorie malnutrition With ongoing weight loss and decreased po intake. His dementia is worsening this along with his co-morbidities. Decline anticipated  Advanced dementia D/c namenda xr, this medication is not providing medical benefit at present given his advanced stage and refusal of medication  Medication review With him refusing po medications mostly, d/c his tylenol, finasteride, remeron, namenda xr, norco, tamsulosin and effexor.   HTN Stable BP on clonidine patch, continue this  S/p left hip fracture S/p fracture and s/p surgical repair. D/c norco with pt refusing to take this. Continue lidocaine patch. Start morphine 20 mg/ml give 5 ml every 8 hours as needed for pain  and monitor. Hospice consult.     Family/ staff Communication: reviewed care plan with patient's HCPOA and charge nurse.    Labs/tests ordered:  none  I have spent greater than 50 minutes for this encounter which includes reviewing patientrecords, addressing above mentioned concerns, reviewing care plan with patient's HCPOA, answering HCPOA's concerns and counseling. Charge nurse for patient and [facility] staff present during the visit.  Blanchie Serve, MD Internal Medicine [facility] [address] [place], [place] [address] Cell Phone (Monday-Friday 8 am - 5 pm): [phone] On Call: [phone] and follow prompts after 5 pm and on weekends  Office Phone: [phone] Office Fax: [phone]  HTN Check BP bid for now with elevated BP reading. Has not been taking his amlodipine. D/c amlodipine and start clonidine patch 0.1 mg weekly.

## 2016-08-22 DIAGNOSIS — H269 Unspecified cataract: Secondary | ICD-10-CM | POA: Diagnosis not present

## 2016-08-22 DIAGNOSIS — I672 Cerebral atherosclerosis: Secondary | ICD-10-CM | POA: Diagnosis not present

## 2016-08-22 DIAGNOSIS — I679 Cerebrovascular disease, unspecified: Secondary | ICD-10-CM | POA: Diagnosis not present

## 2016-08-22 DIAGNOSIS — N184 Chronic kidney disease, stage 4 (severe): Secondary | ICD-10-CM | POA: Diagnosis not present

## 2016-08-22 DIAGNOSIS — E46 Unspecified protein-calorie malnutrition: Secondary | ICD-10-CM | POA: Diagnosis not present

## 2016-08-22 DIAGNOSIS — I10 Essential (primary) hypertension: Secondary | ICD-10-CM | POA: Diagnosis not present

## 2016-08-23 DIAGNOSIS — N184 Chronic kidney disease, stage 4 (severe): Secondary | ICD-10-CM | POA: Diagnosis not present

## 2016-08-23 DIAGNOSIS — I672 Cerebral atherosclerosis: Secondary | ICD-10-CM | POA: Diagnosis not present

## 2016-08-23 DIAGNOSIS — H269 Unspecified cataract: Secondary | ICD-10-CM | POA: Diagnosis not present

## 2016-08-23 DIAGNOSIS — I10 Essential (primary) hypertension: Secondary | ICD-10-CM | POA: Diagnosis not present

## 2016-08-23 DIAGNOSIS — I679 Cerebrovascular disease, unspecified: Secondary | ICD-10-CM | POA: Diagnosis not present

## 2016-08-23 DIAGNOSIS — E46 Unspecified protein-calorie malnutrition: Secondary | ICD-10-CM | POA: Diagnosis not present

## 2016-08-26 DIAGNOSIS — E46 Unspecified protein-calorie malnutrition: Secondary | ICD-10-CM | POA: Diagnosis not present

## 2016-08-26 DIAGNOSIS — H269 Unspecified cataract: Secondary | ICD-10-CM | POA: Diagnosis not present

## 2016-08-26 DIAGNOSIS — N184 Chronic kidney disease, stage 4 (severe): Secondary | ICD-10-CM | POA: Diagnosis not present

## 2016-08-26 DIAGNOSIS — I672 Cerebral atherosclerosis: Secondary | ICD-10-CM | POA: Diagnosis not present

## 2016-08-26 DIAGNOSIS — I10 Essential (primary) hypertension: Secondary | ICD-10-CM | POA: Diagnosis not present

## 2016-08-26 DIAGNOSIS — I679 Cerebrovascular disease, unspecified: Secondary | ICD-10-CM | POA: Diagnosis not present

## 2016-08-28 DIAGNOSIS — I679 Cerebrovascular disease, unspecified: Secondary | ICD-10-CM | POA: Diagnosis not present

## 2016-08-28 DIAGNOSIS — I672 Cerebral atherosclerosis: Secondary | ICD-10-CM | POA: Diagnosis not present

## 2016-08-28 DIAGNOSIS — H269 Unspecified cataract: Secondary | ICD-10-CM | POA: Diagnosis not present

## 2016-08-28 DIAGNOSIS — I10 Essential (primary) hypertension: Secondary | ICD-10-CM | POA: Diagnosis not present

## 2016-08-28 DIAGNOSIS — N184 Chronic kidney disease, stage 4 (severe): Secondary | ICD-10-CM | POA: Diagnosis not present

## 2016-08-28 DIAGNOSIS — E46 Unspecified protein-calorie malnutrition: Secondary | ICD-10-CM | POA: Diagnosis not present

## 2016-08-29 DIAGNOSIS — E46 Unspecified protein-calorie malnutrition: Secondary | ICD-10-CM | POA: Diagnosis not present

## 2016-08-29 DIAGNOSIS — H269 Unspecified cataract: Secondary | ICD-10-CM | POA: Diagnosis not present

## 2016-08-29 DIAGNOSIS — N184 Chronic kidney disease, stage 4 (severe): Secondary | ICD-10-CM | POA: Diagnosis not present

## 2016-08-29 DIAGNOSIS — I10 Essential (primary) hypertension: Secondary | ICD-10-CM | POA: Diagnosis not present

## 2016-08-29 DIAGNOSIS — I679 Cerebrovascular disease, unspecified: Secondary | ICD-10-CM | POA: Diagnosis not present

## 2016-08-29 DIAGNOSIS — I672 Cerebral atherosclerosis: Secondary | ICD-10-CM | POA: Diagnosis not present

## 2016-08-30 DIAGNOSIS — I672 Cerebral atherosclerosis: Secondary | ICD-10-CM | POA: Diagnosis not present

## 2016-08-30 DIAGNOSIS — N184 Chronic kidney disease, stage 4 (severe): Secondary | ICD-10-CM | POA: Diagnosis not present

## 2016-08-30 DIAGNOSIS — I10 Essential (primary) hypertension: Secondary | ICD-10-CM | POA: Diagnosis not present

## 2016-08-30 DIAGNOSIS — I679 Cerebrovascular disease, unspecified: Secondary | ICD-10-CM | POA: Diagnosis not present

## 2016-08-30 DIAGNOSIS — E46 Unspecified protein-calorie malnutrition: Secondary | ICD-10-CM | POA: Diagnosis not present

## 2016-08-30 DIAGNOSIS — H269 Unspecified cataract: Secondary | ICD-10-CM | POA: Diagnosis not present

## 2016-09-29 DEATH — deceased

## 2017-01-08 IMAGING — CT CT HEAD W/O CM
4 of 7 series · 14 of 47 positions shown, 16 images · non-contrast
Comparison: None.

CLINICAL DATA: Pt brought from [facility] [REDACTED] via
EMS for unwitnessed fall. Pt has hx dementia but is at baseline
neuro status, PERRLA. Pt is not on blood thinners. Two inch gash to
right forehead and skin tear to right upper arm

EXAM:
CT HEAD WITHOUT CONTRAST
CT CERVICAL SPINE WITHOUT CONTRAST
TECHNIQUE: Multidetector CT imaging of the head and cervical spine was
performed following the standard protocol without intravenous
contrast. Multiplanar CT image reconstructions of the cervical spine
were also generated.

[Series 2: head w/o · axial · non-contrast · 0.43mm/px · z∈[-272,-172]mm · 5 of 32 slices shown, 7 images]
[im 6/32  brain]
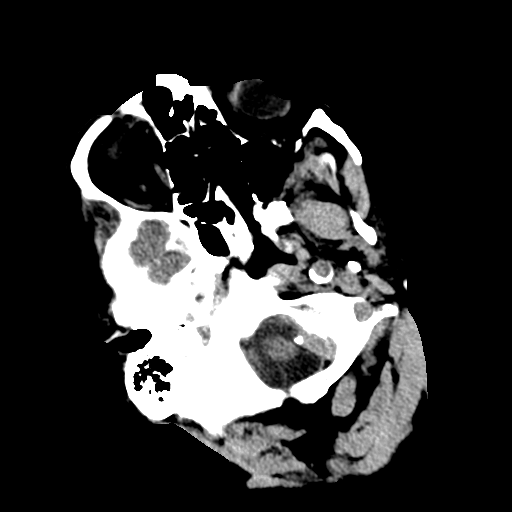
[im 6/32  bone]
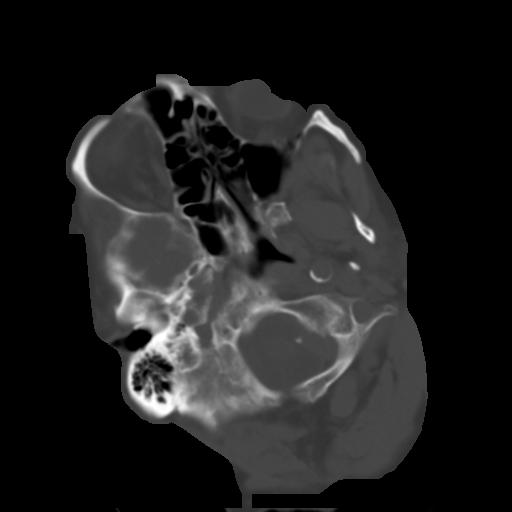
[im 11/32  brain]
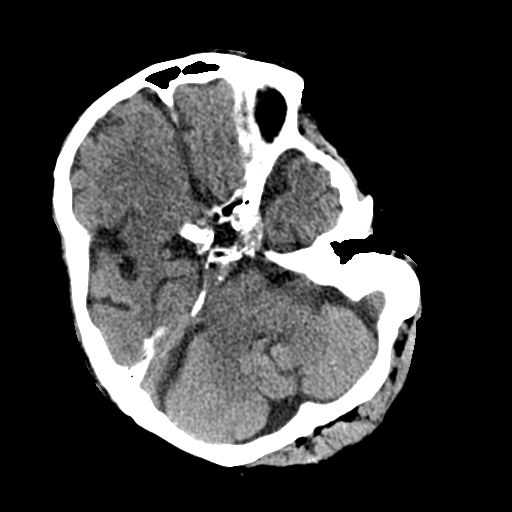
[im 16/32  brain]
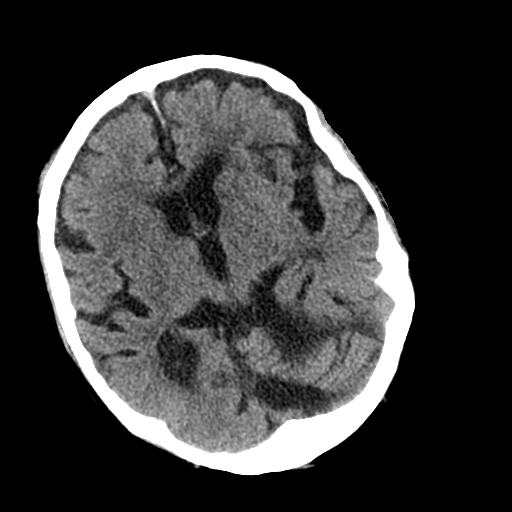
[im 21/32  brain]
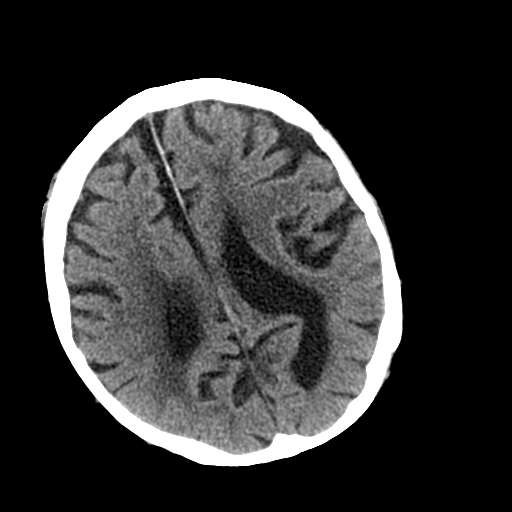
[im 26/32  brain]
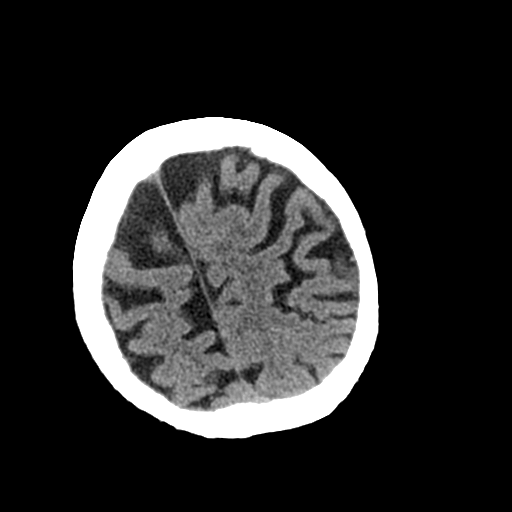
[im 26/32  bone]
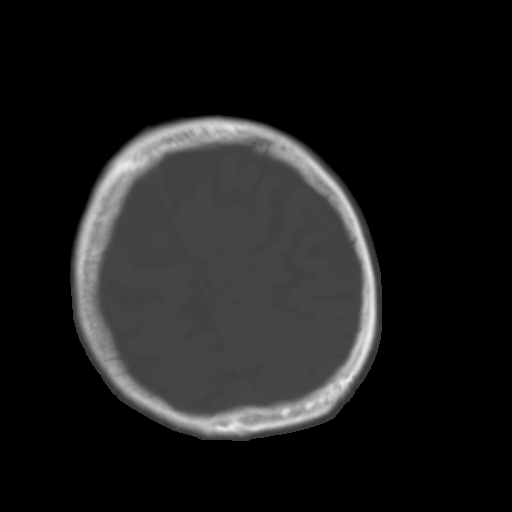

[Series 3: bone windows · axial · 0.43mm/px · z∈[-277,-212]mm · 4 of 32 slices shown]
[im 5/32  bone]
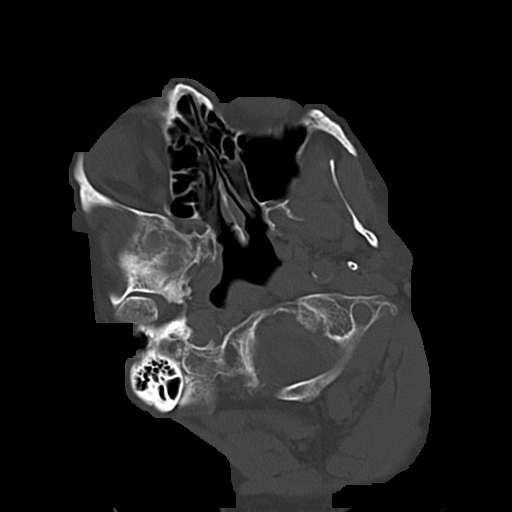
[im 9/32  bone]
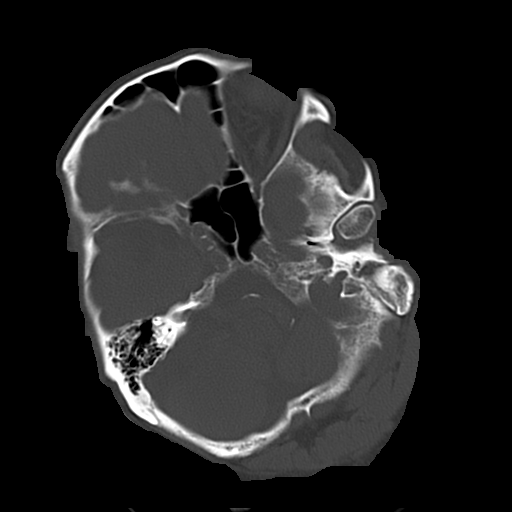
[im 14/32  bone]
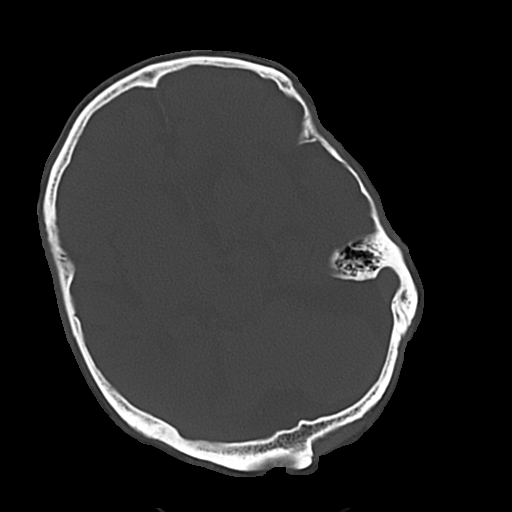
[im 18/32  bone]
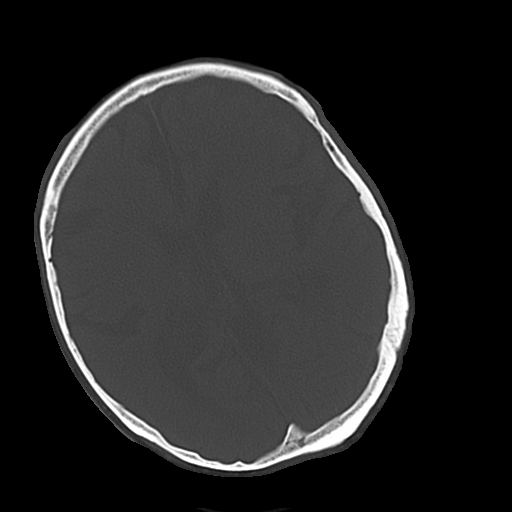

[Series 5: coronal · coronal · 0.31mm/px · 3 of 74 slices shown]
[im 28/74  brain]
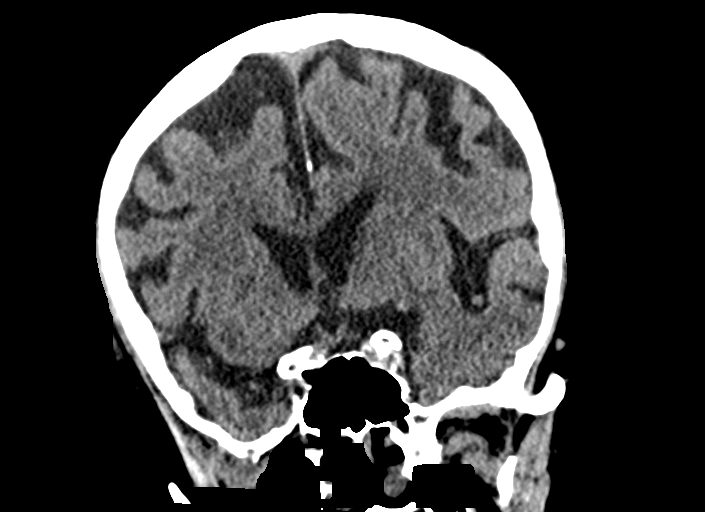
[im 37/74  brain]
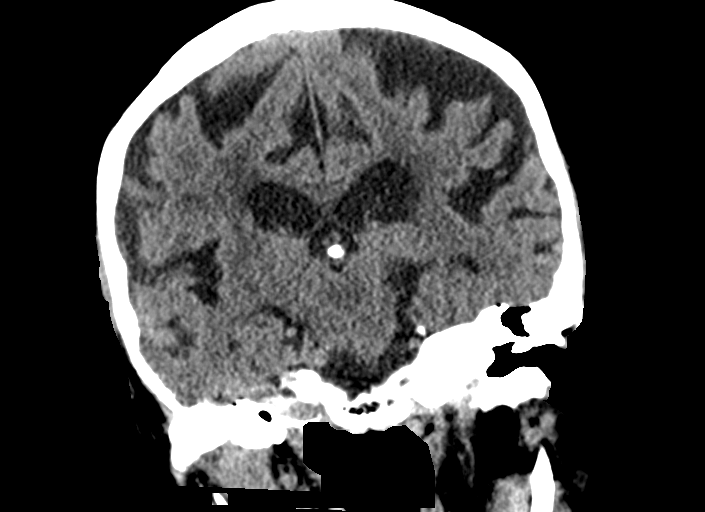
[im 46/74  brain]
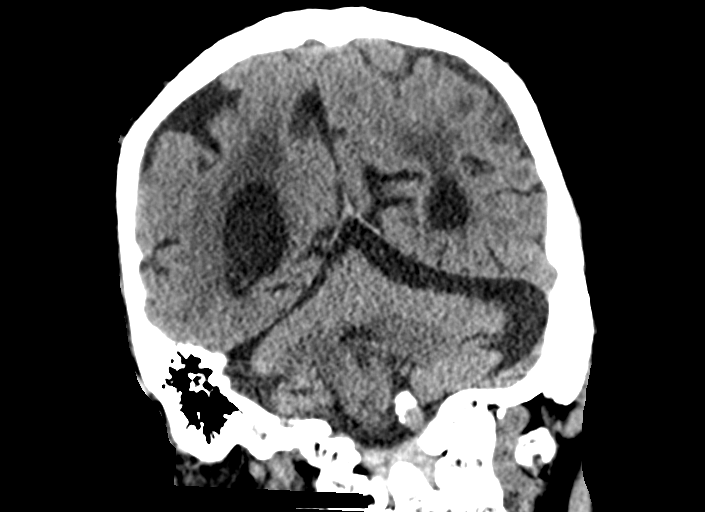

[Series 6: sagittal · sagittal · 0.31mm/px · 2 of 74 slices shown]
[im 25/74  brain]
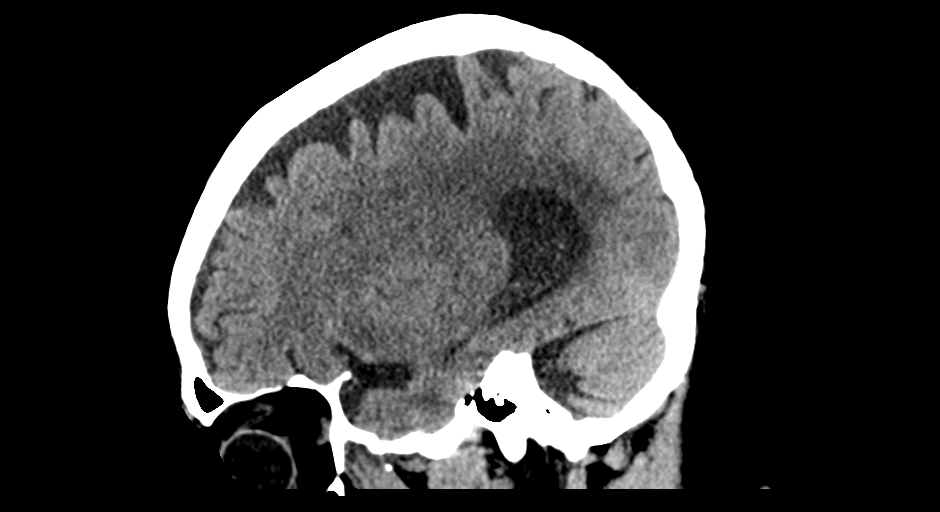
[im 49/74  brain]
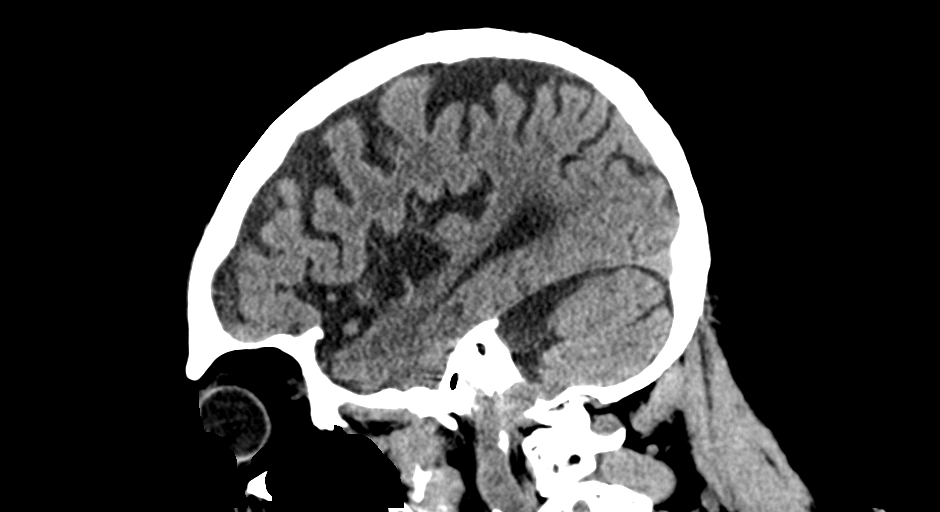

[14 of 47 positions shown; findings below may reference images not displayed]

FINDINGS: CT HEAD FINDINGS

Brain: No intracranial hemorrhage. No parenchymal contusion. No
midline shift or mass effect. Basilar cisterns are patent. No skull
base fracture. No fluid in the paranasal sinuses or mastoid air
cells. Orbits are normal.

There is generalized cortical atrophy and proportional ventricular
dilatation.

Vascular: No hyperdense vessel or unexpected calcification.

Skull: No fracture. Small scalp hematoma superficial to the RIGHT
frontal bone.

Sinuses/Orbits: No acute finding.

Other: None.

CT CERVICAL SPINE FINDINGS

Alignment: Normal alignment of the vertebral bodies. No prevertebral
soft tissue swelling.

Skull base and vertebrae: Craniocervical junction is intact. No
acute loss of vertebral body height or disc height. Normal facet
articulation.

Soft tissues and spinal canal: No epidural or paraspinal hematoma.

Disc levels: Multiple levels of endplate spurring and disc space
narrowing. Multiple levels of uncovertebral and facet hypertrophy.
Extensive calcification in the nuchal ligament posterior to the
lower cervical spine elements

Upper chest: Upper lungs are clear

Other: None
IMPRESSION: 1. No intracranial trauma.
2. Small scalp hematoma.
3. Extensive atrophy and mild white matter microvascular disease
4. No evidence cervical spine fracture. Patient is tilted RIGHTwaard
which makes evaluation difficult.
5. Multilevel disc osteophytic disease and facet hypertrophy.
